# Patient Record
Sex: Male | Born: 1940
Health system: Southern US, Community
[De-identification: ages and names within clinical notes are randomized; demographics above are authoritative.]

## PROBLEM LIST (undated history)

## (undated) DIAGNOSIS — D709 Neutropenia, unspecified: Secondary | ICD-10-CM

## (undated) DIAGNOSIS — Z87898 Personal history of other specified conditions: Secondary | ICD-10-CM

## (undated) DIAGNOSIS — I10 Essential (primary) hypertension: Secondary | ICD-10-CM

## (undated) DIAGNOSIS — L409 Psoriasis, unspecified: Secondary | ICD-10-CM

## (undated) DIAGNOSIS — E079 Disorder of thyroid, unspecified: Secondary | ICD-10-CM

## (undated) DIAGNOSIS — D4620 Refractory anemia with excess of blasts, unspecified: Principal | ICD-10-CM

## (undated) DIAGNOSIS — E785 Hyperlipidemia, unspecified: Secondary | ICD-10-CM

## (undated) DIAGNOSIS — M1990 Unspecified osteoarthritis, unspecified site: Secondary | ICD-10-CM

## (undated) DIAGNOSIS — I639 Cerebral infarction, unspecified: Secondary | ICD-10-CM

## (undated) DIAGNOSIS — K219 Gastro-esophageal reflux disease without esophagitis: Secondary | ICD-10-CM

## (undated) HISTORY — DX: Cerebral infarction, unspecified: I63.9

## (undated) HISTORY — PX: CLEFT PALATE REPAIR: SUR1165

## (undated) HISTORY — DX: Hyperlipidemia, unspecified: E78.5

## (undated) HISTORY — DX: Psoriasis, unspecified: L40.9

## (undated) HISTORY — DX: Refractory anemia with excess of blasts, unspecified: D46.20

## (undated) HISTORY — DX: Neutropenia, unspecified: D70.9

## (undated) HISTORY — DX: Essential (primary) hypertension: I10

## (undated) HISTORY — DX: Unspecified osteoarthritis, unspecified site: M19.90

## (undated) HISTORY — DX: Gastro-esophageal reflux disease without esophagitis: K21.9

## (undated) HISTORY — DX: Personal history of other specified conditions: Z87.898

## (undated) HISTORY — DX: Disorder of thyroid, unspecified: E07.9

---

## 1994-03-27 DIAGNOSIS — I639 Cerebral infarction, unspecified: Secondary | ICD-10-CM

## 1994-03-27 HISTORY — DX: Cerebral infarction, unspecified: I63.9

## 1998-05-14 ENCOUNTER — Ambulatory Visit (HOSPITAL_COMMUNITY): Admission: RE | Admit: 1998-05-14 | Discharge: 1998-05-14 | Payer: Self-pay | Admitting: Gastroenterology

## 1998-05-14 ENCOUNTER — Encounter: Payer: Self-pay | Admitting: Gastroenterology

## 2001-05-03 ENCOUNTER — Encounter: Payer: Self-pay | Admitting: Endocrinology

## 2001-05-03 ENCOUNTER — Ambulatory Visit (HOSPITAL_COMMUNITY): Admission: RE | Admit: 2001-05-03 | Discharge: 2001-05-03 | Payer: Self-pay | Admitting: Endocrinology

## 2002-05-20 ENCOUNTER — Encounter: Payer: Self-pay | Admitting: Internal Medicine

## 2002-05-20 ENCOUNTER — Ambulatory Visit (HOSPITAL_COMMUNITY): Admission: RE | Admit: 2002-05-20 | Discharge: 2002-05-20 | Payer: Self-pay | Admitting: Internal Medicine

## 2003-08-27 ENCOUNTER — Ambulatory Visit (HOSPITAL_COMMUNITY): Admission: RE | Admit: 2003-08-27 | Discharge: 2003-08-27 | Payer: Self-pay | Admitting: Internal Medicine

## 2004-02-12 ENCOUNTER — Ambulatory Visit: Payer: Self-pay | Admitting: Internal Medicine

## 2004-04-05 ENCOUNTER — Ambulatory Visit: Payer: Self-pay | Admitting: Internal Medicine

## 2004-04-27 ENCOUNTER — Ambulatory Visit: Payer: Self-pay | Admitting: Internal Medicine

## 2004-05-11 ENCOUNTER — Ambulatory Visit: Payer: Self-pay | Admitting: Internal Medicine

## 2004-05-30 ENCOUNTER — Ambulatory Visit: Payer: Self-pay | Admitting: Internal Medicine

## 2004-06-13 ENCOUNTER — Ambulatory Visit: Payer: Self-pay | Admitting: Internal Medicine

## 2004-08-11 ENCOUNTER — Ambulatory Visit: Payer: Self-pay | Admitting: Internal Medicine

## 2004-09-22 ENCOUNTER — Ambulatory Visit: Payer: Self-pay | Admitting: Family Medicine

## 2004-10-04 ENCOUNTER — Ambulatory Visit: Payer: Self-pay | Admitting: Internal Medicine

## 2004-11-16 ENCOUNTER — Ambulatory Visit: Payer: Self-pay | Admitting: Internal Medicine

## 2005-02-02 ENCOUNTER — Ambulatory Visit: Payer: Self-pay | Admitting: Internal Medicine

## 2005-03-08 ENCOUNTER — Ambulatory Visit: Payer: Self-pay | Admitting: Internal Medicine

## 2005-04-25 ENCOUNTER — Ambulatory Visit: Payer: Self-pay | Admitting: Internal Medicine

## 2005-06-20 ENCOUNTER — Ambulatory Visit: Payer: Self-pay | Admitting: Internal Medicine

## 2005-10-03 ENCOUNTER — Ambulatory Visit: Payer: Self-pay | Admitting: Internal Medicine

## 2005-11-10 ENCOUNTER — Ambulatory Visit: Payer: Self-pay | Admitting: Internal Medicine

## 2005-12-05 ENCOUNTER — Ambulatory Visit: Payer: Self-pay | Admitting: Internal Medicine

## 2006-01-05 ENCOUNTER — Ambulatory Visit: Payer: Self-pay | Admitting: Internal Medicine

## 2006-02-07 ENCOUNTER — Ambulatory Visit: Payer: Self-pay | Admitting: Internal Medicine

## 2006-03-14 ENCOUNTER — Ambulatory Visit: Payer: Self-pay | Admitting: Internal Medicine

## 2006-04-03 ENCOUNTER — Ambulatory Visit: Payer: Self-pay | Admitting: Internal Medicine

## 2006-04-03 LAB — CONVERTED CEMR LAB
ALT: 30 units/L (ref 0–40)
AST: 31 units/L (ref 0–37)
Albumin: 3.7 g/dL (ref 3.5–5.2)
Alkaline Phosphatase: 70 units/L (ref 39–117)
BUN: 11 mg/dL (ref 6–23)
Basophils Absolute: 0 10*3/uL (ref 0.0–0.1)
Basophils Relative: 0.6 % (ref 0.0–1.0)
Bilirubin Urine: NEGATIVE
CO2: 32 meq/L (ref 19–32)
Calcium: 9.3 mg/dL (ref 8.4–10.5)
Chloride: 103 meq/L (ref 96–112)
Chol/HDL Ratio, serum: 3.6
Cholesterol: 129 mg/dL (ref 0–200)
Creatinine, Ser: 1.3 mg/dL (ref 0.4–1.5)
Eosinophil percent: 2.1 % (ref 0.0–5.0)
GFR calc non Af Amer: 59 mL/min
Glomerular Filtration Rate, Af Am: 71 mL/min/{1.73_m2}
Glucose, Bld: 113 mg/dL — ABNORMAL HIGH (ref 70–99)
HCT: 47.1 % (ref 39.0–52.0)
HDL: 35.4 mg/dL — ABNORMAL LOW (ref >39.0)
Hemoglobin, Urine: NEGATIVE
Hemoglobin: 15.7 g/dL (ref 13.0–17.0)
Ketones, ur: NEGATIVE mg/dL
LDL Cholesterol: 79 mg/dL (ref 0–99)
Leukocytes, UA: NEGATIVE
Lymphocytes Relative: 29.4 % (ref 12.0–46.0)
MCHC: 33.4 g/dL (ref 30.0–36.0)
MCV: 90.3 fL (ref 78.0–100.0)
Monocytes Absolute: 0.4 10*3/uL (ref 0.2–0.7)
Monocytes Relative: 8.5 % (ref 3.0–11.0)
Neutro Abs: 2.7 10*3/uL (ref 1.4–7.7)
Neutrophils Relative %: 59.4 % (ref 43.0–77.0)
Nitrite: NEGATIVE
PSA: 0.51 ng/mL
PSA: 0.51 ng/mL (ref 0.10–4.00)
Platelets: 195 10*3/uL (ref 150–400)
Potassium: 4.1 meq/L (ref 3.5–5.1)
RBC: 5.22 M/uL (ref 4.22–5.81)
RDW: 13.7 % (ref 11.5–14.6)
Sodium: 142 meq/L (ref 135–145)
Specific Gravity, Urine: 1.01 (ref 1.000–1.03)
TSH: 1.87 microintl units/mL (ref 0.35–5.50)
Total Bilirubin: 1.1 mg/dL (ref 0.3–1.2)
Total Protein, Urine: NEGATIVE mg/dL
Total Protein: 7.8 g/dL (ref 6.0–8.3)
Triglyceride fasting, serum: 74 mg/dL (ref 0–149)
Urine Glucose: NEGATIVE mg/dL
Urobilinogen, UA: 0.2 (ref 0.0–1.0)
VLDL: 15 mg/dL (ref 0–40)
WBC: 4.6 10*3/uL (ref 4.5–10.5)
pH: 6.5 (ref 5.0–8.0)

## 2006-04-10 ENCOUNTER — Ambulatory Visit: Payer: Self-pay | Admitting: Internal Medicine

## 2006-05-31 ENCOUNTER — Ambulatory Visit: Payer: Self-pay | Admitting: Internal Medicine

## 2006-07-11 ENCOUNTER — Ambulatory Visit: Payer: Self-pay | Admitting: Internal Medicine

## 2006-10-06 ENCOUNTER — Encounter: Payer: Self-pay | Admitting: Internal Medicine

## 2006-10-06 DIAGNOSIS — E119 Type 2 diabetes mellitus without complications: Secondary | ICD-10-CM | POA: Insufficient documentation

## 2006-10-06 DIAGNOSIS — E039 Hypothyroidism, unspecified: Secondary | ICD-10-CM | POA: Insufficient documentation

## 2006-10-06 DIAGNOSIS — I10 Essential (primary) hypertension: Secondary | ICD-10-CM

## 2006-10-06 DIAGNOSIS — K219 Gastro-esophageal reflux disease without esophagitis: Secondary | ICD-10-CM | POA: Insufficient documentation

## 2006-10-06 DIAGNOSIS — D649 Anemia, unspecified: Secondary | ICD-10-CM | POA: Insufficient documentation

## 2006-10-06 DIAGNOSIS — E785 Hyperlipidemia, unspecified: Secondary | ICD-10-CM | POA: Insufficient documentation

## 2006-10-18 ENCOUNTER — Ambulatory Visit: Payer: Self-pay | Admitting: Internal Medicine

## 2006-11-21 ENCOUNTER — Ambulatory Visit: Payer: Self-pay | Admitting: Internal Medicine

## 2007-01-03 ENCOUNTER — Ambulatory Visit: Payer: Self-pay | Admitting: Internal Medicine

## 2007-02-12 ENCOUNTER — Ambulatory Visit: Payer: Self-pay | Admitting: Internal Medicine

## 2007-02-12 DIAGNOSIS — E291 Testicular hypofunction: Secondary | ICD-10-CM | POA: Insufficient documentation

## 2007-04-08 ENCOUNTER — Ambulatory Visit: Payer: Self-pay | Admitting: Internal Medicine

## 2007-04-10 ENCOUNTER — Encounter: Payer: Self-pay | Admitting: Internal Medicine

## 2007-06-19 ENCOUNTER — Ambulatory Visit: Payer: Self-pay | Admitting: Internal Medicine

## 2007-06-24 ENCOUNTER — Telehealth: Payer: Self-pay | Admitting: Internal Medicine

## 2007-07-09 ENCOUNTER — Encounter: Payer: Self-pay | Admitting: Internal Medicine

## 2007-07-24 ENCOUNTER — Ambulatory Visit: Payer: Self-pay | Admitting: Internal Medicine

## 2007-09-05 ENCOUNTER — Ambulatory Visit: Payer: Self-pay | Admitting: Internal Medicine

## 2007-10-31 ENCOUNTER — Ambulatory Visit: Payer: Self-pay | Admitting: Internal Medicine

## 2007-11-01 ENCOUNTER — Emergency Department (HOSPITAL_COMMUNITY): Admission: EM | Admit: 2007-11-01 | Discharge: 2007-11-01 | Payer: Self-pay | Admitting: Emergency Medicine

## 2008-01-02 ENCOUNTER — Ambulatory Visit: Payer: Self-pay | Admitting: Internal Medicine

## 2008-02-26 ENCOUNTER — Ambulatory Visit: Payer: Self-pay | Admitting: Internal Medicine

## 2008-04-21 ENCOUNTER — Ambulatory Visit: Payer: Self-pay | Admitting: Internal Medicine

## 2008-04-22 ENCOUNTER — Ambulatory Visit: Payer: Self-pay | Admitting: Internal Medicine

## 2008-04-22 LAB — CONVERTED CEMR LAB
ALT: 54 units/L — ABNORMAL HIGH (ref 0–53)
AST: 45 units/L — ABNORMAL HIGH (ref 0–37)
Albumin: 4.3 g/dL (ref 3.5–5.2)
Alkaline Phosphatase: 72 units/L (ref 39–117)
BUN: 15 mg/dL (ref 6–23)
Basophils Absolute: 0 10*3/uL (ref 0.0–0.1)
Basophils Relative: 0.7 % (ref 0.0–3.0)
Bilirubin, Direct: 0.2 mg/dL (ref 0.0–0.3)
CO2: 34 meq/L — ABNORMAL HIGH (ref 19–32)
Calcium: 9.7 mg/dL (ref 8.4–10.5)
Chloride: 102 meq/L (ref 96–112)
Cholesterol: 134 mg/dL (ref 0–200)
Creatinine, Ser: 1.3 mg/dL (ref 0.4–1.5)
Eosinophils Absolute: 0.1 10*3/uL (ref 0.0–0.7)
Eosinophils Relative: 2.6 % (ref 0.0–5.0)
GFR calc Af Amer: 70 mL/min
GFR calc non Af Amer: 58 mL/min
Glucose, Bld: 123 mg/dL — ABNORMAL HIGH (ref 70–99)
HCT: 45.4 % (ref 39.0–52.0)
HDL: 34.5 mg/dL — ABNORMAL LOW (ref >39.0)
Hemoglobin: 15.6 g/dL (ref 13.0–17.0)
Hgb A1c MFr Bld: 6.3 % — ABNORMAL HIGH (ref 4.6–6.0)
LDL Cholesterol: 84 mg/dL (ref 0–99)
Lymphocytes Relative: 28 % (ref 12.0–46.0)
MCHC: 34.5 g/dL (ref 30.0–36.0)
MCV: 91.6 fL (ref 78.0–100.0)
Monocytes Absolute: 0.3 10*3/uL (ref 0.1–1.0)
Monocytes Relative: 7.6 % (ref 3.0–12.0)
Neutro Abs: 2.8 10*3/uL (ref 1.4–7.7)
Neutrophils Relative %: 61.1 % (ref 43.0–77.0)
PSA: 0.51 ng/mL (ref 0.10–4.00)
Platelets: 165 10*3/uL (ref 150–400)
Potassium: 4.2 meq/L (ref 3.5–5.1)
RBC: 4.95 M/uL (ref 4.22–5.81)
RDW: 12.5 % (ref 11.5–14.6)
Sodium: 140 meq/L (ref 135–145)
TSH: 4.67 microintl units/mL (ref 0.35–5.50)
Testosterone: 1211.96 ng/dL — ABNORMAL HIGH (ref 350.00–890)
Total Bilirubin: 1 mg/dL (ref 0.3–1.2)
Total CHOL/HDL Ratio: 3.9
Total Protein: 7.8 g/dL (ref 6.0–8.3)
Triglycerides: 78 mg/dL (ref 0–149)
VLDL: 16 mg/dL (ref 0–40)
WBC: 4.5 10*3/uL (ref 4.5–10.5)

## 2008-05-13 ENCOUNTER — Telehealth: Payer: Self-pay | Admitting: Internal Medicine

## 2008-08-04 ENCOUNTER — Ambulatory Visit: Payer: Self-pay | Admitting: Internal Medicine

## 2008-09-30 ENCOUNTER — Ambulatory Visit: Payer: Self-pay | Admitting: Internal Medicine

## 2008-11-26 ENCOUNTER — Ambulatory Visit: Payer: Self-pay | Admitting: Internal Medicine

## 2009-01-01 ENCOUNTER — Ambulatory Visit: Payer: Self-pay | Admitting: Internal Medicine

## 2009-02-09 ENCOUNTER — Ambulatory Visit: Payer: Self-pay | Admitting: Internal Medicine

## 2009-03-12 ENCOUNTER — Telehealth: Payer: Self-pay | Admitting: Internal Medicine

## 2009-03-15 ENCOUNTER — Encounter: Payer: Self-pay | Admitting: Internal Medicine

## 2009-03-23 ENCOUNTER — Ambulatory Visit: Payer: Self-pay | Admitting: Internal Medicine

## 2009-04-21 ENCOUNTER — Ambulatory Visit: Payer: Self-pay | Admitting: Internal Medicine

## 2009-05-10 ENCOUNTER — Ambulatory Visit: Payer: Self-pay | Admitting: Internal Medicine

## 2009-05-14 ENCOUNTER — Telehealth: Payer: Self-pay | Admitting: Internal Medicine

## 2009-05-14 LAB — CONVERTED CEMR LAB
ALT: 33 units/L (ref 0–53)
AST: 36 units/L (ref 0–37)
Albumin: 4.2 g/dL (ref 3.5–5.2)
Alkaline Phosphatase: 77 units/L (ref 39–117)
BUN: 13 mg/dL (ref 6–23)
Basophils Absolute: 0 10*3/uL (ref 0.0–0.1)
Basophils Relative: 0.9 % (ref 0.0–3.0)
Bilirubin, Direct: 0.2 mg/dL (ref 0.0–0.3)
CO2: 31 meq/L (ref 19–32)
Calcium: 9.3 mg/dL (ref 8.4–10.5)
Chloride: 99 meq/L (ref 96–112)
Cholesterol: 130 mg/dL (ref 0–200)
Creatinine, Ser: 1.3 mg/dL (ref 0.4–1.5)
Eosinophils Absolute: 0.1 10*3/uL (ref 0.0–0.7)
Eosinophils Relative: 1.9 % (ref 0.0–5.0)
Free T4: 0.7 ng/dL (ref 0.6–1.6)
GFR calc non Af Amer: 57.97 mL/min (ref >60)
Glucose, Bld: 100 mg/dL — ABNORMAL HIGH (ref 70–99)
HCT: 48 % (ref 39.0–52.0)
HDL: 37.9 mg/dL — ABNORMAL LOW (ref >39.00)
Hemoglobin: 15.9 g/dL (ref 13.0–17.0)
Hgb A1c MFr Bld: 6.3 % (ref 4.6–6.5)
LDL Cholesterol: 71 mg/dL (ref 0–99)
Lymphocytes Relative: 27.5 % (ref 12.0–46.0)
Lymphs Abs: 1.1 10*3/uL (ref 0.7–4.0)
MCHC: 33.1 g/dL (ref 30.0–36.0)
MCV: 94.6 fL (ref 78.0–100.0)
Monocytes Absolute: 0.3 10*3/uL (ref 0.1–1.0)
Monocytes Relative: 7.3 % (ref 3.0–12.0)
Neutro Abs: 2.6 10*3/uL (ref 1.4–7.7)
Neutrophils Relative %: 62.4 % (ref 43.0–77.0)
PSA: 0.73 ng/mL (ref 0.10–4.00)
Platelets: 195 10*3/uL (ref 150.0–400.0)
Potassium: 4.3 meq/L (ref 3.5–5.1)
RBC: 5.08 M/uL (ref 4.22–5.81)
RDW: 12.6 % (ref 11.5–14.6)
Sodium: 138 meq/L (ref 135–145)
TSH: 4.82 microintl units/mL (ref 0.35–5.50)
Total Bilirubin: 0.8 mg/dL (ref 0.3–1.2)
Total CHOL/HDL Ratio: 3
Total Protein: 7.5 g/dL (ref 6.0–8.3)
Triglycerides: 106 mg/dL (ref 0.0–149.0)
VLDL: 21.2 mg/dL (ref 0.0–40.0)
WBC: 4.1 10*3/uL — ABNORMAL LOW (ref 4.5–10.5)

## 2009-06-07 ENCOUNTER — Ambulatory Visit: Payer: Self-pay | Admitting: Internal Medicine

## 2009-08-03 ENCOUNTER — Ambulatory Visit: Payer: Self-pay | Admitting: Internal Medicine

## 2009-09-24 ENCOUNTER — Ambulatory Visit: Payer: Self-pay | Admitting: Internal Medicine

## 2009-11-19 ENCOUNTER — Ambulatory Visit: Payer: Self-pay | Admitting: Internal Medicine

## 2009-12-30 ENCOUNTER — Ambulatory Visit: Payer: Self-pay | Admitting: Internal Medicine

## 2009-12-30 DIAGNOSIS — K5732 Diverticulitis of large intestine without perforation or abscess without bleeding: Secondary | ICD-10-CM | POA: Insufficient documentation

## 2010-04-07 ENCOUNTER — Telehealth: Payer: Self-pay | Admitting: Internal Medicine

## 2010-04-12 ENCOUNTER — Ambulatory Visit: Admit: 2010-04-12 | Payer: Self-pay | Admitting: Internal Medicine

## 2010-04-12 ENCOUNTER — Ambulatory Visit
Admission: RE | Admit: 2010-04-12 | Discharge: 2010-04-12 | Payer: Self-pay | Source: Home / Self Care | Attending: Internal Medicine | Admitting: Internal Medicine

## 2010-04-12 DIAGNOSIS — R071 Chest pain on breathing: Secondary | ICD-10-CM | POA: Insufficient documentation

## 2010-04-24 LAB — CONVERTED CEMR LAB
ALT: 41 units/L (ref 0–53)
AST: 47 units/L — ABNORMAL HIGH (ref 0–37)
Albumin: 4.5 g/dL (ref 3.5–5.2)
Alkaline Phosphatase: 79 units/L (ref 39–117)
BUN: 16 mg/dL (ref 6–23)
Basophils Absolute: 0 10*3/uL (ref 0.0–0.1)
Basophils Relative: 0 % (ref 0.0–1.0)
Bilirubin, Direct: 0.2 mg/dL (ref 0.0–0.3)
CO2: 34 meq/L — ABNORMAL HIGH (ref 19–32)
Calcium: 9.6 mg/dL (ref 8.4–10.5)
Chloride: 99 meq/L (ref 96–112)
Cholesterol: 138 mg/dL (ref 0–200)
Creatinine, Ser: 1.3 mg/dL (ref 0.4–1.5)
Eosinophils Absolute: 0.2 10*3/uL (ref 0.0–0.6)
Eosinophils Relative: 3.4 % (ref 0.0–5.0)
Folate: >20 ng/mL
GFR calc Af Amer: 71 mL/min
GFR calc non Af Amer: 58 mL/min
Glucose, Bld: 84 mg/dL (ref 70–99)
HCT: 43.9 % (ref 39.0–52.0)
HDL: 34.5 mg/dL — ABNORMAL LOW (ref >39.0)
Hemoglobin: 15.2 g/dL (ref 13.0–17.0)
Hgb A1c MFr Bld: 6.4 % — ABNORMAL HIGH (ref 4.6–6.0)
LDL Cholesterol: 82 mg/dL (ref 0–99)
Lymphocytes Relative: 26.1 % (ref 12.0–46.0)
MCHC: 34.6 g/dL (ref 30.0–36.0)
MCV: 89.7 fL (ref 78.0–100.0)
Monocytes Absolute: 0.5 10*3/uL (ref 0.2–0.7)
Monocytes Relative: 7.7 % (ref 3.0–11.0)
Neutro Abs: 3.7 10*3/uL (ref 1.4–7.7)
Neutrophils Relative %: 62.8 % (ref 43.0–77.0)
Platelets: 186 10*3/uL (ref 150–400)
Potassium: 4.4 meq/L (ref 3.5–5.1)
RBC: 4.89 M/uL (ref 4.22–5.81)
RDW: 12.7 % (ref 11.5–14.6)
Sodium: 140 meq/L (ref 135–145)
TSH: 2.54 microintl units/mL (ref 0.35–5.50)
Total Bilirubin: 1.3 mg/dL — ABNORMAL HIGH (ref 0.3–1.2)
Total CHOL/HDL Ratio: 4
Total Protein: 7.9 g/dL (ref 6.0–8.3)
Triglycerides: 110 mg/dL (ref 0–149)
VLDL: 22 mg/dL (ref 0–40)
Vitamin B-12: 359 pg/mL (ref 211–911)
WBC: 5.9 10*3/uL (ref 4.5–10.5)

## 2010-04-28 NOTE — Assessment & Plan Note (Signed)
Summary: BACK/SWOLLEN AREA  STC   Vital Signs:  Patient profile:   70 year old male Height:      68 inches Weight:      202 pounds BMI:     30.83 O2 Sat:      96 % on Room air Temp:     97.4 degrees F oral Pulse rate:   62 / minute BP sitting:   120 / 78  (left arm) Cuff size:   regular  Vitals Entered By: Bill Salinas CMA (April 12, 2010 2:02 PM)  O2 Flow:  Room air CC: Trevor Cooke here with c/o swelling under his right shoulder blade/ ab   Primary Care Provider:  Lovell Nuttall  CC:  Trevor Cooke here with c/o swelling under his right shoulder blade/ ab.  History of Present Illness: Patient presents for evaluation of a lump/bump at the right back at the scapular region. He reports no injury or strain. The are is tender. He has no respiratory trouble: no wheezing, no SOB. He has had no fever,sweats or chills. He is otherwise feeling well.   Current Medications (verified): 1)  Vitamin C 500 Mg  Tabs (Ascorbic Acid) .... Take 1 Tablet By Mouth Once A Day 2)  Calcium 600+d 600-400 Mg-Unit Tabs (Calcium Carbonate-Vitamin D) .... Take 1 Tablet By Mouth Two Times A Day 3)  Centrum Silver   Tabs (Multiple Vitamins-Minerals) .Marland Kitchen.. 1 Qd 4)  Labetalol Hcl 100 Mg  Tabs (Labetalol Hcl) .Marland Kitchen.. 1 Bid 5)  Adult Aspirin Ec Low Strength 81 Mg .Marland Kitchen.. 1 Qd 6)  Furosemide 20 Mg  Tabs (Furosemide) .Marland Kitchen.. 1 Qd 7)  Prilosec 20 Mg  Otc .Marland Kitchen.. 1 Qd 8)  Lovastatin 40 Mg  Tabs (Lovastatin) .Marland Kitchen.. 1 Qd 9)  Delatestryl 200 Mg/ml  Oil (Testosterone Enanthate) .... 400mg /month 10)  Enalapril Maleate 10 Mg  Tabs (Enalapril Maleate) .Marland Kitchen.. 1 Qd 11)  Cialis 10 Mg  Tabs (Tadalafil) .... Take As Needed 12)  Dovonex 0.005 %  Crea (Calcipotriene) .... As Needed 13)  Nasaflo Neti Pot Nasal Wash   Pack (Hypertonic Nasal Wash) .... As Needed 14)  Nifedical Xl 60 Mg  Tb24 (Nifedipine) .... Take 1 Tablet By Mouth Once A Day 15)  Flonase 50 Mcg/act Susp (Fluticasone Propionate) .... Instill 2 Sprays Each Nostril Once Daily 16)  Testosterone Cypionate  200 Mg/ml Oil (Testosterone Cypionate) .... 2 Cc (400mg ) Q Mth 17)  Amoxicillin-Pot Clavulanate 875-125 Mg Tabs (Amoxicillin-Pot Clavulanate) .Marland Kitchen.. 1 By Mouth Two Times A Day For Diverticulitis  Allergies (verified): No Known Drug Allergies  Past History:  Past Medical History: Last updated: 10/06/2006 Anemia-NOS Diabetes mellitus, type II GERD Hyperlipidemia Hypertension Hypothyroidism Cleft palet Colon polyps CVA Esophageal Strictures r Thyroid cyst Psoriasis Chronic hearing loss Hypogonadism DJD of Cervical Spine  Past Surgical History: Last updated: 10/06/2006 EGD "34" FH reviewed for relevance, SH/Risk Factors reviewed for relevance  Review of Systems  The patient denies anorexia, fever, weight loss, weight gain, chest pain, dyspnea on exertion, prolonged cough, suspicious skin lesions, depression, enlarged lymph nodes, and angioedema.    Physical Exam  General:  Well-developed,well-nourished,in no acute distress; alert,appropriate and cooperative throughout examination Neck:  supple and full ROM.   Chest Wall:  careful palpation reveals no nodules, lumps or other defects. There is tenderness in the intercostal space.  Lungs:  normal respiratory effort and normal breath sounds.   Heart:  normal rate and regular rhythm.   Msk:  normal ROM.   Skin:  turgor normal, color normal,  no rashes, and no suspicious lesions.   Cervical Nodes:  no anterior cervical adenopathy and no posterior cervical adenopathy.     Impression & Recommendations:  Problem # 1:  CHEST WALL PAIN, ACUTE (ICD-786.52) no abnormality on exam. CXR was negative. Most likely diagnosis is intercostal muscle strain.  Orders: T-2 View CXR (71020TC)  Complete Medication List: 1)  Vitamin C 500 Mg Tabs (Ascorbic acid) .... Take 1 tablet by mouth once a day 2)  Calcium 600+d 600-400 Mg-unit Tabs (Calcium carbonate-vitamin d) .... Take 1 tablet by mouth two times a day 3)  Centrum Silver Tabs  (Multiple vitamins-minerals) .Marland Kitchen.. 1 qd 4)  Labetalol Hcl 100 Mg Tabs (Labetalol hcl) .Marland Kitchen.. 1 bid 5)  Adult Aspirin Ec Low Strength 81 Mg  .Marland Kitchen.. 1 qd 6)  Furosemide 20 Mg Tabs (Furosemide) .Marland Kitchen.. 1 qd 7)  Prilosec 20 Mg Otc  .Marland Kitchen.. 1 qd 8)  Lovastatin 40 Mg Tabs (Lovastatin) .Marland Kitchen.. 1 qd 9)  Delatestryl 200 Mg/ml Oil (Testosterone enanthate) .... 400mg /month 10)  Enalapril Maleate 10 Mg Tabs (Enalapril maleate) .Marland Kitchen.. 1 qd 11)  Cialis 10 Mg Tabs (Tadalafil) .... Take as needed 12)  Dovonex 0.005 % Crea (Calcipotriene) .... As needed 13)  Nasaflo Neti Pot Nasal Wash Pack (Hypertonic nasal wash) .... As needed 14)  Nifedical Xl 60 Mg Tb24 (Nifedipine) .... Take 1 tablet by mouth once a day 15)  Flonase 50 Mcg/act Susp (Fluticasone propionate) .... Instill 2 sprays each nostril once daily 16)  Testosterone Cypionate 200 Mg/ml Oil (Testosterone cypionate) .... 2 cc (400mg ) q mth 17)  Amoxicillin-pot Clavulanate 875-125 Mg Tabs (Amoxicillin-pot clavulanate) .Marland Kitchen.. 1 by mouth two times a day for diverticulitis  Other Orders: Pneumococcal Vaccine (60454) Admin 1st Vaccine (09811)   Orders Added: 1)  T-2 View CXR [71020TC] 2)  Pneumococcal Vaccine [90732] 3)  Admin 1st Vaccine [90471] 4)  Est. Patient Level III [91478]   Immunizations Administered:  Pneumonia Vaccine:    Vaccine Type: Pneumovax    Site: left deltoid    Mfr: Merck    Dose: 0.5 ml    Route: IM    Given by: Ami Bullins CMA    Exp. Date: 08/19/2011    Lot #: [id number]    VIS given: 03/01/09 version given April 12, 2010.   Immunizations Administered:  Pneumonia Vaccine:    Vaccine Type: Pneumovax    Site: left deltoid    Mfr: Merck    Dose: 0.5 ml    Route: IM    Given by: Ami Bullins CMA    Exp. Date: 08/19/2011    Lot #: [id number]    VIS given: 03/01/09 version given April 12, 2010.

## 2010-04-28 NOTE — Assessment & Plan Note (Signed)
Summary: testosterone shot/men/cd  Nurse Visit   Vitals Entered By: Lamar Sprinkles, CMA (Aug 03, 2009 9:31 AM)  Allergies: No Known Drug Allergies  Medication Administration  Injection # 1:    Medication: Testosterone Cypionat 200mg  ing    Diagnosis: IMPOTENCE (ICD-302.72)    Route: IM    Site: LUOQ gluteus    Exp Date: 04/28/2011    Lot #: [id number]    Comments: 2cc    Patient tolerated injection without complications    Given by: Lamar Sprinkles, CMA (Aug 03, 2009 9:32 AM)  Orders Added: 1)  Admin of patients own med IM/SQ [96372M]   Medication Administration  Injection # 1:    Medication: Testosterone Cypionat 200mg  ing    Diagnosis: IMPOTENCE (ICD-302.72)    Route: IM    Site: LUOQ gluteus    Exp Date: 04/28/2011    Lot #: [id number]    Comments: 2cc    Patient tolerated injection without complications    Given by: Lamar Sprinkles, CMA (Aug 03, 2009 9:32 AM)  Orders Added: 1)  Admin of patients own med IM/SQ [phone]

## 2010-04-28 NOTE — Progress Notes (Signed)
  Phone Note Refill Request   Refills Requested: Medication #1:  NIFEDICAL XL 60 MG  TB24 Take 1 tablet by mouth once a day  Medication #2:  FLONASE 50 MCG/ACT SUSP Instill 2 sprays each nostril once daily pt states he did not get handwritten perscriptions,  I will fax these in to [facility].  Initial call taken by: Ami Bullins CMA,  May 14, 2009 11:34 AM    Prescriptions: FLONASE 50 MCG/ACT SUSP (FLUTICASONE PROPIONATE) Instill 2 sprays each nostril once daily  #3 x 3   Entered by:   Ami Bullins CMA   Authorized by:   Jacques Navy MD   Signed by:   Bill Salinas CMA on 05/14/2009   Method used:   Faxed to ...       [facility] (mail-order)       [address] [place], [place]  [address]       Ph: [phone]       Fax: [phone]   RxID:   [id number] NIFEDICAL XL 60 MG  TB24 (NIFEDIPINE) Take 1 tablet by mouth once a day  #90 x 3   Entered by:   Ami Bullins CMA   Authorized by:   Jacques Navy MD   Signed by:   Bill Salinas CMA on 05/14/2009   Method used:   Faxed to ...       [facility] (mail-order)       [address] [place], [place]  [address]       Ph: [phone]       Fax: [phone]   RxID:   [id number]

## 2010-04-28 NOTE — Miscellaneous (Signed)
Summary: [facility] RELEASE/[facility]  [facility] RELEASE/[facility]   Imported By: Lester Montverde 06/10/2009 08:03:55  _____________________________________________________________________  External Attachment:    Type:   Image     Comment:   External Document

## 2010-04-28 NOTE — Assessment & Plan Note (Signed)
Summary: YEARLY---STC   Vital Signs:  Patient profile:   70 year old male Height:      68 inches Weight:      202 pounds BMI:     30.83 O2 Sat:      97 % on Room air Temp:     97.6 degrees F oral Pulse rate:   64 / minute BP sitting:   124 / 82  (left arm) Cuff size:   large  Vitals Entered By: Bill Salinas CMA (May 10, 2009 1:32 PM)  O2 Flow:  Room air CC: cpx/ ab  Vision Screening:      Vision Comments: Pt had an eye exam early Feb with Dr Drema Pry at [facility], there were changes in his right eye. Pt was given contacts to try.  Vision Entered By: Bill Salinas CMA (May 10, 2009 1:36 PM)   Primary Care Provider:  Ashleen Demma  CC:  cpx/ ab.  History of Present Illness: Patinet presents for routime medical follow-up. He has no specific complaints.  He has maaintained his current medication regimen. He remains physically active.  Current Medications (verified): 1)  Vitamin C 500 Mg  Tabs (Ascorbic Acid) .... Take 1 Tablet By Mouth Once A Day 2)  Calcium 600+d 600-400 Mg-Unit Tabs (Calcium Carbonate-Vitamin D) .... Take 1 Tablet By Mouth Two Times A Day 3)  Centrum Silver   Tabs (Multiple Vitamins-Minerals) .Marland Kitchen.. 1 Qd 4)  Labetalol Hcl 100 Mg  Tabs (Labetalol Hcl) .Marland Kitchen.. 1 Bid 5)  Adult Aspirin Ec Low Strength 81 Mg .Marland Kitchen.. 1 Qd 6)  Furosemide 20 Mg  Tabs (Furosemide) .Marland Kitchen.. 1 Qd 7)  Prilosec 20 Mg  Otc .Marland Kitchen.. 1 Qd 8)  Lovastatin 40 Mg  Tabs (Lovastatin) .Marland Kitchen.. 1 Qd 9)  Delatestryl 200 Mg/ml  Oil (Testosterone Enanthate) .... 400mg /month 10)  Enalapril Maleate 10 Mg  Tabs (Enalapril Maleate) .Marland Kitchen.. 1 Qd 11)  Cialis 10 Mg  Tabs (Tadalafil) .... Take As Needed 12)  Dovonex 0.005 %  Crea (Calcipotriene) .... As Needed 13)  Nasaflo Neti Pot Nasal Wash   Pack (Hypertonic Nasal Wash) .... As Needed 14)  Nifedical Xl 60 Mg  Tb24 (Nifedipine) .... Take 1 Tablet By Mouth Once A Day 15)  Flonase 50 Mcg/act Susp (Fluticasone Propionate) .... Instill 2 Sprays Each Nostril Once Daily 16)   Testosterone Cypionate 200 Mg/ml Oil (Testosterone Cypionate) .... 2 Cc (400mg ) Q Mth  Allergies (verified): No Known Drug Allergies  Past History:  Past Medical History: Last updated: 10/06/2006 Anemia-NOS Diabetes mellitus, type II GERD Hyperlipidemia Hypertension Hypothyroidism Cleft palet Colon polyps CVA Esophageal Strictures r Thyroid cyst Psoriasis Chronic hearing loss Hypogonadism DJD of Cervical Spine  Past Surgical History: Last updated: 10/06/2006 EGD "98"  Family History: Last updated: May 01, 2008 father- deceased @ 31: DM, gout, CAD mother-deceased #@ 1: no chronic disease recorded Neg - colon, prostate cancer  Social History: Last updated: 01-May-2008 HS[employer], [place] - BS Patent attorney, MS computer science married '63 3 sons - ''57, '67, '80 middle  son is a cardiologist in [place]; youngest a lawye; 9 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, adult company marriage is in good health I-ADLs  Risk Factors: Alcohol Use: 1 (04/08/2007) Caffeine Use: 2 (05/01/08) Exercise: no (04/08/2007)  Risk Factors: Smoking Status: quit (10/06/2006)  Review of Systems  The patient denies anorexia, fever, weight loss, weight gain, decreased hearing, hoarseness, chest pain, dyspnea on exertion, prolonged cough, hemoptysis, abdominal pain, severe indigestion/heartburn, muscle weakness, transient blindness, depression, enlarged lymph nodes, and angioedema.  Physical Exam  General:  WNWD white male with no distress or discomfort Head:  Normocephalic and atraumatic without obvious abnormalities. No apparent alopecia or balding. Eyes:  vision grossly intact, pupils equal, pupils round, corneas and lenses clear, and no injection.   Ears:  R ear normal and L ear normal.   Nose:  no external deformity and no external erythema.   Mouth:  preserved dentition, no buccal or palatal lesions. Lip with surgical scar from corrective procedure as a youth. Poterior pharynx  clear Neck:  supple, no thyromegaly, and no carotid bruits.   Chest Wall:  No deformities, masses, tenderness or gynecomastia noted. Lungs:  Normal respiratory effort, chest expands symmetrically. Lungs are clear to auscultation, no crackles or wheezes. Heart:  Normal rate and regular rhythm. S1 and S2 normal without gallop, murmur, click, rub or other extra sounds. Abdomen:  soft, non-tender, no distention, no masses, no guarding, and no hepatomegaly.   Rectal:  No external abnormalities noted. Normal sphincter tone. No rectal masses or tenderness. Prostate:  Prostate gland firm and smooth, no enlargement, nodularity, tenderness, mass, asymmetry or induration. Msk:  normal ROM, no joint tenderness, no joint swelling, no joint warmth, no redness over joints, and no joint deformities.   Pulses:  2+ radial and DP pulses Extremities:  No clubbing, cyanosis, edema, or deformity noted with normal full range of motion of all joints.   Neurologic:  alert & oriented X3, cranial nerves II-XII intact, strength normal in all extremities, sensation intact to light touch, gait normal, and DTRs symmetrical and normal.   Skin:  Intact without suspicious lesions or rashes Cervical Nodes:  no anterior cervical adenopathy and no posterior cervical adenopathy.   Inguinal Nodes:  no R inguinal adenopathy and no L inguinal adenopathy.   Psych:  Oriented X3, memory intact for recent and remote, normally interactive, and good eye contact.     Impression & Recommendations:  Problem # 1:  IMPOTENCE (ICD-302.72) Continues with testosterone injections which have made a difference. He also gets good results with cialis.  Plan - Rx renewed.  His updated medication list for this problem includes:    Cialis 10 Mg Tabs (Tadalafil) .Marland Kitchen... Take as needed  Orders: TLB-PSA (Prostate Specific Antigen) (84153-PSA)  Problem # 2:  HYPOTHYROIDISM (ICD-244.9) For lab with recommendations to follow  Orders: TLB-TSH (Thyroid  Stimulating Hormone) (84443-TSH) TLB-T4 (Thyrox), Free [phone])  Addendum - thyroid functions normal  Problem # 3:  HYPERTENSION (ICD-401.9)  His updated medication list for this problem includes:    Labetalol Hcl 100 Mg Tabs (Labetalol hcl) .Marland Kitchen... 1 bid    Furosemide 20 Mg Tabs (Furosemide) .Marland Kitchen... 1 qd    Enalapril Maleate 10 Mg Tabs (Enalapril maleate) .Marland Kitchen... 1 qd    Nifedical Xl 60 Mg Tb24 (Nifedipine) .Marland Kitchen... Take 1 tablet by mouth once a day  Orders: Prescription Created Electronically [phone]) TLB-BMP (Basic Metabolic Panel-BMET) (80048-METABOL)  BP today: 124/82 Prior BP: 128/68 (04/21/2008)  Good control on present medications. routine lab ordered.  Plan - continue present regimen  Problem # 4:  HYPERLIPIDEMIA (ICD-272.4) Routine follow-up lab ordered with recommnedations to follow  His updated medication list for this problem includes:    Lovastatin 40 Mg Tabs (Lovastatin) .Marland Kitchen... 1 qd  Orders: Prescription Created Electronically [phone]) TLB-Lipid Panel (80061-LIPID) TLB-Hepatic/Liver Function Pnl (80076-HEPATIC)  Addendum - labs reveal great control. Continue present meds  Problem # 5:  GERD (ICD-530.81) Symptoms are well controlled.  Problem # 6:  DIABETES MELLITUS, TYPE II (ICD-250.00)  A1C ordered and recommendations to follow.  His updated medication list for this problem includes:    Enalapril Maleate 10 Mg Tabs (Enalapril maleate) .Marland Kitchen... 1 qd  Orders: Prescription Created Electronically [phone]) TLB-A1C / Hgb A1C (Glycohemoglobin) (83036-A1C)  Addendum - A1C 6.3%  Continue diet management alone.  Problem # 7:  ANEMIA-NOS (ICD-285.9) Asmptomatic. CBC ordered.  Orders: TLB-CBC Platelet - w/Differential (85025-CBCD)  Addendum - lab is ok  Problem # 8:  Preventive Health Care (ICD-V70.0) Assessment: Unchanged Unremarkable history and normal physical exam. He will return in the AM for lab. He is current with colorectal cancer screening and prostate  cancer screening by [facility] with PSA pending. Current with tetnus immunization and is a candidate for pneumonia vaccine and shingles vaccine.   In summary - a very nice man with multiple medical problems that all seem stable at this time.   Complete Medication List: 1)  Vitamin C 500 Mg Tabs (Ascorbic acid) .... Take 1 tablet by mouth once a day 2)  Calcium 600+d 600-400 Mg-unit Tabs (Calcium carbonate-vitamin d) .... Take 1 tablet by mouth two times a day 3)  Centrum Silver Tabs (Multiple vitamins-minerals) .Marland Kitchen.. 1 qd 4)  Labetalol Hcl 100 Mg Tabs (Labetalol hcl) .Marland Kitchen.. 1 bid 5)  Adult Aspirin Ec Low Strength 81 Mg  .Marland Kitchen.. 1 qd 6)  Furosemide 20 Mg Tabs (Furosemide) .Marland Kitchen.. 1 qd 7)  Prilosec 20 Mg Otc  .Marland Kitchen.. 1 qd 8)  Lovastatin 40 Mg Tabs (Lovastatin) .Marland Kitchen.. 1 qd 9)  Delatestryl 200 Mg/ml Oil (Testosterone enanthate) .... 400mg /month 10)  Enalapril Maleate 10 Mg Tabs (Enalapril maleate) .Marland Kitchen.. 1 qd 11)  Cialis 10 Mg Tabs (Tadalafil) .... Take as needed 12)  Dovonex 0.005 % Crea (Calcipotriene) .... As needed 13)  Nasaflo Neti Pot Nasal Wash Pack (Hypertonic nasal wash) .... As needed 14)  Nifedical Xl 60 Mg Tb24 (Nifedipine) .... Take 1 tablet by mouth once a day 15)  Flonase 50 Mcg/act Susp (Fluticasone propionate) .... Instill 2 sprays each nostril once daily 16)  Testosterone Cypionate 200 Mg/ml Oil (Testosterone cypionate) .... 2 cc (400mg ) q mth   Patient: Trevor Cooke Note: All result statuses are Final unless otherwise noted.  Tests: (1) TSH (TSH)   FastTSH                   4.82 uIU/mL                 0.35-5.50  Tests: (2) T4, Free (FT4R)   Free T4                   0.7 ng/dL                   8.2-9.9  Tests: (3) BMP (METABOL)   Sodium                    138 mEq/L                   135-145   Potassium                 4.3 mEq/L                   3.5-5.1   Chloride                  99 mEq/L                    96-112  Carbon Dioxide            31 mEq/L                    19-32    Glucose              [H]  100 mg/dL                   29-56   BUN                       13 mg/dL                    2-13   Creatinine                1.3 mg/dL                   0.8-6.5   Calcium                   9.3 mg/dL                   7.8-46.9   GFR                       57.97 mL/min                >60  Tests: (4) Lipid Panel (LIPID)   Cholesterol               130 mg/dL                   6-295     ATP III Classification            Desirable:  < 200 mg/dL                    Borderline High:  200 - 239 mg/dL               High:  > = 240 mg/dL   Triglycerides             106.0 mg/dL                 2.8-413.2     Normal:  <150 mg/dL     Borderline High:  440 - 199 mg/dL   HDL                  [L]  10.27 mg/dL                 >25.36   VLDL Cholesterol          21.2 mg/dL                  6.4-40.3   LDL Cholesterol           71 mg/dL                    4-74  CHO/HDL Ratio:  CHD Risk                             3                    Men          Women     1/2 Average Risk     3.4  3.3     Average Risk          5.0          4.4     2X Average Risk          9.6          7.1     3X Average Risk          15.0          11.0                           Tests: (5) Hepatic/Liver Function Panel (HEPATIC)   Total Bilirubin           0.8 mg/dL                   1.6-1.0   Direct Bilirubin          0.2 mg/dL                   9.6-0.4   Alkaline Phosphatase      77 U/L                      39-117   AST                       36 U/L                      0-37   ALT                       33 U/L                      0-53   Total Protein             7.5 g/dL                    5.4-0.9   Albumin                   4.2 g/dL                    8.1-1.9  Tests: (6) Hemoglobin A1C (A1C)   Hemoglobin A1C            6.3 %                       4.6-6.5     Glycemic Control Guidelines for People with Diabetes:     Non Diabetic:  <6%     Goal of Therapy: <7%     Additional Action Suggested:  >8%    Tests: (7) CBC Platelet w/Diff (CBCD)   White Cell Count     [L]  4.1 K/uL                    4.5-10.5   Red Cell Count            5.08 Mil/uL                 4.22-5.81   Hemoglobin                15.9 g/dL                   14.7-82.9   Hematocrit  48.0 %                      39.0-52.0   MCV                       94.6 fl                     78.0-100.0   MCHC                      33.1 g/dL                   96.2-95.2   RDW                       12.6 %                      11.5-14.6   Platelet Count            195.0 K/uL                  150.0-400.0   Neutrophil %              62.4 %                      43.0-77.0   Lymphocyte %              27.5 %                      12.0-46.0   Monocyte %                7.3 %                       3.0-12.0   Eosinophils%              1.9 %                       0.0-5.0   Basophils %               0.9 %                       0.0-3.0   Neutrophill Absolute      2.6 K/uL                    1.4-7.7   Lymphocyte Absolute       1.1 K/uL                    0.7-4.0   Monocyte Absolute         0.3 K/uL                    0.1-1.0  Eosinophils, Absolute                             0.1 K/uL                    0.0-0.7   Basophils Absolute        0.0 K/uL                    0.0-0.1  Tests: (8) Prostate Specific Antigen (  PSA)   PSA-Hyb                   0.73 ng/mL                  0.10-4.00Prescriptions: NIFEDICAL XL 60 MG  TB24 (NIFEDIPINE) Take 1 tablet by mouth once a day  #90 x 3   Entered and Authorized by:   Jacques Navy MD   Signed by:   Jacques Navy MD on 05/10/2009   Method used:   Handwritten   RxID:   [id number] DELATESTRYL 200 MG/ML  OIL (TESTOSTERONE ENANTHATE) 400mg /month  #10cc x 3   Entered and Authorized by:   Jacques Navy MD   Signed by:   Jacques Navy MD on 05/10/2009   Method used:   Handwritten   RxID:   [id number] FLONASE 50 MCG/ACT SUSP (FLUTICASONE PROPIONATE) Instill 2 sprays each nostril once  daily  #3 x 3   Entered and Authorized by:   Jacques Navy MD   Signed by:   Jacques Navy MD on 05/10/2009   Method used:   Electronically to        [facility] [phone]* (retail)       [address]       [place], [place]  [address]       Ph: [phone]       Fax: [phone]   RxID:   [id number] CIALIS 10 MG  TABS (TADALAFIL) take as needed  #12 x 3   Entered and Authorized by:   Jacques Navy MD   Signed by:   Jacques Navy MD on 05/10/2009   Method used:   Electronically to        [facility] [phone]* (retail)       [address]       [place], [place]  [address]       Ph: [phone]       Fax: [phone]   RxID:   [id number] ENALAPRIL MALEATE 10 MG  TABS (ENALAPRIL MALEATE) 1 qd  #90 x 3   Entered and Authorized by:   Jacques Navy MD   Signed by:   Jacques Navy MD on 05/10/2009   Method used:   Electronically to        [facility] [phone]* (retail)       [address]       [place], [place]  [address]       Ph: [phone]       Fax: [phone]   RxID:   [id number] LOVASTATIN 40 MG  TABS (LOVASTATIN) 1 qd  #90 x 3   Entered and Authorized by:   Jacques Navy MD   Signed by:   Jacques Navy MD on 05/10/2009   Method used:   Electronically to        [facility] [phone]* (retail)       [address]       [place], [place]  [address]       Ph: [phone]       Fax: [phone]   RxID:   [id number] FUROSEMIDE 20 MG  TABS (FUROSEMIDE) 1 qd  #90 x 3   Entered and  Authorized by:   Jacques Navy MD   Signed by:   Jacques Navy MD on 05/10/2009   Method used:   Electronically to        [facility] [phone]* (retail)       [address]       [place], [place]  [address]       Ph: [phone]       Fax: [phone]   RxID:    [id number] LABETALOL HCL 100 MG  TABS (LABETALOL HCL) 1 BID  #180 x 3   Entered and Authorized by:   Jacques Navy MD   Signed by:   Jacques Navy MD on 05/10/2009   Method used:   Electronically to        [facility] [phone]* (retail)       [address]       [place], [place]  [address]       Ph: [phone]       Fax: [phone]   RxID:   [id number]

## 2010-04-28 NOTE — Medication Information (Signed)
Summary: [facility] for [facility]ryl[facility]  [facility]   Imported By: Maryln Gottron 03/30/2009 11:29:26  _____________________________________________________________________  External Attachment:    Type:   Image     Comment:   External Document

## 2010-04-28 NOTE — Assessment & Plan Note (Signed)
Summary: TESTOSTERONE/ MEN /NWS  Nurse Visit   Allergies: No Known Drug Allergies  Medication Administration  Injection # 1:    Medication: Testosterone Cypionat 200mg  ing    Diagnosis: IMPOTENCE (ICD-302.72)    Route: IM    Site: LUOQ gluteus    Exp Date: 04/28/2011    Lot #: [id number]    Mfr: watson    Comments: Patient receives 2cc each visit    Patient tolerated injection without complications    Given by: Lucious Groves (April 21, 2009 8:50 AM)  Orders Added: 1)  Testosterone Cypionat 200mg  ing [J1080] 2)  Admin of patients own med IM/SQ [32440N]

## 2010-04-28 NOTE — Assessment & Plan Note (Signed)
Summary: testosterone injection-lb  Nurse Visit   Allergies: No Known Drug Allergies  Medication Administration  Injection # 1:    Medication: Testosterone Cypionat 200mg  ing    Diagnosis: IMPOTENCE (ICD-302.72)    Route: IM    Site: RUOQ gluteus    Exp Date: 11/26/2010    Lot #: [id number]    Mfr: watson    Patient tolerated injection without complications    Given by: Lucious Groves (June 07, 2009 2:15 PM)  Injection # 2:    Medication: Testosterone Cypionat 200mg  ing    Diagnosis: IMPOTENCE (ICD-302.72)    Route: IM    Site: RUOQ gluteus    Exp Date: 04/28/2011    Lot #: [id number]    Mfr: Claudette Laws    Patient tolerated injection without complications    Given by: Lucious Groves (June 07, 2009 2:17 PM)  Orders Added: 1)  Testosterone Cypionat 200mg  ing [J1080] 2)  Admin of patients own med IM/SQ [13086V]

## 2010-04-28 NOTE — Assessment & Plan Note (Signed)
Summary: testosterone shot   Nurse Visit   Allergies: No Known Drug Allergies  Medication Administration  Injection # 1:    Medication: Testosterone Cypionat 200mg  ing    Diagnosis: IMPOTENCE (ICD-302.72)    Route: IM    Site: LUOQ gluteus    Exp Date: 04/2011    Lot #: [id number]    Mfr: Abraxis    Patient tolerated injection without complications    Given by: Rock Nephew CMA (November 19, 2009 1:47 PM)  Orders Added: 1)  Testosterone Cypionat 200mg  ing [J1080]   Medication Administration  Injection # 1:    Medication: Testosterone Cypionat 200mg  ing    Diagnosis: IMPOTENCE (ICD-302.72)    Route: IM    Site: LUOQ gluteus    Exp Date: 04/2011    Lot #: [id number]    Mfr: Abraxis    Patient tolerated injection without complications    Given by: Rock Nephew CMA (November 19, 2009 1:47 PM)  Orders Added: 1)  Testosterone Cypionat 200mg  ing [J1080]

## 2010-04-28 NOTE — Assessment & Plan Note (Signed)
Summary: testosterone shot/#/cd  Nurse Visit   Vitals Entered ByBill Salinas CMA (September 24, 2009 8:41 AM)  Allergies: No Known Drug Allergies  Medication Administration  Injection # 1:    Medication: Testosterone Cypionat 200mg  ing    Diagnosis: IMPOTENCE (ICD-302.72)    Route: IM    Site: LUOQ gluteus    Exp Date: 04/2011    Lot #: [id number]    Mfr: Abraxis    Patient tolerated injection without complications    Given by: Ami Bullins CMA (September 24, 2009 8:41 AM)  Orders Added: 1)  Testosterone Cypionat 200mg  ing [J1080]

## 2010-04-28 NOTE — Progress Notes (Signed)
Summary: nifedical & flonase  Phone Note Refill Request Message from:  Fax from Pharmacy on April 07, 2010 1:36 PM  Refills Requested: Medication #1:  NIFEDICAL XL 60 MG  TB24 Take 1 tablet by mouth once a day  Medication #2:  FLONASE 50 MCG/ACT SUSP Instill 2 sprays each nostril once daily [facility] [phone]  Initial call taken by: Orlan Leavens RMA,  April 07, 2010 1:36 PM    Prescriptions: FLONASE 50 MCG/ACT SUSP (FLUTICASONE PROPIONATE) Instill 2 sprays each nostril once daily  #3 x 0   Entered by:   Orlan Leavens RMA   Authorized by:   Jacques Navy MD   Signed by:   Orlan Leavens RMA on 04/07/2010   Method used:   Faxed to ...       [facility]       [address] [place], [place]  [address]       Ph: [phone]       Fax: [phone]   RxID:   [id number] NIFEDICAL XL 60 MG  TB24 (NIFEDIPINE) Take 1 tablet by mouth once a day  #90 x 0   Entered by:   Orlan Leavens RMA   Authorized by:   Jacques Navy MD   Signed by:   Orlan Leavens RMA on 04/07/2010   Method used:   Faxed to ...       [facility])       [address] [place], [place]  [address]       Ph: [phone]       Fax: [phone]   RxID:   [id number]

## 2010-04-28 NOTE — Assessment & Plan Note (Signed)
Summary: abdominal pain-lb   Vital Signs:  Patient profile:   70 year old male Height:      68 inches Weight:      197 pounds BMI:     30.06 O2 Sat:      95 % on Room air Temp:     96.7 degrees F oral Pulse rate:   58 / minute BP sitting:   110 / 64  (left arm) Cuff size:   regular  Vitals Entered By: Alysia Penna (December 30, 2009 4:56 PM)  O2 Flow:  Room air CC: pt c/o abdominal pain below belly button. has had some constipation & diarrhea. /cp sma   Primary Care Provider:  [facility]  CC:  pt c/o abdominal pain below belly button. has had some constipation & diarrhea. /cp sma.  History of Present Illness: [name] is a 70 y.o. white male who came to the clinic with a two week duration of stomach pain. During the first week of pain he experienced constipation. For the past two days prior to his clinic visit he has been having diarrhea. He has accompanying low-grade fevers, denies any flu-like symptoms, flank pain, nausea, night sweats, headaches. At the onset of symptoms, the patient had a slight burning sensation upon urination, but that has since resolved. He has taken advil for pain, but tries not to use it.  His pain is located in his left lower abdominal   region.  Current Medications (verified): 1)  Vitamin C 500 Mg  Tabs (Ascorbic Acid) .... Take 1 Tablet By Mouth Once A Day 2)  Calcium 600+d 600-400 Mg-Unit Tabs (Calcium Carbonate-Vitamin D) .... Take 1 Tablet By Mouth Two Times A Day 3)  Centrum Silver   Tabs (Multiple Vitamins-Minerals) .Marland Kitchen.. 1 Qd 4)  Labetalol Hcl 100 Mg  Tabs (Labetalol Hcl) .Marland Kitchen.. 1 Bid 5)  Adult Aspirin Ec Low Strength 81 Mg .Marland Kitchen.. 1 Qd 6)  Furosemide 20 Mg  Tabs (Furosemide) .Marland Kitchen.. 1 Qd 7)  Prilosec 20 Mg  Otc .Marland Kitchen.. 1 Qd 8)  Lovastatin 40 Mg  Tabs (Lovastatin) .Marland Kitchen.. 1 Qd 9)  Delatestryl 200 Mg/ml  Oil (Testosterone Enanthate) .... 400mg /month 10)  Enalapril Maleate 10 Mg  Tabs (Enalapril Maleate) .Marland Kitchen.. 1 Qd 11)  Cialis 10 Mg  Tabs (Tadalafil) ....  Take As Needed 12)  Dovonex 0.005 %  Crea (Calcipotriene) .... As Needed 13)  Nasaflo Neti Pot Nasal Wash   Pack (Hypertonic Nasal Wash) .... As Needed 14)  Nifedical Xl 60 Mg  Tb24 (Nifedipine) .... Take 1 Tablet By Mouth Once A Day 15)  Flonase 50 Mcg/act Susp (Fluticasone Propionate) .... Instill 2 Sprays Each Nostril Once Daily 16)  Testosterone Cypionate 200 Mg/ml Oil (Testosterone Cypionate) .... 2 Cc (400mg ) Q Mth  Allergies (verified): No Known Drug Allergies  Past History:  Past Medical History: Last updated: 10/06/2006 Anemia-NOS Diabetes mellitus, type II GERD Hyperlipidemia Hypertension Hypothyroidism Cleft palet Colon polyps CVA Esophageal Strictures r Thyroid cyst Psoriasis Chronic hearing loss Hypogonadism DJD of Cervical Spine  Past Surgical History: Last updated: 10/06/2006 EGD "98"  Family History: Last updated: May 05, 2008 father- deceased @ 25: DM, gout, CAD mother-deceased #@ 96: no chronic disease recorded Neg - colon, prostate cancer PSH reviewed for relevance  Review of Systems       The patient complains of fever and abdominal pain.  The patient denies weight loss, weight gain, vision loss, decreased hearing, chest pain, syncope, dyspnea on exertion, prolonged cough, headaches, melena, hematochezia, severe indigestion/heartburn,  hematuria, incontinence, and muscle weakness.    Physical Exam  General:  alert, appropriate dress, normal appearance, cooperative to examination, and good hygiene.   Eyes:  C&S clear Lungs:  Normal respiratory effort, chest expands symmetrically. Lungs are clear to auscultation, no crackles or wheezes. Heart:  Normal rate and regular rhythm. S1 and S2 normal without gallop, murmur, click, rub or other extra sounds. Abdomen:  Bowel sounds were hypoactive. Abdomen was soft to palpation. No aortic, renal, or inguinal bruits were auscultated. Tender to palpation/deep palpation of left lower quadrant region. No rebound  tenderness or guarding present. No costo-vertebral angle tenderness Pulses:  2+ radial Neurologic:  alert & oriented X3 and gait normal.   Skin:  turgor normal, color normal, and no suspicious lesions.   Inguinal Nodes:  no R inguinal adenopathy and no L inguinal adenopathy.     Impression & Recommendations:  Problem # 1:  DIVERTICULITIS, COLON (ICD-562.11) Mr. Tun's symptoms most likely represent a colonic diverticulitis.   Plan- Tylenol for pain/fever          Augmentin to treat infection  No CT was ordered because it would not change the course of treatment  Patient was educated about the possible complications of a diverticulitis such as a perforated colon.  Complete Medication List: 1)  Vitamin C 500 Mg Tabs (Ascorbic acid) .... Take 1 tablet by mouth once a day 2)  Calcium 600+d 600-400 Mg-unit Tabs (Calcium carbonate-vitamin d) .... Take 1 tablet by mouth two times a day 3)  Centrum Silver Tabs (Multiple vitamins-minerals) .Marland Kitchen.. 1 qd 4)  Labetalol Hcl 100 Mg Tabs (Labetalol hcl) .Marland Kitchen.. 1 bid 5)  Adult Aspirin Ec Low Strength 81 Mg  .Marland Kitchen.. 1 qd 6)  Furosemide 20 Mg Tabs (Furosemide) .Marland Kitchen.. 1 qd 7)  Prilosec 20 Mg Otc  .Marland Kitchen.. 1 qd 8)  Lovastatin 40 Mg Tabs (Lovastatin) .Marland Kitchen.. 1 qd 9)  Delatestryl 200 Mg/ml Oil (Testosterone enanthate) .... 400mg /month 10)  Enalapril Maleate 10 Mg Tabs (Enalapril maleate) .Marland Kitchen.. 1 qd 11)  Cialis 10 Mg Tabs (Tadalafil) .... Take as needed 12)  Dovonex 0.005 % Crea (Calcipotriene) .... As needed 13)  Nasaflo Neti Pot Nasal Wash Pack (Hypertonic nasal wash) .... As needed 14)  Nifedical Xl 60 Mg Tb24 (Nifedipine) .... Take 1 tablet by mouth once a day 15)  Flonase 50 Mcg/act Susp (Fluticasone propionate) .... Instill 2 sprays each nostril once daily 16)  Testosterone Cypionate 200 Mg/ml Oil (Testosterone cypionate) .... 2 cc (400mg ) q mth 17)  Amoxicillin-pot Clavulanate 875-125 Mg Tabs (Amoxicillin-pot clavulanate) .Marland Kitchen.. 1 by mouth two times a day for  diverticulitis Prescriptions: AMOXICILLIN-POT CLAVULANATE 875-125 MG TABS (AMOXICILLIN-POT CLAVULANATE) 1 by mouth two times a day for diverticulitis  #20 x 0   Entered and Authorized by:   Jacques Navy MD   Signed by:   Jacques Navy MD on 12/30/2009   Method used:   Electronically to        [facility]. [phone]* (retail)       [address]       [place], [place]  [address]       Ph: [phone]       Fax: [phone]   RxID:   [id number]

## 2010-05-31 ENCOUNTER — Other Ambulatory Visit: Payer: Self-pay | Admitting: Internal Medicine

## 2010-05-31 ENCOUNTER — Ambulatory Visit (INDEPENDENT_AMBULATORY_CARE_PROVIDER_SITE_OTHER): Payer: Medicare Other | Admitting: Internal Medicine

## 2010-05-31 ENCOUNTER — Other Ambulatory Visit: Payer: Medicare Other

## 2010-05-31 ENCOUNTER — Encounter: Payer: Self-pay | Admitting: Internal Medicine

## 2010-05-31 DIAGNOSIS — E119 Type 2 diabetes mellitus without complications: Secondary | ICD-10-CM

## 2010-05-31 DIAGNOSIS — Z0000 Encounter for general adult medical examination without abnormal findings: Secondary | ICD-10-CM

## 2010-05-31 DIAGNOSIS — I10 Essential (primary) hypertension: Secondary | ICD-10-CM

## 2010-05-31 DIAGNOSIS — L408 Other psoriasis: Secondary | ICD-10-CM | POA: Insufficient documentation

## 2010-05-31 DIAGNOSIS — L409 Psoriasis, unspecified: Secondary | ICD-10-CM | POA: Insufficient documentation

## 2010-05-31 DIAGNOSIS — E785 Hyperlipidemia, unspecified: Secondary | ICD-10-CM

## 2010-05-31 DIAGNOSIS — E039 Hypothyroidism, unspecified: Secondary | ICD-10-CM

## 2010-05-31 DIAGNOSIS — D649 Anemia, unspecified: Secondary | ICD-10-CM

## 2010-05-31 LAB — CBC WITH DIFFERENTIAL/PLATELET
Basophils Absolute: 0 10*3/uL (ref 0.0–0.1)
Eosinophils Absolute: 0.1 10*3/uL (ref 0.0–0.7)
MCHC: 34.9 g/dL (ref 30.0–36.0)
MCV: 93.8 fl (ref 78.0–100.0)
Monocytes Absolute: 0.3 10*3/uL (ref 0.1–1.0)
Neutrophils Relative %: 61.1 % (ref 43.0–77.0)
Platelets: 177 10*3/uL (ref 150.0–400.0)
RDW: 13.4 % (ref 11.5–14.6)
WBC: 3.9 10*3/uL — ABNORMAL LOW (ref 4.5–10.5)

## 2010-05-31 LAB — HEPATIC FUNCTION PANEL
ALT: 46 U/L (ref 0–53)
Albumin: 4.4 g/dL (ref 3.5–5.2)
Alkaline Phosphatase: 80 U/L (ref 39–117)
Total Protein: 7.4 g/dL (ref 6.0–8.3)

## 2010-05-31 LAB — LIPID PANEL
Cholesterol: 151 mg/dL (ref 0–200)
LDL Cholesterol: 90 mg/dL (ref 0–99)
Triglycerides: 107 mg/dL (ref 0.0–149.0)

## 2010-05-31 LAB — BASIC METABOLIC PANEL
Calcium: 9.5 mg/dL (ref 8.4–10.5)
GFR: 77.34 mL/min (ref >60.00)
Potassium: 4.6 mEq/L (ref 3.5–5.1)
Sodium: 140 mEq/L (ref 135–145)

## 2010-06-07 NOTE — Assessment & Plan Note (Signed)
Summary: YEARLY -----STC   Vital Signs:  Patient profile:   70 year old male Height:      68 inches Weight:      203 pounds BMI:     30.98 O2 Sat:      97 % on Room air Temp:     97.5 degrees F oral Pulse rate:   57 / minute BP sitting:   118 / 78  (left arm) Cuff size:   regular  Vitals Entered By: Bill Salinas CMA (May 31, 2010 10:13 AM)  O2 Flow:  Room air CC: yearly/ ab  Vision Screening:      Vision Comments: Feb 2011, Normal eye exam pt is due this year   Primary Care Provider:  Leniyah Martell  CC:  yearly/ ab.  History of Present Illness: [name] presents for annual wellness exam. He is currently doing well. He has had a bout with diverticulitis and he did have an intercostal strain at the right scapula.  He is 100% independent in all of ADLs. He is cognitively intact: does Nurse, adult; works for [employer]; stays generally busy. He has had no falls and has no fall risk. He has no signs or symptoms of depression.   Preventive Screening-Counseling & Management  Alcohol-Tobacco     Alcohol drinks/day: <1     Alcohol type: wine     Smoking Status: quit     Year Quit: 1970  Caffeine-Diet-Exercise     Caffeine use/day: 2 cups per day     Diet Comments: regular and healthy diet     Does Patient Exercise: yes     Type of exercise: walking     Exercise (avg: min/session): 30-60     Times/week: 3  Hep-HIV-STD-Contraception     Hepatitis Risk: no risk noted     HIV Risk: no risk noted     STD Risk: no risk noted     Dental Visit-last 6 months yes     TSE monthly: no     Sun Exposure-Excessive: no  Safety-Violence-Falls     Seat Belt Use: yes     Helmet Use: n/a     Firearms in the Home: firearms in the home     Smoke Detectors: yes     Violence in the Home: no risk noted     Sexual Abuse: no     Fall Risk: low fall risk      Sexual History:  currently monogamous.        Drug Use:  never.        Blood Transfusions:  no.    Current Medications  (verified): 1)  Vitamin C 500 Mg  Tabs (Ascorbic Acid) .... Take 1 Tablet By Mouth Once A Day 2)  Calcium 600+d 600-400 Mg-Unit Tabs (Calcium Carbonate-Vitamin D) .... Take 1 Tablet By Mouth Two Times A Day 3)  Centrum Silver   Tabs (Multiple Vitamins-Minerals) .Marland Kitchen.. 1 Qd 4)  Labetalol Hcl 100 Mg  Tabs (Labetalol Hcl) .Marland Kitchen.. 1 Bid 5)  Adult Aspirin Ec Low Strength 81 Mg .Marland Kitchen.. 1 Qd 6)  Furosemide 20 Mg  Tabs (Furosemide) .Marland Kitchen.. 1 Qd 7)  Prilosec 20 Mg  Otc .Marland Kitchen.. 1 Qd 8)  Lovastatin 40 Mg  Tabs (Lovastatin) .Marland Kitchen.. 1 Qd 9)  Enalapril Maleate 10 Mg  Tabs (Enalapril Maleate) .Marland Kitchen.. 1 Qd 10)  Cialis 10 Mg  Tabs (Tadalafil) .... Take As Needed 11)  Dovonex 0.005 %  Crea (Calcipotriene) .... As Needed 12)  Nasaflo  Neti Pot Nasal Wash   Pack (Hypertonic Nasal Wash) .... As Needed 13)  Nifedical Xl 60 Mg  Tb24 (Nifedipine) .... Take 1 Tablet By Mouth Once A Day 14)  Flonase 50 Mcg/act Susp (Fluticasone Propionate) .... Instill 2 Sprays Each Nostril Once Daily 15)  Betamethasone Valerate 0.1 % Oint (Betamethasone Valerate) .... Apply Thick 2/day Coat To Affected Areas 16)  Vectical 3 Mcg/gm Oint (Calcitriol) .... Apply Thick 2/day To Affected Area 17)  Desonide 0.05 % Crea (Desonide) .... Apply Thick 2/day Coat To Affected Area  Allergies (verified): No Known Drug Allergies  Past History:  Past Medical History: Last updated: 10/06/2006 Anemia-NOS Diabetes mellitus, type II GERD Hyperlipidemia Hypertension Hypothyroidism Cleft palet Colon polyps CVA Esophageal Strictures r Thyroid cyst Psoriasis Chronic hearing loss Hypogonadism DJD of Cervical Spine  Past Surgical History: Last updated: 10/06/2006 EGD "98"  Family History: Last updated: Apr 22, 2008 father- deceased @ 63: DM, gout, CAD mother-deceased #@ 69: no chronic disease recorded Neg - colon, prostate cancer  Social History: Last updated: Apr 22, 2008 [employer], [place] - BS Patent attorney, MS computer science married '63 3  sons - ''28, '67, '80 middle  son is a cardiologist in [place]; youngest a lawye; 9 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, adult company marriage is in good health I-ADLs  Social History: Caffeine use/day:  2 cups per day Does Patient Exercise:  yes Dental Care w/in 6 mos.:  yes Sun Exposure-Excessive:  no Risk analyst Use:  yes Fall Risk:  low fall risk Hepatitis Risk:  no risk noted HIV Risk:  no risk noted STD Risk:  no risk noted Sexual History:  currently monogamous Drug Use:  never Blood Transfusions:  no  Review of Systems  The patient denies anorexia, fever, weight loss, weight gain, decreased hearing, hoarseness, chest pain, syncope, dyspnea on exertion, prolonged cough, headaches, abdominal pain, severe indigestion/heartburn, incontinence, genital sores, muscle weakness, difficulty walking, depression, abnormal bleeding, and angioedema.    Physical Exam  General:  Well-developed,well-nourished,in no acute distress; alert,appropriate and cooperative throughout examination Head:  Normocephalic and atraumatic without obvious abnormalities. No apparent alopecia or balding. Eyes:  No corneal or conjunctival inflammation noted. EOMI. Perrla. Funduscopic exam benign, without hemorrhages, exudates or papilledema. Vision grossly normal. Ears:  hearing aids bilaterally. EACs normal, TMs normal, no cerumen build up Nose:  no external deformity, no external erythema, and no nasal discharge.   Mouth:  chipped left canine otherwise normal, no oral lesions, posterior pharynx clear Neck:  supple, full ROM, no thyromegaly, and no carotid bruits.   Chest Wall:  no deformities.   Lungs:  Normal respiratory effort, chest expands symmetrically. Lungs are clear to auscultation, no crackles or wheezes. Heart:  Normal rate and regular rhythm. S1 and S2 normal without gallop, murmur, click, rub or other extra sounds. Abdomen:  soft, normal bowel sounds, no distention, no guarding, and no hepatomegaly.     Rectal:  No external abnormalities noted. Normal sphincter tone. No rectal masses or tenderness. Prostate:  Prostate gland firm and smooth, no enlargement, nodularity, tenderness, mass, asymmetry or induration. Msk:  normal ROM, no joint tenderness, no joint swelling, no joint warmth, no redness over joints, and no joint instability.   Pulses:  2+ radial pulses Extremities:  No clubbing, cyanosis, edema, or deformity noted with normal full range of motion of all joints.   Neurologic:  alert & oriented X3, cranial nerves II-XII intact, strength normal in all extremities, sensation intact to light touch, gait normal, and DTRs symmetrical and normal.  Skin:  turgor normal, color normal, no rashes, no suspicious lesions, and no petechiae.   Cervical Nodes:  no anterior cervical adenopathy and no posterior cervical adenopathy.   Inguinal Nodes:  no R inguinal adenopathy and no L inguinal adenopathy.   Psych:  Oriented X3, memory intact for recent and remote, normally interactive, good eye contact, and not anxious appearing.     Impression & Recommendations:  Problem # 1:  IMPOTENCE (ICD-302.72) Off testosterone replacement and he reports that he has not noticed any changes in strength or cognitive function.  His updated medication list for this problem includes:    Cialis 10 Mg Tabs (Tadalafil) .Marland Kitchen... Take as needed  Problem # 2:  HYPOTHYROIDISM (ICD-244.9) Due for lab with recommendations to follow.  Orders: TLB-TSH (Thyroid Stimulating Hormone) (84443-TSH)  Addendum - TSH normal range.   Problem # 3:  HYPERTENSION (ICD-401.9) His updated medication list for this problem includes:    Labetalol Hcl 100 Mg Tabs (Labetalol hcl) .Marland Kitchen... 1 bid    Furosemide 20 Mg Tabs (Furosemide) .Marland Kitchen... 1 qd    Enalapril Maleate 10 Mg Tabs (Enalapril maleate) .Marland Kitchen... 1 qd    Nifedical Xl 60 Mg Tb24 (Nifedipine) .Marland Kitchen... Take 1 tablet by mouth once a day  Orders: TLB-BMP (Basic Metabolic Panel-BMET)  (80048-METABOL)  BP today: 118/78 Prior BP: 120/78 (04/12/2010)  \Excellent control. Plan is to continue present medications - Rx refills provided  Problem # 4:  HYPERLIPIDEMIA (ICD-272.4) No complaints re: side effects. Due for lipid panel - recommnedations to follow.  His updated medication list for this problem includes:    Lovastatin 40 Mg Tabs (Lovastatin) .Marland Kitchen... 1 qd  Orders: TLB-Lipid Panel (80061-LIPID) TLB-Hepatic/Liver Function Pnl (80076-HEPATIC)  addendum- lipid panel is great with LDL 90 - at goal  Plan - continue present meds  Problem # 5:  DIABETES MELLITUS, TYPE II (ICD-250.00) Diet managed. No symptoms.  Plan - A1c with recommendations to follow.  His updated medication list for this problem includes:    Enalapril Maleate 10 Mg Tabs (Enalapril maleate) .Marland Kitchen... 1 qd  Orders: TLB-A1C / Hgb A1C (Glycohemoglobin) (83036-A1C)  Addendum - A1C 6.3% - normal range.  Problem # 6:  PSORIASIS (ICD-696.1) Patient is followed by Dr. Jodene Nam (sic) at [facility] and has been started on a higher potency regimen of topical agents. He is doing well and tolerating this regimen.  Problem # 7:  Preventive Health Care (ICD-V70.0)  Interval history with diverticulitis which is resolved. He has  continued to be seen in [facility] for dermatology and he is doing well. His back pain has resolved. Physical exam is normal. Lab results are within normal limits.  He is current with colorectal cancer screening with last study in March '04. Normal prostate exam. Immunization: Tetnus July '97; pneumonia vaccine Jan '12; shingles -    12 Lead EKG without evidence of injury or ischemia.   In summary - a nice man who appears fit and medically stabl.e He will return as needed or on 1 year.  Orders: Medicare -1st Annual Wellness Visit [phone])  Complete Medication List: 1)  Vitamin C 500 Mg Tabs (Ascorbic acid) .... Take 1 tablet by mouth once a day 2)  Calcium 600+d 600-400 Mg-unit Tabs  (Calcium carbonate-vitamin d) .... Take 1 tablet by mouth two times a day 3)  Centrum Silver Tabs (Multiple vitamins-minerals) .Marland Kitchen.. 1 qd 4)  Labetalol Hcl 100 Mg Tabs (Labetalol hcl) .Marland Kitchen.. 1 bid 5)  Adult Aspirin Ec Low Strength 81 Mg  .Marland KitchenMarland KitchenMarland Kitchen  1 qd 6)  Furosemide 20 Mg Tabs (Furosemide) .Marland Kitchen.. 1 qd 7)  Prilosec 20 Mg Otc  .Marland Kitchen.. 1 qd 8)  Lovastatin 40 Mg Tabs (Lovastatin) .Marland Kitchen.. 1 qd 9)  Enalapril Maleate 10 Mg Tabs (Enalapril maleate) .Marland Kitchen.. 1 qd 10)  Cialis 10 Mg Tabs (Tadalafil) .... Take as needed 11)  Dovonex 0.005 % Crea (Calcipotriene) .... As needed 12)  Nasaflo Neti Pot Nasal Wash Pack (Hypertonic nasal wash) .... As needed 13)  Nifedical Xl 60 Mg Tb24 (Nifedipine) .... Take 1 tablet by mouth once a day 14)  Flonase 50 Mcg/act Susp (Fluticasone propionate) .... Instill 2 sprays each nostril once daily 15)  Betamethasone Valerate 0.1 % Oint (Betamethasone valerate) .... Apply thick 2/day coat to affected areas 16)  Vectical 3 Mcg/gm Oint (Calcitriol) .... Apply thick 2/day to affected area 17)  Desonide 0.05 % Crea (Desonide) .... Apply thick 2/day coat to affected area  Other Orders: TLB-CBC Platelet - w/Differential (85025-CBCD)   Patient: Trevor Cooke Note: All result statuses are Final unless otherwise noted.  Tests: (1) Lipid Panel (LIPID)   Cholesterol               151 mg/dL                   1-610     ATP III Classification            Desirable:  < 200 mg/dL                    Borderline High:  200 - 239 mg/dL               High:  > = 240 mg/dL   Triglycerides             107.0 mg/dL                 9.6-045.4     Normal:  <150 mg/dL     Borderline High:  098 - 199 mg/dL   HDL                       11.91 mg/dL                 >47.82   VLDL Cholesterol          21.4 mg/dL                  9.5-62.1   LDL Cholesterol           90 mg/dL                    3-08  CHO/HDL Ratio:  CHD Risk                             4                    Men          Women     1/2 Average Risk      3.4          3.3     Average Risk          5.0          4.4     2X Average Risk          9.6          7.1  3X Average Risk          15.0          11.0                           Tests: (2) Hepatic/Liver Function Panel (HEPATIC)   Total Bilirubin           0.5 mg/dL                   4.4-0.3   Direct Bilirubin          0.2 mg/dL                   4.7-4.2   Alkaline Phosphatase      80 U/L                      39-117   AST                  [H]  38 U/L                      0-37   ALT                       46 U/L                      0-53   Total Protein             7.4 g/dL                    5.9-5.6   Albumin                   4.4 g/dL                    3.8-7.5  Tests: (3) TSH (TSH)   FastTSH                   4.28 uIU/mL                 0.35-5.50  Tests: (4) Hemoglobin A1C (A1C)   Hemoglobin A1C            6.3 %                       4.6-6.5     Glycemic Control Guidelines for People with Diabetes:     Non Diabetic:  <6%     Goal of Therapy: <7%     Additional Action Suggested:  >8%   Tests: (5) CBC Platelet w/Diff (CBCD)   White Cell Count     [L]  3.9 K/uL                    4.5-10.5   Red Cell Count       [L]  4.11 Mil/uL                 4.22-5.81   Hemoglobin                13.4 g/dL                   64.3-32.9   Hematocrit           [L]  38.5 %  39.0-52.0   MCV                       93.8 fl                     78.0-100.0   MCHC                      34.9 g/dL                   56.2-13.0   RDW                       13.4 %                      11.5-14.6   Platelet Count            177.0 K/uL                  150.0-400.0   Neutrophil %              61.1 %                      43.0-77.0   Lymphocyte %              28.9 %                      12.0-46.0   Monocyte %                7.1 %                       3.0-12.0   Eosinophils%              2.8 %                       0.0-5.0   Basophils %               0.1 %                       0.0-3.0   Neutrophill  Absolute      2.4 K/uL                    1.4-7.7   Lymphocyte Absolute       1.1 K/uL                    0.7-4.0   Monocyte Absolute         0.3 K/uL                    0.1-1.0  Eosinophils, Absolute                             0.1 K/uL                    0.0-0.7   Basophils Absolute        0.0 K/uL                    0.0-0.1  Tests: (6) BMP (METABOL)   Sodium                    140  mEq/L                   135-145   Potassium                 4.6 mEq/L                   3.5-5.1   Chloride                  101 mEq/L                   96-112   Carbon Dioxide       [H]  34 mEq/L                    19-32   Glucose              [H]  120 mg/dL                   91-47   BUN                       17 mg/dL                    8-29   Creatinine                1.0 mg/dL                   5.6-2.1   Calcium                   9.5 mg/dL                   3.0-86.5   GFR                       77.34 mL/min                >60.00Prescriptions: FLONASE 50 MCG/ACT SUSP (FLUTICASONE PROPIONATE) Instill 2 sprays each nostril once daily  #3 x 0   Entered and Authorized by:   Jacques Navy MD   Signed by:   Jacques Navy MD on 05/31/2010   Method used:   Print then Give to Patient   RxID:   [id number] NIFEDICAL XL 60 MG  TB24 (NIFEDIPINE) Take 1 tablet by mouth once a day  #90 x 0   Entered and Authorized by:   Jacques Navy MD   Signed by:   Jacques Navy MD on 05/31/2010   Method used:   Print then Give to Patient   RxID:   [id number] CIALIS 10 MG  TABS (TADALAFIL) take as needed  #12 x 3   Entered and Authorized by:   Jacques Navy MD   Signed by:   Jacques Navy MD on 05/31/2010   Method used:   Print then Give to Patient   RxID:   [id number] ENALAPRIL MALEATE 10 MG  TABS (ENALAPRIL MALEATE) 1 qd  #90 x 3   Entered and Authorized by:   Jacques Navy MD   Signed by:   Jacques Navy MD on 05/31/2010   Method used:   Print then Give to Patient   RxID:    [id number] LOVASTATIN 40 MG  TABS (LOVASTATIN) 1 qd  #90 x 3   Entered and Authorized by:   Jacques Navy MD   Signed  by:   Jacques Navy MD on 05/31/2010   Method used:   Print then Give to Patient   RxID:   [id number] FUROSEMIDE 20 MG  TABS (FUROSEMIDE) 1 qd  #90 x 3   Entered and Authorized by:   Jacques Navy MD   Signed by:   Jacques Navy MD on 05/31/2010   Method used:   Print then Give to Patient   RxID:   [id number] LABETALOL HCL 100 MG  TABS (LABETALOL HCL) 1 BID  #180 x 3   Entered and Authorized by:   Jacques Navy MD   Signed by:   Jacques Navy MD on 05/31/2010   Method used:   Print then Give to Patient   RxID:   [id number]    Orders Added: 1)  Medicare -1st Annual Wellness Visit [G0438] 2)  Est. Patient Level III [01027] 3)  TLB-Lipid Panel [80061-LIPID] 4)  TLB-Hepatic/Liver Function Pnl [80076-HEPATIC] 5)  TLB-TSH (Thyroid Stimulating Hormone) [84443-TSH] 6)  TLB-A1C / Hgb A1C (Glycohemoglobin) [83036-A1C] 7)  TLB-CBC Platelet - w/Differential [85025-CBCD] 8)  TLB-BMP (Basic Metabolic Panel-BMET) [80048-METABOL]   Immunization History:  Influenza Immunization History:    Influenza:  historical (12/29/2009)   Immunization History:  Influenza Immunization History:    Influenza:  Historical (12/29/2009)

## 2010-08-12 NOTE — Assessment & Plan Note (Signed)
[facility]                           PRIMARY CARE OFFICE NOTE   REMER, COUSE                   MRN:          [id number]  DATE:04/10/2006                            DOB:          Jul 13, 1940    [name] is a very pleasant 70 year old gentleman who presents for  followup evaluation of his medical problems.  The patient was last seen  in the office April 25, 2005 and has been doing well in the interval.   PAST MEDICAL HISTORY:   SURGICAL:  Repair of cleft lip.   MEDICAL:  1. Usual childhood disease.  2. Hypertension.  3. Colon polyp.  4. Psoriasis.  5. Gout.  6. RIND in 1996 with no sequela.  7. Thyroid nodule, benign.  8. Hyperlipidemia.  9. GERD with esophageal stricture with last dilation in 2000.  10.Hyperglycemia, diet-managed.  11.Bilateral sensory neural hearing loss.   CURRENT MEDICATIONS:  1. Vitamin C 500 mg daily.  2. Calcium and magnesium daily.  3. Labetalol 100 mg b.i.d.  4. Aspirin 81 mg daily.  5. Lasix 20 mg daily.  6. Prilosec 20 mg q. a.m. p.r.n.  7. Lovastatin 40 mg daily.  8. Testosterone injection 200 mg per mL 2 mL per month.  9. Enalapril 10 mg daily.  10.Nifedipine XL 60 mg daily.  11.Flonase nasal spray p.r.n.  12.Fluocinonide 0.05% p.r.n.  13.Desonide 0.05% cream p.r.n.   FAMILY HISTORY:  Noncontributory.   SOCIAL HISTORY:  The patient is retired.  He reports his son has  finished a cardiology fellowship and, I believe, is working in [place].  The patient's home life is stable.   REVIEW OF SYSTEMS:  The patient has had no constitutional,  cardiovascular, respiratory, GI, or GU problems.   EXAMINATION:  Temperature is 96.7, blood pressure 135/83, pulse 67,  weight 200 pounds.  GENERAL:  This is a mildly overweight Caucasian male who looks his  stated age and in no acute distress.  HEENT:  Normocephalic, atraumatic.  EACs and TMs were unremarkable.  Oropharynx with native dentition in  good repair.  No buccal or palatal  lesions were noted.  Posterior pharynx was clear.  Conjunctivae and  sclerae were clear.  PERRLA.  EOMI.  Funduscopic exam with hand-held  instrument revealed normal disk margins with no vascular abnormalities.  NECK:  Supple without thyromegaly.  NODES:  No adenopathy was noted in the cervical or supraclavicular  region.  CHEST:  No CVA tenderness.  LUNGS:  Clear to auscultation and percussion.  CARDIOVASCULAR:  2+ radial pus les.  No JVD or carotid bruits.  He had a  quiet precordium with regular rate and rhythm without murmur, rub, or  gallop.  ABDOMEN:  Soft.  No guarding or rebound.  No organo-splenomegaly  appreciated.  GENITALIA:  Normal male phallus.  Bilaterally descended testicles  without masses.  RECTAL:  Normal sphincter tone was noted.  Prostate was smooth, round,  normal size and contour without nodules.  EXTREMITIES:  Without cyanosis, clubbing, or edema, or deformity.  NEUROLOGIC:  Nonfocal.  DERM:  The patient had some intertriginous erythema at the buttocks and  sacrum area.   CHART REVIEW:  The patient's last colonoscopy June 25, 2002, which was  notable for diverticulosis, otherwise normal, with repeat scheduled in  2014.  The patient did have a bone density study in 2006, which showed  the patient had minimal osteopenia at the femoral neck, otherwise a  normal study.  Last EKG from October 19, 1999 was unremarkable.   DATABASE:  Hemoglobin was 50.7 g, white count 4600, platelet count was  normal.  Chemistries were unremarkable with a serum glucose of 113.  Electrolytes were normal.  Kidney function normal with a creatinine of  1.3 and a GFR of 59.  Liver functions were normal.  Thyroid function  normal with a TSH of 1.87, PSA was normal at 0.51.  Cholesterol was 129,  triglycerides 74, HDL 35.4, LDL was 79.   ASSESSMENT AND PLAN:  1. Hypertension.  The patient's blood pressure is adequately      controlled on his present  medications.  He will continue the same.  2. Hyperlipidemia.  The patient's cholesterol is excellent and he will      continue on lovastatin.  3. Impotence.  The patient is doing well with testosterone injections.      We did look at and price out alternative methods, including creams      and patch, but these are both daily and too inconvenient, and he      will continue to take testosterone injections.  4. Hyperglycemia.  In reviewing the patient's chart, he has had      minimally elevated serum glucose in the past.  His hemoglobin A1Cs      have always been normal, last being October 19, 2003 at 5.9%.  This      patient has minimal hyperglycemia.  Technically, there was a      fasting serum glucose of greater than 115 in the past, one could      consider him diabetic, although at this point, I would consider      more hyperglycemic and he will just continue with diet management.      I do not believe this poses any increased risk in regards to      morbidity or mortality.  5. Derm.  The patient's psoriasis is intermittent and stable.  6. Health maintenance.  The patient is current with colorectal cancer      screening.  Examination was normal and PSA is normal.   SUMMARY:  This is a very pleasant gentleman whom I think is medically  stable.  I have asked him to return and see me in 1 year or on a p.r.n.  basis.     Rosalyn Gess Norins, MD  Electronically Signed    MEN/MedQ  DD: 04/11/2006  DT: 04/11/2006  Job #: [id number]   cc:   Christy Sartorius

## 2010-10-18 ENCOUNTER — Telehealth: Payer: Self-pay | Admitting: *Deleted

## 2010-10-18 NOTE — Telephone Encounter (Signed)
Patient requesting RFs of nifedipine XR 60mg  and labetalol 100mg  to go to [facility]. OK for 90 day supply with 3 rfs?   Pt would like a call when complete.

## 2010-10-18 NOTE — Telephone Encounter (Signed)
Ok for refills as requested.

## 2010-10-19 MED ORDER — NIFEDIPINE ER OSMOTIC RELEASE 60 MG PO TB24: 60.0000 mg | ORAL_TABLET | Freq: Every day | ORAL | Status: DC

## 2010-10-19 MED ORDER — LABETALOL HCL 100 MG PO TABS: 100.0000 mg | ORAL_TABLET | Freq: Two times a day (BID) | ORAL | Status: DC

## 2010-10-19 NOTE — Telephone Encounter (Signed)
Patient informed. 

## 2011-04-13 DIAGNOSIS — L408 Other psoriasis: Secondary | ICD-10-CM | POA: Diagnosis not present

## 2011-05-17 DIAGNOSIS — M9981 Other biomechanical lesions of cervical region: Secondary | ICD-10-CM | POA: Diagnosis not present

## 2011-05-17 DIAGNOSIS — M542 Cervicalgia: Secondary | ICD-10-CM | POA: Diagnosis not present

## 2011-05-17 DIAGNOSIS — M5030 Other cervical disc degeneration, unspecified cervical region: Secondary | ICD-10-CM | POA: Diagnosis not present

## 2011-05-18 DIAGNOSIS — M542 Cervicalgia: Secondary | ICD-10-CM | POA: Diagnosis not present

## 2011-05-18 DIAGNOSIS — M5030 Other cervical disc degeneration, unspecified cervical region: Secondary | ICD-10-CM | POA: Diagnosis not present

## 2011-05-18 DIAGNOSIS — M9981 Other biomechanical lesions of cervical region: Secondary | ICD-10-CM | POA: Diagnosis not present

## 2011-05-22 DIAGNOSIS — M542 Cervicalgia: Secondary | ICD-10-CM | POA: Diagnosis not present

## 2011-05-22 DIAGNOSIS — M9981 Other biomechanical lesions of cervical region: Secondary | ICD-10-CM | POA: Diagnosis not present

## 2011-05-22 DIAGNOSIS — M5030 Other cervical disc degeneration, unspecified cervical region: Secondary | ICD-10-CM | POA: Diagnosis not present

## 2011-05-31 ENCOUNTER — Other Ambulatory Visit: Payer: Self-pay | Admitting: *Deleted

## 2011-05-31 MED ORDER — LOVASTATIN 40 MG PO TABS: 40.0000 mg | ORAL_TABLET | Freq: Every day | ORAL | Status: DC

## 2011-05-31 MED ORDER — ENALAPRIL MALEATE 10 MG PO TABS: 10.0000 mg | ORAL_TABLET | Freq: Every day | ORAL | Status: DC

## 2011-05-31 MED ORDER — FUROSEMIDE 20 MG PO TABS: 20.0000 mg | ORAL_TABLET | Freq: Every day | ORAL | Status: DC

## 2011-06-20 ENCOUNTER — Other Ambulatory Visit: Payer: Self-pay | Admitting: *Deleted

## 2011-06-20 MED ORDER — FLUTICASONE PROPIONATE 50 MCG/ACT NA SUSP: 2.0000 | Freq: Every day | NASAL | Status: DC

## 2011-06-20 NOTE — Telephone Encounter (Signed)
Done

## 2011-07-19 DIAGNOSIS — H00029 Hordeolum internum unspecified eye, unspecified eyelid: Secondary | ICD-10-CM | POA: Diagnosis not present

## 2011-07-19 DIAGNOSIS — H2510 Age-related nuclear cataract, unspecified eye: Secondary | ICD-10-CM | POA: Diagnosis not present

## 2011-07-21 ENCOUNTER — Other Ambulatory Visit: Payer: Self-pay | Admitting: *Deleted

## 2011-07-21 MED ORDER — FUROSEMIDE 20 MG PO TABS: 20.0000 mg | ORAL_TABLET | Freq: Every day | ORAL | Status: DC

## 2011-07-31 ENCOUNTER — Ambulatory Visit (INDEPENDENT_AMBULATORY_CARE_PROVIDER_SITE_OTHER)
Admission: RE | Admit: 2011-07-31 | Discharge: 2011-07-31 | Disposition: A | Discharge status: Home / Self Care | Payer: Medicare Other | Source: Ambulatory Visit | Attending: Internal Medicine | Admitting: Internal Medicine

## 2011-07-31 ENCOUNTER — Ambulatory Visit (INDEPENDENT_AMBULATORY_CARE_PROVIDER_SITE_OTHER): Payer: Medicare Other | Admitting: Internal Medicine

## 2011-07-31 ENCOUNTER — Other Ambulatory Visit (INDEPENDENT_AMBULATORY_CARE_PROVIDER_SITE_OTHER): Payer: Medicare Other

## 2011-07-31 ENCOUNTER — Encounter: Payer: Self-pay | Admitting: Internal Medicine

## 2011-07-31 VITALS — BP 122/78 | HR 63 | Temp 97.3°F | Resp 16 | Wt 198.0 lb

## 2011-07-31 DIAGNOSIS — M19049 Primary osteoarthritis, unspecified hand: Secondary | ICD-10-CM | POA: Diagnosis not present

## 2011-07-31 DIAGNOSIS — Z0000 Encounter for general adult medical examination without abnormal findings: Secondary | ICD-10-CM | POA: Diagnosis not present

## 2011-07-31 DIAGNOSIS — L408 Other psoriasis: Secondary | ICD-10-CM | POA: Diagnosis not present

## 2011-07-31 DIAGNOSIS — I10 Essential (primary) hypertension: Secondary | ICD-10-CM

## 2011-07-31 DIAGNOSIS — M25519 Pain in unspecified shoulder: Secondary | ICD-10-CM | POA: Diagnosis not present

## 2011-07-31 DIAGNOSIS — E119 Type 2 diabetes mellitus without complications: Secondary | ICD-10-CM | POA: Diagnosis not present

## 2011-07-31 DIAGNOSIS — M79643 Pain in unspecified hand: Secondary | ICD-10-CM

## 2011-07-31 DIAGNOSIS — E785 Hyperlipidemia, unspecified: Secondary | ICD-10-CM | POA: Diagnosis not present

## 2011-07-31 DIAGNOSIS — E039 Hypothyroidism, unspecified: Secondary | ICD-10-CM

## 2011-07-31 DIAGNOSIS — M129 Arthropathy, unspecified: Secondary | ICD-10-CM | POA: Diagnosis not present

## 2011-07-31 DIAGNOSIS — M25819 Other specified joint disorders, unspecified shoulder: Secondary | ICD-10-CM | POA: Diagnosis not present

## 2011-07-31 DIAGNOSIS — F528 Other sexual dysfunction not due to a substance or known physiological condition: Secondary | ICD-10-CM | POA: Diagnosis not present

## 2011-07-31 DIAGNOSIS — M79609 Pain in unspecified limb: Secondary | ICD-10-CM

## 2011-07-31 DIAGNOSIS — M1990 Unspecified osteoarthritis, unspecified site: Secondary | ICD-10-CM

## 2011-07-31 DIAGNOSIS — K219 Gastro-esophageal reflux disease without esophagitis: Secondary | ICD-10-CM

## 2011-07-31 LAB — HEPATIC FUNCTION PANEL
ALT: 18 U/L (ref 0–53)
AST: 22 U/L (ref 0–37)
Albumin: 4.1 g/dL (ref 3.5–5.2)
Alkaline Phosphatase: 96 U/L (ref 39–117)
Bilirubin, Direct: 0.1 mg/dL (ref 0.0–0.3)
Total Protein: 7.8 g/dL (ref 6.0–8.3)

## 2011-07-31 LAB — LIPID PANEL
HDL: 38 mg/dL — ABNORMAL LOW (ref >39.00)
LDL Cholesterol: 60 mg/dL (ref 0–99)
Total CHOL/HDL Ratio: 3
Triglycerides: 105 mg/dL (ref 0.0–149.0)

## 2011-07-31 LAB — COMPREHENSIVE METABOLIC PANEL
ALT: 18 U/L (ref 0–53)
AST: 22 U/L (ref 0–37)
Alkaline Phosphatase: 96 U/L (ref 39–117)
Creatinine, Ser: 1 mg/dL (ref 0.4–1.5)
Sodium: 141 mEq/L (ref 135–145)
Total Bilirubin: 0.6 mg/dL (ref 0.3–1.2)
Total Protein: 7.8 g/dL (ref 6.0–8.3)

## 2011-07-31 MED ORDER — FLUTICASONE PROPIONATE 50 MCG/ACT NA SUSP: 2.0000 | Freq: Every day | NASAL | Status: DC

## 2011-07-31 MED ORDER — FUROSEMIDE 20 MG PO TABS: 20.0000 mg | ORAL_TABLET | Freq: Every day | ORAL | Status: DC

## 2011-07-31 MED ORDER — LOVASTATIN 40 MG PO TABS: 40.0000 mg | ORAL_TABLET | Freq: Every day | ORAL | Status: DC

## 2011-07-31 MED ORDER — ENALAPRIL MALEATE 10 MG PO TABS: 10.0000 mg | ORAL_TABLET | Freq: Every day | ORAL | Status: DC

## 2011-07-31 MED ORDER — LABETALOL HCL 100 MG PO TABS: 100.0000 mg | ORAL_TABLET | Freq: Two times a day (BID) | ORAL | Status: DC

## 2011-07-31 MED ORDER — NIFEDIPINE ER OSMOTIC RELEASE 60 MG PO TB24: 60.0000 mg | ORAL_TABLET | Freq: Every day | ORAL | Status: DC

## 2011-07-31 NOTE — Progress Notes (Signed)
Subjective:    Patient ID: Trevor Cooke, male    DOB: 1940/07/26, 71 y.o.   MRN: [id number]  HPI Trevor Cooke  is here for annual Medicare wellness examination and management of other chronic and acute problems.  He has seen Trevor Cooke - dermatology at [facility];  Trevor Cooke - [facility].  He is concerned about whether he is diabetic. Reviewed chart - since '09 A1c has been, annually, 6.4, 6.3, 6.3, 6.3.  He continues to have ED and there is a question of testosterone deficiency.  He is having bilateral shoulder pain left > right for several months. It hurts at night when he lays on the shoulder(s) and does awaken him. Trevor Cooke who finds no problem. He is also having a lot of and pain that is resolved with heat. He is unable to make a fist in the AM.   The risk factors are reflected in the social history.  The roster of all physicians providing medical care to patient - is listed in the Snapshot section of the chart.  Activities of daily living:  The patient is 100% inedpendent in all ADLs: dressing, toileting, feeding as well as independent mobility  Home safety : The patient has smoke detectors in the home. Falls - not fall safe.  They wear seatbelts.  firearms are present in the home, kept in a safe fashion. There is no violence in the home.   There is no risks for hepatitis, STDs or HIV. There is no history of blood transfusion. They have no travel history to infectious disease endemic areas of the world.  The patient has seen their dentist in the last six month. They have seen their eye doctor in the last year. They deny any hearing difficulty and have not had audiologic testing in the last year.  They do not  have excessive sun exposure. Discussed the need for sun protection: hats, long sleeves and use of sunscreen if there is significant sun exposure.   Diet: the importance of a healthy diet is discussed. They do have a healthy diet.  The  patient has a regular exercise program: walk, exercise equip at home , 45 min duration, 3-4 per week.  The benefits of regular aerobic exercise were discussed.  Depression screen: there are no signs or vegative symptoms of depression- irritability, change in appetite, anhedonia, sadness/tearfullness.  Cognitive assessment: the patient manages all their financial and personal affairs and is actively engaged.   The following portions of the patient's history were reviewed and updated as appropriate: allergies, current medications, past family history, past medical history,  past surgical history, past social history  and problem list.  Vision, hearing, body mass index were assessed and reviewed.   During the course of the visit the patient was educated and counseled about appropriate screening and preventive services including : fall prevention , diabetes screening, nutrition counseling, colorectal cancer screening, and recommended immunizations.   Past Medical History: Last updated: 10/06/2006 Anemia-NOS Diabetes mellitus, type II GERD Hyperlipidemia Hypertension Hypothyroidism Cleft palet Colon polyps CVA Esophageal Strictures r Thyroid cyst Psoriasis Chronic hearing loss Hypogonadism DJD of Cervical Spine  Past Surgical History:  EGD "68"  Family History:  father- deceased @ 47: DM, gout, CAD mother-deceased #@ 66: no chronic disease recorded Neg - colon, prostate cancer  Social History:  HSG, [employer] - BS Patent attorney, MS computer science married '63 3 sons - ''54, '67, '35 middle  son is a cardiologist in [place]; youngest a  lawye; 9 grandchildren retired: [employer] marriage is in good health I-ADLs      Review of Systems Constitutional:  Negative for fever, chills, activity change and unexpected weight change.  HEENT:  Negative for hearing loss, ear pain, congestion, neck stiffness and postnasal drip. Negative for sore throat or  swallowing problems. Negative for dental complaints.   Eyes: Negative for vision loss or change in visual acuity.  Respiratory: Negative for chest tightness and wheezing. Negative for DOE.   Cardiovascular: Negative for chest pain or palpitations. No decreased exercise tolerance Gastrointestinal: No change in bowel habit. No bloating or gas. No reflux or indigestion Genitourinary: Negative for urgency, frequency, flank pain and difficulty urinating.  Musculoskeletal: Negative for myalgias, back pain, arthralgias and gait problem.  Neurological: Negative for dizziness, tremors, weakness and headaches.  Hematological: Negative for adenopathy.  Psychiatric/Behavioral: Negative for behavioral problems and dysphoric mood.       Objective:   Physical Exam . Filed Vitals:   07/31/11 1332  BP: 122/78  Pulse: 63  Temp: 97.3 F (36.3 C)  Resp: 16   Wt Readings from Last 3 Encounters:  07/31/11 198 lb (89.812 kg)  05/31/10 203 lb (92.08 kg)  04/12/10 202 lb (91.627 kg)    Gen'l: Well nourished well developed whiter male in no acute distress  HEENT: Head: Normocephalic and atraumatic. Right Ear: hearing aid in place. Left Ear: hearing aid in place. Nose: Nose normal. Mouth/Throat: Oropharynx is clear and moist. Dentition - native, in good repair. No buccal or palatal lesions. Posterior pharynx clear. Eyes: Conjunctivae and sclera clear. EOM intact. Pupils are equal, round, and reactive to light. Right eye exhibits no discharge. Left eye exhibits no discharge. Neck: Normal range of motion. Neck supple. No JVD present. No tracheal deviation present. No thyromegaly present.  Cardiovascular: Normal rate, regular rhythm, no gallop, no friction rub, no murmur heard.      Quiet precordium. 2+ radial and DP pulses . No carotid bruits Pulmonary/Chest: Effort normal. No respiratory distress or increased WOB, no wheezes, no rales. No chest wall deformity or CVAT. Abdominal: Soft. Bowel sounds are normal  in all quadrants. He exhibits no distension, no tenderness, no rebound or guarding, No heptosplenomegaly  Genitourinary: deferred  Musculoskeletal: Normal range of motion. He exhibits no edema and no tenderness.       Small and large joints without redness, synovial thickening or deformity. Full range of motion preserved about all median and large joints.  Decreased movement small joints hands.  Lymphadenopathy:    He has no cervical or supraclavicular adenopathy.  Neurological: He is alert and oriented to person, place, and time. CN II-XII intact. DTRs 2+ and symmetrical biceps, radial and patellar tendons. Cerebellar function normal with no tremor, rigidity, normal gait and station.  Skin: Skin is warm and dry. No rash noted. No erythema.  Psychiatric: He has a normal mood and affect. His behavior is normal. Thought content normal.   Lab Results  Component Value Date                       GLUCOSE 94 07/31/2011   CHOL 119 07/31/2011   TRIG 105.0 07/31/2011   HDL 38.00* 07/31/2011   LDLCALC 60 07/31/2011        ALT 18 07/31/2011   AST 22 07/31/2011        NA 141 07/31/2011   K 4.0 07/31/2011   CL 102 07/31/2011   CREATININE 1.0 07/31/2011  BUN 17 07/31/2011   CO2 28 07/31/2011   TSH 4.14 07/31/2011   PSA 0.73 05/14/2009   HGBA1C 6.2 07/31/2011       Testosterone          60   Imaging of shoulders and hands due to chronic discomfort: rule out psoriatic erosions  IMPRESSION: right shoulder No right shoulder abnormality is identified.  IMPRESSION: left shoulder Slight narrowing of AC joint space. No fracture or bony  destruction is seen. No acute or active abnormality is identified.  IMPRESSION: right hand No erosion. Only mild degenerative change of the DIP joints is  Noted.  IMPRESSION: left hand No erosion. Mild degenerative change of the DIP joints.           Assessment & Plan:

## 2011-08-01 DIAGNOSIS — M1990 Unspecified osteoarthritis, unspecified site: Secondary | ICD-10-CM | POA: Insufficient documentation

## 2011-08-01 DIAGNOSIS — Z0000 Encounter for general adult medical examination without abnormal findings: Secondary | ICD-10-CM | POA: Insufficient documentation

## 2011-08-01 LAB — TESTOSTERONE, FREE, TOTAL, SHBG: Testosterone, Free: 12.6 pg/mL — ABNORMAL LOW (ref 47.0–244.0)

## 2011-08-01 NOTE — Assessment & Plan Note (Signed)
Excellent control with LDL being much better than goal of 100 or less.  Plan Continue present medication and dose.

## 2011-08-01 NOTE — Assessment & Plan Note (Signed)
BP Readings from Last 3 Encounters:  07/31/11 122/78  05/31/10 118/78  04/12/10 120/78   Excellent control.  Plan Continue present regimen

## 2011-08-01 NOTE — Assessment & Plan Note (Signed)
Stiff and painful hands, worse in AM. Discomfort in both shoulders w/o loss of ROM. X-ray survey w/o joint damage or erosions.  Plan  Heat  Range of motion: shoulder exercise; hands - small ball work.  NSAIDs as needed, e.g. Aleve or Advil

## 2011-08-01 NOTE — Assessment & Plan Note (Signed)
Reviewed previous years A1C back to '09 - runs 6.3% on average - above normal of 6%. On no medications  Plan - continued management with life-style alone: no sugar low carb diet and exercise.

## 2011-08-01 NOTE — Assessment & Plan Note (Signed)
Patient reports he can attain but not maintain erection. He has had only minimal benefit from Cialis.  Plan - testosterone replacement - trial of Axiron. Rx to be mailed.

## 2011-08-01 NOTE — Assessment & Plan Note (Signed)
Generally well controlled on PPI therapy. Discussed a transition to H2 blocker, e.g. Ranitidine: he would start ranitidine 150 mg twice a day. After a week he can reduce PPI, omeprazole 20 mg, to every other day for 10 days then stop. He may still suffer some rebound acidity. Furthermore, having had an esophageal stricture in the past he would need to be very attentive to any change in symptoms off of omeprazole.

## 2011-08-01 NOTE — Assessment & Plan Note (Signed)
Followed by dermatology - use of topical steroid creams as needed.

## 2011-08-01 NOTE — Assessment & Plan Note (Signed)
Interval medical history is notable for mild joint pain and stiffness affecting both hands and shoulder; continued ED. No major illness, injury or surgery. Continues to consult with dermatology and ophthalmology. Physical exam is normal. Lab results are in normal range except for low testosterone. Will pull old chart to research last colonoscopy. Last PSA was normal. Per American College of Urology guidelines '13: will not do additional screening after 70. Immunizations: due for tetanus booster - last done '97; pnemonia vaccine is current; will research Shingles vaccine administration.  In general - a nice man who is medically stable and doing well. He will continue his healthy life-style which has kept his diabetes in control. He will return in 1 year or sooner as needed.

## 2011-08-01 NOTE — Assessment & Plan Note (Signed)
Lab Results  Component Value Date   TSH 4.14 07/31/2011   Good control. No change in medication

## 2011-11-01 ENCOUNTER — Telehealth: Payer: Self-pay | Admitting: Internal Medicine

## 2011-11-01 NOTE — Telephone Encounter (Signed)
Caller: Rahul/Patient; PCP: Illene Regulus; CB#: [phone]; ; ; Call regarding Multiple Requests, No Symptoms; States he was seen in the office for his yearly physical on 08/01/11, was told his testosterone was low and Dr. Debby Bud told him he would mail him a prescription for a trial of Axiron, states he never got that prescription and needs it mailed to him so he can send it to [facility] mail order [facility].  Also states he would like a hard copy of his labwork mailed to him.  Patient states he was also told he needed a tetanus booster, states he had a TDAP booster on 10/21/10 at [facility], so that should be up to date, would like his record updated.  Denies any symptoms requiring triage.  Please let patient know if you have any questions or problems.

## 2011-11-02 ENCOUNTER — Other Ambulatory Visit: Payer: Self-pay | Admitting: *Deleted

## 2011-11-02 MED ORDER — TESTOSTERONE 30 MG/ACT TD SOLN: 30.0000 mg | Freq: Every day | TRANSDERMAL | Status: DC

## 2011-11-02 NOTE — Telephone Encounter (Signed)
PRINTED Rx FOR AXIRON  TO BE SIGNED AND MAILED TO PATIENT WITH STARTER GUIDE  INFO. IMMUNIZATION TDAP UPDATE IN RECORD. LAB RESULTS PRINTED  TO BE MAILED TO PATIENT ADDRESS IN SYSTEM.

## 2011-11-02 NOTE — Telephone Encounter (Signed)
1. Ok for refill on Axiron 2. Please update immunization information 3. OK to send hard copy of his lab

## 2011-11-29 ENCOUNTER — Telehealth: Payer: Self-pay

## 2011-11-29 MED ORDER — TESTOSTERONE 30 MG/ACT TD SOLN: 30.0000 mg | Freq: Every day | TRANSDERMAL | Status: DC

## 2011-11-29 NOTE — Telephone Encounter (Signed)
Pt called requesting a new Rx for Axiron for a 90 day quantity per [employer]. Pt is requesting Rx be mailed to address on file and he be notified once sent.

## 2011-11-29 NOTE — Telephone Encounter (Signed)
Rx printed and awaiting MD signature 

## 2011-11-29 NOTE — Telephone Encounter (Signed)
k

## 2011-11-30 NOTE — Telephone Encounter (Signed)
Rx signed and mailed, pt advised of same.

## 2012-01-30 ENCOUNTER — Ambulatory Visit (INDEPENDENT_AMBULATORY_CARE_PROVIDER_SITE_OTHER): Payer: Medicare Other

## 2012-01-30 DIAGNOSIS — Z23 Encounter for immunization: Secondary | ICD-10-CM

## 2012-01-31 DIAGNOSIS — B351 Tinea unguium: Secondary | ICD-10-CM | POA: Diagnosis not present

## 2012-01-31 DIAGNOSIS — L738 Other specified follicular disorders: Secondary | ICD-10-CM | POA: Diagnosis not present

## 2012-01-31 DIAGNOSIS — L408 Other psoriasis: Secondary | ICD-10-CM | POA: Diagnosis not present

## 2012-01-31 DIAGNOSIS — L570 Actinic keratosis: Secondary | ICD-10-CM | POA: Diagnosis not present

## 2012-04-03 DIAGNOSIS — L738 Other specified follicular disorders: Secondary | ICD-10-CM | POA: Diagnosis not present

## 2012-04-03 DIAGNOSIS — L408 Other psoriasis: Secondary | ICD-10-CM | POA: Diagnosis not present

## 2012-04-03 DIAGNOSIS — L819 Disorder of pigmentation, unspecified: Secondary | ICD-10-CM | POA: Diagnosis not present

## 2012-05-01 ENCOUNTER — Encounter: Payer: Self-pay | Admitting: Gastroenterology

## 2012-05-17 ENCOUNTER — Encounter: Payer: Self-pay | Admitting: Family Medicine

## 2012-05-17 DIAGNOSIS — L4050 Arthropathic psoriasis, unspecified: Secondary | ICD-10-CM | POA: Insufficient documentation

## 2012-05-28 ENCOUNTER — Telehealth: Payer: Self-pay | Admitting: *Deleted

## 2012-05-28 MED ORDER — FUROSEMIDE 20 MG PO TABS: 20.0000 mg | ORAL_TABLET | Freq: Every day | ORAL | Status: DC

## 2012-05-28 MED ORDER — LOVASTATIN 40 MG PO TABS: 40.0000 mg | ORAL_TABLET | Freq: Every day | ORAL | Status: DC

## 2012-05-28 MED ORDER — ENALAPRIL MALEATE 10 MG PO TABS: 10.0000 mg | ORAL_TABLET | Freq: Every day | ORAL | Status: DC

## 2012-05-28 NOTE — Telephone Encounter (Signed)
Ok for refills

## 2012-05-29 ENCOUNTER — Other Ambulatory Visit: Payer: Self-pay | Admitting: *Deleted

## 2012-05-29 MED ORDER — ENALAPRIL MALEATE 10 MG PO TABS: 10.0000 mg | ORAL_TABLET | Freq: Every day | ORAL | Status: DC

## 2012-05-29 MED ORDER — LOVASTATIN 40 MG PO TABS: 40.0000 mg | ORAL_TABLET | Freq: Every day | ORAL | Status: DC

## 2012-05-29 MED ORDER — FUROSEMIDE 20 MG PO TABS: 20.0000 mg | ORAL_TABLET | Freq: Every day | ORAL | Status: DC

## 2012-07-01 ENCOUNTER — Telehealth: Payer: Self-pay | Admitting: Internal Medicine

## 2012-07-01 NOTE — Telephone Encounter (Signed)
Mr. Adarius May physical has been rescheduled to June 25.  He is out of Testesterone medicine.  He has been using Axiron once a day.  It seems to be working, but it is $400.00 at [facility].  He wants to know if something else can be called in that is cheaper.  His pharmacy is [facility] on [address]. Please call him to let him know.

## 2012-07-01 NOTE — Telephone Encounter (Signed)
Will check with Dr. Posey Rea - I recall there is a compounding pharmacy that will prepare topical testosterone at a much better price.

## 2012-07-02 NOTE — Telephone Encounter (Signed)
As I recall [facility] can mix "Micronized Crys Testosterone gel 1%" (50 mg/ml) - use 1 ml on skin qam. 3 bottles Thx

## 2012-07-03 NOTE — Telephone Encounter (Signed)
Thank you Dr. Posey Rea.  Georgiann Hahn - please let the patient know we are calling this in to [facility]. Thanks

## 2012-07-03 NOTE — Telephone Encounter (Signed)
Phone call to pt letting him know the Testosterone Gel 1% can be called in to [facility] (not [facility] on [address] because they do not mix it). He states before this is called in he is going to call [place] to see how much this will cost. Patient states he can only use [facility] on [address] due to his ins. Will await pt's phone call back

## 2012-07-08 ENCOUNTER — Telehealth: Payer: Self-pay | Admitting: Internal Medicine

## 2012-07-08 NOTE — Telephone Encounter (Signed)
Patient is ready to have the testosterone called in to [facility], call patient once this is done

## 2012-07-08 NOTE — Telephone Encounter (Signed)
Prescription called to [facility] and pt notified via voicemail.

## 2012-07-09 NOTE — Telephone Encounter (Signed)
Testosterone gel has been called to [facility] yesterday.

## 2012-07-24 DIAGNOSIS — H0289 Other specified disorders of eyelid: Secondary | ICD-10-CM | POA: Diagnosis not present

## 2012-07-24 DIAGNOSIS — H2510 Age-related nuclear cataract, unspecified eye: Secondary | ICD-10-CM | POA: Diagnosis not present

## 2012-08-01 ENCOUNTER — Encounter: Payer: Medicare Other | Admitting: Internal Medicine

## 2012-08-23 ENCOUNTER — Telehealth: Payer: Self-pay

## 2012-08-23 MED ORDER — NIFEDIPINE ER OSMOTIC RELEASE 60 MG PO TB24: 60.0000 mg | ORAL_TABLET | Freq: Every day | ORAL | Status: DC

## 2012-08-23 NOTE — Telephone Encounter (Signed)
Patient calls requesting a refill on Nifedipine ER 60 mg be sent to [facility] on [address]. He has an appt on 09/18/12 with Dr Debby Bud. I called patient and let him know this was called in.

## 2012-09-13 DIAGNOSIS — L819 Disorder of pigmentation, unspecified: Secondary | ICD-10-CM | POA: Diagnosis not present

## 2012-09-13 DIAGNOSIS — L408 Other psoriasis: Secondary | ICD-10-CM | POA: Diagnosis not present

## 2012-09-18 ENCOUNTER — Telehealth: Payer: Self-pay

## 2012-09-18 ENCOUNTER — Ambulatory Visit
Admission: RE | Admit: 2012-09-18 | Discharge: 2012-09-18 | Disposition: A | Discharge status: Home / Self Care | Payer: Medicare Other | Source: Ambulatory Visit | Attending: Internal Medicine | Admitting: Internal Medicine

## 2012-09-18 ENCOUNTER — Ambulatory Visit (INDEPENDENT_AMBULATORY_CARE_PROVIDER_SITE_OTHER): Payer: Medicare Other | Admitting: Internal Medicine

## 2012-09-18 ENCOUNTER — Other Ambulatory Visit (INDEPENDENT_AMBULATORY_CARE_PROVIDER_SITE_OTHER): Payer: Medicare Other

## 2012-09-18 ENCOUNTER — Encounter: Payer: Self-pay | Admitting: Internal Medicine

## 2012-09-18 VITALS — BP 120/80 | HR 54 | Temp 96.8°F | Resp 12 | Ht 67.0 in | Wt 203.8 lb

## 2012-09-18 DIAGNOSIS — E039 Hypothyroidism, unspecified: Secondary | ICD-10-CM | POA: Diagnosis not present

## 2012-09-18 DIAGNOSIS — E559 Vitamin D deficiency, unspecified: Secondary | ICD-10-CM | POA: Diagnosis not present

## 2012-09-18 DIAGNOSIS — Z125 Encounter for screening for malignant neoplasm of prostate: Secondary | ICD-10-CM

## 2012-09-18 DIAGNOSIS — Z0000 Encounter for general adult medical examination without abnormal findings: Secondary | ICD-10-CM

## 2012-09-18 DIAGNOSIS — K219 Gastro-esophageal reflux disease without esophagitis: Secondary | ICD-10-CM

## 2012-09-18 DIAGNOSIS — E785 Hyperlipidemia, unspecified: Secondary | ICD-10-CM

## 2012-09-18 DIAGNOSIS — E291 Testicular hypofunction: Secondary | ICD-10-CM

## 2012-09-18 DIAGNOSIS — I10 Essential (primary) hypertension: Secondary | ICD-10-CM | POA: Diagnosis not present

## 2012-09-18 DIAGNOSIS — E041 Nontoxic single thyroid nodule: Secondary | ICD-10-CM | POA: Diagnosis not present

## 2012-09-18 DIAGNOSIS — E119 Type 2 diabetes mellitus without complications: Secondary | ICD-10-CM

## 2012-09-18 DIAGNOSIS — L408 Other psoriasis: Secondary | ICD-10-CM

## 2012-09-18 LAB — HEPATIC FUNCTION PANEL
ALT: 26 U/L (ref 0–53)
AST: 28 U/L (ref 0–37)
Albumin: 4.6 g/dL (ref 3.5–5.2)
Total Bilirubin: 0.7 mg/dL (ref 0.3–1.2)

## 2012-09-18 LAB — TESTOSTERONE: Testosterone: 186.83 ng/dL — ABNORMAL LOW (ref 350.00–890.00)

## 2012-09-18 LAB — LIPID PANEL
Cholesterol: 123 mg/dL (ref 0–200)
HDL: 31.1 mg/dL — ABNORMAL LOW (ref >39.00)
Triglycerides: 90 mg/dL (ref 0.0–149.0)
VLDL: 18 mg/dL (ref 0.0–40.0)

## 2012-09-18 LAB — PSA: PSA: 0.39 ng/mL (ref 0.10–4.00)

## 2012-09-18 MED ORDER — NIFEDIPINE ER OSMOTIC RELEASE 60 MG PO TB24: 60.0000 mg | ORAL_TABLET | Freq: Every day | ORAL | Status: DC

## 2012-09-18 MED ORDER — TESTOSTERONE 30 MG/ACT TD SOLN: 30.0000 mg | Freq: Every day | TRANSDERMAL | Status: DC

## 2012-09-18 MED ORDER — LOVASTATIN 40 MG PO TABS: 40.0000 mg | ORAL_TABLET | Freq: Every day | ORAL | Status: DC

## 2012-09-18 MED ORDER — LABETALOL HCL 100 MG PO TABS: 100.0000 mg | ORAL_TABLET | Freq: Two times a day (BID) | ORAL | Status: DC

## 2012-09-18 MED ORDER — FLUTICASONE PROPIONATE 50 MCG/ACT NA SUSP: 2.0000 | Freq: Every day | NASAL | Status: DC

## 2012-09-18 MED ORDER — ENALAPRIL MALEATE 10 MG PO TABS: 10.0000 mg | ORAL_TABLET | Freq: Every day | ORAL | Status: DC

## 2012-09-18 MED ORDER — FUROSEMIDE 20 MG PO TABS: 20.0000 mg | ORAL_TABLET | Freq: Every day | ORAL | Status: DC

## 2012-09-18 NOTE — Progress Notes (Signed)
Subjective:    Patient ID: Trevor Cooke, male    DOB: [birth year], 72 y.o.   MRN: [id number]  HPI The patient is here for annual Medicare wellness examination and management of other chronic and acute problems. He has been feeling well with no major medical events, surgery or injury.    The risk factors are reflected in the social history.  The roster of all physicians providing medical care to patient - is listed in the Snapshot section of the chart.  Activities of daily living:  The patient is 100% inedpendent in all ADLs: dressing, toileting, feeding as well as independent mobility  Home safety : The patient has smoke detectors in the home. Falls - no falls. They wear seatbelts. firearms are present in the home, kept in a safe fashion. There is no violence in the home.   There is no risks for hepatitis, STDs or HIV. There is no   history of blood transfusion. They have no travel history to infectious disease endemic areas of the world.  The patient has seen their dentist in the last six month. They have seen their eye doctor in the last year. They admit to any hearing difficulty and have not had audiologic testing in the last year.    They do not  have excessive sun exposure. Discussed the need for sun protection: hats, long sleeves and use of sunscreen if there is significant sun exposure.   Diet: the importance of a healthy diet is discussed. They do have a healthy diet.  The patient has no regular exercise program.  The benefits of regular aerobic exercise were discussed.  Depression screen: there are no signs or vegative symptoms of depression- irritability, change in appetite, anhedonia, sadness/tearfullness.  Cognitive assessment: the patient manages all their financial and personal affairs and is actively engaged.   The following portions of the patient's history were reviewed and updated as appropriate: allergies, current medications, past family history, past medical  history,  past surgical history, past social history  and problem list.  Vision, hearing, body mass index were assessed and reviewed.   During the course of the visit the patient was educated and counseled about appropriate screening and preventive services including : fall prevention , diabetes screening, nutrition counseling, colorectal cancer screening, and recommended immunizations.  History reviewed. No pertinent past medical history. History reviewed. No pertinent past surgical history. History reviewed. No pertinent family history. History   Social History  . Marital Status: Married    Spouse Name: N/A    Number of Children: N/A  . Years of Education: N/A   Occupational History  . Not on file.   Social History Main Topics  . Smoking status: Former Games developer  . Smokeless tobacco: Not on file  . Alcohol Use: No  . Drug Use: No  . Sexually Active: Not on file   Other Topics Concern  . Not on file   Social History Narrative  . No narrative on file    Current Outpatient Prescriptions on File Prior to Visit  Medication Sig Dispense Refill  . aspirin 81 MG tablet Take 81 mg by mouth daily.      . calcium carbonate (OS-CAL) 600 MG TABS Take 600 mg by mouth 2 (two) times daily with a meal.      . clobetasol ointment (TEMOVATE) 0.05 % Apply topically 2 (two) times daily.      Marland Kitchen desonide (DESOWEN) 0.05 % ointment Apply topically 2 (two) times daily.      Marland Kitchen  enalapril (VASOTEC) 10 MG tablet Take 1 tablet (10 mg total) by mouth daily.  90 tablet  1  . fluocinonide (LIDEX) 0.05 % external solution Apply topically daily. Apply to scalp to treat affected areas      . furosemide (LASIX) 20 MG tablet Take 1 tablet (20 mg total) by mouth daily.  90 tablet  1  . lovastatin (MEVACOR) 40 MG tablet Take 1 tablet (40 mg total) by mouth at bedtime.  90 tablet  1  . multivitamin (THERAGRAN) per tablet Take 1 tablet by mouth daily.      Marland Kitchen NIFEdipine (PROCARDIA XL/ADALAT-CC) 60 MG 24 hr tablet  Take 1 tablet (60 mg total) by mouth daily.  90 tablet  0  . omeprazole (PRILOSEC) 20 MG capsule Take 20 mg by mouth daily.      . Testosterone (AXIRON) 30 MG/ACT SOLN Place 30 mg onto the skin daily. UNDER ARMPITS  90 mL  1  . Vitamin Mixture (ESTER-C PO) Take 500 mg by mouth daily.      . fluticasone (FLONASE) 50 MCG/ACT nasal spray Place 2 sprays into the nose daily.  32 g  5  . labetalol (NORMODYNE) 100 MG tablet Take 1 tablet (100 mg total) by mouth 2 (two) times daily.  180 tablet  3   No current facility-administered medications on file prior to visit.       Review of Systems Constitutional:  Negative for fever, chills, activity change and unexpected weight change.  HEENT:  Negative for hearing loss, ear pain, congestion, neck stiffness and postnasal drip. Negative for sore throat or swallowing problems. Negative for dental complaints.   Eyes: Negative for vision loss or change in visual acuity.  Respiratory: Negative for chest tightness and wheezing. Negative for DOE.   Cardiovascular: Negative for chest pain or palpitations. No decreased exercise tolerance Gastrointestinal: No change in bowel habit. No bloating or gas. No reflux or indigestion Genitourinary: Negative for urgency, frequency, flank pain and difficulty urinating.  Musculoskeletal: Negative for myalgias, back pain, arthralgias and gait problem.  Neurological: Negative for dizziness, tremors, weakness and headaches. HOH Hematological: Negative for adenopathy.  Psychiatric/Behavioral: Negative for behavioral problems and dysphoric mood.       Objective:   Physical Exam Filed Vitals:   09/18/12 1002  BP: 120/80  Pulse: 54  Temp: 96.8 F (36 C)  Resp: 12   Wt Readings from Last 3 Encounters:  09/18/12 203 lb 12.8 oz (92.443 kg)  07/31/11 198 lb (89.812 kg)  05/31/10 203 lb (92.08 kg)   Gen'l: Well nourished well developed white male in no acute distress  HEENT: Head: Normocephalic and atraumatic. Right  ION:GEXBMWU aide in. Left Ear: hearing aid in. Nose: Nose normal. Mouth/Throat: Oropharynx is clear and moist. Dentition - native, in good repair. No buccal or palatal lesions. Posterior pharynx clear. Eyes: Conjunctivae and sclera clear. EOM intact. Pupils are equal, round, and reactive to light. Right eye exhibits no discharge. Left eye exhibits no discharge. Neck: Normal range of motion. Neck supple. No JVD present. No tracheal deviation present. Modest thyromegaly present R>L, no nodularity palpated.  Cardiovascular: Normal rate, regular rhythm, no gallop, no friction rub, no murmur heard.      Quiet precordium. 2+ radial and DP pulses . No carotid bruits Pulmonary/Chest: Effort normal. No respiratory distress or increased WOB, no wheezes, no rales. No chest wall deformity or CVAT. Abdomen: Soft. Bowel sounds are normal in all quadrants. He exhibits no distension, no tenderness, no rebound or  guarding, No heptosplenomegaly  Genitourinary:  Rectal - NST, no masses in vault, prostate normal in size w/o palpable abnormality Musculoskeletal: Normal range of motion. He exhibits no edema and no tenderness.       Small and large joints without redness, synovial thickening or deformity. Full range of motion preserved about all small, median and large joints.  Lymphadenopathy:    He has no cervical or supraclavicular adenopathy.  Neurological: He is alert and oriented to person, place, and time. CN II-XII intact. DTRs 2+ and symmetrical biceps, radial and patellar tendons. Cerebellar function normal with no tremor, rigidity, normal gait and station.  Skin: Skin is warm and dry. No rash noted. Psoriatic plaque on elbows. Full body exam deferred to dermatology Psychiatric: He has a normal mood and affect. His behavior is normal. Thought content normal.   Recent Results (from the past 2160 hour(s))  LIPID PANEL     Status: Abnormal   Collection Time    09/18/12 11:07 AM      Result Value Range    Cholesterol 123  0 - 200 mg/dL   Comment: ATP III Classification       Desirable:  < 200 mg/dL               Borderline High:  200 - 239 mg/dL          High:  > = 161 mg/dL   Triglycerides 09.6  0.0 - 149.0 mg/dL   Comment: Normal:  <045 mg/dLBorderline High:  150 - 199 mg/dL   HDL 40.98 (*) >11.91 mg/dL   VLDL 47.8  0.0 - 29.5 mg/dL   LDL Cholesterol 74  0 - 99 mg/dL   Total CHOL/HDL Ratio 4     Comment:                Men          Women1/2 Average Risk     3.4          3.3Average Risk          5.0          4.42X Average Risk          9.6          7.13X Average Risk          15.0          11.0                      TSH     Status: Abnormal   Collection Time    09/18/12 11:07 AM      Result Value Range   TSH 5.72 (*) 0.35 - 5.50 uIU/mL  HEPATIC FUNCTION PANEL     Status: None   Collection Time    09/18/12 11:07 AM      Result Value Range   Total Bilirubin 0.7  0.3 - 1.2 mg/dL   Bilirubin, Direct 0.1  0.0 - 0.3 mg/dL   Alkaline Phosphatase 83  39 - 117 U/L   AST 28  0 - 37 U/L   ALT 26  0 - 53 U/L   Total Protein 8.2  6.0 - 8.3 g/dL   Albumin 4.6  3.5 - 5.2 g/dL  TESTOSTERONE     Status: Abnormal   Collection Time    09/18/12 11:07 AM      Result Value Range   Testosterone 186.83 (*) 350.00 - 890.00 ng/dL  PSA     Status:  None   Collection Time    09/18/12 11:07 AM      Result Value Range   PSA 0.39  0.10 - 4.00 ng/mL  T4, FREE     Status: None   Collection Time    09/18/12 11:07 AM      Result Value Range   Free T4 0.77  0.60 - 1.60 ng/dL           Assessment & Plan:

## 2012-09-18 NOTE — Assessment & Plan Note (Signed)
BP Readings from Last 3 Encounters:  09/18/12 120/80  07/31/11 122/78  05/31/10 118/78   Great control of blood pressure on present regimen which is well tolerated.  Plan Continue present medications.

## 2012-09-18 NOTE — Assessment & Plan Note (Signed)
Interval medical history is benign. Physical exam is normal. He is due for colonoscopy - no record of previous study in EPIC. Immunizations are up to date but though patient reports having had shingles vaccine no record is in EPIC. Discussed pros and cons of prostate cancer screening (USPHCTF recommendations reviewed and ACU April '13 recommendations) and he requests evaluation at this time even though aged out: normal PSA and normal DRE. He is curious about Vit D level due to thyroid disease - lab pending.  In summary - a well read man with strong opinions who is a good debater, is feeling fit and appears medically stable. He will return in 1 year or sooner as needed.

## 2012-09-18 NOTE — Assessment & Plan Note (Signed)
Very stable on long term PPI therapy

## 2012-09-18 NOTE — Assessment & Plan Note (Signed)
Lab reveals very good control with LDL better than goal of 100 or less, HDL is too low with goal of 40+, liver functions are normal  Plan Continue healthy life-style  Continue lovastatin

## 2012-09-18 NOTE — Assessment & Plan Note (Signed)
Patient using compounded testosterone gel from [employer]. He reports that he feels fine and that his testosterone dosing is working for him.  PLan F/u testosterone level  Addendum - testosterone level below normal range but he is symptomatically fine  Plan Continue present regimen.

## 2012-09-18 NOTE — Assessment & Plan Note (Signed)
Follows with dermatologist several times a year. He has changes at Elbows and knees and scalp.

## 2012-09-18 NOTE — Assessment & Plan Note (Addendum)
Mr. Conover reports that he has had some minor swallow difficulty.  Plan  Thyroid U/s  Addendum - thyroid u/s w/o significant change from previous study.  TSH minimally elevated with normal range FT4  Plan No indication for medication at this time.

## 2012-09-18 NOTE — Assessment & Plan Note (Signed)
He has maintained normal range A1C for 5 years, takes no medication.    Plan Continue healthy diet and regular physical activity.

## 2012-09-18 NOTE — Telephone Encounter (Signed)
Fax from [facility]. Patient was given a hard script today(at his appt) for Axiron but patient would like to continue taking this compound, Testosterone 50 mg/ml Lipo 90 gm apply 1 ml (4 clicks) daily. Axiron is not covered by his insurance. Please advise.

## 2012-09-18 NOTE — Telephone Encounter (Signed)
Remove axiron form med list. OK to refill compounded product from [facility]

## 2012-09-18 NOTE — Patient Instructions (Signed)
Thanks for coming to see me.  Full report to follow

## 2012-09-19 MED ORDER — UNABLE TO FIND: Status: DC

## 2012-09-19 NOTE — Telephone Encounter (Signed)
rx called to [facility]

## 2012-09-23 ENCOUNTER — Telehealth: Payer: Self-pay

## 2012-09-23 NOTE — Telephone Encounter (Signed)
Message copied by Noreene Larsson on Mon Sep 23, 2012 11:08 AM ------      Message from: Jacques Navy      Created: Sun Sep 22, 2012 10:03 PM       Please call patient - Vit D is robust at 32 - just a bit higher than normal range. Recommend Vit D 800 iu daily thanks ------

## 2012-09-23 NOTE — Telephone Encounter (Signed)
Left message on voicemail about results and taking Vit D 800 units and to call with any questions

## 2012-09-26 ENCOUNTER — Encounter: Payer: Self-pay | Admitting: Internal Medicine

## 2012-09-26 ENCOUNTER — Other Ambulatory Visit: Payer: Self-pay | Admitting: Internal Medicine

## 2012-09-26 DIAGNOSIS — E119 Type 2 diabetes mellitus without complications: Secondary | ICD-10-CM

## 2012-10-25 ENCOUNTER — Other Ambulatory Visit (INDEPENDENT_AMBULATORY_CARE_PROVIDER_SITE_OTHER): Payer: Medicare Other

## 2012-10-25 DIAGNOSIS — E119 Type 2 diabetes mellitus without complications: Secondary | ICD-10-CM | POA: Diagnosis not present

## 2012-12-26 ENCOUNTER — Other Ambulatory Visit: Payer: Self-pay

## 2012-12-27 MED ORDER — UNABLE TO FIND: Status: DC

## 2012-12-31 ENCOUNTER — Encounter: Payer: Self-pay | Admitting: Gastroenterology

## 2013-01-27 ENCOUNTER — Ambulatory Visit (INDEPENDENT_AMBULATORY_CARE_PROVIDER_SITE_OTHER): Payer: Medicare Other

## 2013-01-27 DIAGNOSIS — Z23 Encounter for immunization: Secondary | ICD-10-CM

## 2013-02-11 DIAGNOSIS — D235 Other benign neoplasm of skin of trunk: Secondary | ICD-10-CM | POA: Diagnosis not present

## 2013-02-11 DIAGNOSIS — L408 Other psoriasis: Secondary | ICD-10-CM | POA: Diagnosis not present

## 2013-02-11 DIAGNOSIS — L738 Other specified follicular disorders: Secondary | ICD-10-CM | POA: Diagnosis not present

## 2013-02-11 DIAGNOSIS — D485 Neoplasm of uncertain behavior of skin: Secondary | ICD-10-CM | POA: Diagnosis not present

## 2013-02-17 ENCOUNTER — Ambulatory Visit (AMBULATORY_SURGERY_CENTER): Payer: Self-pay

## 2013-02-17 VITALS — Ht 68.0 in | Wt 196.0 lb

## 2013-02-17 DIAGNOSIS — Z860100 Personal history of colon polyps, unspecified: Secondary | ICD-10-CM

## 2013-02-17 MED ORDER — SUPREP BOWEL PREP KIT 17.5-3.13-1.6 GM/177ML PO SOLN: 1.0000 | Freq: Once | ORAL | Status: DC

## 2013-02-21 ENCOUNTER — Encounter: Payer: Self-pay | Admitting: Internal Medicine

## 2013-03-06 ENCOUNTER — Ambulatory Visit (AMBULATORY_SURGERY_CENTER): Payer: Medicare Other | Admitting: Gastroenterology

## 2013-03-06 ENCOUNTER — Encounter: Payer: Self-pay | Admitting: Gastroenterology

## 2013-03-06 VITALS — BP 139/82 | HR 56 | Temp 97.2°F | Resp 10 | Ht 68.0 in | Wt 196.0 lb

## 2013-03-06 DIAGNOSIS — K5730 Diverticulosis of large intestine without perforation or abscess without bleeding: Secondary | ICD-10-CM

## 2013-03-06 DIAGNOSIS — I10 Essential (primary) hypertension: Secondary | ICD-10-CM | POA: Diagnosis not present

## 2013-03-06 DIAGNOSIS — Z86010 Personal history of colonic polyps: Secondary | ICD-10-CM | POA: Diagnosis not present

## 2013-03-06 DIAGNOSIS — K219 Gastro-esophageal reflux disease without esophagitis: Secondary | ICD-10-CM | POA: Diagnosis not present

## 2013-03-06 DIAGNOSIS — Z1211 Encounter for screening for malignant neoplasm of colon: Secondary | ICD-10-CM | POA: Diagnosis not present

## 2013-03-06 MED ORDER — SODIUM CHLORIDE 0.9 % IV SOLN: 500.0000 mL | INTRAVENOUS | Status: DC

## 2013-03-06 NOTE — Op Note (Signed)
[facility] [address] [place], [address]   COLONOSCOPY PROCEDURE REPORT  PATIENT: Trevor Cooke, Trevor Cooke  MR#: [id number] BIRTHDATE: 15-Aug-1940 , 71  yrs. old GENDER: Male ENDOSCOPIST: Louis Meckel, MD REFERRED BY: PROCEDURE DATE:  03/06/2013 PROCEDURE:   Colonoscopy, diagnostic First Screening Colonoscopy - Avg.  risk and is 50 yrs.  old or older - No.  Prior Negative Screening - Now for repeat screening. 10 or more years since last screening  History of Adenoma - Now for follow-up colonoscopy & has been > or = to 3 yrs.  Yes hx of adenoma.  Has been 3 or more years since last colonoscopy.  Polyps Removed Today? No.  Recommend repeat exam, <10 yrs? No. ASA CLASS:   Class II INDICATIONS:Patient's personal history of colon polyps.  adenomatous polyps 2000.  2004 colonoscopy negative for polyps MEDICATIONS: MAC sedation, administered by CRNA and propofol (Diprivan) 150mg  IV  DESCRIPTION OF PROCEDURE:   After the risks benefits and alternatives of the procedure were thoroughly explained, informed consent was obtained.  A digital rectal exam revealed no abnormalities of the rectum.   The     endoscope was introduced through the anus and advanced to the cecum, which was identified by both the appendix and ileocecal valve. No adverse events experienced.   The quality of the prep was Suprep good  The instrument was then slowly withdrawn as the colon was fully examined.      COLON FINDINGS: Moderate diverticulosis was noted in the sigmoid colon and descending colon.   The colon was otherwise normal. There was no diverticulosis, inflammation, polyps or cancers unless previously stated.  Retroflexed views revealed no abnormalities. The time to cecum=4 minutes 29 seconds.  Withdrawal time=8 minutes 01 seconds.  The scope was withdrawn and the procedure completed. COMPLICATIONS: There were no complications.  ENDOSCOPIC IMPRESSION: 1.   Moderate diverticulosis  was noted in the sigmoid colon and descending colon 2.   The colon was otherwise normal  RECOMMENDATIONS: Given your age, you will not need another colonoscopy for colon cancer screening or polyp surveillance.  These types of tests usually stop around the age 68.   eSigned:  Louis Meckel, MD 03/06/2013 2:15 PM   cc: Jacques Navy, MD and Zelphia Cairo MD   PATIENT NAME:  Trevor Cooke, Trevor Cooke MR#: [id number]

## 2013-03-06 NOTE — Progress Notes (Signed)
Warm compress applied to right wrist by Weston Brass RN, area slightly edematous.

## 2013-03-06 NOTE — Patient Instructions (Signed)
YOU HAD AN ENDOSCOPIC PROCEDURE TODAY AT THE [facility]: Refer to the procedure report that was given to you for any specific questions about what was found during the examination.  If the procedure report does not answer your questions, please call your gastroenterologist to clarify.  If you requested that your care partner not be given the details of your procedure findings, then the procedure report has been included in a sealed envelope for you to review at your convenience later.  YOU SHOULD EXPECT: Some feelings of bloating in the abdomen. Passage of more gas than usual.  Walking can help get rid of the air that was put into your GI tract during the procedure and reduce the bloating. If you had a lower endoscopy (such as a colonoscopy or flexible sigmoidoscopy) you may notice spotting of blood in your stool or on the toilet paper. If you underwent a bowel prep for your procedure, then you may not have a normal bowel movement for a few days.  DIET: Your first meal following the procedure should be a light meal and then it is ok to progress to your normal diet.  A half-sandwich or bowl of soup is an example of a good first meal.  Heavy or fried foods are harder to digest and may make you feel nauseous or bloated.  Likewise meals heavy in dairy and vegetables can cause extra gas to form and this can also increase the bloating.  Drink plenty of fluids but you should avoid alcoholic beverages for 24 hours.  ACTIVITY: Your care partner should take you home directly after the procedure.  You should plan to take it easy, moving slowly for the rest of the day.  You can resume normal activity the day after the procedure however you should NOT DRIVE or use heavy machinery for 24 hours (because of the sedation medicines used during the test).    SYMPTOMS TO REPORT IMMEDIATELY: A gastroenterologist can be reached at any hour.  During normal business hours, 8:30 AM to 5:00 PM Monday through Friday,  call [phone].  After hours and on weekends, please call the GI answering service at [phone] emergency number  who will take a message and have the physician on call contact you.   Following lower endoscopy (colonoscopy or flexible sigmoidoscopy):  Excessive amounts of blood in the stool  Significant tenderness or worsening of abdominal pains  Swelling of the abdomen that is new, acute  Fever of 100F or higher  FOLLOW UP: If any biopsies were taken you will be contacted by phone or by letter within the next 1-3 weeks.  Call your gastroenterologist if you have not heard about the biopsies in 3 weeks.  Our staff will call the home number listed on your records the next business day following your procedure to check on you and address any questions or concerns that you may have at that time regarding the information given to you following your procedure. This is a courtesy call and so if there is no answer at the home number and we have not heard from you through the emergency physician on call, we will assume that you have returned to your regular daily activities without incident.  SIGNATURES/CONFIDENTIALITY: You and/or your care partner have signed paperwork which will be entered into your electronic medical record.  These signatures attest to the fact that that the information above on your After Visit Summary has been reviewed and is understood.  Full responsibility of the   confidentiality of this discharge information lies with you and/or your care-partner.   

## 2013-03-06 NOTE — Progress Notes (Signed)
Patient did not experience any of the following events: a burn prior to discharge; a fall within the facility; wrong site/side/patient/procedure/implant event; or a hospital transfer or hospital admission upon discharge from the facility. (G8907) Patient did not have preoperative order for IV antibiotic SSI prophylaxis. (G8918)  

## 2013-03-06 NOTE — Progress Notes (Signed)
  [facility] Anesthesia Post-op Note  Patient: Trevor Cooke  Procedure(s) Performed: colonoscopy  Patient Location: [facility] - Recovery Area  Anesthesia Type: Deep Sedation/Propofol  Level of Consciousness: awake, oriented and patient cooperative  Airway and Oxygen Therapy: Patient Spontanous Breathing  Post-op Pain: none  Post-op Assessment:  Post-op Vital signs reviewed, Patient's Cardiovascular Status Stable, Respiratory Function Stable, Patent Airway, No signs of Nausea or vomiting and Pain level controlled  Post-op Vital Signs: Reviewed and stable  Complications: No apparent anesthesia complications  Hermon Zea E 2:13 PM

## 2013-03-07 ENCOUNTER — Telehealth: Payer: Self-pay | Admitting: *Deleted

## 2013-03-07 NOTE — Telephone Encounter (Signed)
  Follow up Call-  Call back number 03/06/2013  Post procedure Call Back phone  # [phone]  Permission to leave phone message Yes     Patient questions:  Do you have a fever, pain , or abdominal swelling? no Pain Score  0 *  Have you tolerated food without any problems? yes  Have you been able to return to your normal activities? yes  Do you have any questions about your discharge instructions: Diet   no Medications  no Follow up visit  no  Do you have questions or concerns about your Care? no  Actions: * If pain score is 4 or above: No action needed, pain <4.

## 2013-04-07 ENCOUNTER — Ambulatory Visit (INDEPENDENT_AMBULATORY_CARE_PROVIDER_SITE_OTHER): Payer: Medicare Other

## 2013-04-07 DIAGNOSIS — Z23 Encounter for immunization: Secondary | ICD-10-CM | POA: Diagnosis not present

## 2013-07-23 ENCOUNTER — Encounter: Payer: Self-pay | Admitting: Family Medicine

## 2013-07-23 NOTE — Telephone Encounter (Signed)
Dr.Lowne this patient has not been seen. Please advise      KP

## 2013-07-24 ENCOUNTER — Other Ambulatory Visit: Payer: Self-pay | Admitting: *Deleted

## 2013-07-24 ENCOUNTER — Other Ambulatory Visit: Payer: Self-pay

## 2013-07-24 MED ORDER — LOVASTATIN 40 MG PO TABS: 40.0000 mg | ORAL_TABLET | Freq: Every day | ORAL | Status: DC

## 2013-07-24 MED ORDER — FLUTICASONE PROPIONATE 50 MCG/ACT NA SUSP: 2.0000 | Freq: Every day | NASAL | Status: DC

## 2013-07-24 MED ORDER — NIFEDIPINE ER OSMOTIC RELEASE 60 MG PO TB24: 60.0000 mg | ORAL_TABLET | Freq: Every day | ORAL | Status: DC

## 2013-07-24 MED ORDER — LABETALOL HCL 100 MG PO TABS: 100.0000 mg | ORAL_TABLET | Freq: Two times a day (BID) | ORAL | Status: DC

## 2013-07-24 MED ORDER — FUROSEMIDE 20 MG PO TABS: 20.0000 mg | ORAL_TABLET | Freq: Every day | ORAL | Status: DC

## 2013-07-24 MED ORDER — ENALAPRIL MALEATE 10 MG PO TABS: 10.0000 mg | ORAL_TABLET | Freq: Every day | ORAL | Status: DC

## 2013-07-24 NOTE — Telephone Encounter (Signed)
Ok to send per [facility] via my-chart.     KP

## 2013-07-30 DIAGNOSIS — H3560 Retinal hemorrhage, unspecified eye: Secondary | ICD-10-CM | POA: Diagnosis not present

## 2013-09-04 ENCOUNTER — Ambulatory Visit: Payer: Medicare Other | Admitting: Family Medicine

## 2013-09-04 DIAGNOSIS — L738 Other specified follicular disorders: Secondary | ICD-10-CM | POA: Diagnosis not present

## 2013-09-04 DIAGNOSIS — L821 Other seborrheic keratosis: Secondary | ICD-10-CM | POA: Diagnosis not present

## 2013-09-04 DIAGNOSIS — L570 Actinic keratosis: Secondary | ICD-10-CM | POA: Diagnosis not present

## 2013-09-04 DIAGNOSIS — L408 Other psoriasis: Secondary | ICD-10-CM | POA: Diagnosis not present

## 2013-09-05 ENCOUNTER — Ambulatory Visit (INDEPENDENT_AMBULATORY_CARE_PROVIDER_SITE_OTHER): Payer: Medicare Other | Admitting: Family Medicine

## 2013-09-05 ENCOUNTER — Encounter: Payer: Self-pay | Admitting: Family Medicine

## 2013-09-05 VITALS — BP 126/76 | HR 64 | Temp 98.4°F | Ht 68.5 in | Wt 199.2 lb

## 2013-09-05 DIAGNOSIS — R7989 Other specified abnormal findings of blood chemistry: Secondary | ICD-10-CM

## 2013-09-05 DIAGNOSIS — M79672 Pain in left foot: Secondary | ICD-10-CM

## 2013-09-05 DIAGNOSIS — E1159 Type 2 diabetes mellitus with other circulatory complications: Secondary | ICD-10-CM

## 2013-09-05 DIAGNOSIS — M79609 Pain in unspecified limb: Secondary | ICD-10-CM

## 2013-09-05 DIAGNOSIS — K219 Gastro-esophageal reflux disease without esophagitis: Secondary | ICD-10-CM

## 2013-09-05 DIAGNOSIS — E291 Testicular hypofunction: Secondary | ICD-10-CM

## 2013-09-05 DIAGNOSIS — N400 Enlarged prostate without lower urinary tract symptoms: Secondary | ICD-10-CM

## 2013-09-05 DIAGNOSIS — I10 Essential (primary) hypertension: Secondary | ICD-10-CM

## 2013-09-05 DIAGNOSIS — E785 Hyperlipidemia, unspecified: Secondary | ICD-10-CM

## 2013-09-05 LAB — BASIC METABOLIC PANEL
BUN: 16 mg/dL (ref 6–23)
CHLORIDE: 102 meq/L (ref 96–112)
CO2: 27 mEq/L (ref 19–32)
CREATININE: 1.06 mg/dL (ref 0.50–1.35)
Calcium: 8.9 mg/dL (ref 8.4–10.5)
GLUCOSE: 89 mg/dL (ref 70–99)
POTASSIUM: 3.8 meq/L (ref 3.5–5.3)
Sodium: 139 mEq/L (ref 135–145)

## 2013-09-05 LAB — HEPATIC FUNCTION PANEL
ALK PHOS: 77 U/L (ref 39–117)
ALT: 22 U/L (ref 0–53)
AST: 26 U/L (ref 0–37)
Albumin: 4.4 g/dL (ref 3.5–5.2)
BILIRUBIN DIRECT: 0.1 mg/dL (ref 0.0–0.3)
BILIRUBIN INDIRECT: 0.3 mg/dL (ref 0.2–1.2)
TOTAL PROTEIN: 7.4 g/dL (ref 6.0–8.3)
Total Bilirubin: 0.4 mg/dL (ref 0.2–1.2)

## 2013-09-05 LAB — CBC WITH DIFFERENTIAL/PLATELET
BASOS PCT: 0 % (ref 0–1)
Basophils Absolute: 0 10*3/uL (ref 0.0–0.1)
EOS PCT: 5 % (ref 0–5)
Eosinophils Absolute: 0.1 10*3/uL (ref 0.0–0.7)
HEMATOCRIT: 36.3 % — AB (ref 39.0–52.0)
HEMOGLOBIN: 12.9 g/dL — AB (ref 13.0–17.0)
Lymphocytes Relative: 38 % (ref 12–46)
Lymphs Abs: 1.1 10*3/uL (ref 0.7–4.0)
MCH: 31.7 pg (ref 26.0–34.0)
MCHC: 35.5 g/dL (ref 30.0–36.0)
MCV: 89.2 fL (ref 78.0–100.0)
MONO ABS: 0.2 10*3/uL (ref 0.1–1.0)
MONOS PCT: 6 % (ref 3–12)
NEUTROS ABS: 1.5 10*3/uL — AB (ref 1.7–7.7)
Neutrophils Relative %: 51 % (ref 43–77)
Platelets: 198 10*3/uL (ref 150–400)
RBC: 4.07 MIL/uL — ABNORMAL LOW (ref 4.22–5.81)
RDW: 14.2 % (ref 11.5–15.5)
WBC: 2.9 10*3/uL — ABNORMAL LOW (ref 4.0–10.5)

## 2013-09-05 LAB — LIPID PANEL
CHOLESTEROL: 123 mg/dL (ref 0–200)
HDL: 31 mg/dL — ABNORMAL LOW (ref >39)
LDL Cholesterol: 62 mg/dL (ref 0–99)
Total CHOL/HDL Ratio: 4 Ratio
Triglycerides: 151 mg/dL — ABNORMAL HIGH (ref <150)
VLDL: 30 mg/dL (ref 0–40)

## 2013-09-05 LAB — HEMOGLOBIN A1C
Hgb A1c MFr Bld: 6.4 % — ABNORMAL HIGH (ref <5.7)
Mean Plasma Glucose: 137 mg/dL — ABNORMAL HIGH (ref <117)

## 2013-09-05 MED ORDER — NONFORMULARY OR COMPOUNDED ITEM: Status: DC

## 2013-09-05 MED ORDER — LOVASTATIN 40 MG PO TABS: 40.0000 mg | ORAL_TABLET | Freq: Every day | ORAL | Status: DC

## 2013-09-05 MED ORDER — ESOMEPRAZOLE MAGNESIUM 40 MG PO CPDR
40.0000 mg | DELAYED_RELEASE_CAPSULE | Freq: Every day | ORAL | Status: DC
Start: 2013-09-05 — End: 2013-10-31

## 2013-09-05 MED ORDER — LABETALOL HCL 100 MG PO TABS: 100.0000 mg | ORAL_TABLET | Freq: Two times a day (BID) | ORAL | Status: DC

## 2013-09-05 MED ORDER — NIFEDIPINE ER OSMOTIC RELEASE 60 MG PO TB24: 60.0000 mg | ORAL_TABLET | Freq: Every day | ORAL | Status: DC

## 2013-09-05 MED ORDER — ENALAPRIL MALEATE 10 MG PO TABS: 10.0000 mg | ORAL_TABLET | Freq: Every day | ORAL | Status: DC

## 2013-09-05 MED ORDER — FUROSEMIDE 20 MG PO TABS: ORAL_TABLET | ORAL | Status: DC

## 2013-09-05 NOTE — Patient Instructions (Signed)

## 2013-09-05 NOTE — Progress Notes (Signed)
Pre visit review using our clinic review tool, if applicable. No additional management support is needed unless otherwise documented below in the visit note. 

## 2013-09-06 LAB — PSA: PSA: 0.45 ng/mL (ref <=4.00)

## 2013-09-06 NOTE — Progress Notes (Signed)
Subjective:    Patient ID: Trevor Cooke, male    DOB: [birth year], 73 y.o.   MRN: [id number]  HPI.  HPI HYPERTENSION  Blood pressure range-not checking  Chest pain- no      Dyspnea- no Lightheadedness- no   Edema- no Other side effects - no   Medication compliance: good Low salt diet- yes  DIABETES  Blood Sugar ranges-not checking  Polyuria- no New Visual problems- no Hypoglycemic symptoms- no Other side effects-no Medication compliance - good Last eye exam- due Foot exam- today  HYPERLIPIDEMIA  Medication compliance- good RUQ pain- no  Muscle aches- no Other side effects-no  Review of Systems  Constitutional: Negative for diaphoresis, appetite change, fatigue and unexpected weight change.  Eyes: Negative for pain, redness and visual disturbance.  Respiratory: Negative for cough, chest tightness, shortness of breath and wheezing.   Cardiovascular: Negative for chest pain, palpitations and leg swelling.  Endocrine: Negative for cold intolerance, heat intolerance, polydipsia, polyphagia and polyuria.  Genitourinary: Negative for dysuria, frequency and difficulty urinating.  Neurological: Negative for dizziness, light-headedness, numbness and headaches.   Past Medical History  Diagnosis Date  . CVA (cerebral infarction) 1996  . Hypertension   . Psoriasis   . Hyperlipidemia    History   Social History  . Marital Status: Married    Spouse Name: N/A    Number of Children: N/A  . Years of Education: N/A   Occupational History  . Not on file.   Social History Main Topics  . Smoking status: Former Research scientist (life sciences)  . Smokeless tobacco: Never Used  . Alcohol Use: Yes     Comment: occasional wine  . Drug Use: No  . Sexual Activity: Not on file   Other Topics Concern  . Not on file   Social History Narrative  . No narrative on file   Family History  Problem Relation Age of Onset  . Colon cancer Neg Hx   . Hypertension Mother   . Hypertension Father     Current Outpatient Prescriptions  Medication Sig Dispense Refill  . aspirin 81 MG tablet Take 81 mg by mouth daily.      . calcium carbonate (OS-CAL) 600 MG TABS Take 600 mg by mouth 2 (two) times daily with a meal.      . clobetasol ointment (TEMOVATE) 0.05 % Apply topically 2 (two) times daily.      Marland Kitchen desonide (DESOWEN) 0.05 % ointment Apply topically 2 (two) times daily.      . enalapril (VASOTEC) 10 MG tablet Take 1 tablet (10 mg total) by mouth daily.  90 tablet  1  . fluocinonide (LIDEX) 0.05 % external solution Apply topically daily. Apply to scalp to treat affected areas      . fluticasone (FLONASE) 50 MCG/ACT nasal spray Place 2 sprays into both nostrils daily.  48 g  0  . furosemide (LASIX) 20 MG tablet 1 po qd  90 tablet  1  . labetalol (NORMODYNE) 100 MG tablet Take 1 tablet (100 mg total) by mouth 2 (two) times daily.  180 tablet  1  . lovastatin (MEVACOR) 40 MG tablet Take 1 tablet (40 mg total) by mouth at bedtime.  90 tablet  1  . multivitamin (THERAGRAN) per tablet Take 1 tablet by mouth daily.      Marland Kitchen NIFEdipine (PROCARDIA XL/ADALAT-CC) 60 MG 24 hr tablet Take 1 tablet (60 mg total) by mouth daily.  90 tablet  01  . UNABLE TO FIND Testosterone 50  mg/ml Lipo  90 g  5  . Vitamin Mixture (ESTER-C PO) Take 500 mg by mouth daily.      Marland Kitchen esomeprazole (NEXIUM) 40 MG capsule Take 1 capsule (40 mg total) by mouth daily.  90 capsule  3  . NONFORMULARY OR COMPOUNDED ITEM Testosterone 50 mg /ml lipo apply 1 ml ( 4 clicks) once a day 30 gm  # 3 months  1 each  1   No current facility-administered medications for this visit.       Objective:   Physical Exam BP 126/76  Pulse 64  Temp(Src) 98.4 F (36.9 C) (Oral)  Ht 5' 8.5" (1.74 m)  Wt 199 lb 3.2 oz (90.357 kg)  BMI 29.84 kg/m2  SpO2 97% General appearance: alert, cooperative, appears stated age and no distress Head: Normocephalic, without obvious abnormality, atraumatic Throat: lips, mucosa, and tongue normal; teeth and  gums normal Neck: no adenopathy, supple, symmetrical, trachea midline and thyroid not enlarged, symmetric, no tenderness/mass/nodules Lungs: clear to auscultation bilaterally Heart: S1, S2 normal Extremities: extremities normal, atraumatic, no cyanosis or edema        Assessment & Plan:  1. Left foot pain ?spur vs plantar fascitis - Ambulatory referral to Podiatry  2. GERD (gastroesophageal reflux disease) Omeprazole not working --- - esomeprazole (NEXIUM) 40 MG capsule; Take 1 capsule (40 mg total) by mouth daily.  Dispense: 90 capsule; Refill: 3 - CBC with Differential  3. HTN (hypertension) Stable, con't meds - enalapril (VASOTEC) 10 MG tablet; Take 1 tablet (10 mg total) by mouth daily.  Dispense: 90 tablet; Refill: 1 - furosemide (LASIX) 20 MG tablet; 1 po qd  Dispense: 90 tablet; Refill: 1 - labetalol (NORMODYNE) 100 MG tablet; Take 1 tablet (100 mg total) by mouth 2 (two) times daily.  Dispense: 180 tablet; Refill: 1 - NIFEdipine (PROCARDIA XL/ADALAT-CC) 60 MG 24 hr tablet; Take 1 tablet (60 mg total) by mouth daily.  Dispense: 90 tablet; Refill: 01 - Basic Metabolic Panel (BMET)  4. Other and unspecified hyperlipidemia Check labs - lovastatin (MEVACOR) 40 MG tablet; Take 1 tablet (40 mg total) by mouth at bedtime.  Dispense: 90 tablet; Refill: 1 - Lipid panel - Hepatic function panel  5. Low testosterone Check labs - NONFORMULARY OR COMPOUNDED ITEM; Testosterone 50 mg /ml lipo apply 1 ml ( 4 clicks) once a day 30 gm  # 3 months  Dispense: 1 each; Refill: 1 - Testosterone, free, total  6. Type II or unspecified type diabetes mellitus with peripheral circulatory disorders, uncontrolled(250.72) Check labs,   Advised occassional glucose checks - POCT urinalysis dipstick - HgB A1c  7. Benign prostate hyperplasia  - PSA

## 2013-09-08 LAB — TESTOSTERONE, FREE, TOTAL, SHBG
SEX HORMONE BINDING: 22 nmol/L (ref 13–71)
TESTOSTERONE-% FREE: 2.4 % (ref 1.6–2.9)
Testosterone, Free: 46.4 pg/mL — ABNORMAL LOW (ref 47.0–244.0)
Testosterone: 197 ng/dL — ABNORMAL LOW (ref 300–890)

## 2013-09-10 LAB — POCT URINALYSIS DIPSTICK
BILIRUBIN UA: NEGATIVE
Glucose, UA: NEGATIVE
Ketones, UA: NEGATIVE
Leukocytes, UA: NEGATIVE
NITRITE UA: NEGATIVE
PH UA: 6
Protein, UA: NEGATIVE
RBC UA: NEGATIVE
SPEC GRAV UA: 1.015
Urobilinogen, UA: 0.2

## 2013-09-18 ENCOUNTER — Encounter: Payer: Self-pay | Admitting: Podiatry

## 2013-09-18 ENCOUNTER — Ambulatory Visit (INDEPENDENT_AMBULATORY_CARE_PROVIDER_SITE_OTHER): Payer: Medicare Other | Admitting: Podiatry

## 2013-09-18 ENCOUNTER — Ambulatory Visit (INDEPENDENT_AMBULATORY_CARE_PROVIDER_SITE_OTHER): Payer: Medicare Other

## 2013-09-18 VITALS — BP 149/84 | HR 52 | Resp 16 | Ht 69.0 in | Wt 193.0 lb

## 2013-09-18 DIAGNOSIS — M722 Plantar fascial fibromatosis: Secondary | ICD-10-CM

## 2013-09-18 MED ORDER — TRIAMCINOLONE ACETONIDE 10 MG/ML IJ SUSP
10.0000 mg | Freq: Once | INTRAMUSCULAR | Status: AC
  Administered 2013-09-18: 10 mg

## 2013-09-18 NOTE — Patient Instructions (Signed)

## 2013-09-18 NOTE — Progress Notes (Signed)
   Subjective:    Patient ID: Trevor Cooke, male    DOB: Oct 16, 1940, 73 y.o.   MRN: [id number]  HPI Comments: "I have pain in this heel"  Patient c/o aching plantar heel left for about 6 months. He has AM pain. Walking aggravates. Little better today since he's been off feet due to having a cold. He has tried rest and heating pad.  Foot Pain Associated symptoms include arthralgias.      Review of Systems  Musculoskeletal: Positive for arthralgias.  All other systems reviewed and are negative.      Objective:   Physical Exam        Assessment & Plan:

## 2013-09-18 NOTE — Progress Notes (Signed)
Subjective:     Patient ID: Trevor Cooke, male   DOB: 27-Jan-1941, 73 y.o.   MRN: [id number]  Foot Pain   patient states my heel on my left foot has been hurting me for several months and making it difficult to walk or do any type of real activity   Review of Systems  All other systems reviewed and are negative.      Objective:   Physical Exam  Nursing note and vitals reviewed. Constitutional: He is oriented to person, place, and time.  Cardiovascular: Intact distal pulses.   Musculoskeletal: Normal range of motion.  Neurological: He is oriented to person, place, and time.  Skin: Skin is warm.   neurovascular status intact with muscle strength adequate and range of motion subtalar midtarsal joint mildly diminished. Patient's digits are well-perfused and patient is noted to have adequate arch height upon weightbearing. Patient's left heel is very tender when pressed at the insertion of the fascia into the calcaneus     Assessment:     Acute plan her fasciitis left with inflammation and fluid buildup    Plan:     H&P and x-ray reviewed. Injected the left plantar fascia 3 mg Kenalog 5 mg like Marcaine mixture and applied fascially brace with instructions on usage. Reappoint one week

## 2013-09-25 ENCOUNTER — Ambulatory Visit (INDEPENDENT_AMBULATORY_CARE_PROVIDER_SITE_OTHER): Payer: Medicare Other | Admitting: Podiatry

## 2013-09-25 ENCOUNTER — Encounter: Payer: Self-pay | Admitting: Podiatry

## 2013-09-25 VITALS — BP 131/77 | HR 69 | Resp 16

## 2013-09-25 DIAGNOSIS — M722 Plantar fascial fibromatosis: Secondary | ICD-10-CM | POA: Diagnosis not present

## 2013-09-25 MED ORDER — TRIAMCINOLONE ACETONIDE 10 MG/ML IJ SUSP
10.0000 mg | Freq: Once | INTRAMUSCULAR | Status: AC
  Administered 2013-09-25: 10 mg

## 2013-09-27 NOTE — Progress Notes (Signed)
Subjective:     Patient ID: Trevor Cooke, male   DOB: [birth year], 73 y.o.   MRN: [id number]  HPI patient states that my heel is improving but still sore in one spot   Review of Systems     Objective:   Physical Exam Neurovascular status intact with muscle strength adequate and discomfort still noted in the plantar heel at the insertion of the tendon into the calcaneus    Assessment:     Plantar  fasciitis left still present but improved    Plan:     Reinjected the plantar fascia 3 mg Kenalog 5 mg Xylocaine Marcaine mixture and gave instructions on physical therapy supportive shoe and not going barefoot. Reappoint if symptoms persist

## 2013-09-29 ENCOUNTER — Telehealth: Payer: Self-pay | Admitting: Family Medicine

## 2013-09-29 DIAGNOSIS — E785 Hyperlipidemia, unspecified: Secondary | ICD-10-CM

## 2013-09-29 MED ORDER — LOVASTATIN 40 MG PO TABS: 40.0000 mg | ORAL_TABLET | Freq: Every day | ORAL | Status: DC

## 2013-09-29 NOTE — Telephone Encounter (Signed)
Patient aware Rx will be ready for pick up this afternoon.      KP

## 2013-09-29 NOTE — Telephone Encounter (Signed)
Caller name: [place] Relation to pt: Call back [place]: [facility]  Reason for call:   Pt is wanting to get the RX lovastatin (MEVACOR) 40 MG.  Pt states that he needs the hard copy to send in with his other medications at the same time.

## 2013-10-08 DIAGNOSIS — H01009 Unspecified blepharitis unspecified eye, unspecified eyelid: Secondary | ICD-10-CM | POA: Diagnosis not present

## 2013-10-08 DIAGNOSIS — H2510 Age-related nuclear cataract, unspecified eye: Secondary | ICD-10-CM | POA: Diagnosis not present

## 2013-10-08 DIAGNOSIS — H3560 Retinal hemorrhage, unspecified eye: Secondary | ICD-10-CM | POA: Diagnosis not present

## 2013-10-31 ENCOUNTER — Encounter: Payer: Self-pay | Admitting: Family Medicine

## 2013-10-31 ENCOUNTER — Ambulatory Visit (INDEPENDENT_AMBULATORY_CARE_PROVIDER_SITE_OTHER): Payer: Medicare Other | Admitting: Family Medicine

## 2013-10-31 VITALS — BP 132/72 | HR 57 | Temp 97.8°F | Wt 193.6 lb

## 2013-10-31 DIAGNOSIS — Z0000 Encounter for general adult medical examination without abnormal findings: Secondary | ICD-10-CM | POA: Diagnosis not present

## 2013-10-31 DIAGNOSIS — E785 Hyperlipidemia, unspecified: Secondary | ICD-10-CM | POA: Diagnosis not present

## 2013-10-31 LAB — CBC WITH DIFFERENTIAL/PLATELET
Basophils Absolute: 0 10*3/uL (ref 0.0–0.1)
Basophils Relative: 0.4 % (ref 0.0–3.0)
EOS PCT: 2.2 % (ref 0.0–5.0)
Eosinophils Absolute: 0.1 10*3/uL (ref 0.0–0.7)
HCT: 39.2 % (ref 39.0–52.0)
Hemoglobin: 13.2 g/dL (ref 13.0–17.0)
Lymphocytes Relative: 38.2 % (ref 12.0–46.0)
Lymphs Abs: 1 10*3/uL (ref 0.7–4.0)
MCHC: 33.8 g/dL (ref 30.0–36.0)
MCV: 95.1 fl (ref 78.0–100.0)
Monocytes Absolute: 0.1 10*3/uL (ref 0.1–1.0)
Monocytes Relative: 4.7 % (ref 3.0–12.0)
NEUTROS PCT: 54.5 % (ref 43.0–77.0)
Neutro Abs: 1.5 10*3/uL (ref 1.4–7.7)
Platelets: 187 10*3/uL (ref 150.0–400.0)
RBC: 4.12 Mil/uL — ABNORMAL LOW (ref 4.22–5.81)
RDW: 13.5 % (ref 11.5–15.5)
WBC: 2.7 10*3/uL — AB (ref 4.0–10.5)

## 2013-10-31 LAB — HEPATIC FUNCTION PANEL
ALK PHOS: 76 U/L (ref 39–117)
ALT: 22 U/L (ref 0–53)
AST: 30 U/L (ref 0–37)
Albumin: 4.1 g/dL (ref 3.5–5.2)
BILIRUBIN DIRECT: 0.1 mg/dL (ref 0.0–0.3)
TOTAL PROTEIN: 7.6 g/dL (ref 6.0–8.3)
Total Bilirubin: 0.6 mg/dL (ref 0.2–1.2)

## 2013-10-31 LAB — LIPID PANEL
Cholesterol: 109 mg/dL (ref 0–200)
HDL: 31.1 mg/dL — ABNORMAL LOW (ref >39.00)
LDL Cholesterol: 55 mg/dL (ref 0–99)
NonHDL: 77.9
Total CHOL/HDL Ratio: 4
Triglycerides: 115 mg/dL (ref 0.0–149.0)
VLDL: 23 mg/dL (ref 0.0–40.0)

## 2013-10-31 NOTE — Progress Notes (Signed)
Subjective:    Trevor Cooke is a 73 y.o. male who presents for Medicare Annual/Subsequent preventive examination.   Preventive Screening-Counseling & Management  Tobacco History  Smoking status  . Former Smoker  Smokeless tobacco  . Never Used    Problems Prior to Visit 1. none  Current Problems (verified) Patient Active Problem List   Diagnosis Date Noted  . Arthritis 08/01/2011  . Routine health maintenance 08/01/2011  . PSORIASIS 05/31/2010  . CHEST WALL PAIN, ACUTE 04/12/2010  . Hypogonadism male 02/12/2007  . HYPOTHYROIDISM 10/06/2006  . DIABETES MELLITUS, TYPE II 10/06/2006  . HYPERLIPIDEMIA 10/06/2006  . ANEMIA-NOS 10/06/2006  . HYPERTENSION 10/06/2006  . GERD 10/06/2006    Medications Prior to Visit Current Outpatient Prescriptions on File Prior to Visit  Medication Sig Dispense Refill  . aspirin 81 MG tablet Take 81 mg by mouth daily.      . calcium carbonate (OS-CAL) 600 MG TABS Take 600 mg by mouth 2 (two) times daily with a meal.      . clobetasol ointment (TEMOVATE) 0.05 % Apply topically 2 (two) times daily.      Marland Kitchen desonide (DESOWEN) 0.05 % ointment Apply topically 2 (two) times daily.      . enalapril (VASOTEC) 10 MG tablet Take 1 tablet (10 mg total) by mouth daily.  90 tablet  1  . fluocinonide (LIDEX) 0.05 % external solution Apply topically daily. Apply to scalp to treat affected areas      . fluticasone (FLONASE) 50 MCG/ACT nasal spray Place 2 sprays into both nostrils daily.  48 g  0  . furosemide (LASIX) 20 MG tablet 1 po qd  90 tablet  1  . labetalol (NORMODYNE) 100 MG tablet Take 1 tablet (100 mg total) by mouth 2 (two) times daily.  180 tablet  1  . lovastatin (MEVACOR) 40 MG tablet Take 1 tablet (40 mg total) by mouth at bedtime.  90 tablet  1  . multivitamin (THERAGRAN) per tablet Take 1 tablet by mouth daily.      Marland Kitchen NIFEdipine (PROCARDIA XL/ADALAT-CC) 60 MG 24 hr tablet Take 1 tablet (60 mg total) by mouth daily.  90 tablet  01  .  NONFORMULARY OR COMPOUNDED ITEM Testosterone 50 mg /ml lipo apply 1 ml ( 4 clicks) once a day 30 gm  # 3 months  1 each  1  . Vitamin Mixture (ESTER-C PO) Take 500 mg by mouth daily.       No current facility-administered medications on file prior to visit.    Current Medications (verified) Current Outpatient Prescriptions  Medication Sig Dispense Refill  . aspirin 81 MG tablet Take 81 mg by mouth daily.      . calcium carbonate (OS-CAL) 600 MG TABS Take 600 mg by mouth 2 (two) times daily with a meal.      . clobetasol ointment (TEMOVATE) 0.05 % Apply topically 2 (two) times daily.      Marland Kitchen desonide (DESOWEN) 0.05 % ointment Apply topically 2 (two) times daily.      . enalapril (VASOTEC) 10 MG tablet Take 1 tablet (10 mg total) by mouth daily.  90 tablet  1  . fluocinonide (LIDEX) 0.05 % external solution Apply topically daily. Apply to scalp to treat affected areas      . fluticasone (FLONASE) 50 MCG/ACT nasal spray Place 2 sprays into both nostrils daily.  48 g  0  . furosemide (LASIX) 20 MG tablet 1 po qd  90 tablet  1  .  labetalol (NORMODYNE) 100 MG tablet Take 1 tablet (100 mg total) by mouth 2 (two) times daily.  180 tablet  1  . lovastatin (MEVACOR) 40 MG tablet Take 1 tablet (40 mg total) by mouth at bedtime.  90 tablet  1  . multivitamin (THERAGRAN) per tablet Take 1 tablet by mouth daily.      Marland Kitchen NIFEdipine (PROCARDIA XL/ADALAT-CC) 60 MG 24 hr tablet Take 1 tablet (60 mg total) by mouth daily.  90 tablet  01  . NONFORMULARY OR COMPOUNDED ITEM Testosterone 50 mg /ml lipo apply 1 ml ( 4 clicks) once a day 30 gm  # 3 months  1 each  1  . omeprazole (PRILOSEC OTC) 20 MG tablet Take 20 mg by mouth daily.      . Vitamin Mixture (ESTER-C PO) Take 500 mg by mouth daily.       No current facility-administered medications for this visit.     Allergies (verified) Review of patient's allergies indicates no known allergies.   PAST HISTORY  Family History Family History  Problem Relation  Age of Onset  . Colon cancer Neg Hx   . Hypertension Mother   . Hypertension Father     Social History History  Substance Use Topics  . Smoking status: Former Research scientist (life sciences)  . Smokeless tobacco: Never Used  . Alcohol Use: Yes     Comment: occasional wine    Are there smokers in your home (other than you)?  No  Risk Factors Current exercise habits: walking  Dietary issues discussed: na   Cardiac risk factors: advanced age (older than 9 for men, 55 for women), diabetes mellitus, dyslipidemia, hypertension and male gender.  Depression Screen (Note: if answer to either of the following is "Yes", a more complete depression screening is indicated)   Q1: Over the past two weeks, have you felt down, depressed or hopeless? No  Q2: Over the past two weeks, have you felt little interest or pleasure in doing things? No  Have you lost interest or pleasure in daily life? No  Do you often feel hopeless? No  Do you cry easily over simple problems? No  Activities of Daily Living In your present state of health, do you have any difficulty performing the following activities?:  Driving? No Managing money?  No Feeding yourself? No Getting from bed to chair? No Climbing a flight of stairs? No Preparing food and eating?: No Bathing or showering? No Getting dressed: No Getting to the toilet? No Using the toilet:No Moving around from place to place: No In the past year have you fallen or had a near fall?:No   Are you sexually active?  Yes  Do you have more than one partner?  No  Hearing Difficulties: Yes--- hearing aids Do you often ask people to speak up or repeat themselves? Yes Do you experience ringing or noises in your ears? No Do you have difficulty understanding soft or whispered voices? Yes   Do you feel that you have a problem with memory? No  Do you often misplace items? No  Do you feel safe at home?  No  Cognitive Testing  Alert? Yes  Normal Appearance?Yes  Oriented to  person? Yes  Place? Yes   Time? Yes  Recall of three objects?  Yes  Can perform simple calculations? Yes  Displays appropriate judgment?Yes  Can read the correct time from a watch face?Yes   Advanced Directives have been discussed with the patient? Yes   List the Names of  Other Physician/Practitioners you currently use: 1.  Derm- [facility] 2.  opth--[facility] 3. Dentist--[facility] 4. Podiatry-- [place]   Indicate any recent Medical Services you may have received from other than [facility] providers in the past year (date may be approximate).  Immunization History  Administered Date(s) Administered  . H1N1 04/21/2008  . Influenza Split 01/30/2012  . Influenza Whole 12/28/2003, 01/02/2008, 12/29/2009  . Influenza, High Dose Seasonal PF 01/27/2013  . Pneumococcal Conjugate-13 04/07/2013  . Pneumococcal Polysaccharide-23 04/12/2010  . Td 09/25/1995  . Tdap 10/21/2010  . Zoster 01/30/2012    Screening Tests Health Maintenance  Topic Date Due  . Urine Microalbumin  03/07/1949  . Influenza Vaccine  12/31/2013 (Originally 10/25/2013)  . Hemoglobin A1c  03/07/2014  . Ophthalmology Exam  07/31/2014  . Foot Exam  09/19/2014  . Tetanus/tdap  10/20/2020  . Colonoscopy  03/07/2023  . Pneumococcal Polysaccharide Vaccine Age 26 And Over  Completed  . Zostavax  Completed    All answers were reviewed with the patient and necessary referrals were made:  Garnet Koyanagi, DO   10/31/2013   History reviewed:  He  has a past medical history of CVA (cerebral infarction) (1996); Hypertension; Psoriasis; and Hyperlipidemia. He  does not have any pertinent problems on file. He  has past surgical history that includes Cleft palate repair (1940's). His family history includes Hypertension in his father and mother. There is no history of Colon cancer. He  reports that he has quit smoking. He has never used smokeless tobacco. He reports that he drinks alcohol. He reports that he does not use illicit drugs. He  has a current medication list which includes the following prescription(s): aspirin, calcium carbonate, clobetasol ointment, desonide, enalapril, fluocinonide, fluticasone, furosemide, labetalol, lovastatin, multivitamin, nifedipine, NONFORMULARY OR COMPOUNDED ITEM, omeprazole, and vitamin mixture. Current Outpatient Prescriptions on File Prior to Visit  Medication Sig Dispense Refill  . aspirin 81 MG tablet Take 81 mg by mouth daily.      . calcium carbonate (OS-CAL) 600 MG TABS Take 600 mg by mouth 2 (two) times daily with a meal.      . clobetasol ointment (TEMOVATE) 0.05 % Apply topically 2 (two) times daily.      Marland Kitchen desonide (DESOWEN) 0.05 % ointment Apply topically 2 (two) times daily.      . enalapril (VASOTEC) 10 MG tablet Take 1 tablet (10 mg total) by mouth daily.  90 tablet  1  . fluocinonide (LIDEX) 0.05 % external solution Apply topically daily. Apply to scalp to treat affected areas      . fluticasone (FLONASE) 50 MCG/ACT nasal spray Place 2 sprays into both nostrils daily.  48 g  0  . furosemide (LASIX) 20 MG tablet 1 po qd  90 tablet  1  . labetalol (NORMODYNE) 100 MG tablet Take 1 tablet (100 mg total) by mouth 2 (two) times daily.  180 tablet  1  . lovastatin (MEVACOR) 40 MG tablet Take 1 tablet (40 mg total) by mouth at bedtime.  90 tablet  1  . multivitamin (THERAGRAN) per tablet Take 1 tablet by mouth daily.      Marland Kitchen NIFEdipine (PROCARDIA XL/ADALAT-CC) 60 MG 24 hr tablet Take 1 tablet (60 mg total) by mouth daily.  90 tablet  01  . NONFORMULARY OR COMPOUNDED ITEM Testosterone 50 mg /ml lipo apply 1 ml ( 4 clicks) once a day 30 gm  # 3 months  1 each  1  . Vitamin Mixture (ESTER-C PO) Take 500 mg by  mouth daily.       No current facility-administered medications on file prior to visit.   He has No Known Allergies.  Review of Systems  Review of Systems  Constitutional: Negative for activity change, appetite change and fatigue.  HENT: Negative for hearing loss, congestion,  tinnitus and ear discharge.   Eyes: Negative for visual disturbance (see optho q1y -- vision corrected to 20/20 with glasses).  Respiratory: Negative for cough, chest tightness and shortness of breath.   Cardiovascular: Negative for chest pain, palpitations and leg swelling.  Gastrointestinal: Negative for abdominal pain, diarrhea, constipation and abdominal distention.  Genitourinary: Negative for urgency, frequency, decreased urine volume and difficulty urinating.  Musculoskeletal: Negative for back pain, arthralgias and gait problem.  Skin: Negative for color change, pallor and rash.  Neurological: Negative for dizziness, light-headedness, numbness and headaches.  Hematological: Negative for adenopathy. Does not bruise/bleed easily.  Psychiatric/Behavioral: Negative for suicidal ideas, confusion, sleep disturbance, self-injury, dysphoric mood, decreased concentration and agitation.  Pt is able to read and write and can do all ADLs No risk for falling No abuse/ violence in home     Objective:     Vision by Snellen chart: opth Blood pressure 132/72, pulse 57, temperature 97.8 F (36.6 C), temperature source Oral, weight 193 lb 9.6 oz (87.816 kg), SpO2 96.00%. Body mass index is 28.58 kg/(m^2).  BP 132/72  Pulse 57  Temp(Src) 97.8 F (36.6 C) (Oral)  Wt 193 lb 9.6 oz (87.816 kg)  SpO2 96% General appearance: alert, cooperative, appears stated age and no distress Head: Normocephalic, without obvious abnormality, atraumatic Eyes: conjunctivae/corneas clear. PERRL, EOM's intact. Fundi benign. Ears: normal TM's and external ear canals both ears Nose: Nares normal. Septum midline. Mucosa normal. No drainage or sinus tenderness. Throat: lips, mucosa, and tongue normal; teeth and gums normal Neck: no adenopathy, no carotid bruit, no JVD, supple, symmetrical, trachea midline and thyroid not enlarged, symmetric, no tenderness/mass/nodules Back: symmetric, no curvature. ROM normal. No CVA  tenderness. Lungs: clear to auscultation bilaterally Chest wall: no tenderness Heart: regular rate and rhythm, S1, S2 normal, no murmur, click, rub or gallop Abdomen: soft, non-tender; bowel sounds normal; no masses,  no organomegaly Male genitalia: normal Rectal: normal tone, normal prostate, no masses or tenderness and soft brown guaiac negative stool noted Extremities: extremities normal, atraumatic, no cyanosis or edema Pulses: 2+ and symmetric Skin: Skin color, texture, turgor normal. No rashes or lesions Lymph nodes: Cervical, supraclavicular, and axillary nodes normal. Neurologic: Alert and oriented X 3, normal strength and tone. Normal symmetric reflexes. Normal coordination and gait Psych-no depression, no anxiety      Assessment:     cpe      Plan:     During the course of the visit the patient was educated and counseled about appropriate screening and preventive services including:    Pneumococcal vaccine   Influenza vaccine  Hepatitis B vaccine  Td vaccine  Prostate cancer screening  Colorectal cancer screening  Diabetes screening  Glaucoma screening  Advanced directives: has an advanced directive - a copy HAS NOT been provided.  Diet review for nutrition referral? Yes ____  Not Indicated __x__   Patient Instructions (the written plan) was given to the patient.  Medicare Attestation I have personally reviewed: The patient's medical and social history Their use of alcohol, tobacco or illicit drugs Their current medications and supplements The patient's functional ability including ADLs,fall risks, home safety risks, cognitive, and hearing and visual impairment Diet and physical activities Evidence  for depression or mood disorders  The patient's weight, height, BMI, and visual acuity have been recorded in the chart.  I have made referrals, counseling, and provided education to the patient based on review of the above and I have provided the patient  with a written personalized care plan for preventive services.   1. Other and unspecified hyperlipidemia Check labs - CBC with Differential - Hepatic function panel - Lipid panel  2. Medicare annual wellness visit, subsequent    Garnet Koyanagi, DO   10/31/2013

## 2013-10-31 NOTE — Patient Instructions (Addendum)
Preventive Care for Adults A healthy lifestyle and preventive care can promote health and wellness. Preventive health guidelines for men include the following key practices:  A routine yearly physical is a good way to check with your health care provider about your health and preventative screening. It is a chance to share any concerns and updates on your health and to receive a thorough exam.  Visit your dentist for a routine exam and preventative care every 6 months. Brush your teeth twice a day and floss once a day. Good oral hygiene prevents tooth decay and gum disease.  The frequency of eye exams is based on your age, health, family medical history, use of contact lenses, and other factors. Follow your health care provider's recommendations for frequency of eye exams.  Eat a healthy diet. Foods such as vegetables, fruits, whole grains, low-fat dairy products, and lean protein foods contain the nutrients you need without too many calories. Decrease your intake of foods high in solid fats, added sugars, and salt. Eat the right amount of calories for you.Get information about a proper diet from your health care provider, if necessary.  Regular physical exercise is one of the most important things you can do for your health. Most adults should get at least 150 minutes of moderate-intensity exercise (any activity that increases your heart rate and causes you to sweat) each week. In addition, most adults need muscle-strengthening exercises on 2 or more days a week.  Maintain a healthy weight. The body mass index (BMI) is a screening tool to identify possible weight problems. It provides an estimate of body fat based on height and weight. Your health care provider can find your BMI and can help you achieve or maintain a healthy weight.For adults 20 years and older:  A BMI below 18.5 is considered underweight.  A BMI of 18.5 to 24.9 is normal.  A BMI of 25 to 29.9 is considered overweight.  A BMI  of 30 and above is considered obese.  Maintain normal blood lipids and cholesterol levels by exercising and minimizing your intake of saturated fat. Eat a balanced diet with plenty of fruit and vegetables. Blood tests for lipids and cholesterol should begin at age 50 and be repeated every 5 years. If your lipid or cholesterol levels are high, you are over 50, or you are at high risk for heart disease, you may need your cholesterol levels checked more frequently.Ongoing high lipid and cholesterol levels should be treated with medicines if diet and exercise are not working.  If you smoke, find out from your health care provider how to quit. If you do not use tobacco, do not start.  Lung cancer screening is recommended for adults aged 73-80 years who are at high risk for developing lung cancer because of a history of smoking. A yearly low-dose CT scan of the lungs is recommended for people who have at least a 30-pack-year history of smoking and are a current smoker or have quit within the past 15 years. A pack year of smoking is smoking an average of 1 pack of cigarettes a day for 1 year (for example: 1 pack a day for 30 years or 2 packs a day for 15 years). Yearly screening should continue until the smoker has stopped smoking for at least 15 years. Yearly screening should be stopped for people who develop a health problem that would prevent them from having lung cancer treatment.  If you choose to drink alcohol, do not have more than  2 drinks per day. One drink is considered to be 12 ounces (355 mL) of beer, 5 ounces (148 mL) of wine, or 1.5 ounces (44 mL) of liquor.  Avoid use of street drugs. Do not share needles with anyone. Ask for help if you need support or instructions about stopping the use of drugs.  High blood pressure causes heart disease and increases the risk of stroke. Your blood pressure should be checked at least every 1-2 years. Ongoing high blood pressure should be treated with  medicines, if weight loss and exercise are not effective.  If you are 45-79 years old, ask your health care provider if you should take aspirin to prevent heart disease.  Diabetes screening involves taking a blood sample to check your fasting blood sugar level. This should be done once every 3 years, after age 45, if you are within normal weight and without risk factors for diabetes. Testing should be considered at a younger age or be carried out more frequently if you are overweight and have at least 1 risk factor for diabetes.  Colorectal cancer can be detected and often prevented. Most routine colorectal cancer screening begins at the age of 50 and continues through age 75. However, your health care provider may recommend screening at an earlier age if you have risk factors for colon cancer. On a yearly basis, your health care provider may provide home test kits to check for hidden blood in the stool. Use of a small camera at the end of a tube to directly examine the colon (sigmoidoscopy or colonoscopy) can detect the earliest forms of colorectal cancer. Talk to your health care provider about this at age 50, when routine screening begins. Direct exam of the colon should be repeated every 5-10 years through age 75, unless early forms of precancerous polyps or small growths are found.  People who are at an increased risk for hepatitis B should be screened for this virus. You are considered at high risk for hepatitis B if:  You were born in a country where hepatitis B occurs often. Talk with your health care provider about which countries are considered high risk.  Your parents were born in a high-risk country and you have not received a shot to protect against hepatitis B (hepatitis B vaccine).  You have HIV or AIDS.  You use needles to inject street drugs.  You live with, or have sex with, someone who has hepatitis B.  You are a man who has sex with other men (MSM).  You get hemodialysis  treatment.  You take certain medicines for conditions such as cancer, organ transplantation, and autoimmune conditions.  Hepatitis C blood testing is recommended for all people born from 1945 through 1965 and any individual with known risks for hepatitis C.  Practice safe sex. Use condoms and avoid high-risk sexual practices to reduce the spread of sexually transmitted infections (STIs). STIs include gonorrhea, chlamydia, syphilis, trichomonas, herpes, HPV, and human immunodeficiency virus (HIV). Herpes, HIV, and HPV are viral illnesses that have no cure. They can result in disability, cancer, and death.  If you are at risk of being infected with HIV, it is recommended that you take a prescription medicine daily to prevent HIV infection. This is called preexposure prophylaxis (PrEP). You are considered at risk if:  You are a man who has sex with other men (MSM) and have other risk factors.  You are a heterosexual man, are sexually active, and are at increased risk for HIV infection.    You take drugs by injection.  You are sexually active with a partner who has HIV.  Talk with your health care provider about whether you are at high risk of being infected with HIV. If you choose to begin PrEP, you should first be tested for HIV. You should then be tested every 3 months for as long as you are taking PrEP.  A one-time screening for abdominal aortic aneurysm (AAA) and surgical repair of large AAAs by ultrasound are recommended for men ages 32 to 67 years who are current or former smokers.  Healthy men should no longer receive prostate-specific antigen (PSA) blood tests as part of routine cancer screening. Talk with your health care provider about prostate cancer screening.  Testicular cancer screening is not recommended for adult males who have no symptoms. Screening includes self-exam, a health care provider exam, and other screening tests. Consult with your health care provider about any symptoms  you have or any concerns you have about testicular cancer.  Use sunscreen. Apply sunscreen liberally and repeatedly throughout the day. You should seek shade when your shadow is shorter than you. Protect yourself by wearing long sleeves, pants, a wide-brimmed hat, and sunglasses year round, whenever you are outdoors.  Once a month, do a whole-body skin exam, using a mirror to look at the skin on your back. Tell your health care provider about new moles, moles that have irregular borders, moles that are larger than a pencil eraser, or moles that have changed in shape or color.  Stay current with required vaccines (immunizations).  Influenza vaccine. All adults should be immunized every year.  Tetanus, diphtheria, and acellular pertussis (Td, Tdap) vaccine. An adult who has not previously received Tdap or who does not know his vaccine status should receive 1 dose of Tdap. This initial dose should be followed by tetanus and diphtheria toxoids (Td) booster doses every 10 years. Adults with an unknown or incomplete history of completing a 3-dose immunization series with Td-containing vaccines should begin or complete a primary immunization series including a Tdap dose. Adults should receive a Td booster every 10 years.  Varicella vaccine. An adult without evidence of immunity to varicella should receive 2 doses or a second dose if he has previously received 1 dose.  Human papillomavirus (HPV) vaccine. Males aged 68-21 years who have not received the vaccine previously should receive the 3-dose series. Males aged 22-26 years may be immunized. Immunization is recommended through the age of 6 years for any male who has sex with males and did not get any or all doses earlier. Immunization is recommended for any person with an immunocompromised condition through the age of 49 years if he did not get any or all doses earlier. During the 3-dose series, the second dose should be obtained 4-8 weeks after the first  dose. The third dose should be obtained 24 weeks after the first dose and 16 weeks after the second dose.  Zoster vaccine. One dose is recommended for adults aged 50 years or older unless certain conditions are present.  Measles, mumps, and rubella (MMR) vaccine. Adults born before [birth year] generally are considered immune to measles and mumps. Adults born in [birth year] or later should have 1 or more doses of MMR vaccine unless there is a contraindication to the vaccine or there is laboratory evidence of immunity to each of the three diseases. A routine second dose of MMR vaccine should be obtained at least 28 days after the first dose for students attending postsecondary  schools, health care workers, or international travelers. People who received inactivated measles vaccine or an unknown type of measles vaccine during 1963-1967 should receive 2 doses of MMR vaccine. People who received inactivated mumps vaccine or an unknown type of mumps vaccine before 1979 and are at high risk for mumps infection should consider immunization with 2 doses of MMR vaccine. Unvaccinated health care workers born before 1957 who lack laboratory evidence of measles, mumps, or rubella immunity or laboratory confirmation of disease should consider measles and mumps immunization with 2 doses of MMR vaccine or rubella immunization with 1 dose of MMR vaccine.  Pneumococcal 13-valent conjugate (PCV13) vaccine. When indicated, a person who is uncertain of his immunization history and has no record of immunization should receive the PCV13 vaccine. An adult aged 19 years or older who has certain medical conditions and has not been previously immunized should receive 1 dose of PCV13 vaccine. This PCV13 should be followed with a dose of pneumococcal polysaccharide (PPSV23) vaccine. The PPSV23 vaccine dose should be obtained at least 8 weeks after the dose of PCV13 vaccine. An adult aged 19 years or older who has certain medical conditions and  previously received 1 or more doses of PPSV23 vaccine should receive 1 dose of PCV13. The PCV13 vaccine dose should be obtained 1 or more years after the last PPSV23 vaccine dose.  Pneumococcal polysaccharide (PPSV23) vaccine. When PCV13 is also indicated, PCV13 should be obtained first. All adults aged 65 years and older should be immunized. An adult younger than age 65 years who has certain medical conditions should be immunized. Any person who resides in a nursing home or long-term care facility should be immunized. An adult smoker should be immunized. People with an immunocompromised condition and certain other conditions should receive both PCV13 and PPSV23 vaccines. People with human immunodeficiency virus (HIV) infection should be immunized as soon as possible after diagnosis. Immunization during chemotherapy or radiation therapy should be avoided. Routine use of PPSV23 vaccine is not recommended for American Indians, [place] Natives, or people younger than 65 years unless there are medical conditions that require PPSV23 vaccine. When indicated, people who have unknown immunization and have no record of immunization should receive PPSV23 vaccine. One-time revaccination 5 years after the first dose of PPSV23 is recommended for people aged 19-64 years who have chronic kidney failure, nephrotic syndrome, asplenia, or immunocompromised conditions. People who received 1-2 doses of PPSV23 before age 65 years should receive another dose of PPSV23 vaccine at age 65 years or later if at least 5 years have passed since the previous dose. Doses of PPSV23 are not needed for people immunized with PPSV23 at or after age 65 years.  Meningococcal vaccine. Adults with asplenia or persistent complement component deficiencies should receive 2 doses of quadrivalent meningococcal conjugate (MenACWY-D) vaccine. The doses should be obtained at least 2 months apart. Microbiologists working with certain meningococcal bacteria,  military recruits, people at risk during an outbreak, and people who travel to or live in countries with a high rate of meningitis should be immunized. A first-year college student up through age 21 years who is living in a residence hall should receive a dose if he did not receive a dose on or after his 16th birthday. Adults who have certain high-risk conditions should receive one or more doses of vaccine.  Hepatitis A vaccine. Adults who wish to be protected from this disease, have certain high-risk conditions, work with hepatitis A-infected animals, work in hepatitis A research labs, or   travel to or work in countries with a high rate of hepatitis A should be immunized. Adults who were previously unvaccinated and who anticipate close contact with an international adoptee during the first 60 days after arrival in the [place] from a country with a high rate of hepatitis A should be immunized.  Hepatitis B vaccine. Adults should be immunized if they wish to be protected from this disease, have certain high-risk conditions, may be exposed to blood or other infectious body fluids, are household contacts or sex partners of hepatitis B positive people, are clients or workers in certain care facilities, or travel to or work in countries with a high rate of hepatitis B.  Haemophilus influenzae type b (Hib) vaccine. A previously unvaccinated person with asplenia or sickle cell disease or having a scheduled splenectomy should receive 1 dose of Hib vaccine. Regardless of previous immunization, a recipient of a hematopoietic stem cell transplant should receive a 3-dose series 6-12 months after his successful transplant. Hib vaccine is not recommended for adults with HIV infection. Preventive Service / Frequency Ages 52 to 17  Blood pressure check.** / Every 1 to 2 years.  Lipid and cholesterol check.** / Every 5 years beginning at age 69.  Hepatitis C blood test.** / For any individual with known risks for  hepatitis C.  Skin self-exam. / Monthly.  Influenza vaccine. / Every year.  Tetanus, diphtheria, and acellular pertussis (Tdap, Td) vaccine.** / Consult your health care provider. 1 dose of Td every 10 years.  Varicella vaccine.** / Consult your health care provider.  HPV vaccine. / 3 doses over 6 months, if 72 or younger.  Measles, mumps, rubella (MMR) vaccine.** / You need at least 1 dose of MMR if you were born in 1957 or later. You may also need a second dose.  Pneumococcal 13-valent conjugate (PCV13) vaccine.** / Consult your health care provider.  Pneumococcal polysaccharide (PPSV23) vaccine.** / 1 to 2 doses if you smoke cigarettes or if you have certain conditions.  Meningococcal vaccine.** / 1 dose if you are age 35 to 60 years and a Market researcher living in a residence hall, or have one of several medical conditions. You may also need additional booster doses.  Hepatitis A vaccine.** / Consult your health care provider.  Hepatitis B vaccine.** / Consult your health care provider.  Haemophilus influenzae type b (Hib) vaccine.** / Consult your health care provider. Ages 35 to 8  Blood pressure check.** / Every 1 to 2 years.  Lipid and cholesterol check.** / Every 5 years beginning at age 57.  Lung cancer screening. / Every year if you are aged 44-80 years and have a 30-pack-year history of smoking and currently smoke or have quit within the past 15 years. Yearly screening is stopped once you have quit smoking for at least 15 years or develop a health problem that would prevent you from having lung cancer treatment.  Fecal occult blood test (FOBT) of stool. / Every year beginning at age 55 and continuing until age 73. You may not have to do this test if you get a colonoscopy every 10 years.  Flexible sigmoidoscopy** or colonoscopy.** / Every 5 years for a flexible sigmoidoscopy or every 10 years for a colonoscopy beginning at age 28 and continuing until age  1.  Hepatitis C blood test.** / For all people born from [birth year] through 1965 and any individual with known risks for hepatitis C.  Skin self-exam. / Monthly.  Influenza vaccine. / Every  year.  Tetanus, diphtheria, and acellular pertussis (Tdap/Td) vaccine.** / Consult your health care provider. 1 dose of Td every 10 years.  Varicella vaccine.** / Consult your health care provider.  Zoster vaccine.** / 1 dose for adults aged 34 years or older.  Measles, mumps, rubella (MMR) vaccine.** / You need at least 1 dose of MMR if you were born in 1957 or later. You may also need a second dose.  Pneumococcal 13-valent conjugate (PCV13) vaccine.** / Consult your health care provider.  Pneumococcal polysaccharide (PPSV23) vaccine.** / 1 to 2 doses if you smoke cigarettes or if you have certain conditions.  Meningococcal vaccine.** / Consult your health care provider.  Hepatitis A vaccine.** / Consult your health care provider.  Hepatitis B vaccine.** / Consult your health care provider.  Haemophilus influenzae type b (Hib) vaccine.** / Consult your health care provider. Ages 76 and over  Blood pressure check.** / Every 1 to 2 years.  Lipid and cholesterol check.**/ Every 5 years beginning at age 58.  Lung cancer screening. / Every year if you are aged 27-80 years and have a 30-pack-year history of smoking and currently smoke or have quit within the past 15 years. Yearly screening is stopped once you have quit smoking for at least 15 years or develop a health problem that would prevent you from having lung cancer treatment.  Fecal occult blood test (FOBT) of stool. / Every year beginning at age 40 and continuing until age 37. You may not have to do this test if you get a colonoscopy every 10 years.  Flexible sigmoidoscopy** or colonoscopy.** / Every 5 years for a flexible sigmoidoscopy or every 10 years for a colonoscopy beginning at age 1 and continuing until age 96.  Hepatitis C blood  test.** / For all people born from [birth year] through 1965 and any individual with known risks for hepatitis C.  Abdominal aortic aneurysm (AAA) screening.** / A one-time screening for ages 12 to 58 years who are current or former smokers.  Skin self-exam. / Monthly.  Influenza vaccine. / Every year.  Tetanus, diphtheria, and acellular pertussis (Tdap/Td) vaccine.** / 1 dose of Td every 10 years.  Varicella vaccine.** / Consult your health care provider.  Zoster vaccine.** / 1 dose for adults aged 31 years or older.  Pneumococcal 13-valent conjugate (PCV13) vaccine.** / Consult your health care provider.  Pneumococcal polysaccharide (PPSV23) vaccine.** / 1 dose for all adults aged 74 years and older.  Meningococcal vaccine.** / Consult your health care provider.  Hepatitis A vaccine.** / Consult your health care provider.  Hepatitis B vaccine.** / Consult your health care provider.  Haemophilus influenzae type b (Hib) vaccine.** / Consult your health care provider. **Family history and personal history of risk and conditions may change your health care provider's recommendations. Document Released: 05/09/2001 Document Revised: 03/18/2013 Document Reviewed: 08/08/2010 [facility] Patient Information 2015 [place], [place]. This information is not intended to replace advice given to you by your health care provider. Make sure you discuss any questions you have with your health care provider.  Diabetes Mellitus and Food It is important for you to manage your blood sugar (glucose) level. Your blood glucose level can be greatly affected by what you eat. Eating healthier foods in the appropriate amounts throughout the day at about the same time each day will help you control your blood glucose level. It can also help slow or prevent worsening of your diabetes mellitus. Healthy eating may even help you improve the level of your  blood pressure and reach or maintain a healthy weight.  HOW CAN FOOD AFFECT  ME? Carbohydrates Carbohydrates affect your blood glucose level more than any other type of food. Your dietitian will help you determine how many carbohydrates to eat at each meal and teach you how to count carbohydrates. Counting carbohydrates is important to keep your blood glucose at a healthy level, especially if you are using insulin or taking certain medicines for diabetes mellitus. Alcohol Alcohol can cause sudden decreases in blood glucose (hypoglycemia), especially if you use insulin or take certain medicines for diabetes mellitus. Hypoglycemia can be a life-threatening condition. Symptoms of hypoglycemia (sleepiness, dizziness, and disorientation) are similar to symptoms of having too much alcohol.  If your health care provider has given you approval to drink alcohol, do so in moderation and use the following guidelines:  Women should not have more than one drink per day, and men should not have more than two drinks per day. One drink is equal to:  12 oz of beer.  5 oz of wine.  1 oz of hard liquor.  Do not drink on an empty stomach.  Keep yourself hydrated. Have water, diet soda, or unsweetened iced tea.  Regular soda, juice, and other mixers might contain a lot of carbohydrates and should be counted. WHAT FOODS ARE NOT RECOMMENDED? As you make food choices, it is important to remember that all foods are not the same. Some foods have fewer nutrients per serving than other foods, even though they might have the same number of calories or carbohydrates. It is difficult to get your body what it needs when you eat foods with fewer nutrients. Examples of foods that you should avoid that are high in calories and carbohydrates but low in nutrients include:  Trans fats (most processed foods list trans fats on the Nutrition Facts label).  Regular soda.  Juice.  Candy.  Sweets, such as cake, pie, doughnuts, and cookies.  Fried foods. WHAT FOODS CAN I EAT? Have nutrient-rich  foods, which will nourish your body and keep you healthy. The food you should eat also will depend on several factors, including:  The calories you need.  The medicines you take.  Your weight.  Your blood glucose level.  Your blood pressure level.  Your cholesterol level. You also should eat a variety of foods, including:  Protein, such as meat, poultry, fish, tofu, nuts, and seeds (lean animal proteins are best).  Fruits.  Vegetables.  Dairy products, such as milk, cheese, and yogurt (low fat is best).  Breads, grains, pasta, cereal, rice, and beans.  Fats such as olive oil, trans fat-free margarine, canola oil, avocado, and olives. DOES EVERYONE WITH DIABETES MELLITUS HAVE THE SAME MEAL PLAN? Because every person with diabetes mellitus is different, there is not one meal plan that works for everyone. It is very important that you meet with a dietitian who will help you create a meal plan that is just right for you. Document Released: 12/08/2004 Document Revised: 03/18/2013 Document Reviewed: 02/07/2013 [facility] Patient Information 2015 [place], [place]. This information is not intended to replace advice given to you by your health care provider. Make sure you discuss any questions you have with your health care provider.

## 2013-10-31 NOTE — Progress Notes (Signed)
Pre visit review using our clinic review tool, if applicable. No additional management support is needed unless otherwise documented below in the visit note. 

## 2013-11-01 ENCOUNTER — Encounter: Payer: Self-pay | Admitting: Family Medicine

## 2013-11-01 ENCOUNTER — Other Ambulatory Visit: Payer: Self-pay | Admitting: Family Medicine

## 2013-11-01 DIAGNOSIS — D7289 Other specified disorders of white blood cells: Secondary | ICD-10-CM

## 2013-11-05 ENCOUNTER — Encounter: Payer: Self-pay | Admitting: Family Medicine

## 2013-11-07 ENCOUNTER — Encounter: Payer: Self-pay | Admitting: Family Medicine

## 2013-11-07 DIAGNOSIS — D72819 Decreased white blood cell count, unspecified: Secondary | ICD-10-CM

## 2013-11-07 DIAGNOSIS — D7289 Other specified disorders of white blood cells: Secondary | ICD-10-CM

## 2013-11-07 NOTE — Telephone Encounter (Signed)
Ok to put in referral for [facility]-- see my chart message

## 2013-11-11 ENCOUNTER — Telehealth: Payer: Self-pay

## 2013-11-11 DIAGNOSIS — D7289 Other specified disorders of white blood cells: Secondary | ICD-10-CM

## 2013-11-11 NOTE — Telephone Encounter (Signed)
To MD for review     KP 

## 2013-11-11 NOTE — Telephone Encounter (Signed)
Caller name:Cephas Relation to pt: Call back [place]:  Reason for call: Trevor Cooke called and would like for [place] to refer him to Dr Tana Coast at [facility]. He has an appointment with him on 01/21/14. They would like for [place] to send referral plus fax any notes or test result pertaining to why he would need to see a hematologist.Fax [phone] phone number is [phone]

## 2013-11-11 NOTE — Telephone Encounter (Signed)
Ok to put referral in for [facility]-- Dr Archie Balboa---

## 2013-11-12 NOTE — Telephone Encounter (Signed)
Ref placed.      KP 

## 2014-01-07 DIAGNOSIS — Z87891 Personal history of nicotine dependence: Secondary | ICD-10-CM | POA: Diagnosis not present

## 2014-01-07 DIAGNOSIS — D72819 Decreased white blood cell count, unspecified: Secondary | ICD-10-CM | POA: Diagnosis not present

## 2014-01-07 DIAGNOSIS — D709 Neutropenia, unspecified: Secondary | ICD-10-CM | POA: Diagnosis not present

## 2014-01-07 DIAGNOSIS — L409 Psoriasis, unspecified: Secondary | ICD-10-CM | POA: Diagnosis not present

## 2014-01-07 DIAGNOSIS — Z8673 Personal history of transient ischemic attack (TIA), and cerebral infarction without residual deficits: Secondary | ICD-10-CM | POA: Diagnosis not present

## 2014-01-07 DIAGNOSIS — R634 Abnormal weight loss: Secondary | ICD-10-CM | POA: Diagnosis not present

## 2014-01-07 DIAGNOSIS — Z7982 Long term (current) use of aspirin: Secondary | ICD-10-CM | POA: Diagnosis not present

## 2014-01-30 ENCOUNTER — Ambulatory Visit: Payer: Medicare Other

## 2014-01-30 ENCOUNTER — Ambulatory Visit (INDEPENDENT_AMBULATORY_CARE_PROVIDER_SITE_OTHER): Payer: Medicare Other | Admitting: *Deleted

## 2014-01-30 DIAGNOSIS — Z23 Encounter for immunization: Secondary | ICD-10-CM | POA: Diagnosis not present

## 2014-02-03 ENCOUNTER — Ambulatory Visit: Payer: Medicare Other | Admitting: Family Medicine

## 2014-02-04 DIAGNOSIS — Z7982 Long term (current) use of aspirin: Secondary | ICD-10-CM | POA: Diagnosis not present

## 2014-02-04 DIAGNOSIS — K219 Gastro-esophageal reflux disease without esophagitis: Secondary | ICD-10-CM | POA: Diagnosis not present

## 2014-02-04 DIAGNOSIS — Z79899 Other long term (current) drug therapy: Secondary | ICD-10-CM | POA: Diagnosis not present

## 2014-02-04 DIAGNOSIS — I10 Essential (primary) hypertension: Secondary | ICD-10-CM | POA: Diagnosis not present

## 2014-02-04 DIAGNOSIS — D709 Neutropenia, unspecified: Secondary | ICD-10-CM | POA: Insufficient documentation

## 2014-02-04 DIAGNOSIS — L409 Psoriasis, unspecified: Secondary | ICD-10-CM | POA: Diagnosis not present

## 2014-02-04 DIAGNOSIS — Z8673 Personal history of transient ischemic attack (TIA), and cerebral infarction without residual deficits: Secondary | ICD-10-CM | POA: Diagnosis not present

## 2014-02-04 DIAGNOSIS — L400 Psoriasis vulgaris: Secondary | ICD-10-CM | POA: Diagnosis not present

## 2014-02-11 DIAGNOSIS — H3562 Retinal hemorrhage, left eye: Secondary | ICD-10-CM | POA: Diagnosis not present

## 2014-02-11 DIAGNOSIS — H01009 Unspecified blepharitis unspecified eye, unspecified eyelid: Secondary | ICD-10-CM | POA: Diagnosis not present

## 2014-02-11 DIAGNOSIS — H2513 Age-related nuclear cataract, bilateral: Secondary | ICD-10-CM | POA: Diagnosis not present

## 2014-02-12 ENCOUNTER — Ambulatory Visit (INDEPENDENT_AMBULATORY_CARE_PROVIDER_SITE_OTHER): Payer: Medicare Other | Admitting: Family Medicine

## 2014-02-12 ENCOUNTER — Encounter: Payer: Self-pay | Admitting: Family Medicine

## 2014-02-12 VITALS — BP 138/72 | HR 55 | Temp 97.7°F | Wt 187.0 lb

## 2014-02-12 DIAGNOSIS — E291 Testicular hypofunction: Secondary | ICD-10-CM | POA: Diagnosis not present

## 2014-02-12 DIAGNOSIS — E785 Hyperlipidemia, unspecified: Secondary | ICD-10-CM | POA: Diagnosis not present

## 2014-02-12 DIAGNOSIS — D471 Chronic myeloproliferative disease: Secondary | ICD-10-CM | POA: Diagnosis not present

## 2014-02-12 DIAGNOSIS — E119 Type 2 diabetes mellitus without complications: Secondary | ICD-10-CM | POA: Diagnosis not present

## 2014-02-12 NOTE — Progress Notes (Signed)
Subjective:    Patient ID: Trevor Cooke, male    DOB: December 25, 1940, 73 y.o.   MRN: [id number]  HPI Pt here for f/u fro heme at [facility] and lipid and dm.  No new complaints.   Past Medical History  Diagnosis Date  . CVA (cerebral infarction) 1996  . Hypertension   . Psoriasis   . Hyperlipidemia    History   Social History  . Marital Status: Married    Spouse Name: N/A    Number of Children: N/A  . Years of Education: N/A   Occupational History  . Not on file.   Social History Main Topics  . Smoking status: Former Research scientist (life sciences)  . Smokeless tobacco: Never Used  . Alcohol Use: Yes     Comment: occasional wine  . Drug Use: No  . Sexual Activity: Yes   Other Topics Concern  . Not on file   Social History Narrative   Family History  Problem Relation Age of Onset  . Colon cancer Neg Hx   . Hypertension Mother   . Hypertension Father    Current Outpatient Prescriptions  Medication Sig Dispense Refill  . aspirin 81 MG tablet Take 81 mg by mouth daily.    . calcium carbonate (OS-CAL) 600 MG TABS Take 600 mg by mouth 2 (two) times daily with a meal.    . clobetasol ointment (TEMOVATE) 0.05 % Apply topically 2 (two) times daily.    Marland Kitchen desonide (DESOWEN) 0.05 % ointment Apply topically 2 (two) times daily.    . enalapril (VASOTEC) 10 MG tablet Take 1 tablet (10 mg total) by mouth daily. 90 tablet 1  . fluocinonide (LIDEX) 0.05 % external solution Apply topically daily. Apply to scalp to treat affected areas    . fluticasone (FLONASE) 50 MCG/ACT nasal spray Place 2 sprays into both nostrils daily. 48 g 0  . furosemide (LASIX) 20 MG tablet 1 po qd 90 tablet 1  . labetalol (NORMODYNE) 100 MG tablet Take 1 tablet (100 mg total) by mouth 2 (two) times daily. 180 tablet 1  . lovastatin (MEVACOR) 40 MG tablet Take 1 tablet (40 mg total) by mouth at bedtime. 90 tablet 1  . multivitamin (THERAGRAN) per tablet Take 1 tablet by mouth daily.    Marland Kitchen NIFEdipine (PROCARDIA XL/ADALAT-CC) 60  MG 24 hr tablet Take 1 tablet (60 mg total) by mouth daily. 90 tablet 01  . NONFORMULARY OR COMPOUNDED ITEM Testosterone 50 mg /ml lipo apply 1 ml ( 4 clicks) once a day 30 gm  # 3 months 1 each 1  . omeprazole (PRILOSEC OTC) 20 MG tablet Take 20 mg by mouth daily.    . Vitamin Mixture (ESTER-C PO) Take 500 mg by mouth daily.     No current facility-administered medications for this visit.    Review of Systems As above    Objective:   Physical Exam BP 138/72 mmHg  Pulse 55  Temp(Src) 97.7 F (36.5 C) (Oral)  Wt 187 lb (84.823 kg)  SpO2 98% General appearance: alert, cooperative, appears stated age and no distress Throat: lips, mucosa, and tongue normal; teeth and gums normal Neck: no adenopathy, no carotid bruit, no JVD, supple, symmetrical, trachea midline and thyroid not enlarged, symmetric, no tenderness/mass/nodules Lungs: clear to auscultation bilaterally Heart: regular rate and rhythm, S1, S2 normal, no murmur, click, rub or gallop Extremities: extremities normal, atraumatic, no cyanosis or edema       Assessment & Plan:  1. Diabetes type 2, controlled  Check labs, con't medds - Basic metabolic panel; Future - Hepatic function panel; Future - Lipid panel; Future - Hemoglobin A1c; Future - Microalbumin / creatinine urine ratio; Future - POCT urinalysis dipstick; Future  2. Hyperlipidemia Check labs - Basic metabolic panel; Future - Hepatic function panel; Future - Lipid panel; Future - Hemoglobin A1c; Future - Microalbumin / creatinine urine ratio; Future - POCT urinalysis dipstick; Future  3. Hypogonadism male On testosterone---- consider urology  4. MDP (myeloproliferative disorder) Per heme at [facility]

## 2014-02-12 NOTE — Patient Instructions (Signed)
Diabetes and Standards of Medical Care Diabetes is complicated. You may find that your diabetes team includes a dietitian, nurse, diabetes educator, eye doctor, and more. To help everyone know what is going on and to help you get the care you deserve, the following schedule of care was developed to help keep you on track. Below are the tests, exams, vaccines, medicines, education, and plans you will need. HbA1c test This test shows how well you have controlled your glucose over the past 2-3 months. It is used to see if your diabetes management plan needs to be adjusted.   It is performed at least 2 times a year if you are meeting treatment goals.  It is performed 4 times a year if therapy has changed or if you are not meeting treatment goals. Blood pressure test  This test is performed at every routine medical visit. The goal is less than 140/90 mm Hg for most people, but 130/80 mm Hg in some cases. Ask your health care provider about your goal. Dental exam  Follow up with the dentist regularly. Eye exam  If you are diagnosed with type 1 diabetes as a child, get an exam upon reaching the age of 37 years or older and have had diabetes for 3-5 years. Yearly eye exams are recommended after that initial eye exam.  If you are diagnosed with type 1 diabetes as an adult, get an exam within 5 years of diagnosis and then yearly.  If you are diagnosed with type 2 diabetes, get an exam as soon as possible after the diagnosis and then yearly. Foot care exam  Visual foot exams are performed at every routine medical visit. The exams check for cuts, injuries, or other problems with the feet.  A comprehensive foot exam should be done yearly. This includes visual inspection as well as assessing foot pulses and testing for loss of sensation.  Check your feet nightly for cuts, injuries, or other problems with your feet. Tell your health care provider if anything is not healing. Kidney function test (urine  microalbumin)  This test is performed once a year.  Type 1 diabetes: The first test is performed 5 years after diagnosis.  Type 2 diabetes: The first test is performed at the time of diagnosis.  A serum creatinine and estimated glomerular filtration rate (eGFR) test is done once a year to assess the level of chronic kidney disease (CKD), if present. Lipid profile (cholesterol, HDL, LDL, triglycerides)  Performed every 5 years for most people.  The goal for LDL is less than 100 mg/dL. If you are at high risk, the goal is less than 70 mg/dL.  The goal for HDL is 40 mg/dL-50 mg/dL for men and 50 mg/dL-60 mg/dL for women. An HDL cholesterol of 60 mg/dL or higher gives some protection against heart disease.  The goal for triglycerides is less than 150 mg/dL. Influenza vaccine, pneumococcal vaccine, and hepatitis B vaccine  The influenza vaccine is recommended yearly.  It is recommended that people with diabetes who are over 24 years old get the pneumonia vaccine. In some cases, two separate shots may be given. Ask your health care provider if your pneumonia vaccination is up to date.  The hepatitis B vaccine is also recommended for adults with diabetes. Diabetes self-management education  Education is recommended at diagnosis and ongoing as needed. Treatment plan  Your treatment plan is reviewed at every medical visit. Document Released: 01/08/2009 Document Revised: 07/28/2013 Document Reviewed: 08/13/2012 [facility] Patient Information 2015 [place],  [place]. This information is not intended to replace advice given to you by your health care provider. Make sure you discuss any questions you have with your health care provider.  

## 2014-02-12 NOTE — Progress Notes (Signed)
Pre visit review using our clinic review tool, if applicable. No additional management support is needed unless otherwise documented below in the visit note. 

## 2014-02-12 NOTE — Assessment & Plan Note (Signed)
Consider urology referral

## 2014-02-13 ENCOUNTER — Other Ambulatory Visit (INDEPENDENT_AMBULATORY_CARE_PROVIDER_SITE_OTHER): Payer: Medicare Other

## 2014-02-13 DIAGNOSIS — E119 Type 2 diabetes mellitus without complications: Secondary | ICD-10-CM

## 2014-02-13 DIAGNOSIS — E785 Hyperlipidemia, unspecified: Secondary | ICD-10-CM | POA: Diagnosis not present

## 2014-02-13 LAB — HEMOGLOBIN A1C: Hgb A1c MFr Bld: 6 % (ref 4.6–6.5)

## 2014-02-13 LAB — HEPATIC FUNCTION PANEL
ALBUMIN: 4.6 g/dL (ref 3.5–5.2)
ALT: 24 U/L (ref 0–53)
AST: 28 U/L (ref 0–37)
Alkaline Phosphatase: 99 U/L (ref 39–117)
Bilirubin, Direct: 0.1 mg/dL (ref 0.0–0.3)
TOTAL PROTEIN: 8 g/dL (ref 6.0–8.3)
Total Bilirubin: 0.7 mg/dL (ref 0.2–1.2)

## 2014-02-13 LAB — MICROALBUMIN / CREATININE URINE RATIO
Creatinine,U: 226.6 mg/dL
MICROALB UR: 1.1 mg/dL (ref 0.0–1.9)
MICROALB/CREAT RATIO: 0.5 mg/g (ref 0.0–30.0)

## 2014-02-13 LAB — BASIC METABOLIC PANEL
BUN: 15 mg/dL (ref 6–23)
CALCIUM: 9.6 mg/dL (ref 8.4–10.5)
CO2: 32 meq/L (ref 19–32)
CREATININE: 1.3 mg/dL (ref 0.4–1.5)
Chloride: 102 mEq/L (ref 96–112)
GFR: 59.86 mL/min — AB (ref >60.00)
GLUCOSE: 108 mg/dL — AB (ref 70–99)
Potassium: 4.5 mEq/L (ref 3.5–5.1)
Sodium: 140 mEq/L (ref 135–145)

## 2014-02-13 LAB — LIPID PANEL
Cholesterol: 115 mg/dL (ref 0–200)
HDL: 39.3 mg/dL (ref >39.00)
LDL Cholesterol: 67 mg/dL (ref 0–99)
NonHDL: 75.7
TRIGLYCERIDES: 46 mg/dL (ref 0.0–149.0)
Total CHOL/HDL Ratio: 3
VLDL: 9.2 mg/dL (ref 0.0–40.0)

## 2014-02-18 DIAGNOSIS — D709 Neutropenia, unspecified: Secondary | ICD-10-CM | POA: Diagnosis not present

## 2014-02-18 DIAGNOSIS — Z7982 Long term (current) use of aspirin: Secondary | ICD-10-CM | POA: Diagnosis not present

## 2014-02-18 DIAGNOSIS — D72819 Decreased white blood cell count, unspecified: Secondary | ICD-10-CM | POA: Diagnosis not present

## 2014-03-18 ENCOUNTER — Other Ambulatory Visit: Payer: Self-pay | Admitting: Family Medicine

## 2014-03-19 NOTE — Telephone Encounter (Signed)
Last OV:  02/12/14 Labs: 02/13/14  Meds filled.

## 2014-03-23 ENCOUNTER — Other Ambulatory Visit: Payer: Self-pay

## 2014-03-23 DIAGNOSIS — R7989 Other specified abnormal findings of blood chemistry: Secondary | ICD-10-CM

## 2014-03-23 MED ORDER — NONFORMULARY OR COMPOUNDED ITEM: Status: DC

## 2014-03-26 LAB — POCT URINALYSIS DIPSTICK
BILIRUBIN UA: NEGATIVE
Blood, UA: NEGATIVE
GLUCOSE UA: NEGATIVE
Ketones, UA: NEGATIVE
Leukocytes, UA: NEGATIVE
NITRITE UA: NEGATIVE
Spec Grav, UA: 1.02
Urobilinogen, UA: 2
pH, UA: 6

## 2014-04-15 DIAGNOSIS — D709 Neutropenia, unspecified: Secondary | ICD-10-CM | POA: Diagnosis not present

## 2014-04-15 DIAGNOSIS — L400 Psoriasis vulgaris: Secondary | ICD-10-CM | POA: Diagnosis not present

## 2014-05-13 DIAGNOSIS — D709 Neutropenia, unspecified: Secondary | ICD-10-CM | POA: Diagnosis not present

## 2014-06-10 DIAGNOSIS — D709 Neutropenia, unspecified: Secondary | ICD-10-CM | POA: Diagnosis not present

## 2014-06-10 DIAGNOSIS — Z87891 Personal history of nicotine dependence: Secondary | ICD-10-CM | POA: Diagnosis not present

## 2014-06-10 DIAGNOSIS — Z7982 Long term (current) use of aspirin: Secondary | ICD-10-CM | POA: Diagnosis not present

## 2014-08-13 ENCOUNTER — Ambulatory Visit: Payer: Medicare Other | Admitting: Family Medicine

## 2014-08-20 ENCOUNTER — Ambulatory Visit: Payer: Medicare Other | Admitting: Family Medicine

## 2014-08-21 ENCOUNTER — Ambulatory Visit (INDEPENDENT_AMBULATORY_CARE_PROVIDER_SITE_OTHER): Payer: Medicare Other | Admitting: Family Medicine

## 2014-08-21 ENCOUNTER — Encounter: Payer: Self-pay | Admitting: Family Medicine

## 2014-08-21 VITALS — BP 114/70 | HR 71 | Temp 98.0°F | Wt 195.2 lb

## 2014-08-21 DIAGNOSIS — D709 Neutropenia, unspecified: Secondary | ICD-10-CM | POA: Diagnosis not present

## 2014-08-21 DIAGNOSIS — D471 Chronic myeloproliferative disease: Secondary | ICD-10-CM | POA: Diagnosis not present

## 2014-08-21 DIAGNOSIS — E785 Hyperlipidemia, unspecified: Secondary | ICD-10-CM

## 2014-08-21 DIAGNOSIS — I10 Essential (primary) hypertension: Secondary | ICD-10-CM | POA: Diagnosis not present

## 2014-08-21 DIAGNOSIS — E1169 Type 2 diabetes mellitus with other specified complication: Secondary | ICD-10-CM

## 2014-08-21 MED ORDER — ENALAPRIL MALEATE 10 MG PO TABS: 10.0000 mg | ORAL_TABLET | Freq: Every day | ORAL | Status: DC

## 2014-08-21 MED ORDER — LABETALOL HCL 100 MG PO TABS: 100.0000 mg | ORAL_TABLET | Freq: Two times a day (BID) | ORAL | Status: DC

## 2014-08-21 MED ORDER — LOVASTATIN 40 MG PO TABS: 40.0000 mg | ORAL_TABLET | Freq: Every day | ORAL | Status: DC

## 2014-08-21 MED ORDER — NIFEDIPINE ER OSMOTIC RELEASE 60 MG PO TB24: 60.0000 mg | ORAL_TABLET | Freq: Every day | ORAL | Status: DC

## 2014-08-21 MED ORDER — FUROSEMIDE 20 MG PO TABS: ORAL_TABLET | ORAL | Status: DC

## 2014-08-21 NOTE — Patient Instructions (Signed)

## 2014-08-21 NOTE — Progress Notes (Signed)
Pre visit review using our clinic review tool, if applicable. No additional management support is needed unless otherwise documented below in the visit note. 

## 2014-08-21 NOTE — Progress Notes (Signed)
Patient ID: Trevor Cooke, male    DOB: Oct 27, 1940  Age: 74 y.o. MRN: [id number]    Subjective:  Subjective HPI Trevor Cooke presents for f/u htn and hyperlipidemia and c/o dry cough and scratchy throat.   Pt seeing hematology at [facility]   Pt is taking a probiotic for digestion and feels better.  He is also weaning off his prilosec and controlling symptoms with diet.    Review of Systems  Constitutional: Negative.  Negative for diaphoresis, appetite change, fatigue and unexpected weight change.  HENT: Negative for congestion, ear pain, hearing loss, nosebleeds, postnasal drip, rhinorrhea, sinus pressure, sneezing and tinnitus.   Eyes: Negative for photophobia, pain, discharge, redness, itching and visual disturbance.  Respiratory: Negative.  Negative for cough, chest tightness, shortness of breath and wheezing.   Cardiovascular: Negative.  Negative for chest pain, palpitations and leg swelling.  Gastrointestinal: Negative for abdominal pain, constipation, blood in stool, abdominal distention and anal bleeding.  Endocrine: Negative.  Negative for cold intolerance, heat intolerance, polydipsia, polyphagia and polyuria.  Genitourinary: Negative.  Negative for dysuria, frequency and difficulty urinating.  Musculoskeletal: Negative.   Skin: Negative.   Allergic/Immunologic: Negative.   Neurological: Negative for dizziness, weakness, light-headedness, numbness and headaches.  Psychiatric/Behavioral: Negative for suicidal ideas, confusion, sleep disturbance, dysphoric mood, decreased concentration and agitation. The patient is not nervous/anxious.     History Past Medical History  Diagnosis Date  . CVA (cerebral infarction) 1996  . Hypertension   . Psoriasis   . 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   His family history includes Hypertension in his father and mother. There is no history of Colon cancer.He reports that  he has quit smoking. He has never used smokeless tobacco. He reports that he drinks alcohol. He reports that he does not use illicit drugs.  Current Outpatient Prescriptions on File Prior to Visit  Medication Sig Dispense Refill  . aspirin 81 MG tablet Take 81 mg by mouth daily.    . calcium carbonate (OS-CAL) 600 MG TABS Take 600 mg by mouth 2 (two) times daily with a meal.    . clobetasol ointment (TEMOVATE) 0.05 % Apply topically 2 (two) times daily.    Marland Kitchen desonide (DESOWEN) 0.05 % ointment Apply topically 2 (two) times daily.    . fluocinonide (LIDEX) 0.05 % external solution Apply topically daily. Apply to scalp to treat affected areas    . fluticasone (FLONASE) 50 MCG/ACT nasal spray Place 2 sprays into both nostrils daily. 48 g 0  . multivitamin (THERAGRAN) per tablet Take 1 tablet by mouth daily.    Marland Kitchen omeprazole (PRILOSEC OTC) 20 MG tablet Take 20 mg by mouth daily.    . Vitamin Mixture (ESTER-C PO) Take 500 mg by mouth daily.     No current facility-administered medications on file prior to visit.     Objective:  Objective Physical Exam  Constitutional: He is oriented to person, place, and time. Vital signs are normal. He appears well-developed and well-nourished. He is sleeping.  HENT:  Head: Normocephalic and atraumatic.  Mouth/Throat: Oropharynx is clear and moist.  Eyes: EOM are normal. Pupils are equal, round, and reactive to light.  Neck: Normal range of motion. Neck supple. No thyromegaly present.  Cardiovascular: Normal rate and regular rhythm.   No murmur heard. Pulmonary/Chest: Effort normal and breath sounds normal. No respiratory distress. He has no wheezes. He has no rales. He exhibits no tenderness.  Musculoskeletal: He exhibits no edema or tenderness.  Neurological: He is alert and oriented to person, place, and time.  Skin: Skin is warm and dry.  Psychiatric: He has a normal mood and affect. His behavior is normal. Judgment and thought content normal.  Nursing  note and vitals reviewed.  BP 114/70 mmHg  Pulse 71  Temp(Src) 98 F (36.7 C) (Oral)  Wt 195 lb 3.2 oz (88.542 kg)  SpO2 96% Wt Readings from Last 3 Encounters:  08/21/14 195 lb 3.2 oz (88.542 kg)  02/12/14 187 lb (84.823 kg)  10/31/13 193 lb 9.6 oz (87.816 kg)     Lab Results  Component Value Date   WBC 2.7* 10/31/2013   HGB 13.2 10/31/2013   HCT 39.2 10/31/2013   PLT 187.0 10/31/2013   GLUCOSE 108* 02/13/2014   CHOL 115 02/13/2014   TRIG 46.0 02/13/2014   HDL 39.30 02/13/2014   LDLCALC 67 02/13/2014   ALT 24 02/13/2014   AST 28 02/13/2014   NA 140 02/13/2014   K 4.5 02/13/2014   CL 102 02/13/2014   CREATININE 1.3 02/13/2014   BUN 15 02/13/2014   CO2 32 02/13/2014   TSH 5.72* 09/18/2012   PSA 0.45 09/05/2013   HGBA1C 6.0 02/13/2014   MICROALBUR 1.1 02/13/2014    US Soft Tissue Head/neck  09/18/2012   *RADIOLOGY REPORT*  Clinical Data: 74 year old male cystic mass right thyroid. Hypothyroidism.  THYROID ULTRASOUND  Technique: Ultrasound examination of the thyroid gland and adjacent soft tissues was performed.  Comparison:  Report of the 05/20/2002 ultrasound (no images available).  Findings:  Right thyroid lobe:  5.0 x 2.8 x 3.1 cm Left thyroid lobe:  4.5 x 1.9 x 1.6 cm Isthmus:  5 mm  Focal nodules: Right:  Complex partially cystic partially solid and vascular nodule measuring 2.4 x 2.0 x 3.0 cm (previously 2.4 x 1.9 x 2.0 cm).  Small echogenic foci may reflect calcifications. Isthmus:  None. Left:  Occasional small mixed density but mostly hypoechoic nodules, the largest 3 mm.  Lymphadenopathy:  None visualized.  IMPRESSION: 1.  Chronic mixed solid and cystic right thyroid nodule, by report only mildly increased since 05/20/2002.  This lesion does meet consensus criteria for biopsy.  Ultrasound-guided FNA could be considered as per the statement:  Management of Thyroid Nodules Detected at [place]:  Society of Radiologists in [place].  Radiology  2005; [id number]. 2.  Occasional sub centimeter hypoechoic nodules in the left lobe, inconsequential.   Original Report Authenticated By: Roselyn Reef, M.D.     Assessment & Plan:  Plan I have discontinued [name] NONFORMULARY OR COMPOUNDED ITEM. I have changed his NIFEDICAL XL to NIFEdipine. I have also changed his enalapril, furosemide, labetalol, and lovastatin. Additionally, I am having him maintain his aspirin, fluocinonide, clobetasol ointment, desonide, Vitamin Mixture (ESTER-C PO), calcium carbonate, multivitamin, fluticasone, and omeprazole.  Meds ordered this encounter  Medications  . enalapril (VASOTEC) 10 MG tablet    Sig: Take 1 tablet (10 mg total) by mouth daily.    Dispense:  90 tablet    Refill:  3  . furosemide (LASIX) 20 MG tablet    Sig: TAKE 1 TABLET EVERY DAY    Dispense:  90 tablet    Refill:  3  . labetalol (NORMODYNE) 100 MG tablet    Sig: Take 1 tablet (100 mg total) by mouth 2 (two) times daily.    Dispense:  180 tablet    Refill:  3  .  lovastatin (MEVACOR) 40 MG tablet    Sig: Take 1 tablet (40 mg total) by mouth at bedtime.    Dispense:  90 tablet    Refill:  3  . NIFEdipine (NIFEDICAL XL) 60 MG 24 hr tablet    Sig: Take 1 tablet (60 mg total) by mouth daily.    Dispense:  90 tablet    Refill:  3    Problem List Items Addressed This Visit    MDP (myeloproliferative disorder)    Per hematology at [facility]      Diabetes mellitus, type 2    Diet controlled      Relevant Medications   enalapril (VASOTEC) 10 MG tablet   lovastatin (MEVACOR) 40 MG tablet    Other Visit Diagnoses    Essential hypertension    -  Primary    Relevant Medications    enalapril (VASOTEC) 10 MG tablet    furosemide (LASIX) 20 MG tablet    labetalol (NORMODYNE) 100 MG tablet    lovastatin (MEVACOR) 40 MG tablet    NIFEdipine (NIFEDICAL XL) 60 MG 24 hr tablet    Other Relevant Orders    Basic metabolic panel    Hepatic function panel    Lipid panel     Microalbumin / creatinine urine ratio    POCT urinalysis dipstick    Hyperlipidemia        Relevant Medications    enalapril (VASOTEC) 10 MG tablet    furosemide (LASIX) 20 MG tablet    labetalol (NORMODYNE) 100 MG tablet    lovastatin (MEVACOR) 40 MG tablet    NIFEdipine (NIFEDICAL XL) 60 MG 24 hr tablet    Other Relevant Orders    Hepatic function panel    Lipid panel    Microalbumin / creatinine urine ratio    POCT urinalysis dipstick       Follow-up: Return in about 6 months (around 02/21/2015) for hypertension, hyperlipidemia, fasting.  Garnet Koyanagi, DO

## 2014-08-24 NOTE — Assessment & Plan Note (Signed)
Diet controlled.  

## 2014-08-24 NOTE — Assessment & Plan Note (Signed)
Per hematology at [facility]

## 2014-08-26 DIAGNOSIS — H2513 Age-related nuclear cataract, bilateral: Secondary | ICD-10-CM | POA: Diagnosis not present

## 2014-08-26 DIAGNOSIS — H01003 Unspecified blepharitis right eye, unspecified eyelid: Secondary | ICD-10-CM | POA: Diagnosis not present

## 2014-08-26 DIAGNOSIS — H01006 Unspecified blepharitis left eye, unspecified eyelid: Secondary | ICD-10-CM | POA: Diagnosis not present

## 2014-08-27 ENCOUNTER — Other Ambulatory Visit (INDEPENDENT_AMBULATORY_CARE_PROVIDER_SITE_OTHER): Payer: Medicare Other

## 2014-08-27 DIAGNOSIS — E785 Hyperlipidemia, unspecified: Secondary | ICD-10-CM

## 2014-08-27 DIAGNOSIS — I10 Essential (primary) hypertension: Secondary | ICD-10-CM

## 2014-08-27 LAB — MICROALBUMIN / CREATININE URINE RATIO
Creatinine,U: 88.2 mg/dL
MICROALB/CREAT RATIO: 0.8 mg/g (ref 0.0–30.0)

## 2014-08-27 LAB — HEPATIC FUNCTION PANEL
ALT: 25 U/L (ref 0–53)
AST: 23 U/L (ref 0–37)
Albumin: 4.3 g/dL (ref 3.5–5.2)
Alkaline Phosphatase: 102 U/L (ref 39–117)
Bilirubin, Direct: 0.1 mg/dL (ref 0.0–0.3)
Total Bilirubin: 0.5 mg/dL (ref 0.2–1.2)
Total Protein: 7.7 g/dL (ref 6.0–8.3)

## 2014-08-27 LAB — POCT URINALYSIS DIPSTICK
Bilirubin, UA: NEGATIVE
Blood, UA: NEGATIVE
Glucose, UA: NEGATIVE
KETONES UA: NEGATIVE
Leukocytes, UA: NEGATIVE
NITRITE UA: NEGATIVE
PROTEIN UA: NEGATIVE
Spec Grav, UA: 1.025
UROBILINOGEN UA: 0.2
pH, UA: 6

## 2014-08-27 LAB — LIPID PANEL
Cholesterol: 127 mg/dL (ref 0–200)
HDL: 37.4 mg/dL — ABNORMAL LOW (ref >39.00)
LDL CALC: 73 mg/dL (ref 0–99)
NonHDL: 89.6
Total CHOL/HDL Ratio: 3
Triglycerides: 82 mg/dL (ref 0.0–149.0)
VLDL: 16.4 mg/dL (ref 0.0–40.0)

## 2014-08-27 LAB — BASIC METABOLIC PANEL
BUN: 16 mg/dL (ref 6–23)
CALCIUM: 9.2 mg/dL (ref 8.4–10.5)
CO2: 32 meq/L (ref 19–32)
CREATININE: 1.07 mg/dL (ref 0.40–1.50)
Chloride: 101 mEq/L (ref 96–112)
GFR: 71.52 mL/min (ref >60.00)
Glucose, Bld: 119 mg/dL — ABNORMAL HIGH (ref 70–99)
POTASSIUM: 4.1 meq/L (ref 3.5–5.1)
Sodium: 138 mEq/L (ref 135–145)

## 2014-08-31 DIAGNOSIS — B351 Tinea unguium: Secondary | ICD-10-CM | POA: Diagnosis not present

## 2014-08-31 DIAGNOSIS — L821 Other seborrheic keratosis: Secondary | ICD-10-CM | POA: Diagnosis not present

## 2014-08-31 DIAGNOSIS — L729 Follicular cyst of the skin and subcutaneous tissue, unspecified: Secondary | ICD-10-CM | POA: Diagnosis not present

## 2014-08-31 DIAGNOSIS — L409 Psoriasis, unspecified: Secondary | ICD-10-CM | POA: Diagnosis not present

## 2014-09-21 ENCOUNTER — Other Ambulatory Visit: Payer: Self-pay

## 2014-10-07 DIAGNOSIS — D709 Neutropenia, unspecified: Secondary | ICD-10-CM | POA: Diagnosis not present

## 2014-11-20 ENCOUNTER — Encounter: Payer: Self-pay | Admitting: Gastroenterology

## 2014-12-02 DIAGNOSIS — D709 Neutropenia, unspecified: Secondary | ICD-10-CM | POA: Diagnosis not present

## 2014-12-02 DIAGNOSIS — Z79899 Other long term (current) drug therapy: Secondary | ICD-10-CM | POA: Diagnosis not present

## 2014-12-02 DIAGNOSIS — Z7982 Long term (current) use of aspirin: Secondary | ICD-10-CM | POA: Diagnosis not present

## 2014-12-03 DIAGNOSIS — R1314 Dysphagia, pharyngoesophageal phase: Secondary | ICD-10-CM | POA: Insufficient documentation

## 2014-12-03 DIAGNOSIS — R1310 Dysphagia, unspecified: Secondary | ICD-10-CM | POA: Diagnosis not present

## 2014-12-03 DIAGNOSIS — D702 Other drug-induced agranulocytosis: Secondary | ICD-10-CM | POA: Diagnosis not present

## 2014-12-26 LAB — HM DIABETES EYE EXAM

## 2014-12-29 DIAGNOSIS — D709 Neutropenia, unspecified: Secondary | ICD-10-CM | POA: Diagnosis not present

## 2015-01-11 DIAGNOSIS — R1310 Dysphagia, unspecified: Secondary | ICD-10-CM | POA: Diagnosis not present

## 2015-01-11 DIAGNOSIS — Z87891 Personal history of nicotine dependence: Secondary | ICD-10-CM | POA: Diagnosis not present

## 2015-01-11 DIAGNOSIS — D709 Neutropenia, unspecified: Secondary | ICD-10-CM | POA: Diagnosis not present

## 2015-01-11 DIAGNOSIS — K219 Gastro-esophageal reflux disease without esophagitis: Secondary | ICD-10-CM | POA: Diagnosis not present

## 2015-01-11 DIAGNOSIS — L409 Psoriasis, unspecified: Secondary | ICD-10-CM | POA: Diagnosis not present

## 2015-01-11 DIAGNOSIS — K449 Diaphragmatic hernia without obstruction or gangrene: Secondary | ICD-10-CM | POA: Diagnosis not present

## 2015-01-11 DIAGNOSIS — R1314 Dysphagia, pharyngoesophageal phase: Secondary | ICD-10-CM | POA: Diagnosis not present

## 2015-01-11 DIAGNOSIS — Z8673 Personal history of transient ischemic attack (TIA), and cerebral infarction without residual deficits: Secondary | ICD-10-CM | POA: Diagnosis not present

## 2015-01-11 DIAGNOSIS — Z7982 Long term (current) use of aspirin: Secondary | ICD-10-CM | POA: Diagnosis not present

## 2015-01-11 DIAGNOSIS — K2900 Acute gastritis without bleeding: Secondary | ICD-10-CM | POA: Diagnosis not present

## 2015-01-11 DIAGNOSIS — I10 Essential (primary) hypertension: Secondary | ICD-10-CM | POA: Diagnosis not present

## 2015-01-11 DIAGNOSIS — K2960 Other gastritis without bleeding: Secondary | ICD-10-CM | POA: Diagnosis not present

## 2015-01-11 DIAGNOSIS — Z79899 Other long term (current) drug therapy: Secondary | ICD-10-CM | POA: Diagnosis not present

## 2015-01-15 ENCOUNTER — Ambulatory Visit: Payer: No Typology Code available for payment source | Admitting: Gastroenterology

## 2015-01-15 DIAGNOSIS — R1310 Dysphagia, unspecified: Secondary | ICD-10-CM | POA: Diagnosis not present

## 2015-01-20 DIAGNOSIS — K224 Dyskinesia of esophagus: Secondary | ICD-10-CM | POA: Diagnosis not present

## 2015-01-20 DIAGNOSIS — R1310 Dysphagia, unspecified: Secondary | ICD-10-CM | POA: Diagnosis not present

## 2015-01-20 DIAGNOSIS — R1314 Dysphagia, pharyngoesophageal phase: Secondary | ICD-10-CM | POA: Diagnosis not present

## 2015-01-20 DIAGNOSIS — K219 Gastro-esophageal reflux disease without esophagitis: Secondary | ICD-10-CM | POA: Diagnosis not present

## 2015-01-27 DIAGNOSIS — D702 Other drug-induced agranulocytosis: Secondary | ICD-10-CM | POA: Diagnosis not present

## 2015-01-27 DIAGNOSIS — D709 Neutropenia, unspecified: Secondary | ICD-10-CM | POA: Diagnosis not present

## 2015-01-27 DIAGNOSIS — Z79899 Other long term (current) drug therapy: Secondary | ICD-10-CM | POA: Diagnosis not present

## 2015-01-27 DIAGNOSIS — L400 Psoriasis vulgaris: Secondary | ICD-10-CM | POA: Diagnosis not present

## 2015-01-28 ENCOUNTER — Ambulatory Visit (INDEPENDENT_AMBULATORY_CARE_PROVIDER_SITE_OTHER): Payer: Medicare Other

## 2015-01-28 DIAGNOSIS — Z23 Encounter for immunization: Secondary | ICD-10-CM | POA: Diagnosis not present

## 2015-03-09 ENCOUNTER — Ambulatory Visit: Payer: No Typology Code available for payment source | Admitting: Family Medicine

## 2015-03-11 ENCOUNTER — Ambulatory Visit: Payer: Medicare Other | Admitting: Family Medicine

## 2015-03-12 ENCOUNTER — Ambulatory Visit: Payer: Medicare Other | Admitting: Family Medicine

## 2015-03-19 ENCOUNTER — Ambulatory Visit (INDEPENDENT_AMBULATORY_CARE_PROVIDER_SITE_OTHER): Payer: Medicare Other | Admitting: Family Medicine

## 2015-03-19 ENCOUNTER — Encounter: Payer: Self-pay | Admitting: Family Medicine

## 2015-03-19 VITALS — BP 116/64 | HR 56 | Temp 97.8°F | Ht 69.0 in | Wt 189.0 lb

## 2015-03-19 DIAGNOSIS — R011 Cardiac murmur, unspecified: Secondary | ICD-10-CM

## 2015-03-19 DIAGNOSIS — E1151 Type 2 diabetes mellitus with diabetic peripheral angiopathy without gangrene: Secondary | ICD-10-CM | POA: Diagnosis not present

## 2015-03-19 DIAGNOSIS — H01139 Eczematous dermatitis of unspecified eye, unspecified eyelid: Secondary | ICD-10-CM

## 2015-03-19 DIAGNOSIS — I10 Essential (primary) hypertension: Secondary | ICD-10-CM

## 2015-03-19 DIAGNOSIS — E291 Testicular hypofunction: Secondary | ICD-10-CM

## 2015-03-19 DIAGNOSIS — E785 Hyperlipidemia, unspecified: Secondary | ICD-10-CM | POA: Diagnosis not present

## 2015-03-19 DIAGNOSIS — IMO0002 Reserved for concepts with insufficient information to code with codable children: Secondary | ICD-10-CM

## 2015-03-19 DIAGNOSIS — E1165 Type 2 diabetes mellitus with hyperglycemia: Secondary | ICD-10-CM

## 2015-03-19 LAB — LIPID PANEL
CHOLESTEROL: 119 mg/dL — AB (ref 125–200)
HDL: 40 mg/dL (ref >=40)
LDL Cholesterol: 43 mg/dL (ref <130)
TRIGLYCERIDES: 180 mg/dL — AB (ref <150)
Total CHOL/HDL Ratio: 3 Ratio (ref <=5.0)
VLDL: 36 mg/dL — AB (ref <30)

## 2015-03-19 LAB — COMPREHENSIVE METABOLIC PANEL
ALBUMIN: 4.4 g/dL (ref 3.6–5.1)
ALK PHOS: 84 U/L (ref 40–115)
ALT: 23 U/L (ref 9–46)
AST: 26 U/L (ref 10–35)
BILIRUBIN TOTAL: 0.4 mg/dL (ref 0.2–1.2)
BUN: 19 mg/dL (ref 7–25)
CO2: 27 mmol/L (ref 20–31)
CREATININE: 1.01 mg/dL (ref 0.70–1.18)
Calcium: 9.3 mg/dL (ref 8.6–10.3)
Chloride: 100 mmol/L (ref 98–110)
Glucose, Bld: 80 mg/dL (ref 65–99)
Potassium: 4.3 mmol/L (ref 3.5–5.3)
SODIUM: 136 mmol/L (ref 135–146)
TOTAL PROTEIN: 7.2 g/dL (ref 6.1–8.1)

## 2015-03-19 MED ORDER — CLOBETASOL PROPIONATE 0.05 % EX OINT: TOPICAL_OINTMENT | Freq: Two times a day (BID) | CUTANEOUS | Status: DC

## 2015-03-19 MED ORDER — FLUOCINONIDE 0.05 % EX SOLN: Freq: Every day | CUTANEOUS | Status: DC

## 2015-03-19 NOTE — Progress Notes (Signed)
Patient ID: Trevor Cooke, male    DOB: December 21, 1940  Age: 74 y.o. MRN: [id number]    Subjective:  Subjective HPI Trevor Cooke presents for f/u dm, cholesterol and htn.   Pt c/o pain bottom of heel that is different than the plantar fascitis.  He does not want to see a specialist yet.  He has not tried gel inserts yet.    Review of Systems  Constitutional: Negative for diaphoresis, appetite change, fatigue and unexpected weight change.  Eyes: Negative for pain, redness and visual disturbance.  Respiratory: Negative for cough, chest tightness, shortness of breath and wheezing.   Cardiovascular: Negative for chest pain, palpitations and leg swelling.  Endocrine: Negative for cold intolerance, heat intolerance, polydipsia, polyphagia and polyuria.  Genitourinary: Negative for dysuria, frequency and difficulty urinating.  Neurological: Negative for dizziness, light-headedness, numbness and headaches.  All other systems reviewed and are negative.   History Past Medical History  Diagnosis Date  . CVA (cerebral infarction) 1996  . Hypertension   . Psoriasis   . 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   His family history includes Hypertension in his father and mother. There is no history of Colon cancer.He reports that he has quit smoking. He has never used smokeless tobacco. He reports that he drinks alcohol. He reports that he does not use illicit drugs.  Current Outpatient Prescriptions on File Prior to Visit  Medication Sig Dispense Refill  . aspirin 81 MG tablet Take 81 mg by mouth daily.    . calcium carbonate (OS-CAL) 600 MG TABS Take 600 mg by mouth 2 (two) times daily with a meal.    . enalapril (VASOTEC) 10 MG tablet Take 1 tablet (10 mg total) by mouth daily. 90 tablet 3  . labetalol (NORMODYNE) 100 MG tablet Take 1 tablet (100 mg total) by mouth 2 (two) times daily. 180 tablet 3  . lovastatin (MEVACOR) 40 MG  tablet Take 1 tablet (40 mg total) by mouth at bedtime. 90 tablet 3  . multivitamin (THERAGRAN) per tablet Take 1 tablet by mouth daily.    Marland Kitchen NIFEdipine (NIFEDICAL XL) 60 MG 24 hr tablet Take 1 tablet (60 mg total) by mouth daily. 90 tablet 3  . omeprazole (PRILOSEC OTC) 20 MG tablet Take 20 mg by mouth daily. Reported on 03/19/2015     No current facility-administered medications on file prior to visit.     Objective:  Objective Physical Exam  Constitutional: He is oriented to person, place, and time. Vital signs are normal. He appears well-developed and well-nourished. He is sleeping.  HENT:  Head: Normocephalic and atraumatic.  Mouth/Throat: Oropharynx is clear and moist.  Eyes: EOM are normal. Pupils are equal, round, and reactive to light.  Neck: Normal range of motion. Neck supple. No thyromegaly present.  Cardiovascular: Normal rate and regular rhythm.   No murmur heard. Pulmonary/Chest: Effort normal and breath sounds normal. No respiratory distress. He has no wheezes. He has no rales. He exhibits no tenderness.  Musculoskeletal: He exhibits no edema or tenderness.  Neurological: He is alert and oriented to person, place, and time.  Skin: Skin is warm and dry.  Psychiatric: He has a normal mood and affect. His behavior is normal. Judgment and thought content normal.  Nursing note and vitals reviewed.  BP 116/64 mmHg  Pulse 56  Temp(Src) 97.8 F (36.6 C) (Oral)  Ht 5' 9"  (1.753 m)  Wt 189 lb (85.73 kg)  BMI 27.90 kg/m2  SpO2 99% Wt Readings from Last 3 Encounters:  03/19/15 189 lb (85.73 kg)  08/21/14 195 lb 3.2 oz (88.542 kg)  02/12/14 187 lb (84.823 kg)     Lab Results  Component Value Date   WBC 2.7* 10/31/2013   HGB 13.2 10/31/2013   HCT 39.2 10/31/2013   PLT 187.0 10/31/2013   GLUCOSE 119* 08/27/2014   CHOL 127 08/27/2014   TRIG 82.0 08/27/2014   HDL 37.40* 08/27/2014   LDLCALC 73 08/27/2014   ALT 25 08/27/2014   AST 23 08/27/2014   NA 138 08/27/2014     K 4.1 08/27/2014   CL 101 08/27/2014   CREATININE 1.07 08/27/2014   BUN 16 08/27/2014   CO2 32 08/27/2014   TSH 5.72* 09/18/2012   PSA 0.45 09/05/2013   HGBA1C 6.0 02/13/2014   MICROALBUR <0.7 08/27/2014    US Soft Tissue Head/neck  09/18/2012  *RADIOLOGY REPORT* Clinical Data: 74 year old male cystic mass right thyroid. Hypothyroidism. THYROID ULTRASOUND Technique: Ultrasound examination of the thyroid gland and adjacent soft tissues was performed. Comparison:  Report of the 05/20/2002 ultrasound (no images available). Findings: Right thyroid lobe:  5.0 x 2.8 x 3.1 cm Left thyroid lobe:  4.5 x 1.9 x 1.6 cm Isthmus:  5 mm Focal nodules: Right:  Complex partially cystic partially solid and vascular nodule measuring 2.4 x 2.0 x 3.0 cm (previously 2.4 x 1.9 x 2.0 cm).  Small echogenic foci may reflect calcifications. Isthmus:  None. Left:  Occasional small mixed density but mostly hypoechoic nodules, the largest 3 mm. Lymphadenopathy:  None visualized. IMPRESSION: 1.  Chronic mixed solid and cystic right thyroid nodule, by report only mildly increased since 05/20/2002.  This lesion does meet consensus criteria for biopsy.  Ultrasound-guided FNA could be considered as per the statement:  Management of Thyroid Nodules Detected at [place]:  [employer] in [place].  Radiology 2005; [id number]. 2.  Occasional sub centimeter hypoechoic nodules in the left lobe, inconsequential. Original Report Authenticated By: Roselyn Reef, M.D.     Assessment & Plan:  Plan I have discontinued Mr. Gertz's desonide, Vitamin Mixture (ESTER-C PO), fluticasone, and furosemide. I am also having him maintain his aspirin, calcium carbonate, multivitamin, omeprazole, enalapril, labetalol, lovastatin, NIFEdipine, fluocinonide, and clobetasol ointment.  Meds ordered this encounter  Medications  . DISCONTD: clobetasol ointment (TEMOVATE) 0.05 %    Sig: Apply topically 2 (two) times  daily.    Dispense:  30 g    Refill:  5  . DISCONTD: fluocinonide (LIDEX) 0.05 % external solution    Sig: Apply topically daily. Apply to scalp to treat affected areas    Dispense:  60 mL    Refill:  5  . fluocinonide (LIDEX) 0.05 % external solution    Sig: Apply topically daily. Apply to scalp to treat affected areas    Dispense:  60 mL    Refill:  5  . clobetasol ointment (TEMOVATE) 0.05 %    Sig: Apply topically 2 (two) times daily.    Dispense:  30 g    Refill:  5    Problem List Items Addressed This Visit      Unprioritized   Hyperlipidemia    con't pravastatin      Relevant Orders   Lipid panel   Hemoglobin A1c   Comp Met (CMET)   Essential hypertension    con't enalapril  Nifedipine and labetalol stable       Other Visit Diagnoses  Eczematous dermatitis of eyelid, unspecified laterality    -  Primary    Relevant Medications    fluocinonide (LIDEX) 0.05 % external solution    clobetasol ointment (TEMOVATE) 0.05 %    Other Relevant Orders    Lipid panel    Hemoglobin A1c    Comp Met (CMET)    DM (diabetes mellitus) type II uncontrolled, periph vascular disorder (HCC)        Relevant Orders    Lipid panel    Hemoglobin A1c    Comp Met (CMET)    Hypogonadism in male        Relevant Orders    Lipid panel    Hemoglobin A1c    Comp Met (CMET)    Undiagnosed cardiac murmurs        Relevant Orders    ECHOCARDIOGRAM COMPLETE    Lipid panel    Hemoglobin A1c    Comp Met (CMET)       Follow-up: Return in about 6 months (around 09/17/2015), or if symptoms worsen or fail to improve, for hypertension, hyperlipidemia, diabetes II.  Garnet Koyanagi, DO

## 2015-03-19 NOTE — Assessment & Plan Note (Signed)
- 

## 2015-03-19 NOTE — Progress Notes (Signed)
Pre visit review using our clinic review tool, if applicable. No additional management support is needed unless otherwise documented below in the visit note. 

## 2015-03-19 NOTE — Assessment & Plan Note (Signed)
con't enalapril  Nifedipine and labetalol stable

## 2015-03-19 NOTE — Patient Instructions (Signed)

## 2015-03-20 LAB — HEMOGLOBIN A1C
Hgb A1c MFr Bld: 6.1 % — ABNORMAL HIGH (ref <5.7)
Mean Plasma Glucose: 128 mg/dL — ABNORMAL HIGH (ref <117)

## 2015-03-24 ENCOUNTER — Ambulatory Visit (HOSPITAL_BASED_OUTPATIENT_CLINIC_OR_DEPARTMENT_OTHER): Payer: Medicare Other

## 2015-03-31 ENCOUNTER — Ambulatory Visit (HOSPITAL_BASED_OUTPATIENT_CLINIC_OR_DEPARTMENT_OTHER)
Admission: RE | Admit: 2015-03-31 | Discharge: 2015-03-31 | Disposition: A | Discharge status: Home / Self Care | Payer: Medicare Other | Source: Ambulatory Visit | Attending: Family Medicine | Admitting: Family Medicine

## 2015-03-31 DIAGNOSIS — I10 Essential (primary) hypertension: Secondary | ICD-10-CM | POA: Diagnosis not present

## 2015-03-31 DIAGNOSIS — I351 Nonrheumatic aortic (valve) insufficiency: Secondary | ICD-10-CM | POA: Insufficient documentation

## 2015-03-31 DIAGNOSIS — R011 Cardiac murmur, unspecified: Secondary | ICD-10-CM | POA: Diagnosis not present

## 2015-03-31 DIAGNOSIS — I517 Cardiomegaly: Secondary | ICD-10-CM | POA: Insufficient documentation

## 2015-03-31 DIAGNOSIS — I071 Rheumatic tricuspid insufficiency: Secondary | ICD-10-CM | POA: Diagnosis not present

## 2015-03-31 NOTE — Progress Notes (Signed)
Echocardiogram 2D Echocardiogram has been performed.  Trevor Cooke 03/31/2015, 3:44 PM

## 2015-04-07 DIAGNOSIS — D709 Neutropenia, unspecified: Secondary | ICD-10-CM | POA: Diagnosis not present

## 2015-05-03 ENCOUNTER — Ambulatory Visit (INDEPENDENT_AMBULATORY_CARE_PROVIDER_SITE_OTHER): Payer: Medicare Other

## 2015-05-03 VITALS — BP 128/72 | HR 56 | Ht 69.0 in | Wt 190.4 lb

## 2015-05-03 DIAGNOSIS — Z0000 Encounter for general adult medical examination without abnormal findings: Secondary | ICD-10-CM | POA: Diagnosis not present

## 2015-05-03 DIAGNOSIS — D471 Chronic myeloproliferative disease: Secondary | ICD-10-CM

## 2015-05-03 DIAGNOSIS — E119 Type 2 diabetes mellitus without complications: Secondary | ICD-10-CM

## 2015-05-03 NOTE — Progress Notes (Signed)
reviewed

## 2015-05-03 NOTE — Patient Instructions (Addendum)
Continue to eat heart healthy diet (full of fruits, vegetables, whole grains, lean protein, water--limit salt, fat, and sugar intake) and increase physical activity as tolerated.  Continue doing brain stimulating activities (puzzles, reading, adult coloring books, staying active) to keep memory sharp.   Continue to follow up specialists.    Remember to provide good foot care.   Schedule an appt with foot doctor as dicussed during visit.    Schedule next eye exam.    Follow up with Dr. Corinna Capra as scheduled.     Diabetes and Foot Care Diabetes may cause you to have problems because of poor blood supply (circulation) to your feet and legs. This may cause the skin on your feet to become thinner, break easier, and heal more slowly. Your skin may become dry, and the skin may peel and crack. You may also have nerve damage in your legs and feet causing decreased feeling in them. You may not notice minor injuries to your feet that could lead to infections or more serious problems. Taking care of your feet is one of the most important things you can do for yourself.  HOME CARE INSTRUCTIONS  Wear shoes at all times, even in the house. Do not go barefoot. Bare feet are easily injured.  Check your feet daily for blisters, cuts, and redness. If you cannot see the bottom of your feet, use a mirror or ask someone for help.  Wash your feet with warm water (do not use hot water) and mild soap. Then pat your feet and the areas between your toes until they are completely dry. Do not soak your feet as this can dry your skin.  Apply a moisturizing lotion or petroleum jelly (that does not contain alcohol and is unscented) to the skin on your feet and to dry, brittle toenails. Do not apply lotion between your toes.  Trim your toenails straight across. Do not dig under them or around the cuticle. File the edges of your nails with an emery board or nail file.  Do not cut corns or calluses or try to remove them with  medicine.  Wear clean socks or stockings every day. Make sure they are not too tight. Do not wear knee-high stockings since they may decrease blood flow to your legs.  Wear shoes that fit properly and have enough cushioning. To break in new shoes, wear them for just a few hours a day. This prevents you from injuring your feet. Always look in your shoes before you put them on to be sure there are no objects inside.  Do not cross your legs. This may decrease the blood flow to your feet.  If you find a minor scrape, cut, or break in the skin on your feet, keep it and the skin around it clean and dry. These areas may be cleansed with mild soap and water. Do not cleanse the area with peroxide, alcohol, or iodine.  When you remove an adhesive bandage, be sure not to damage the skin around it.  If you have a wound, look at it several times a day to make sure it is healing.  Do not use heating pads or hot water bottles. They may burn your skin. If you have lost feeling in your feet or legs, you may not know it is happening until it is too late.  Make sure your health care provider performs a complete foot exam at least annually or more often if you have foot problems. Report any cuts, sores,  or bruises to your health care provider immediately. SEEK MEDICAL CARE IF:   You have an injury that is not healing.  You have cuts or breaks in the skin.  You have an ingrown nail.  You notice redness on your legs or feet.  You feel burning or tingling in your legs or feet.  You have pain or cramps in your legs and feet.  Your legs or feet are numb.  Your feet always feel cold. SEEK IMMEDIATE MEDICAL CARE IF:   There is increasing redness, swelling, or pain in or around a wound.  There is a red line that goes up your leg.  Pus is coming from a wound.  You develop a fever or as directed by your health care provider.  You notice a bad smell coming from an ulcer or wound.   This information is  not intended to replace advice given to you by your health care provider. Make sure you discuss any questions you have with your health care provider.   Document Released: 03/10/2000 Document Revised: 11/13/2012 Document Reviewed: 08/20/2012 Elsevier Interactive Patient Education 2016 [place] in the Home  Falls can cause injuries. They can happen to people of all ages. There are many things you can do to make your home safe and to help prevent falls.  WHAT CAN I DO ON THE OUTSIDE OF MY HOME?  Regularly fix the edges of walkways and driveways and fix any cracks.  Remove anything that might make you trip as you walk through a door, such as a raised step or threshold.  Trim any bushes or trees on the path to your home.  Use bright outdoor lighting.  Clear any walking paths of anything that might make someone trip, such as rocks or tools.  Regularly check to see if handrails are loose or broken. Make sure that both sides of any steps have handrails.  Any raised decks and porches should have guardrails on the edges.  Have any leaves, snow, or ice cleared regularly.  Use sand or salt on walking paths during winter.  Clean up any spills in your garage right away. This includes oil or grease spills. WHAT CAN I DO IN THE BATHROOM?   Use night lights.  Install grab bars by the toilet and in the tub and shower. Do not use towel bars as grab bars.  Use non-skid mats or decals in the tub or shower.  If you need to sit down in the shower, use a plastic, non-slip stool.  Keep the floor dry. Clean up any water that spills on the floor as soon as it happens.  Remove soap buildup in the tub or shower regularly.  Attach bath mats securely with double-sided non-slip rug tape.  Do not have throw rugs and other things on the floor that can make you trip. WHAT CAN I DO IN THE BEDROOM?  Use night lights.  Make sure that you have a light by your bed that is easy to  reach.  Do not use any sheets or blankets that are too big for your bed. They should not hang down onto the floor.  Have a firm chair that has side arms. You can use this for support while you get dressed.  Do not have throw rugs and other things on the floor that can make you trip. WHAT CAN I DO IN THE KITCHEN?  Clean up any spills right away.  Avoid walking on wet floors.  Keep  items that you use a lot in easy-to-reach places.  If you need to reach something above you, use a strong step stool that has a grab bar.  Keep electrical cords out of the way.  Do not use floor polish or wax that makes floors slippery. If you must use wax, use non-skid floor wax.  Do not have throw rugs and other things on the floor that can make you trip. WHAT CAN I DO WITH MY STAIRS?  Do not leave any items on the stairs.  Make sure that there are handrails on both sides of the stairs and use them. Fix handrails that are broken or loose. Make sure that handrails are as long as the stairways.  Check any carpeting to make sure that it is firmly attached to the stairs. Fix any carpet that is loose or worn.  Avoid having throw rugs at the top or bottom of the stairs. If you do have throw rugs, attach them to the floor with carpet tape.  Make sure that you have a light switch at the top of the stairs and the bottom of the stairs. If you do not have them, ask someone to add them for you. WHAT ELSE CAN I DO TO HELP PREVENT FALLS?  Wear shoes that:  Do not have high heels.  Have rubber bottoms.  Are comfortable and fit you well.  Are closed at the toe. Do not wear sandals.  If you use a stepladder:  Make sure that it is fully opened. Do not climb a closed stepladder.  Make sure that both sides of the stepladder are locked into place.  Ask someone to hold it for you, if possible.  Clearly mark and make sure that you can see:  Any grab bars or handrails.  First and last steps.  Where the  edge of each step is.  Use tools that help you move around (mobility aids) if they are needed. These include:  Canes.  Walkers.  Scooters.  Crutches.  Turn on the lights when you go into a dark area. Replace any light bulbs as soon as they burn out.  Set up your furniture so you have a clear path. Avoid moving your furniture around.  If any of your floors are uneven, fix them.  If there are any pets around you, be aware of where they are.  Review your medicines with your doctor. Some medicines can make you feel dizzy. This can increase your chance of falling. Ask your doctor what other things that you can do to help prevent falls.   This information is not intended to replace advice given to you by your health care provider. Make sure you discuss any questions you have with your health care provider.   Document Released: 01/07/2009 Document Revised: 07/28/2014 Document Reviewed: 04/17/2014 Elsevier Interactive Patient Education 2016 [place], Male A healthy lifestyle and preventative care can promote health and wellness.  Maintain regular health, dental, and eye exams.  Eat a healthy diet. Foods like vegetables, fruits, whole grains, low-fat dairy products, and lean protein foods contain the nutrients you need and are low in calories. Decrease your intake of foods high in solid fats, added sugars, and salt. Get information about a proper diet from your health care provider, if necessary.  Regular physical exercise is one of the most important things you can do for your health. Most adults should get at least 150 minutes of moderate-intensity exercise (any activity that increases your  heart rate and causes you to sweat) each week. In addition, most adults need muscle-strengthening exercises on 2 or more days a week.   Maintain a healthy weight. The body mass index (BMI) is a screening tool to identify possible weight problems. It provides an estimate of  body fat based on height and weight. Your health care provider can find your BMI and can help you achieve or maintain a healthy weight. For males 20 years and older:  A BMI below 18.5 is considered underweight.  A BMI of 18.5 to 24.9 is normal.  A BMI of 25 to 29.9 is considered overweight.  A BMI of 30 and above is considered obese.  Maintain normal blood lipids and cholesterol by exercising and minimizing your intake of saturated fat. Eat a balanced diet with plenty of fruits and vegetables. Blood tests for lipids and cholesterol should begin at age 73 and be repeated every 5 years. If your lipid or cholesterol levels are high, you are over age 2, or you are at high risk for heart disease, you may need your cholesterol levels checked more frequently.Ongoing high lipid and cholesterol levels should be treated with medicines if diet and exercise are not working.  If you smoke, find out from your health care provider how to quit. If you do not use tobacco, do not start.  Lung cancer screening is recommended for adults aged 23-80 years who are at high risk for developing lung cancer because of a history of smoking. A yearly low-dose CT scan of the lungs is recommended for people who have at least a 30-pack-year history of smoking and are current smokers or have quit within the past 15 years. A pack year of smoking is smoking an average of 1 pack of cigarettes a day for 1 year (for example, a 30-pack-year history of smoking could mean smoking 1 pack a day for 30 years or 2 packs a day for 15 years). Yearly screening should continue until the smoker has stopped smoking for at least 15 years. Yearly screening should be stopped for people who develop a health problem that would prevent them from having lung cancer treatment.  If you choose to drink alcohol, do not have more than 2 drinks per day. One drink is considered to be 12 oz (360 mL) of beer, 5 oz (150 mL) of wine, or 1.5 oz (45 mL) of  liquor.  Avoid the use of street drugs. Do not share needles with anyone. Ask for help if you need support or instructions about stopping the use of drugs.  High blood pressure causes heart disease and increases the risk of stroke. High blood pressure is more likely to develop in:  People who have blood pressure in the end of the normal range (100-139/85-89 mm Hg).  People who are overweight or obese.  People who are African American.  If you are 76-35 years of age, have your blood pressure checked every 3-5 years. If you are 43 years of age or older, have your blood pressure checked every year. You should have your blood pressure measured twice--once when you are at a hospital or clinic, and once when you are not at a hospital or clinic. Record the average of the two measurements. To check your blood pressure when you are not at a hospital or clinic, you can use:  An automated blood pressure machine at a pharmacy.  A home blood pressure monitor.  If you are 106-77 years old, ask your health care  provider if you should take aspirin to prevent heart disease.  Diabetes screening involves taking a blood sample to check your fasting blood sugar level. This should be done once every 3 years after age 24 if you are at a normal weight and without risk factors for diabetes. Testing should be considered at a younger age or be carried out more frequently if you are overweight and have at least 1 risk factor for diabetes.  Colorectal cancer can be detected and often prevented. Most routine colorectal cancer screening begins at the age of 29 and continues through age 24. However, your health care provider may recommend screening at an earlier age if you have risk factors for colon cancer. On a yearly basis, your health care provider may provide home test kits to check for hidden blood in the stool. A small camera at the end of a tube may be used to directly examine the colon (sigmoidoscopy or colonoscopy)  to detect the earliest forms of colorectal cancer. Talk to your health care provider about this at age 66 when routine screening begins. A direct exam of the colon should be repeated every 5-10 years through age 60, unless early forms of precancerous polyps or small growths are found.  People who are at an increased risk for hepatitis B should be screened for this virus. You are considered at high risk for hepatitis B if:  You were born in a country where hepatitis B occurs often. Talk with your health care provider about which countries are considered high risk.  Your parents were born in a high-risk country and you have not received a shot to protect against hepatitis B (hepatitis B vaccine).  You have HIV or AIDS.  You use needles to inject street drugs.  You live with, or have sex with, someone who has hepatitis B.  You are a man who has sex with other men (MSM).  You get hemodialysis treatment.  You take certain medicines for conditions like cancer, organ transplantation, and autoimmune conditions.  Hepatitis C blood testing is recommended for all people born from [birth year] through 1967 and any individual with known risk factors for hepatitis C.  Healthy men should no longer receive prostate-specific antigen (PSA) blood tests as part of routine cancer screening. Talk to your health care provider about prostate cancer screening.  Testicular cancer screening is not recommended for adolescents or adult males who have no symptoms. Screening includes self-exam, a health care provider exam, and other screening tests. Consult with your health care provider about any symptoms you have or any concerns you have about testicular cancer.  Practice safe sex. Use condoms and avoid high-risk sexual practices to reduce the spread of sexually transmitted infections (STIs).  You should be screened for STIs, including gonorrhea and chlamydia if:  You are sexually active and are younger than 24  years.  You are older than 24 years, and your health care provider tells you that you are at risk for this type of infection.  Your sexual activity has changed since you were last screened, and you are at an increased risk for chlamydia or gonorrhea. Ask your health care provider if you are at risk.  If you are at risk of being infected with HIV, it is recommended that you take a prescription medicine daily to prevent HIV infection. This is called pre-exposure prophylaxis (PrEP). You are considered at risk if:  You are a man who has sex with other men (MSM).  You are a heterosexual  man who is sexually active with multiple partners.  You take drugs by injection.  You are sexually active with a partner who has HIV.  Talk with your health care provider about whether you are at high risk of being infected with HIV. If you choose to begin PrEP, you should first be tested for HIV. You should then be tested every 3 months for as long as you are taking PrEP.  Use sunscreen. Apply sunscreen liberally and repeatedly throughout the day. You should seek shade when your shadow is shorter than you. Protect yourself by wearing long sleeves, pants, a wide-brimmed hat, and sunglasses year round whenever you are outdoors.  Tell your health care provider of new moles or changes in moles, especially if there is a change in shape or color. Also, tell your health care provider if a mole is larger than the size of a pencil eraser.  A one-time screening for abdominal aortic aneurysm (AAA) and surgical repair of large AAAs by ultrasound is recommended for men aged 34-75 years who are current or former smokers.  Stay current with your vaccines (immunizations).   This information is not intended to replace advice given to you by your health care provider. Make sure you discuss any questions you have with your health care provider.   Document Released: 09/09/2007 Document Revised: 04/03/2014 Document Reviewed:  08/08/2010 Elsevier Interactive Patient Education [employer].

## 2015-05-03 NOTE — Progress Notes (Signed)
Pre visit review using our clinic review tool, if applicable. No additional management support is needed unless otherwise documented below in the visit note. 

## 2015-05-03 NOTE — Progress Notes (Signed)
Subjective:   Trevor Cooke is a 75 y.o. male who presents for Medicare Annual/Subsequent preventive examination.  Review of Systems: No ROS Cardiac Risk Factors include: advanced age (>38men, >71 women);diabetes mellitus;hypertension;male gender (hyperlipidemia, previous hx of stroke) Sleep patterns: sleeps at least 8 hours each night, wakes up twice to void.   Home Safety/Smoke Alarms: feels safe at home; lives at home with wife in a single-level home; smoke alarms, security system and carbon monoxide detectors present. Firearm Safety: yes and kept in safe place. Seat Belt Safety/Bike Helmet: always wears seat belt   Counseling:   Eye Exam- 12/2014 Dental- goes every 3 months Male:  CCS- 03/07/13 w/ Dr. Deatra Ina   PSA- 09/05/13, 0.45  Immunizations:  UTD Foot exam-DUE; pt did not have time to complete---deferred to podiatry.       Objective:    Vitals: BP 128/72 mmHg  Pulse 56  Ht 5\' 9"  (1.753 m)  Wt 190 lb 6.4 oz (86.365 kg)  BMI 28.10 kg/m2  SpO2 97%  Tobacco History  Smoking status  . Former Smoker  Smokeless tobacco  . Never Used     Counseling given: No   Past Medical History  Diagnosis Date  . CVA (cerebral infarction) 1996  . Hypertension   . Psoriasis   . Hyperlipidemia    Past Surgical History  Procedure Laterality Date  . 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 Colon cancer Neg Hx   . Hypertension Mother   . Hypertension Father   . Stroke Father    History  Sexual Activity  . Sexual Activity: Yes    Outpatient Encounter Prescriptions as of 05/03/2015  Medication Sig  . aspirin 81 MG tablet Take 81 mg by mouth daily.  . calcium carbonate (OS-CAL) 600 MG TABS Take 600 mg by mouth 2 (two) times daily with a meal.  . clobetasol ointment (TEMOVATE) 0.05 % Apply topically 2 (two) times daily.  . enalapril (VASOTEC) 10 MG tablet Take 1 tablet (10 mg total) by mouth daily.  . fluocinonide (LIDEX)  0.05 % external solution Apply topically daily. Apply to scalp to treat affected areas  . furosemide (LASIX) 20 MG tablet Take 20 mg by mouth daily.  Marland Kitchen labetalol (NORMODYNE) 100 MG tablet Take 1 tablet (100 mg total) by mouth 2 (two) times daily.  Marland Kitchen lovastatin (MEVACOR) 40 MG tablet Take 1 tablet (40 mg total) by mouth at bedtime.  . Multiple Vitamins-Minerals (MULTIVITAMIN WITH MINERALS) tablet Take by mouth daily.  Marland Kitchen NIFEdipine (NIFEDICAL XL) 60 MG 24 hr tablet Take 1 tablet (60 mg total) by mouth daily.  . [DISCONTINUED] multivitamin (THERAGRAN) per tablet Take 1 tablet by mouth daily.  . [DISCONTINUED] omeprazole (PRILOSEC OTC) 20 MG tablet Take 20 mg by mouth daily. Reported on 03/19/2015   No facility-administered encounter medications on file as of 05/03/2015.    Activities of Daily Living In your present state of health, do you have any difficulty performing the following activities: 05/03/2015  Hearing? N  Vision? Y  Difficulty concentrating or making decisions? N  Walking or climbing stairs? N  Dressing or bathing? N  Doing errands, shopping? N  Preparing Food and eating ? Y  Using the Toilet? N  In the past six months, have you accidently leaked urine? N  Do you have problems with loss of bowel control? N  Managing your Medications? N  Managing your Finances?  N  Housekeeping or managing your Housekeeping? N    Patient Care Team: Rosalita Chessman, DO as PCP - General (Family Medicine) Eduard Roux, MD (Gastroenterology) Turner Daniels, MD as Referring Physician (Internal Medicine) Hampton Abbot, MD as Referring Physician (Internal Medicine) Renita Papa, MD as Referring Physician (Dermatology) Reinaldo Berber III as Referring Physician (Optometry) Inda Castle, MD as Consulting Physician (Gastroenterology) Wallene Huh, DPM as Consulting Physician (Podiatry)   Dr. Keane Police in Psoriasis  Assessment:   Exercise Activities and Dietary  recommendations Current Exercise Habits:: Home exercise routine (stays active with yard work), Type of exercise: walking, Time (Minutes): 30, Frequency (Times/Week): 3, Weekly Exercise (Minutes/Week): 90  Diet: Regular diet.  Constantly watching protein; do not eat a lot meat; eat flaxseed, black beans and rice; occasionally eat a steak; watch fats and sweets; read labels a lot. Drinks 2 12oz of [employer] per week.     Goals    . lose 10 lbs by October 2017      Fall Risk Fall Risk  05/03/2015 09/05/2013  Falls in the past year? Yes No  Number falls in past yr: 2 or more -  Injury with Fall? No -  Follow up Education provided;Falls prevention discussed -   Depression Screen PHQ 2/9 Scores 05/03/2015 09/05/2013  PHQ - 2 Score 0 0    Cognitive Testing MMSE - Mini Mental State Exam 05/03/2015  Orientation to time 5  Orientation to Place 5  Registration 3  Attention/ Calculation 5  Recall 3  Language- name 2 objects 2  Language- repeat 1  Language- follow 3 step command 3  Language- read & follow direction 1  Write a sentence 1  Copy design 1  Total score 30    Immunization History  Administered Date(s) Administered  . H1N1 04/21/2008  . Influenza Split 01/30/2012  . Influenza Whole 12/28/2003, 01/02/2008, 12/29/2009  . Influenza, High Dose Seasonal PF 01/27/2013, 01/30/2014, 01/28/2015  . Pneumococcal Conjugate-13 04/07/2013  . Pneumococcal Polysaccharide-23 04/12/2010  . Td 09/25/1995  . Tdap 10/21/2010  . Zoster 01/30/2012   Screening Tests Health Maintenance  Topic Date Due  . FOOT EXAM  09/19/2014  . HEMOGLOBIN A1C  09/17/2015  . INFLUENZA VACCINE  10/26/2015  . OPHTHALMOLOGY EXAM  12/26/2015  . TETANUS/TDAP  10/20/2020  . ZOSTAVAX  Completed  . PNA vac Low Risk Adult  Completed      Plan:  Continue to eat heart healthy diet (full of fruits, vegetables, whole grains, lean protein, water--limit salt, fat, and sugar intake) and increase physical activity as  tolerated.  Continue doing brain stimulating activities (puzzles, reading, adult coloring books, staying active) to keep memory sharp.   Continue to follow up specialists.    Remember to provide good foot care.   Schedule an appt with foot doctor as discussed during visit.    Schedule next eye exam.    Follow up with Dr. Corinna Capra as scheduled.      During the course of the visit the patient was educated and counseled about the following appropriate screening and preventive services:   Vaccines to include Pneumoccal, Influenza, Hepatitis B, Td, Zostavax, HCV  Electrocardiogram  Cardiovascular Disease  Colorectal cancer screening  Diabetes screening  Prostate Cancer Screening  Glaucoma screening  Nutrition counseling   Smoking cessation counseling  Patient Instructions (the written plan) was given to the patient.    Rudene Anda, RN  05/03/2015

## 2015-05-03 NOTE — Assessment & Plan Note (Signed)
Controlled with diet and exercise, takes no medication.  Last A1C: 6.1.  On ACE/ARB for renal protection.  UTD on eye exam. Foot exam deferred to podiatry.  DM followed by Dr. [name].

## 2015-05-03 NOTE — Assessment & Plan Note (Signed)
Stable.  Followed by hematology at [facility].

## 2015-05-26 DIAGNOSIS — D709 Neutropenia, unspecified: Secondary | ICD-10-CM | POA: Diagnosis not present

## 2015-08-04 ENCOUNTER — Other Ambulatory Visit: Payer: Self-pay | Admitting: Family Medicine

## 2015-08-04 DIAGNOSIS — D708 Other neutropenia: Secondary | ICD-10-CM | POA: Diagnosis not present

## 2015-08-05 NOTE — Telephone Encounter (Signed)
Refilled patients rx request

## 2015-09-17 ENCOUNTER — Ambulatory Visit: Payer: Medicare Other | Admitting: Family Medicine

## 2015-09-21 ENCOUNTER — Ambulatory Visit (INDEPENDENT_AMBULATORY_CARE_PROVIDER_SITE_OTHER): Payer: Medicare Other | Admitting: Family Medicine

## 2015-09-21 ENCOUNTER — Encounter: Payer: Self-pay | Admitting: Family Medicine

## 2015-09-21 VITALS — BP 118/74 | HR 56 | Temp 97.9°F | Ht 67.0 in | Wt 189.6 lb

## 2015-09-21 DIAGNOSIS — E785 Hyperlipidemia, unspecified: Secondary | ICD-10-CM | POA: Diagnosis not present

## 2015-09-21 DIAGNOSIS — I10 Essential (primary) hypertension: Secondary | ICD-10-CM | POA: Diagnosis not present

## 2015-09-21 DIAGNOSIS — E079 Disorder of thyroid, unspecified: Secondary | ICD-10-CM

## 2015-09-21 DIAGNOSIS — Z8639 Personal history of other endocrine, nutritional and metabolic disease: Secondary | ICD-10-CM | POA: Diagnosis not present

## 2015-09-21 NOTE — Progress Notes (Signed)
Pre visit review using our clinic review tool, if applicable. No additional management support is needed unless otherwise documented below in the visit note. 

## 2015-09-21 NOTE — Progress Notes (Signed)
Patient ID: Trevor Cooke, male    DOB: November 09, 1940  Age: 75 y.o. MRN: [id number]    Subjective:  Subjective  HPI Trevor Cooke presents for f/u bp, cholesterol , thyroid and dm.    Review of Systems  Constitutional: Negative for diaphoresis, appetite change, fatigue and unexpected weight change.  Eyes: Negative for pain, redness and visual disturbance.  Respiratory: Negative for cough, chest tightness, shortness of breath and wheezing.   Cardiovascular: Negative for chest pain, palpitations and leg swelling.  Endocrine: Negative for cold intolerance, heat intolerance, polydipsia, polyphagia and polyuria.  Genitourinary: Negative for dysuria, frequency and difficulty urinating.  Neurological: Negative for dizziness, light-headedness, numbness and headaches.    History Past Medical History  Diagnosis Date  . CVA (cerebral infarction) 1996  . Hypertension   . Psoriasis   . 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   His family history includes Hypertension in his father and mother; Stroke in his father. There is no history of Colon cancer.He reports that he has quit smoking. He has never used smokeless tobacco. He reports that he drinks alcohol. He reports that he does not use illicit drugs.  Current Outpatient Prescriptions on File Prior to Visit  Medication Sig Dispense Refill  . aspirin 81 MG tablet Take 81 mg by mouth daily.    . calcium carbonate (OS-CAL) 600 MG TABS Take 600 mg by mouth 2 (two) times daily with a meal.    . clobetasol ointment (TEMOVATE) 0.05 % Apply topically 2 (two) times daily. 30 g 5  . fluocinonide (LIDEX) 0.05 % external solution Apply topically daily. Apply to scalp to treat affected areas 60 mL 5  . Multiple Vitamins-Minerals (MULTIVITAMIN WITH MINERALS) tablet Take by mouth daily.     No current facility-administered medications on file prior to visit.     Objective:   Objective Physical Exam  Constitutional: He is oriented to person, place, and time. Vital signs are normal. He appears well-developed and well-nourished. He is sleeping.  HENT:  Head: Normocephalic and atraumatic.  Mouth/Throat: Oropharynx is clear and moist.  Eyes: EOM are normal. Pupils are equal, round, and reactive to light.  Neck: Normal range of motion. Neck supple. No thyromegaly present.  Cardiovascular: Normal rate and regular rhythm.   No murmur heard. Pulmonary/Chest: Effort normal and breath sounds normal. No respiratory distress. He has no wheezes. He has no rales. He exhibits no tenderness.  Musculoskeletal: He exhibits no edema or tenderness.  Neurological: He is alert and oriented to person, place, and time.  Skin: Skin is warm and dry.  Psychiatric: He has a normal mood and affect. His behavior is normal. Judgment and thought content normal.  Nursing note and vitals reviewed. Sensory exam of the foot is normal, tested with the monofilament. Good pulses, no lesions or ulcers, good peripheral pulses.  BP 118/74 mmHg  Pulse 56  Temp(Src) 97.9 F (36.6 C) (Oral)  Ht 5\' 7"  (1.702 m)  Wt 189 lb 9.6 oz (86.002 kg)  BMI 29.69 kg/m2  SpO2 97% Wt Readings from Last 3 Encounters:  09/21/15 189 lb 9.6 oz (86.002 kg)  05/03/15 190 lb 6.4 oz (86.365 kg)  03/19/15 189 lb (85.73 kg)     Lab Results  Component Value Date   WBC 2.7* 10/31/2013   HGB 13.2 10/31/2013   HCT 39.2 10/31/2013   PLT 187.0 10/31/2013   GLUCOSE 80 03/19/2015   CHOL 119* 03/19/2015  TRIG 180* 03/19/2015   HDL 40 03/19/2015   LDLCALC 43 03/19/2015   ALT 23 03/19/2015   AST 26 03/19/2015   NA 136 03/19/2015   K 4.3 03/19/2015   CL 100 03/19/2015   CREATININE 1.01 03/19/2015   BUN 19 03/19/2015   CO2 27 03/19/2015   TSH 5.72* 09/18/2012   PSA 0.45 09/05/2013   HGBA1C 6.1* 03/19/2015   MICROALBUR <0.7 08/27/2014    No results found.   Assessment & Plan:  Plan I have changed Trevor Cooke's NIFEdipine, lovastatin, labetalol, furosemide, and enalapril. I am also having him maintain his aspirin, calcium carbonate, fluocinonide, clobetasol ointment, multivitamin with minerals, Zinc, and Vitamin D.  Meds ordered this encounter  Medications  . Zinc 50 MG CAPS    Sig: Take by mouth.  . Cholecalciferol (VITAMIN D) 2000 units CAPS    Sig: Take by mouth.  Marland Kitchen NIFEdipine (PROCARDIA XL/ADALAT-CC) 60 MG 24 hr tablet    Sig: Take 1 tablet (60 mg total) by mouth daily.    Dispense:  90 tablet    Refill:  3  . lovastatin (MEVACOR) 40 MG tablet    Sig: Take 1 tablet (40 mg total) by mouth at bedtime.    Dispense:  90 tablet    Refill:  3  . labetalol (NORMODYNE) 100 MG tablet    Sig: Take 1 tablet (100 mg total) by mouth 2 (two) times daily.    Dispense:  180 tablet    Refill:  3  . furosemide (LASIX) 20 MG tablet    Sig: Take 1 tablet (20 mg total) by mouth daily.    Dispense:  90 tablet    Refill:  3  . enalapril (VASOTEC) 10 MG tablet    Sig: Take 1 tablet (10 mg total) by mouth daily.    Dispense:  90 tablet    Refill:  3    Problem List Items Addressed This Visit      Unprioritized   Hyperlipidemia   Relevant Medications   NIFEdipine (PROCARDIA XL/ADALAT-CC) 60 MG 24 hr tablet   lovastatin (MEVACOR) 40 MG tablet   labetalol (NORMODYNE) 100 MG tablet   furosemide (LASIX) 20 MG tablet   enalapril (VASOTEC) 10 MG tablet   Other Relevant Orders   Lipid panel   Comprehensive metabolic panel   Essential hypertension    Stable con't meds      Relevant Medications   NIFEdipine (PROCARDIA XL/ADALAT-CC) 60 MG 24 hr tablet   lovastatin (MEVACOR) 40 MG tablet   labetalol (NORMODYNE) 100 MG tablet   furosemide (LASIX) 20 MG tablet   enalapril (VASOTEC) 10 MG tablet   Other Relevant Orders   POCT urinalysis dipstick (Completed)   TSH   Lipid panel   Comprehensive metabolic panel    Other Visit Diagnoses    Thyroid disease    -  Primary    Relevant  Medications    labetalol (NORMODYNE) 100 MG tablet    Other Relevant Orders    TSH    History of vitamin D deficiency           Follow-up: Return in about 6 months (around 03/22/2016), or if symptoms worsen or fail to improve, for annual exam, fasting.  [name], DO

## 2015-09-21 NOTE — Patient Instructions (Signed)
Hypertension Hypertension, commonly called high blood pressure, is when the force of blood pumping through your arteries is too strong. Your arteries are the blood vessels that carry blood from your heart throughout your body. A blood pressure reading consists of a higher number over a lower number, such as 110/72. The higher number (systolic) is the pressure inside your arteries when your heart pumps. The lower number (diastolic) is the pressure inside your arteries when your heart relaxes. Ideally you want your blood pressure below 120/80. Hypertension forces your heart to work harder to pump blood. Your arteries may become narrow or stiff. Having untreated or uncontrolled hypertension can cause heart attack, stroke, kidney disease, and other problems. RISK FACTORS Some risk factors for high blood pressure are controllable. Others are not.  Risk factors you cannot control include:   Race. You may be at higher risk if you are African American.  Age. Risk increases with age.  Gender. Men are at higher risk than women before age 45 years. After age 65, women are at higher risk than men. Risk factors you can control include:  Not getting enough exercise or physical activity.  Being overweight.  Getting too much fat, sugar, calories, or salt in your diet.  Drinking too much alcohol. SIGNS AND SYMPTOMS Hypertension does not usually cause signs or symptoms. Extremely high blood pressure (hypertensive crisis) may cause headache, anxiety, shortness of breath, and nosebleed. DIAGNOSIS To check if you have hypertension, your health care provider will measure your blood pressure while you are seated, with your arm held at the level of your heart. It should be measured at least twice using the same arm. Certain conditions can cause a difference in blood pressure between your right and left arms. A blood pressure reading that is higher than normal on one occasion does not mean that you need treatment. If  it is not clear whether you have high blood pressure, you may be asked to return on a different day to have your blood pressure checked again. Or, you may be asked to monitor your blood pressure at home for 1 or more weeks. TREATMENT Treating high blood pressure includes making lifestyle changes and possibly taking medicine. Living a healthy lifestyle can help lower high blood pressure. You may need to change some of your habits. Lifestyle changes may include:  Following the DASH diet. This diet is high in fruits, vegetables, and whole grains. It is low in salt, red meat, and added sugars.  Keep your sodium intake below 2,300 mg per day.  Getting at least 30-45 minutes of aerobic exercise at least 4 times per week.  Losing weight if necessary.  Not smoking.  Limiting alcoholic beverages.  Learning ways to reduce stress. Your health care provider may prescribe medicine if lifestyle changes are not enough to get your blood pressure under control, and if one of the following is true:  You are 18-59 years of age and your systolic blood pressure is above 140.  You are 60 years of age or older, and your systolic blood pressure is above 150.  Your diastolic blood pressure is above 90.  You have diabetes, and your systolic blood pressure is over 140 or your diastolic blood pressure is over 90.  You have kidney disease and your blood pressure is above 140/90.  You have heart disease and your blood pressure is above 140/90. Your personal target blood pressure may vary depending on your medical conditions, your age, and other factors. HOME CARE INSTRUCTIONS    Have your blood pressure rechecked as directed by your health care provider.   Take medicines only as directed by your health care provider. Follow the directions carefully. Blood pressure medicines must be taken as prescribed. The medicine does not work as well when you skip doses. Skipping doses also puts you at risk for  problems.  Do not smoke.   Monitor your blood pressure at home as directed by your health care provider. SEEK MEDICAL CARE IF:   You think you are having a reaction to medicines taken.  You have recurrent headaches or feel dizzy.  You have swelling in your ankles.  You have trouble with your vision. SEEK IMMEDIATE MEDICAL CARE IF:  You develop a severe headache or confusion.  You have unusual weakness, numbness, or feel faint.  You have severe chest or abdominal pain.  You vomit repeatedly.  You have trouble breathing. MAKE SURE YOU:   Understand these instructions.  Will watch your condition.  Will get help right away if you are not doing well or get worse.   This information is not intended to replace advice given to you by your health care provider. Make sure you discuss any questions you have with your health care provider.   Document Released: 03/13/2005 Document Revised: 07/28/2014 Document Reviewed: 01/03/2013 Elsevier Interactive Patient Education 2016 [employer]  

## 2015-09-22 ENCOUNTER — Other Ambulatory Visit (INDEPENDENT_AMBULATORY_CARE_PROVIDER_SITE_OTHER): Payer: Medicare Other

## 2015-09-22 DIAGNOSIS — E079 Disorder of thyroid, unspecified: Secondary | ICD-10-CM

## 2015-09-22 DIAGNOSIS — I10 Essential (primary) hypertension: Secondary | ICD-10-CM | POA: Diagnosis not present

## 2015-09-22 DIAGNOSIS — H2513 Age-related nuclear cataract, bilateral: Secondary | ICD-10-CM | POA: Diagnosis not present

## 2015-09-22 DIAGNOSIS — E785 Hyperlipidemia, unspecified: Secondary | ICD-10-CM | POA: Diagnosis not present

## 2015-09-22 DIAGNOSIS — H0289 Other specified disorders of eyelid: Secondary | ICD-10-CM | POA: Diagnosis not present

## 2015-09-22 LAB — COMPREHENSIVE METABOLIC PANEL
ALBUMIN: 4.3 g/dL (ref 3.5–5.2)
ALT: 21 U/L (ref 0–53)
AST: 29 U/L (ref 0–37)
Alkaline Phosphatase: 93 U/L (ref 39–117)
BILIRUBIN TOTAL: 0.5 mg/dL (ref 0.2–1.2)
BUN: 19 mg/dL (ref 6–23)
CALCIUM: 9.3 mg/dL (ref 8.4–10.5)
CHLORIDE: 101 meq/L (ref 96–112)
CO2: 30 meq/L (ref 19–32)
CREATININE: 1.03 mg/dL (ref 0.40–1.50)
GFR: 74.52 mL/min (ref >60.00)
Glucose, Bld: 119 mg/dL — ABNORMAL HIGH (ref 70–99)
Potassium: 4.2 mEq/L (ref 3.5–5.1)
SODIUM: 137 meq/L (ref 135–145)
Total Protein: 7.4 g/dL (ref 6.0–8.3)

## 2015-09-22 LAB — POCT URINALYSIS DIPSTICK
Bilirubin, UA: NEGATIVE
Glucose, UA: NEGATIVE
Ketones, UA: NEGATIVE
LEUKOCYTES UA: NEGATIVE
NITRITE UA: NEGATIVE
PH UA: 6
PROTEIN UA: NEGATIVE
RBC UA: NEGATIVE
Spec Grav, UA: 1.025
Urobilinogen, UA: NEGATIVE

## 2015-09-22 LAB — LIPID PANEL
CHOLESTEROL: 121 mg/dL (ref 0–200)
HDL: 37.7 mg/dL — ABNORMAL LOW (ref >39.00)
LDL CALC: 67 mg/dL (ref 0–99)
NonHDL: 82.81
TRIGLYCERIDES: 81 mg/dL (ref 0.0–149.0)
Total CHOL/HDL Ratio: 3
VLDL: 16.2 mg/dL (ref 0.0–40.0)

## 2015-09-22 LAB — TSH: TSH: 5.27 u[IU]/mL — AB (ref 0.35–4.50)

## 2015-09-22 MED ORDER — ENALAPRIL MALEATE 10 MG PO TABS: 10.0000 mg | ORAL_TABLET | Freq: Every day | ORAL | Status: DC

## 2015-09-22 MED ORDER — NIFEDIPINE ER OSMOTIC RELEASE 60 MG PO TB24: 60.0000 mg | ORAL_TABLET | Freq: Every day | ORAL | Status: DC

## 2015-09-22 MED ORDER — FUROSEMIDE 20 MG PO TABS: 20.0000 mg | ORAL_TABLET | Freq: Every day | ORAL | Status: DC

## 2015-09-22 MED ORDER — LABETALOL HCL 100 MG PO TABS: 100.0000 mg | ORAL_TABLET | Freq: Two times a day (BID) | ORAL | Status: DC

## 2015-09-22 MED ORDER — LOVASTATIN 40 MG PO TABS: 40.0000 mg | ORAL_TABLET | Freq: Every day | ORAL | Status: DC

## 2015-09-22 NOTE — Assessment & Plan Note (Signed)
Stable con't meds 

## 2015-09-27 ENCOUNTER — Telehealth: Payer: Self-pay | Admitting: Behavioral Health

## 2015-09-27 DIAGNOSIS — E785 Hyperlipidemia, unspecified: Secondary | ICD-10-CM

## 2015-09-27 DIAGNOSIS — E079 Disorder of thyroid, unspecified: Secondary | ICD-10-CM

## 2015-09-27 DIAGNOSIS — R739 Hyperglycemia, unspecified: Secondary | ICD-10-CM

## 2015-09-27 NOTE — Telephone Encounter (Signed)
Patient made aware of the below results and provider's recommendations. He verbalized understanding and did not have any questions or concerns prior to call ending.  Notes Recorded by [name], DO on 09/24/2015 at 11:13 PM Hypothyroid-- Repeat tsh with free t3, free t4 Cholesterol--- LDL goal < 100, HDL >40, TG < 150. Diet and exercise will increase HDL and decrease LDL and TG. Fish, Fish Oil, Flaxseed oil will also help increase the HDL and decrease Triglycerides.  Recheck labs in 3 months Glucose is elevated--- watch simple sugars and starches Lipid, cmp, hgba1c, tsh---- hypothyroid, hyperglycemia, hyperlipidemia.  Future lab orders placed and appointment scheduled for 12/28/15 at 9:00 AM.

## 2015-10-20 DIAGNOSIS — D696 Thrombocytopenia, unspecified: Secondary | ICD-10-CM | POA: Diagnosis not present

## 2015-10-20 DIAGNOSIS — D708 Other neutropenia: Secondary | ICD-10-CM | POA: Diagnosis not present

## 2015-11-06 DIAGNOSIS — H16142 Punctate keratitis, left eye: Secondary | ICD-10-CM | POA: Diagnosis not present

## 2015-12-08 ENCOUNTER — Encounter: Payer: Self-pay | Admitting: Family Medicine

## 2015-12-08 ENCOUNTER — Other Ambulatory Visit: Payer: Self-pay | Admitting: Family Medicine

## 2015-12-08 DIAGNOSIS — Z862 Personal history of diseases of the blood and blood-forming organs and certain disorders involving the immune mechanism: Secondary | ICD-10-CM

## 2015-12-08 NOTE — Telephone Encounter (Signed)
Happy to do that for him

## 2015-12-28 ENCOUNTER — Other Ambulatory Visit (INDEPENDENT_AMBULATORY_CARE_PROVIDER_SITE_OTHER): Payer: Medicare Other

## 2015-12-28 DIAGNOSIS — Z862 Personal history of diseases of the blood and blood-forming organs and certain disorders involving the immune mechanism: Secondary | ICD-10-CM

## 2015-12-28 DIAGNOSIS — E079 Disorder of thyroid, unspecified: Secondary | ICD-10-CM

## 2015-12-28 DIAGNOSIS — R739 Hyperglycemia, unspecified: Secondary | ICD-10-CM | POA: Diagnosis not present

## 2015-12-28 DIAGNOSIS — E785 Hyperlipidemia, unspecified: Secondary | ICD-10-CM

## 2015-12-28 LAB — CBC WITH DIFFERENTIAL/PLATELET
BASOS ABS: 0 10*3/uL (ref 0.0–0.1)
Basophils Relative: 0.5 % (ref 0.0–3.0)
EOS PCT: 5 % (ref 0.0–5.0)
Eosinophils Absolute: 0.1 10*3/uL (ref 0.0–0.7)
HCT: 36.2 % — ABNORMAL LOW (ref 39.0–52.0)
HEMOGLOBIN: 12.5 g/dL — AB (ref 13.0–17.0)
Lymphocytes Relative: 41 % (ref 12.0–46.0)
Lymphs Abs: 1 10*3/uL (ref 0.7–4.0)
MCHC: 34.5 g/dL (ref 30.0–36.0)
MCV: 94.3 fl (ref 78.0–100.0)
MONO ABS: 0.1 10*3/uL (ref 0.1–1.0)
Monocytes Relative: 5.2 % (ref 3.0–12.0)
Neutro Abs: 1.2 10*3/uL — ABNORMAL LOW (ref 1.4–7.7)
Neutrophils Relative %: 48.3 % (ref 43.0–77.0)
Platelets: 182 10*3/uL (ref 150.0–400.0)
RBC: 3.84 Mil/uL — AB (ref 4.22–5.81)
RDW: 14 % (ref 11.5–15.5)

## 2015-12-28 LAB — HEMOGLOBIN A1C: Hgb A1c MFr Bld: 6 % (ref 4.6–6.5)

## 2015-12-28 LAB — LIPID PANEL
CHOLESTEROL: 112 mg/dL (ref 0–200)
HDL: 42.3 mg/dL (ref >39.00)
LDL CALC: 50 mg/dL (ref 0–99)
NonHDL: 69.41
TRIGLYCERIDES: 95 mg/dL (ref 0.0–149.0)
Total CHOL/HDL Ratio: 3
VLDL: 19 mg/dL (ref 0.0–40.0)

## 2015-12-28 LAB — T4, FREE: FREE T4: 0.71 ng/dL (ref 0.60–1.60)

## 2015-12-28 LAB — T3, FREE: T3 FREE: 2.9 pg/mL (ref 2.3–4.2)

## 2015-12-28 LAB — TSH: TSH: 7.08 u[IU]/mL — ABNORMAL HIGH (ref 0.35–4.50)

## 2015-12-29 LAB — COMPLETE METABOLIC PANEL WITH GFR
ALBUMIN: 4.3 g/dL (ref 3.6–5.1)
ALK PHOS: 90 U/L (ref 40–115)
ALT: 17 U/L (ref 9–46)
AST: 25 U/L (ref 10–35)
BILIRUBIN TOTAL: 0.5 mg/dL (ref 0.2–1.2)
BUN: 16 mg/dL (ref 7–25)
CO2: 26 mmol/L (ref 20–31)
Calcium: 9.3 mg/dL (ref 8.6–10.3)
Chloride: 102 mmol/L (ref 98–110)
Creat: 1.09 mg/dL (ref 0.70–1.18)
GFR, Est African American: 76 mL/min (ref >=60)
GFR, Est Non African American: 66 mL/min (ref >=60)
GLUCOSE: 112 mg/dL — AB (ref 65–99)
Potassium: 3.9 mmol/L (ref 3.5–5.3)
SODIUM: 141 mmol/L (ref 135–146)
TOTAL PROTEIN: 7.3 g/dL (ref 6.1–8.1)

## 2015-12-31 ENCOUNTER — Encounter: Payer: Self-pay | Admitting: Family Medicine

## 2015-12-31 NOTE — Telephone Encounter (Signed)
Dr.Blyth would you mind signing off on his labs please.    KP

## 2016-01-03 ENCOUNTER — Other Ambulatory Visit: Payer: Self-pay

## 2016-01-03 DIAGNOSIS — R7989 Other specified abnormal findings of blood chemistry: Secondary | ICD-10-CM

## 2016-01-03 MED ORDER — LEVOTHYROXINE SODIUM 50 MCG PO TABS: 50.0000 ug | ORAL_TABLET | Freq: Every day | ORAL | 2 refills | Status: DC

## 2016-01-03 NOTE — Telephone Encounter (Signed)
See lab result.    KP

## 2016-01-10 ENCOUNTER — Encounter: Payer: Self-pay | Admitting: Family Medicine

## 2016-01-10 NOTE — Telephone Encounter (Signed)
Please review and advise     KP 

## 2016-01-11 NOTE — Telephone Encounter (Signed)
Dr.Paz, please review the patient's response and advise.   KP

## 2016-01-17 DIAGNOSIS — B351 Tinea unguium: Secondary | ICD-10-CM | POA: Diagnosis not present

## 2016-01-17 DIAGNOSIS — L400 Psoriasis vulgaris: Secondary | ICD-10-CM | POA: Diagnosis not present

## 2016-01-17 DIAGNOSIS — L821 Other seborrheic keratosis: Secondary | ICD-10-CM | POA: Diagnosis not present

## 2016-01-17 DIAGNOSIS — L853 Xerosis cutis: Secondary | ICD-10-CM | POA: Diagnosis not present

## 2016-01-27 ENCOUNTER — Ambulatory Visit (INDEPENDENT_AMBULATORY_CARE_PROVIDER_SITE_OTHER): Payer: Medicare Other

## 2016-01-27 DIAGNOSIS — Z23 Encounter for immunization: Secondary | ICD-10-CM

## 2016-03-02 ENCOUNTER — Encounter: Payer: Self-pay | Admitting: Family Medicine

## 2016-03-02 ENCOUNTER — Ambulatory Visit (INDEPENDENT_AMBULATORY_CARE_PROVIDER_SITE_OTHER): Payer: Medicare Other | Admitting: Family Medicine

## 2016-03-02 VITALS — BP 146/74 | HR 49 | Temp 97.6°F | Resp 16 | Ht 67.0 in | Wt 186.4 lb

## 2016-03-02 DIAGNOSIS — Z0000 Encounter for general adult medical examination without abnormal findings: Secondary | ICD-10-CM

## 2016-03-02 DIAGNOSIS — E785 Hyperlipidemia, unspecified: Secondary | ICD-10-CM | POA: Diagnosis not present

## 2016-03-02 DIAGNOSIS — E039 Hypothyroidism, unspecified: Secondary | ICD-10-CM | POA: Diagnosis not present

## 2016-03-02 DIAGNOSIS — E1151 Type 2 diabetes mellitus with diabetic peripheral angiopathy without gangrene: Secondary | ICD-10-CM

## 2016-03-02 DIAGNOSIS — I10 Essential (primary) hypertension: Secondary | ICD-10-CM

## 2016-03-02 LAB — LIPID PANEL
CHOLESTEROL: 118 mg/dL (ref 0–200)
HDL: 41.7 mg/dL (ref >39.00)
LDL Cholesterol: 58 mg/dL (ref 0–99)
NonHDL: 75.86
TRIGLYCERIDES: 89 mg/dL (ref 0.0–149.0)
Total CHOL/HDL Ratio: 3
VLDL: 17.8 mg/dL (ref 0.0–40.0)

## 2016-03-02 LAB — COMPREHENSIVE METABOLIC PANEL
ALBUMIN: 4.4 g/dL (ref 3.5–5.2)
ALK PHOS: 100 U/L (ref 39–117)
ALT: 24 U/L (ref 0–53)
AST: 29 U/L (ref 0–37)
BILIRUBIN TOTAL: 0.5 mg/dL (ref 0.2–1.2)
BUN: 14 mg/dL (ref 6–23)
CALCIUM: 9.5 mg/dL (ref 8.4–10.5)
CO2: 34 mEq/L — ABNORMAL HIGH (ref 19–32)
Chloride: 103 mEq/L (ref 96–112)
Creatinine, Ser: 1.06 mg/dL (ref 0.40–1.50)
GFR: 72.01 mL/min (ref >60.00)
Glucose, Bld: 101 mg/dL — ABNORMAL HIGH (ref 70–99)
POTASSIUM: 4.1 meq/L (ref 3.5–5.1)
Sodium: 144 mEq/L (ref 135–145)
TOTAL PROTEIN: 7.8 g/dL (ref 6.0–8.3)

## 2016-03-02 LAB — TSH: TSH: 1.27 u[IU]/mL (ref 0.35–4.50)

## 2016-03-02 LAB — CBC WITH DIFFERENTIAL/PLATELET
BASOS PCT: 0.6 % (ref 0.0–3.0)
Basophils Absolute: 0 10*3/uL (ref 0.0–0.1)
EOS ABS: 0.1 10*3/uL (ref 0.0–0.7)
Eosinophils Relative: 3.5 % (ref 0.0–5.0)
HCT: 35.7 % — ABNORMAL LOW (ref 39.0–52.0)
Hemoglobin: 12.1 g/dL — ABNORMAL LOW (ref 13.0–17.0)
Lymphocytes Relative: 41.9 % (ref 12.0–46.0)
Lymphs Abs: 0.9 10*3/uL (ref 0.7–4.0)
MCHC: 34 g/dL (ref 30.0–36.0)
MCV: 94.9 fl (ref 78.0–100.0)
MONO ABS: 0.1 10*3/uL (ref 0.1–1.0)
Monocytes Relative: 4.7 % (ref 3.0–12.0)
NEUTROS ABS: 1 10*3/uL — AB (ref 1.4–7.7)
Neutrophils Relative %: 49.3 % (ref 43.0–77.0)
PLATELETS: 171 10*3/uL (ref 150.0–400.0)
RBC: 3.76 Mil/uL — ABNORMAL LOW (ref 4.22–5.81)
RDW: 13.3 % (ref 11.5–15.5)
WBC: 2.1 10*3/uL — ABNORMAL LOW (ref 4.0–10.5)

## 2016-03-02 LAB — HEMOGLOBIN A1C: HEMOGLOBIN A1C: 6.1 % (ref 4.6–6.5)

## 2016-03-02 NOTE — Progress Notes (Signed)
Subjective:   MIKING COVIN is a 75 y.o. male who presents for Medicare Annual/Subsequent preventive examination.  Review of Systems:     Review of Systems  Constitutional: Negative for activity change, appetite change and fatigue.  HENT: Negative for hearing loss, congestion, tinnitus and ear discharge.   Eyes: Negative for visual disturbance (see optho q1y -- vision corrected to 20/20 with glasses).  Respiratory: Negative for cough, chest tightness and shortness of breath.   Cardiovascular: Negative for chest pain, palpitations and leg swelling.  Gastrointestinal: Negative for abdominal pain, diarrhea, constipation and abdominal distention.  Genitourinary: Negative for urgency, frequency, decreased urine volume and difficulty urinating.  Musculoskeletal: Negative for back pain, arthralgias and gait problem.  Skin: Negative for color change, pallor and rash.  Neurological: Negative for dizziness, light-headedness, numbness and headaches.  Hematological: Negative for adenopathy. Does not bruise/bleed easily.  Psychiatric/Behavioral: Negative for suicidal ideas, confusion, sleep disturbance, self-injury, dysphoric mood, decreased concentration and agitation.  Pt is able to read and write and can do all ADLs No risk for falling No abuse/ violence in home       Objective:    Vitals: BP (!) 146/74 (BP Location: Right Arm, Patient Position: Sitting, Cuff Size: Normal)   Pulse (!) 49   Temp 97.6 F (36.4 C) (Oral)   Resp 16   Ht 5\' 7"  (1.702 m)   Wt 186 lb 6.4 oz (84.6 kg)   SpO2 99%   BMI 29.19 kg/m   Body mass index is 29.19 kg/m. BP (!) 146/74 (BP Location: Right Arm, Patient Position: Sitting, Cuff Size: Normal)   Pulse (!) 49   Temp 97.6 F (36.4 C) (Oral)   Resp 16   Ht 5\' 7"  (1.702 m)   Wt 186 lb 6.4 oz (84.6 kg)   SpO2 99%   BMI 29.19 kg/m  General appearance: alert, cooperative, appears stated age and no distress Head: Normocephalic, without obvious  abnormality, atraumatic Eyes: negative findings: lids and lashes normal, conjunctivae and sclerae normal and pupils equal, round, reactive to light and accomodation Ears: normal TM's and external ear canals both ears Nose: Nares normal. Septum midline. Mucosa normal. No drainage or sinus tenderness. Throat: lips, mucosa, and tongue normal; teeth and gums normal Neck: no adenopathy, no carotid bruit, no JVD, supple, symmetrical, trachea midline and thyroid not enlarged, symmetric, no tenderness/mass/nodules Back: symmetric, no curvature. ROM normal. No CVA tenderness. Lungs: clear to auscultation bilaterally Chest wall: no tenderness Heart: S1, S2 normal Abdomen: soft, non-tender; bowel sounds normal; no masses,  no organomegaly Male genitalia: normal Rectal: normal tone, normal prostate, no masses or tenderness and soft brown guaiac negative stool noted Extremities: extremities normal, atraumatic, no cyanosis or edema Pulses: 2+ and symmetric Skin: Skin color, texture, turgor normal. No rashes or lesions Lymph nodes: Cervical, supraclavicular, and axillary nodes normal. Neurologic: Alert and oriented X 3, normal strength and tone. Normal symmetric reflexes. Normal coordination and gait Tobacco History  Smoking Status  . Former Smoker  Smokeless Tobacco  . Never Used     Counseling given: Not Answered   Past Medical History:  Diagnosis Date  . CVA (cerebral infarction) 1996  . Hyperlipidemia   . Hypertension   . Psoriasis    Past Surgical History:  Procedure Laterality Date  . CLEFT PALATE REPAIR  1940's   left    Family History  Problem Relation Age of Onset  . Hypertension Mother   . Hypertension Father   . Stroke Father   .  Colon cancer Neg Hx    History  Sexual Activity  . Sexual activity: Yes    Outpatient Encounter Prescriptions as of 03/02/2016  Medication Sig  . aspirin 81 MG tablet Take 81 mg by mouth daily.  Marland Kitchen Bioflavonoid Products (ESTER C PO) Take 500  mg by mouth daily.  . calcium carbonate (OS-CAL) 600 MG TABS Take 600 mg by mouth 2 (two) times daily with a meal.  . Cholecalciferol (VITAMIN D) 2000 units CAPS Take by mouth.  . clobetasol ointment (TEMOVATE) 0.05 % Apply topically 2 (two) times daily.  . enalapril (VASOTEC) 10 MG tablet Take 1 tablet (10 mg total) by mouth daily.  . fluocinonide (LIDEX) 0.05 % external solution Apply topically daily. Apply to scalp to treat affected areas  . furosemide (LASIX) 20 MG tablet Take 1 tablet (20 mg total) by mouth daily.  Marland Kitchen labetalol (NORMODYNE) 100 MG tablet Take 1 tablet (100 mg total) by mouth 2 (two) times daily.  Marland Kitchen levothyroxine (SYNTHROID, LEVOTHROID) 50 MCG tablet Take 1 tablet (50 mcg total) by mouth daily.  Marland Kitchen lovastatin (MEVACOR) 40 MG tablet Take 1 tablet (40 mg total) by mouth at bedtime.  . Multiple Vitamins-Minerals (MULTIVITAMIN WITH MINERALS) tablet Take by mouth daily.  Marland Kitchen NIFEdipine (PROCARDIA XL/ADALAT-CC) 60 MG 24 hr tablet Take 1 tablet (60 mg total) by mouth daily.  . Zinc 50 MG CAPS Take by mouth.   No facility-administered encounter medications on file as of 03/02/2016.     Activities of Daily Living In your present state of health, do you have any difficulty performing the following activities: 03/02/2016 05/03/2015  Hearing? Y N  Vision? N Y  Difficulty concentrating or making decisions? N N  Walking or climbing stairs? N N  Dressing or bathing? N N  Doing errands, shopping? N N  Preparing Food and eating ? - Y  Using the Toilet? - N  In the past six months, have you accidently leaked urine? - N  Do you have problems with loss of bowel control? - N  Managing your Medications? - N  Managing your Finances? - N  Housekeeping or managing your Housekeeping? - N  Some recent data might be hidden    Patient Care Team: [name], DO as PCP - General (Family Medicine) Eduard Roux, MD (Gastroenterology) Turner Daniels, MD as Referring Physician (Internal  Medicine) Hampton Abbot, MD as Referring Physician (Internal Medicine) Renita Papa, MD as Referring Physician (Dermatology) Reinaldo Berber III as Referring Physician (Optometry) Inda Castle, MD as Consulting Physician (Gastroenterology) Wallene Huh, DPM as Consulting Physician (Podiatry)   Assessment:    cpe Exercise Activities and Dietary recommendations Current Exercise Habits: Home exercise routine, Type of exercise: walking, Intensity: Moderate  Goals    . lose 10 lbs by October 2017 (pt-stated)      Fall Risk Fall Risk  03/02/2016 05/03/2015 09/05/2013  Falls in the past year? No Yes No  Number falls in past yr: - 2 or more -  Injury with Fall? - No -  Follow up - Education provided;Falls prevention discussed -   Depression Screen PHQ 2/9 Scores 03/02/2016 05/03/2015 09/05/2013  PHQ - 2 Score 0 0 0    Cognitive Function MMSE - Mini Mental State Exam 03/02/2016 05/03/2015  Orientation to time 5 5  Orientation to Place 5 5  Registration 3 3  Attention/ Calculation 5 5  Recall 3 3  Language- name 2 objects 2 2  Language- repeat  1 1  Language- follow 3 step command 3 3  Language- read & follow direction 1 1  Write a sentence 1 1  Copy design 1 1  Total score 30 30        Immunization History  Administered Date(s) Administered  . H1N1 04/21/2008  . Influenza Split 01/30/2012  . Influenza Whole 12/28/2003, 01/02/2008, 12/29/2009  . Influenza, High Dose Seasonal PF 01/27/2013, 01/30/2014, 01/28/2015, 01/27/2016  . Pneumococcal Conjugate-13 04/07/2013  . Pneumococcal Polysaccharide-23 04/12/2010  . Td 09/25/1995  . Tdap 10/21/2010  . Zoster 01/30/2012   Screening Tests Health Maintenance  Topic Date Due  . FOOT EXAM  09/19/2014  . OPHTHALMOLOGY EXAM  08/25/2016  . HEMOGLOBIN A1C  08/31/2016  . TETANUS/TDAP  10/20/2020  . INFLUENZA VACCINE  Completed  . ZOSTAVAX  Completed  . PNA vac Low Risk Adult  Completed      Plan:    During the course of  the visit the patient was educated and counseled about the following appropriate screening and preventive services:   Vaccines to include Pneumoccal, Influenza, Hepatitis B, Td, Zostavax, HCV  Electrocardiogram  Cardiovascular Disease  Colorectal cancer screening  Diabetes screening  Prostate Cancer Screening  Glaucoma screening  Nutrition counseling   Smoking cessation counseling  Patient Instructions (the written plan) was given to the patient.  1. Hyperlipidemia, unspecified hyperlipidemia type Lovastatin, check labs - Lipid panel - Comprehensive metabolic panel  2. Hypothyroidism, unspecified type con't synthroid Check labs - TSH  3. Essential hypertension con't labetalol and enalapril - Lipid panel - Comprehensive metabolic panel - TSH - CBC with Differential/Platelet  4. DM (diabetes mellitus) type II controlled peripheral vascular disorder (HCC) Check labs con't diet - Hemoglobin A1c  5. Encounter for Medicare annual wellness exam See above  [name], DO  03/04/2016

## 2016-03-02 NOTE — Patient Instructions (Signed)
Preventive Care 77 Years and Older, Male Preventive care refers to lifestyle choices and visits with your health care provider that can promote health and wellness. What does preventive care include?  A yearly physical exam. This is also called an annual well check.  Dental exams once or twice a year.  Routine eye exams. Ask your health care provider how often you should have your eyes checked.  Personal lifestyle choices, including:  Daily care of your teeth and gums.  Regular physical activity.  Eating a healthy diet.  Avoiding tobacco and drug use.  Limiting alcohol use.  Practicing safe sex.  Taking low doses of aspirin every day.  Taking vitamin and mineral supplements as recommended by your health care provider. What happens during an annual well check? The services and screenings done by your health care provider during your annual well check will depend on your age, overall health, lifestyle risk factors, and family history of disease. Counseling  Your health care provider may ask you questions about your:  Alcohol use.  Tobacco use.  Drug use.  Emotional well-being.  Home and relationship well-being.  Sexual activity.  Eating habits.  History of falls.  Memory and ability to understand (cognition).  Work and work environment. Screening  You may have the following tests or measurements:  Height, weight, and BMI.  Blood pressure.  Lipid and cholesterol levels. These may be checked every 5 years, or more frequently if you are over 50 years old.  Skin check.  Lung cancer screening. You may have this screening every year starting at age 55 if you have a 30-pack-year history of smoking and currently smoke or have quit within the past 15 years.  Fecal occult blood test (FOBT) of the stool. You may have this test every year starting at age 50.  Flexible sigmoidoscopy or colonoscopy. You may have a sigmoidoscopy every 5 years or a colonoscopy every 10  years starting at age 50.  Prostate cancer screening. Recommendations will vary depending on your family history and other risks.  Hepatitis C blood test.  Hepatitis B blood test.  Sexually transmitted disease (STD) testing.  Diabetes screening. This is done by checking your blood sugar (glucose) after you have not eaten for a while (fasting). You may have this done every 1-3 years.  Abdominal aortic aneurysm (AAA) screening. You may need this if you are a current or former smoker.  Osteoporosis. You may be screened starting at age 70 if you are at high risk. Talk with your health care provider about your test results, treatment options, and if necessary, the need for more tests. Vaccines  Your health care provider may recommend certain vaccines, such as:  Influenza vaccine. This is recommended every year.  Tetanus, diphtheria, and acellular pertussis (Tdap, Td) vaccine. You may need a Td booster every 10 years.  Varicella vaccine. You may need this if you have not been vaccinated.  Zoster vaccine. You may need this after age 60.  Measles, mumps, and rubella (MMR) vaccine. You may need at least one dose of MMR if you were born in 1957 or later. You may also need a second dose.  Pneumococcal 13-valent conjugate (PCV13) vaccine. One dose is recommended after age 65.  Pneumococcal polysaccharide (PPSV23) vaccine. One dose is recommended after age 65.  Meningococcal vaccine. You may need this if you have certain conditions.  Hepatitis A vaccine. You may need this if you have certain conditions or if you travel or work in places where   you may be exposed to hepatitis A.  Hepatitis B vaccine. You may need this if you have certain conditions or if you travel or work in places where you may be exposed to hepatitis B.  Haemophilus influenzae type b (Hib) vaccine. You may need this if you have certain risk factors. Talk to your health care provider about which screenings and vaccines you  need and how often you need them. This information is not intended to replace advice given to you by your health care provider. Make sure you discuss any questions you have with your health care provider. Document Released: 04/09/2015 Document Revised: 12/01/2015 Document Reviewed: 01/12/2015 Elsevier Interactive Patient Education  2017 [employer].

## 2016-03-04 ENCOUNTER — Encounter: Payer: Self-pay | Admitting: Family Medicine

## 2016-03-05 ENCOUNTER — Encounter: Payer: Self-pay | Admitting: Family Medicine

## 2016-03-06 ENCOUNTER — Telehealth: Payer: Self-pay | Admitting: *Deleted

## 2016-03-06 DIAGNOSIS — D729 Disorder of white blood cells, unspecified: Secondary | ICD-10-CM

## 2016-03-06 NOTE — Telephone Encounter (Signed)
Patient notified that referral has been sent and that we can check his white cell count until he gets in.

## 2016-03-06 NOTE — Telephone Encounter (Signed)
Pt has a hematologist at [facility]--  I think he said something about wanting to switch to hematology up stairs--- we can do that if he wants [place] too In meantime we can check cbcd [id number]

## 2016-03-06 NOTE — Telephone Encounter (Signed)
Patient notified of lab results.  He is worried about his WBC count and what you recommend about that. He wants to know if we can recheck his WBC count every 3 months? Are we getting him in with hematology?  What are your recommendations?   Please call patient back do not mychart.

## 2016-03-07 ENCOUNTER — Encounter: Payer: Self-pay | Admitting: Family Medicine

## 2016-03-07 ENCOUNTER — Telehealth: Payer: Self-pay

## 2016-03-07 MED ORDER — GLUCOSE BLOOD VI STRP: ORAL_STRIP | 5 refills | Status: DC

## 2016-03-07 NOTE — Telephone Encounter (Signed)
-----   Message from [name], DO sent at 03/06/2016  1:33 PM EST ----- Please forward labs to hes hematologist at [facility]

## 2016-03-07 NOTE — Telephone Encounter (Signed)
03/02/16 lab results sent to hematologist per providers request. LB

## 2016-03-08 MED ORDER — LEVOTHYROXINE SODIUM 50 MCG PO TABS
50.0000 ug | ORAL_TABLET | Freq: Every day | ORAL | 3 refills | Status: DC
Start: 2016-03-08 — End: 2016-09-04

## 2016-03-16 ENCOUNTER — Ambulatory Visit (HOSPITAL_BASED_OUTPATIENT_CLINIC_OR_DEPARTMENT_OTHER): Payer: Medicare Other | Admitting: Hematology & Oncology

## 2016-03-16 ENCOUNTER — Other Ambulatory Visit (HOSPITAL_BASED_OUTPATIENT_CLINIC_OR_DEPARTMENT_OTHER): Payer: Medicare Other

## 2016-03-16 ENCOUNTER — Encounter: Payer: Self-pay | Admitting: Hematology & Oncology

## 2016-03-16 ENCOUNTER — Ambulatory Visit: Payer: Medicare Other

## 2016-03-16 VITALS — BP 140/74 | HR 62 | Temp 97.9°F | Resp 16 | Wt 192.0 lb

## 2016-03-16 DIAGNOSIS — D509 Iron deficiency anemia, unspecified: Secondary | ICD-10-CM

## 2016-03-16 DIAGNOSIS — L409 Psoriasis, unspecified: Secondary | ICD-10-CM

## 2016-03-16 DIAGNOSIS — D708 Other neutropenia: Secondary | ICD-10-CM

## 2016-03-16 DIAGNOSIS — Z87891 Personal history of nicotine dependence: Secondary | ICD-10-CM | POA: Diagnosis not present

## 2016-03-16 LAB — CBC WITH DIFFERENTIAL (CANCER CENTER ONLY)
BASO#: 0 10*3/uL (ref 0.0–0.2)
BASO%: 0.4 % (ref 0.0–2.0)
EOS%: 4.3 % (ref 0.0–7.0)
Eosinophils Absolute: 0.1 10*3/uL (ref 0.0–0.5)
HCT: 32.8 % — ABNORMAL LOW (ref 38.7–49.9)
HEMOGLOBIN: 11.4 g/dL — AB (ref 13.0–17.1)
LYMPH#: 1.1 10*3/uL (ref 0.9–3.3)
LYMPH%: 38.8 % (ref 14.0–48.0)
MCH: 32.8 pg (ref 28.0–33.4)
MCHC: 34.8 g/dL (ref 32.0–35.9)
MCV: 94 fL (ref 82–98)
MONO#: 0.2 10*3/uL (ref 0.1–0.9)
MONO%: 5.3 % (ref 0.0–13.0)
NEUT%: 51.2 % (ref 40.0–80.0)
NEUTROS ABS: 1.4 10*3/uL — AB (ref 1.5–6.5)
RBC: 3.48 10*6/uL — AB (ref 4.20–5.70)
RDW: 12.7 % (ref 11.1–15.7)
WBC: 2.8 10*3/uL — AB (ref 4.0–10.0)

## 2016-03-16 LAB — CHCC SATELLITE - SMEAR

## 2016-03-16 NOTE — Progress Notes (Signed)
Referral MD  Reason for Referral: Chronic neutropenia   No chief complaint on file. : I white blood cell count has been low for several years.  HPI: Trevor Cooke is a very nice 75 year old white male. He is originally from [place]. He was a [place] area did  He is followed by Dr. Roma Schanz.  She has noted that he has had some neutropenia for several years area did he actually was referred out to [facility]. He has seen a hematologist at [facility] back to June 2015, his white cell count is 2.9. Prior to this, his white count was normal back in February 2011.  His hematologist at [facility] has just follow him along. He did recommend a bone marrow biopsy done but when this was about be scheduled, Trevor Cooke's white cell count improved.  It is becoming more difficult for him to go to [place]. As such, he was kindly referred to the [facility] for an evaluation.  He's had no problems with infections. He does have psoriasis. He is on no medication for this.  In October the sugar, a CBC showed a white cell count of 2.5. Hemoglobin 12.5. Platelet count 182,000. His MCV was 94. He had a normal white cell differential.  A couple weeks ago, his white cell count was 2.1. Hemoglobin 12.1. Platelet count 171,000. MCV was 95. Again, he had a normal white cell differential.  He does not smoke. He has occasional glass of wine.  He has no obvious occupational exposures.  There is no history of blood problems in the family.  He's had no foreign travel. He has had no weight loss or weight gain. He's had no change in bowel or bladder habits. He is up-to-date with his colonoscopy. His PSA has been normal. His last PSA was back in June 2015. The PSA was 0.45.  He's had no rashes. He's not noted any palpable lymph glands.  Overall, his performance status is ECOG 1.    Past Medical History:  Diagnosis Date  . CVA (cerebral  infarction) 1996  . Hyperlipidemia   . Hypertension   . Psoriasis   :  Past Surgical History:  Procedure Laterality Date  . CLEFT PALATE REPAIR  1940's   left   :   Current Outpatient Prescriptions:  .  aspirin 81 MG tablet, Take 81 mg by mouth daily., Disp: , Rfl:  .  Bioflavonoid Products (ESTER C PO), Take 500 mg by mouth daily., Disp: , Rfl:  .  calcium carbonate (OS-CAL) 600 MG TABS, Take 600 mg by mouth 2 (two) times daily with a meal., Disp: , Rfl:  .  Cholecalciferol (VITAMIN D) 2000 units CAPS, Take by mouth., Disp: , Rfl:  .  clobetasol ointment (TEMOVATE) 0.05 %, Apply topically 2 (two) times daily., Disp: 30 g, Rfl: 5 .  enalapril (VASOTEC) 10 MG tablet, Take 1 tablet (10 mg total) by mouth daily., Disp: 90 tablet, Rfl: 3 .  fluocinonide (LIDEX) 0.05 % external solution, Apply topically daily. Apply to scalp to treat affected areas, Disp: 60 mL, Rfl: 5 .  furosemide (LASIX) 20 MG tablet, Take 1 tablet (20 mg total) by mouth daily., Disp: 90 tablet, Rfl: 3 .  glucose blood (FREESTYLE PRECISION NEO TEST) test strip, Use as instructed to check blood sugar once a day. DX  E11.9, Disp: 50 each, Rfl: 5 .  labetalol (NORMODYNE) 100 MG tablet, Take 1 tablet (100 mg total) by mouth  2 (two) times daily., Disp: 180 tablet, Rfl: 3 .  levothyroxine (SYNTHROID, LEVOTHROID) 50 MCG tablet, Take 1 tablet (50 mcg total) by mouth daily., Disp: 90 tablet, Rfl: 3 .  lovastatin (MEVACOR) 40 MG tablet, Take 1 tablet (40 mg total) by mouth at bedtime., Disp: 90 tablet, Rfl: 3 .  Multiple Vitamins-Minerals (MULTIVITAMIN WITH MINERALS) tablet, Take by mouth daily., Disp: , Rfl:  .  NIFEdipine (PROCARDIA XL/ADALAT-CC) 60 MG 24 hr tablet, Take 1 tablet (60 mg total) by mouth daily., Disp: 90 tablet, Rfl: 3 .  Zinc 50 MG CAPS, Take by mouth., Disp: , Rfl: :  :  No Known Allergies:  Family History  Problem Relation Age of Onset  . Hypertension Mother   . Hypertension Father   . Stroke Father   .  Colon cancer Neg Hx   :  Social History   Social History  . Marital status: Married    Spouse name: N/A  . Number of children: N/A  . Years of education: N/A   Occupational History  . Not on file.   Social History Main Topics  . Smoking status: Former Research scientist (life sciences)  . Smokeless tobacco: Never Used  . Alcohol use 0.0 oz/week     Comment: 2- 12oz Miller Lite per week.    . Drug use: No  . Sexual activity: Yes   Other Topics Concern  . Not on file   Social History Narrative  . No narrative on file  :  Pertinent items are noted in HPI.  Exam: @IPVITALS @  Fairly well-developed and well-nourished white male in no obvious distress. Vital signs are temperature of 97.9. Pulse 62. Blood pressure 140/74. Weight is 192 pounds. Head and neck exam shows no ocular or oral lesions. He has no scleral icterus. There is no adenopathy in the neck. He has no oral lesions. His thyroid is nonpalpable. Lungs are clear to percussion ascultation bilaterally. Cardiac exam regular rate and rhythm with no murmurs, rubs or bruits. Abdomen is soft. He has good bowel sounds. There is no fluid wave. There is no palpable liver or spleen tip. Back exam shows no tenderness over the spine, ribs or hips. Extremities shows no clubbing cyanosis or edema. He has good range of motion of his joints. Skin exam shows isolated plaques of psoriasis. He has no ecchymoses. He has no petechia. Neurological exam shows no focal neurological deficits.    Recent Labs  03/16/16 1432  WBC 2.8*  HGB 11.4*  HCT 32.8*  PLT 178 Platelet count consistent in citrate   No results for input(s): NA, K, CL, CO2, GLUCOSE, BUN, CREATININE, CALCIUM in the last 72 hours.  Blood smear review:  Normochromic and normocytic population of red blood cells. There are no nucleated red blood cells. I see no teardrop cells. He has no inclusion bodies. He has no rouleau formation. There is no anisocytosis and poikilocytosis. There are no target cells. White  cells are mildly decreased in number. He has no immature myeloid or lymphoid forms. There are no blasts. I see no hypersegmented polys. I see no atypical lymphocytes. Platelets are adequate in number and size. Platelets are well granulated.  Pathology: None     Assessment and Plan:  Trevor Cooke is a 75 year old white male. He has chronic leukopenia. He clearly has had this for a couple years. He is pretty much asymptomatic with this.  I think a bone marrow biopsy would certainly would be helpful.  It is possible he may have low-grade  myelodysplasia.  I talked to him about this. He comes in with his wife. I explained to him how we do a bone marrow test. He would be interested in this. We will set this up for January 16.  Depending on what the bone marrow test shows, this will decide whether or not I send off a NGS panel for myelodysplasia. This may also provide some additional help.  He does not need any intervention right now. He is a symptomatic. Again, if he does have myelodysplasia, I would had to put him in a low risk category.  He has gotten great care at [facility]. I can understand why they would not elect to treat him or do anything invasive. He has been pretty much asymptomatic.  He really wants to be a little more aggressive. I told him that the bone marrow biopsy would be the best way for [place] to determine the degree of bone marrow dysfunction.  I spent about an hour with he and his wife. They're both very nice. It was fun talking with them. They are going to [place], [place] for Christmas to be with a son.  I will get him back into the office about 2 weeks after we do the bone marrow test.

## 2016-03-17 LAB — IRON AND TIBC
%SAT: 24 % (ref 20–55)
IRON: 66 ug/dL (ref 42–163)
TIBC: 271 ug/dL (ref 202–409)
UIBC: 205 ug/dL (ref 117–376)

## 2016-03-17 LAB — FERRITIN: FERRITIN: 213 ng/mL (ref 22–316)

## 2016-03-17 LAB — RETICULOCYTES: Reticulocyte Count: 1.2 % (ref 0.6–2.6)

## 2016-04-10 ENCOUNTER — Other Ambulatory Visit: Payer: Self-pay | Admitting: Hematology & Oncology

## 2016-04-10 DIAGNOSIS — D72819 Decreased white blood cell count, unspecified: Secondary | ICD-10-CM

## 2016-04-10 NOTE — Progress Notes (Unsigned)
Referral MD  Reason for Referral: Chronic neutropenia   No chief complaint on file. : I white blood cell count has been low for several years.  HPI: [name] is a very nice 76 year old white male. He is originally from [place]. He was a [place] area did  He is followed by Dr. Roma Schanz.  She has noted that he has had some neutropenia for several years area did he actually was referred out to [facility]. He has seen a hematologist at [facility] back to June 2015, his white cell count is 2.9. Prior to this, his white count was normal back in February 2011.  His hematologist at [facility] has just follow him along. He did recommend a bone marrow biopsy done but when this was about be scheduled, Mr. Horst's white cell count improved.  It is becoming more difficult for him to go to [place]. As such, he was kindly referred to the [facility] for an evaluation.  He's had no problems with infections. He does have psoriasis. He is on no medication for this.  In October the sugar, a CBC showed a white cell count of 2.5. Hemoglobin 12.5. Platelet count 182,000. His MCV was 94. He had a normal white cell differential.  A couple weeks ago, his white cell count was 2.1. Hemoglobin 12.1. Platelet count 171,000. MCV was 95. Again, he had a normal white cell differential.  He does not smoke. He has occasional glass of wine.  He has no obvious occupational exposures.  There is no history of blood problems in the family.  He's had no foreign travel. He has had no weight loss or weight gain. He's had no change in bowel or bladder habits. He is up-to-date with his colonoscopy. His PSA has been normal. His last PSA was back in June 2015. The PSA was 0.45.  He's had no rashes. He's not noted any palpable lymph glands.  Overall, his performance status is ECOG 1.     Past Medical History:  Diagnosis Date  . CVA (cerebral  infarction) 1996  . Hyperlipidemia   . Hypertension   . Psoriasis   :   Past Surgical History:  Procedure Laterality Date  . CLEFT PALATE REPAIR  1940's   left   :   Current Outpatient Prescriptions:  .  aspirin 81 MG tablet, Take 81 mg by mouth daily., Disp: , Rfl:  .  Bioflavonoid Products (ESTER C PO), Take 500 mg by mouth daily., Disp: , Rfl:  .  calcium carbonate (OS-CAL) 600 MG TABS, Take 600 mg by mouth 2 (two) times daily with a meal., Disp: , Rfl:  .  Cholecalciferol (VITAMIN D) 2000 units CAPS, Take by mouth., Disp: , Rfl:  .  clobetasol ointment (TEMOVATE) 0.05 %, Apply topically 2 (two) times daily., Disp: 30 g, Rfl: 5 .  enalapril (VASOTEC) 10 MG tablet, Take 1 tablet (10 mg total) by mouth daily., Disp: 90 tablet, Rfl: 3 .  fluocinonide (LIDEX) 0.05 % external solution, Apply topically daily. Apply to scalp to treat affected areas, Disp: 60 mL, Rfl: 5 .  furosemide (LASIX) 20 MG tablet, Take 1 tablet (20 mg total) by mouth daily., Disp: 90 tablet, Rfl: 3 .  glucose blood (FREESTYLE PRECISION NEO TEST) test strip, Use as instructed to check blood sugar once a day. DX  E11.9, Disp: 50 each, Rfl: 5 .  labetalol (NORMODYNE) 100 MG tablet, Take 1 tablet (100 mg total)  by mouth 2 (two) times daily., Disp: 180 tablet, Rfl: 3 .  levothyroxine (SYNTHROID, LEVOTHROID) 50 MCG tablet, Take 1 tablet (50 mcg total) by mouth daily., Disp: 90 tablet, Rfl: 3 .  lovastatin (MEVACOR) 40 MG tablet, Take 1 tablet (40 mg total) by mouth at bedtime., Disp: 90 tablet, Rfl: 3 .  Multiple Vitamins-Minerals (MULTIVITAMIN WITH MINERALS) tablet, Take by mouth daily., Disp: , Rfl:  .  NIFEdipine (PROCARDIA XL/ADALAT-CC) 60 MG 24 hr tablet, Take 1 tablet (60 mg total) by mouth daily., Disp: 90 tablet, Rfl: 3 .  Zinc 50 MG CAPS, Take by mouth., Disp: , Rfl: :  :  No Known Allergies:   Family History  Problem Relation Age of Onset  . Hypertension Mother   . Hypertension Father   . Stroke Father    . Colon cancer Neg Hx   :   Social History   Social History  . Marital status: Married    Spouse name: N/A  . Number of children: N/A  . Years of education: N/A   Occupational History  . Not on file.   Social History Main Topics  . Smoking status: Former Research scientist (life sciences)  . Smokeless tobacco: Never Used  . Alcohol use 0.0 oz/week     Comment: 2- 12oz Miller Lite per week.    . Drug use: No  . Sexual activity: Yes   Other Topics Concern  . Not on file   Social History Narrative  . No narrative on file  :  Pertinent items are noted in HPI.  Exam: @IPVITALS @  Fairly well-developed and well-nourished white male in no obvious distress. Vital signs are temperature of 97.9. Pulse 62. Blood pressure 140/74. Weight is 192 pounds. Head and neck exam shows no ocular or oral lesions. He has no scleral icterus. There is no adenopathy in the neck. He has no oral lesions. His thyroid is nonpalpable. Lungs are clear to percussion ascultation bilaterally. Cardiac exam regular rate and rhythm with no murmurs, rubs or bruits. Abdomen is soft. He has good bowel sounds. There is no fluid wave. There is no palpable liver or spleen tip. Back exam shows no tenderness over the spine, ribs or hips. Extremities shows no clubbing cyanosis or edema. He has good range of motion of his joints. Skin exam shows isolated plaques of psoriasis. He has no ecchymoses. He has no petechia. Neurological exam shows no focal neurological deficits.   No results for input(s): WBC, HGB, HCT, PLT in the last 72 hours. No results for input(s): NA, K, CL, CO2, GLUCOSE, BUN, CREATININE, CALCIUM in the last 72 hours.  Blood smear review:  Normochromic and normocytic population of red blood cells. There are no nucleated red blood cells. I see no teardrop cells. He has no inclusion bodies. He has no rouleau formation. There is no anisocytosis and poikilocytosis. There are no target cells. White cells are mildly decreased in number. He  has no immature myeloid or lymphoid forms. There are no blasts. I see no hypersegmented polys. I see no atypical lymphocytes. Platelets are adequate in number and size. Platelets are well granulated.  Pathology: None     Assessment and Plan:  Trevor Cooke is a 76 year old white male. He has chronic leukopenia. He clearly has had this for a couple years. He is pretty much asymptomatic with this.  I think a bone marrow biopsy would certainly would be helpful.  It is possible he may have low-grade myelodysplasia.  I talked to him about this.  He comes in with his wife. I explained to him how we do a bone marrow test. He would be interested in this. We will set this up for January 16.  Depending on what the bone marrow test shows, this will decide whether or not I send off a NGS panel for myelodysplasia. This may also provide some additional help.  He does not need any intervention right now. He is a symptomatic. Again, if he does have myelodysplasia, I would had to put him in a low risk category.  He has gotten great care at [facility]. I can understand why they would not elect to treat him or do anything invasive. He has been pretty much asymptomatic.  He really wants to be a little more aggressive. I told him that the bone marrow biopsy would be the best way for [place] to determine the degree of bone marrow dysfunction.  I spent about an hour with he and his wife. They're both very nice. It was fun talking with them. They are going to [place], [place] for Christmas to be with a son.  I will get him back into the office about 2 weeks after we do the bone marrow test.

## 2016-04-11 ENCOUNTER — Ambulatory Visit (HOSPITAL_COMMUNITY)
Admission: RE | Admit: 2016-04-11 | Discharge: 2016-04-11 | Disposition: A | Discharge status: Home / Self Care | Payer: Medicare Other | Source: Ambulatory Visit | Attending: Hematology & Oncology | Admitting: Hematology & Oncology

## 2016-04-11 DIAGNOSIS — D649 Anemia, unspecified: Secondary | ICD-10-CM | POA: Diagnosis not present

## 2016-04-11 DIAGNOSIS — D72819 Decreased white blood cell count, unspecified: Secondary | ICD-10-CM | POA: Diagnosis not present

## 2016-04-11 DIAGNOSIS — D7589 Other specified diseases of blood and blood-forming organs: Secondary | ICD-10-CM | POA: Diagnosis not present

## 2016-04-11 LAB — DIFFERENTIAL
Basophils Absolute: 0 10*3/uL (ref 0.0–0.1)
Basophils Relative: 0 %
Eosinophils Absolute: 0.1 10*3/uL (ref 0.0–0.7)
Eosinophils Relative: 6 %
LYMPHS PCT: 38 %
Lymphs Abs: 0.9 10*3/uL (ref 0.7–4.0)
MONO ABS: 0.2 10*3/uL (ref 0.1–1.0)
Monocytes Relative: 6 %
NEUTROS ABS: 1.2 10*3/uL — AB (ref 1.7–7.7)
NEUTROS PCT: 50 %

## 2016-04-11 LAB — CBC
HCT: 32.3 % — ABNORMAL LOW (ref 39.0–52.0)
Hemoglobin: 11.4 g/dL — ABNORMAL LOW (ref 13.0–17.0)
MCH: 31.8 pg (ref 26.0–34.0)
MCHC: 35.3 g/dL (ref 30.0–36.0)
MCV: 90.2 fL (ref 78.0–100.0)
PLATELETS: 168 10*3/uL (ref 150–400)
RBC: 3.58 MIL/uL — ABNORMAL LOW (ref 4.22–5.81)
RDW: 12.9 % (ref 11.5–15.5)
WBC: 2.4 10*3/uL — ABNORMAL LOW (ref 4.0–10.5)

## 2016-04-11 LAB — BONE MARROW EXAM

## 2016-04-11 MED ORDER — MIDAZOLAM HCL 5 MG/ML IJ SOLN
INTRAMUSCULAR | Status: AC
  Filled 2016-04-11: qty 1

## 2016-04-11 MED ORDER — MIDAZOLAM HCL 2 MG/2ML IJ SOLN
INTRAMUSCULAR | Status: AC | PRN
  Administered 2016-04-11: 2.5 mg via INTRAVENOUS

## 2016-04-11 MED ORDER — MIDAZOLAM HCL 10 MG/2ML IJ SOLN: 4.0000 mg | Freq: Once | INTRAMUSCULAR | Status: DC

## 2016-04-11 MED ORDER — MEPERIDINE HCL 25 MG/ML IJ SOLN
INTRAMUSCULAR | Status: AC | PRN
  Administered 2016-04-11: 12.5 mg via INTRAVENOUS

## 2016-04-11 MED ORDER — SODIUM CHLORIDE 0.9 % IV SOLN
Freq: Once | INTRAVENOUS | Status: AC
  Administered 2016-04-11: 07:00:00 via INTRAVENOUS

## 2016-04-11 MED ORDER — MEPERIDINE HCL 50 MG/ML IJ SOLN
25.0000 mg | Freq: Once | INTRAMUSCULAR | Status: DC
  Filled 2016-04-11: qty 1

## 2016-04-11 MED ORDER — MIDAZOLAM HCL 5 MG/ML IJ SOLN: 4.0000 mg | Freq: Once | INTRAMUSCULAR | Status: DC

## 2016-04-11 NOTE — Sedation Documentation (Signed)
Dressing CDI 

## 2016-04-11 NOTE — Sedation Documentation (Signed)
Medication dose calculated and verified for: Versed 2.5 mg IV and Demerol 12.5mg  IV

## 2016-04-11 NOTE — Discharge Instructions (Signed)
Do not drive  For 24 hours Do not go into public places today May resume your regular diet and take home medications as usual May experience small amount of tingling in leg (biopsy side) May take shower and remove bandage in am For any questions or concerns, call Dr Marin Olp If bleeding occurs at site, hold pressure x10 minutes  If continues, cal  Bone Marrow Aspiration and Bone Marrow Biopsy, Adult, Care After This sheet gives you information about how to care for yourself after your procedure. Your health care provider may also give you more specific instructions. If you have problems or questions, contact your health care provider. What can I expect after the procedure? After the procedure, it is common to have:  Mild pain and tenderness.  Swelling.  Bruising. Follow these instructions at home:  Take over-the-counter or prescription medicines only as told by your health care provider.  Do not take baths, swim, or use a hot tub until your health care provider approves. Ask if you can take a shower or have a sponge bath.  Follow instructions from your health care provider about how to take care of the puncture site. Make sure you:  Wash your hands with soap and water before you change your bandage (dressing). If soap and water are not available, use hand sanitizer.  Change your dressing as told by your health care provider.  Check your puncture siteevery day for signs of infection. Check for:  More redness, swelling, or pain.  More fluid or blood.  Warmth.  Pus or a bad smell.  Return to your normal activities as told by your health care provider. Ask your health care provider what activities are safe for you.  Do not drive for 24 hours if you were given a medicine to help you relax (sedative).  Keep all follow-up visits as told by your health care provider. This is important. Contact a health care provider if:  You have more redness, swelling, or pain around the  puncture site.  You have more fluid or blood coming from the puncture site.  Your puncture site feels warm to the touch.  You have pus or a bad smell coming from the puncture site.  You have a fever.  Your pain is not controlled with medicine. This information is not intended to replace advice given to you by your health care provider. Make sure you discuss any questions you have with your health care provider. Document Released: 09/30/2004 Document Revised: 10/01/2015 Document Reviewed: 08/25/2015 Elsevier Interactive Patient Education  2017 [place].  Moderate Conscious Sedation, Adult, Care After These instructions provide you with information about caring for yourself after your procedure. Your health care provider may also give you more specific instructions. Your treatment has been planned according to current medical practices, but problems sometimes occur. Call your health care provider if you have any problems or questions after your procedure. What can I expect after the procedure? After your procedure, it is common:  To feel sleepy for several hours.  To feel clumsy and have poor balance for several hours.  To have poor judgment for several hours.  To vomit if you eat too soon. Follow these instructions at home: For at least 24 hours after the procedure:   Do not:  Participate in activities where you could fall or become injured.  Drive.  Use heavy machinery.  Drink alcohol.  Take sleeping pills or medicines that cause drowsiness.  Make important decisions or sign legal documents.  Take care of children on your own.  Rest. Eating and drinking  Follow the diet recommended by your health care provider.  If you vomit:  Drink water, juice, or soup when you can drink without vomiting.  Make sure you have little or no nausea before eating solid foods. General instructions  Have a responsible adult stay with you until you are awake and alert.  Take  over-the-counter and prescription medicines only as told by your health care provider.  If you smoke, do not smoke without supervision.  Keep all follow-up visits as told by your health care provider. This is important. Contact a health care provider if:  You keep feeling nauseous or you keep vomiting.  You feel light-headed.  You develop a rash.  You have a fever. Get help right away if:  You have trouble breathing. This information is not intended to replace advice given to you by your health care provider. Make sure you discuss any questions you have with your health care provider. Document Released: 01/01/2013 Document Revised: 08/16/2015 Document Reviewed: 07/03/2015 Elsevier Interactive Patient Education  2017 [employer].

## 2016-04-11 NOTE — Procedures (Signed)
This is a bone marrow biopsy and aspirate procedure note for Trevor Cooke. He was brought to the short stay unit at [facility]. We did a bone marrow biopsy and aspirate on him for evaluation of leukopenia.  We did the appropriate timeout procedure at 7:55 AM.  His Mallimpati score was 2. His ASA class is 2.  He had IV placed peripherally without difficulty.  And then placed him onto his right side. He got a total of 2.5 mg of Versed and 12.5 mg of Demerol IV for sedation. This worked quite nicely.  The left posterior crest was prepped and draped in sterile fashion. 5 mL of 1% lidocaine was infiltrate under the skin down to the periosteum.  I then used a scalpel to make an incision into the skin. We obtained 2 excellent bone marrow aspirates. One was sent off for flow cytometry and cytogenetics.  I then used the combination Jamshidi needle to obtain an excellent bone marrow biopsy core.  He tolerated the procedure well. There were no complications. I cleaned and dressed the procedure site sterilely.  I got his wife. I told her that we would call them Thursday or Friday with the results.  As always, I got outstanding help from Marcie Bal the short stay nurse and Butch Penny of the lab tech. This is somewhat a bittersweet procedure day as this will be Janet's last procedure with me before she retires.  Lattie Haw, MD

## 2016-04-11 NOTE — Sedation Documentation (Signed)
Family updated as to patient's status.

## 2016-04-11 NOTE — Sedation Documentation (Signed)
Dressing remains CDI on post iliac area

## 2016-04-11 NOTE — Sedation Documentation (Signed)
Procedure ends dressing applied to post iliac with hypafix and gauze. Pt placed supine with towel to site for pressure

## 2016-04-12 DIAGNOSIS — D7589 Other specified diseases of blood and blood-forming organs: Secondary | ICD-10-CM | POA: Diagnosis not present

## 2016-04-18 ENCOUNTER — Encounter: Payer: Self-pay | Admitting: Hematology & Oncology

## 2016-04-18 LAB — CHROMOSOME ANALYSIS, BONE MARROW

## 2016-04-18 LAB — TISSUE HYBRIDIZATION (BONE MARROW)-NCBH

## 2016-04-18 NOTE — Telephone Encounter (Signed)
Enter error. LB 

## 2016-04-24 ENCOUNTER — Encounter (HOSPITAL_COMMUNITY): Payer: Self-pay

## 2016-04-25 ENCOUNTER — Encounter: Payer: Self-pay | Admitting: Hematology & Oncology

## 2016-04-26 ENCOUNTER — Encounter: Payer: Self-pay | Admitting: Hematology & Oncology

## 2016-04-26 ENCOUNTER — Ambulatory Visit (HOSPITAL_BASED_OUTPATIENT_CLINIC_OR_DEPARTMENT_OTHER): Payer: Medicare Other | Admitting: Hematology & Oncology

## 2016-04-26 ENCOUNTER — Other Ambulatory Visit (HOSPITAL_BASED_OUTPATIENT_CLINIC_OR_DEPARTMENT_OTHER): Payer: Medicare Other

## 2016-04-26 VITALS — BP 128/72 | HR 57 | Temp 97.4°F | Wt 189.2 lb

## 2016-04-26 DIAGNOSIS — D4620 Refractory anemia with excess of blasts, unspecified: Secondary | ICD-10-CM | POA: Diagnosis not present

## 2016-04-26 DIAGNOSIS — L409 Psoriasis, unspecified: Secondary | ICD-10-CM

## 2016-04-26 DIAGNOSIS — D72819 Decreased white blood cell count, unspecified: Secondary | ICD-10-CM

## 2016-04-26 DIAGNOSIS — D708 Other neutropenia: Secondary | ICD-10-CM

## 2016-04-26 DIAGNOSIS — D46Z Other myelodysplastic syndromes: Secondary | ICD-10-CM

## 2016-04-26 HISTORY — DX: Other myelodysplastic syndromes: D46.Z

## 2016-04-26 HISTORY — DX: Refractory anemia with excess of blasts, unspecified: D46.20

## 2016-04-26 LAB — CMP (CANCER CENTER ONLY)
ALBUMIN: 3.7 g/dL (ref 3.3–5.5)
ALK PHOS: 99 U/L — AB (ref 26–84)
ALT(SGPT): 22 U/L (ref 10–47)
AST: 27 U/L (ref 11–38)
BUN, Bld: 13 mg/dL (ref 7–22)
CHLORIDE: 98 meq/L (ref 98–108)
CO2: 18 mEq/L (ref 18–33)
CREATININE: 1.2 mg/dL (ref 0.6–1.2)
Calcium: 9.1 mg/dL (ref 8.0–10.3)
Glucose, Bld: 93 mg/dL (ref 73–118)
POTASSIUM: 3.9 meq/L (ref 3.3–4.7)
Sodium: 144 mEq/L (ref 128–145)
TOTAL PROTEIN: 7.2 g/dL (ref 6.4–8.1)
Total Bilirubin: 0.6 mg/dl (ref 0.20–1.60)

## 2016-04-26 LAB — CBC WITH DIFFERENTIAL (CANCER CENTER ONLY)
BASO#: 0 10*3/uL (ref 0.0–0.2)
BASO%: 0.4 % (ref 0.0–2.0)
EOS ABS: 0.1 10*3/uL (ref 0.0–0.5)
EOS%: 5.3 % (ref 0.0–7.0)
HCT: 35.7 % — ABNORMAL LOW (ref 38.7–49.9)
HEMOGLOBIN: 12.2 g/dL — AB (ref 13.0–17.1)
LYMPH#: 0.7 10*3/uL — AB (ref 0.9–3.3)
LYMPH%: 28 % (ref 14.0–48.0)
MCH: 32.4 pg (ref 28.0–33.4)
MCHC: 34.2 g/dL (ref 32.0–35.9)
MCV: 95 fL (ref 82–98)
MONO#: 0.2 10*3/uL (ref 0.1–0.9)
MONO%: 6.1 % (ref 0.0–13.0)
NEUT%: 60.2 % (ref 40.0–80.0)
NEUTROS ABS: 1.6 10*3/uL (ref 1.5–6.5)
Platelets: 176 10*3/uL (ref 145–400)
RBC: 3.76 10*6/uL — AB (ref 4.20–5.70)
RDW: 12.6 % (ref 11.1–15.7)
WBC: 2.6 10*3/uL — AB (ref 4.0–10.0)

## 2016-04-26 LAB — LACTATE DEHYDROGENASE: LDH: 248 U/L — AB (ref 125–245)

## 2016-04-26 NOTE — Progress Notes (Signed)
Hematology and Oncology Follow Up Visit  Trevor Cooke [id number] 1940/04/23 76 y.o. 04/26/2016   Principle Diagnosis:   Myelodysplastic syndrome-low-grade (IPSS-R score = 1)  Current Therapy:    Observation     Interim History:  Trevor Cooke is back for follow-up. We did go ahead and do a bone marrow biopsy on him. This is done on January 16. The bone marrow report ([id number]) showed a slightly hypercellular marrow with dyspoietic features. There was no increase in blasts. It was felt that this represented a myelodysplastic state.  The cytogenetics were all normal. Flow cytometry showed no increase in blasts.  Trevor Cooke has a low-grade myelodysplastic state. As such, I think we can follow this along.  We have not gotten the myelodysplastic panel on him yet. We've not gotten erythropoietin levels on him.  Iron studies showed ferritin of 213 with iron saturation of 24%.\ Trevor Cooke feels okay. Trevor Cooke's had a problem with infections. Trevor Cooke does have little psoriasis. I suppose of the psoriasis may be a part contributor to this myelodysplastic process. Trevor Cooke is not on any systemic medications for the psoriasis.  Trevor Cooke's had no fever. Trevor Cooke's had no change in bowel or bladder habits. Trevor Cooke's had no cough.  Overall, Trevor performance status is ECOG 1.  Medications:  Current Outpatient Prescriptions:  .  selenium 50 MCG TABS tablet, Take 200 mcg by mouth daily., Disp: , Rfl:  .  aspirin 81 MG tablet, Take 81 mg by mouth daily., Disp: , Rfl:  .  Bioflavonoid Products (ESTER C PO), Take 500 mg by mouth daily., Disp: , Rfl:  .  calcium carbonate (OS-CAL) 600 MG TABS, Take 600 mg by mouth 2 (two) times daily with a meal., Disp: , Rfl:  .  Cholecalciferol (VITAMIN D) 2000 units CAPS, Take by mouth., Disp: , Rfl:  .  clobetasol ointment (TEMOVATE) 0.05 %, Apply topically 2 (two) times daily., Disp: 30 g, Rfl: 5 .  enalapril (VASOTEC) 10 MG tablet, Take 1 tablet (10 mg total) by mouth daily., Disp: 90 tablet, Rfl: 3 .   fluocinonide (LIDEX) 0.05 % external solution, Apply topically daily. Apply to scalp to treat affected areas, Disp: 60 mL, Rfl: 5 .  furosemide (LASIX) 20 MG tablet, Take 1 tablet (20 mg total) by mouth daily., Disp: 90 tablet, Rfl: 3 .  glucose blood (FREESTYLE PRECISION NEO TEST) test strip, Use as instructed to check blood sugar once a day. DX  E11.9, Disp: 50 each, Rfl: 5 .  labetalol (NORMODYNE) 100 MG tablet, Take 1 tablet (100 mg total) by mouth 2 (two) times daily., Disp: 180 tablet, Rfl: 3 .  levothyroxine (SYNTHROID, LEVOTHROID) 50 MCG tablet, Take 1 tablet (50 mcg total) by mouth daily., Disp: 90 tablet, Rfl: 3 .  lovastatin (MEVACOR) 40 MG tablet, Take 1 tablet (40 mg total) by mouth at bedtime., Disp: 90 tablet, Rfl: 3 .  Multiple Vitamins-Minerals (MULTIVITAMIN WITH MINERALS) tablet, Take by mouth daily., Disp: , Rfl:  .  NIFEdipine (PROCARDIA XL/ADALAT-CC) 60 MG 24 hr tablet, Take 1 tablet (60 mg total) by mouth daily., Disp: 90 tablet, Rfl: 3 .  Zinc 50 MG CAPS, Take by mouth., Disp: , Rfl:   Allergies: No Known Allergies  Past Medical History, Surgical history, Social history, and Family History were reviewed and updated.  Review of Systems: As above  Physical Exam:  weight is 189 lb 4 oz (85.8 kg). Trevor oral temperature is 97.4 F (36.3 C). Trevor blood pressure is 128/72 and Trevor pulse is  57 (abnormal).   Wt Readings from Last 3 Encounters:  04/26/16 189 lb 4 oz (85.8 kg)  04/11/16 180 lb (81.6 kg)  03/16/16 192 lb (87.1 kg)     Head and neck exam shows no ocular or oral lesions. Trevor Cooke has no scleral icterus. There is no adenopathy in the neck. Trevor Cooke has no oral lesions. Trevor thyroid is nonpalpable. Lungs are clear to percussion ascultation bilaterally. Cardiac exam regular rate and rhythm with no murmurs, rubs or bruits. Abdomen is soft. Trevor Cooke has good bowel sounds. There is no fluid wave. There is no palpable liver or spleen tip. Back exam shows no tenderness over the spine, ribs or  hips. Extremities shows no clubbing cyanosis or edema. Trevor Cooke has good range of motion of Trevor joints. Skin exam shows isolated plaques of psoriasis. Trevor Cooke has no ecchymoses. Trevor Cooke has no petechia. Neurological exam shows no focal neurological deficits.   Lab Results  Component Value Date   WBC 2.6 (L) 04/26/2016   HGB 12.2 (L) 04/26/2016   HCT 35.7 (L) 04/26/2016   MCV 95 04/26/2016   PLT 176 04/26/2016     Chemistry      Component Value Date/Time   NA 144 04/26/2016 1117   K 3.9 04/26/2016 1117   CL 98 04/26/2016 1117   CO2 18 04/26/2016 1117   BUN 13 04/26/2016 1117   CREATININE 1.2 04/26/2016 1117      Component Value Date/Time   CALCIUM 9.1 04/26/2016 1117   ALKPHOS 99 (H) 04/26/2016 1117   AST 27 04/26/2016 1117   ALT 22 04/26/2016 1117   BILITOT 0.60 04/26/2016 1117         Impression and Plan: Trevor Cooke is a 76 year old white male. Trevor Cooke has myelodysplasia. This is manifested as leukopenia. Trevor ANC is stable.  I spent about half hour with Trevor Cooke and Trevor Cooke. I reviewed the bone marrow report with them. I told him that I just do not see indication that we had to do any therapy on him. Trevor IPSS-R is 1, only because Trevor Cooke has normal cytogenetics. This should put him in a very low risk group which should give him a overall survival of about 9 years. In that amount of time, something else could easily happen to him.   I will plan to see him back in 2 months. If all looks stable in 2 months, and we go to 3 month intervals.     Volanda Napoleon, MD 1/31/201812:30 PM

## 2016-07-05 ENCOUNTER — Ambulatory Visit (HOSPITAL_BASED_OUTPATIENT_CLINIC_OR_DEPARTMENT_OTHER): Payer: Medicare Other | Admitting: Hematology & Oncology

## 2016-07-05 ENCOUNTER — Other Ambulatory Visit (HOSPITAL_BASED_OUTPATIENT_CLINIC_OR_DEPARTMENT_OTHER): Payer: Medicare Other

## 2016-07-05 VITALS — BP 112/69 | HR 54 | Temp 97.8°F | Resp 16 | Wt 190.0 lb

## 2016-07-05 DIAGNOSIS — D46Z Other myelodysplastic syndromes: Secondary | ICD-10-CM

## 2016-07-05 DIAGNOSIS — D4620 Refractory anemia with excess of blasts, unspecified: Secondary | ICD-10-CM

## 2016-07-05 LAB — CBC WITH DIFFERENTIAL (CANCER CENTER ONLY)
BASO#: 0 10*3/uL (ref 0.0–0.2)
BASO%: 0.5 % (ref 0.0–2.0)
EOS ABS: 0.1 10*3/uL (ref 0.0–0.5)
EOS%: 5 % (ref 0.0–7.0)
HCT: 34.6 % — ABNORMAL LOW (ref 38.7–49.9)
HGB: 11.7 g/dL — ABNORMAL LOW (ref 13.0–17.1)
LYMPH#: 1 10*3/uL (ref 0.9–3.3)
LYMPH%: 45.4 % (ref 14.0–48.0)
MCH: 32.7 pg (ref 28.0–33.4)
MCHC: 33.8 g/dL (ref 32.0–35.9)
MCV: 97 fL (ref 82–98)
MONO#: 0.1 10*3/uL (ref 0.1–0.9)
MONO%: 5.5 % (ref 0.0–13.0)
NEUT#: 1 10*3/uL — ABNORMAL LOW (ref 1.5–6.5)
NEUT%: 43.6 % (ref 40.0–80.0)
PLATELETS: 166 10*3/uL (ref 145–400)
RBC: 3.58 10*6/uL — AB (ref 4.20–5.70)
RDW: 13.1 % (ref 11.1–15.7)
WBC: 2.2 10*3/uL — ABNORMAL LOW (ref 4.0–10.0)

## 2016-07-05 NOTE — Progress Notes (Signed)
Hematology and Oncology Follow Up Visit  Trevor Cooke [id number] [birth year] 76 y.o. 07/05/2016   Principle Diagnosis:   Myelodysplastic syndrome-low-grade (IPSS-R score = 1)  Current Therapy:    Observation     Interim History:  Trevor Cooke is back for follow-up. We did go ahead and do a bone marrow biopsy on him. This is done on January 16. The bone marrow report ([id number]) showed a slightly hypercellular marrow with dyspoietic features. There was no increase in blasts. It was felt that this represented a myelodysplastic state.  The cytogenetics were all normal. Flow cytometry showed no increase in blasts.  He has a low-grade myelodysplastic state.  He's had no problems with bleeding. He's had no infections. He's had no fever.  He has not noted any rashes.  His wife says that he's not sleeping as well as he used to.   Of note, iron studies that were done back in December showed a ferritin of 213 with iron saturation of 24%.  Overall, his performance status is ECOG 1.  Medications:  Current Outpatient Prescriptions:  .  aspirin 81 MG tablet, Take 81 mg by mouth daily., Disp: , Rfl:  .  Bioflavonoid Products (ESTER C PO), Take 500 mg by mouth daily., Disp: , Rfl:  .  calcium carbonate (OS-CAL) 600 MG TABS, Take 600 mg by mouth 2 (two) times daily with a meal., Disp: , Rfl:  .  Cholecalciferol (VITAMIN D) 2000 units CAPS, Take by mouth., Disp: , Rfl:  .  clobetasol ointment (TEMOVATE) 0.05 %, Apply topically 2 (two) times daily., Disp: 30 g, Rfl: 5 .  enalapril (VASOTEC) 10 MG tablet, Take 1 tablet (10 mg total) by mouth daily., Disp: 90 tablet, Rfl: 3 .  fluocinonide (LIDEX) 0.05 % external solution, Apply topically daily. Apply to scalp to treat affected areas, Disp: 60 mL, Rfl: 5 .  furosemide (LASIX) 20 MG tablet, Take 1 tablet (20 mg total) by mouth daily., Disp: 90 tablet, Rfl: 3 .  glucose blood (FREESTYLE PRECISION NEO TEST) test strip, Use as instructed to  check blood sugar once a day. DX  E11.9, Disp: 50 each, Rfl: 5 .  labetalol (NORMODYNE) 100 MG tablet, Take 1 tablet (100 mg total) by mouth 2 (two) times daily., Disp: 180 tablet, Rfl: 3 .  levothyroxine (SYNTHROID, LEVOTHROID) 50 MCG tablet, Take 1 tablet (50 mcg total) by mouth daily., Disp: 90 tablet, Rfl: 3 .  lovastatin (MEVACOR) 40 MG tablet, Take 1 tablet (40 mg total) by mouth at bedtime., Disp: 90 tablet, Rfl: 3 .  Multiple Vitamins-Minerals (MULTIVITAMIN WITH MINERALS) tablet, Take by mouth daily., Disp: , Rfl:  .  NIFEdipine (PROCARDIA XL/ADALAT-CC) 60 MG 24 hr tablet, Take 1 tablet (60 mg total) by mouth daily., Disp: 90 tablet, Rfl: 3 .  selenium 50 MCG TABS tablet, Take 200 mcg by mouth daily., Disp: , Rfl:  .  Zinc 50 MG CAPS, Take by mouth., Disp: , Rfl:   Allergies: No Known Allergies  Past Medical History, Surgical history, Social history, and Family History were reviewed and updated.  Review of Systems: As above  Physical Exam:  weight is 190 lb 0.6 oz (86.2 kg). His oral temperature is 97.8 F (36.6 C). His blood pressure is 112/69 and his pulse is 54 (abnormal). His respiration is 16 and oxygen saturation is 97%.   Wt Readings from Last 3 Encounters:  07/05/16 190 lb 0.6 oz (86.2 kg)  04/26/16 189 lb 4 oz (85.8 kg)  04/11/16  180 lb (81.6 kg)     Head and neck exam shows no ocular or oral lesions. He has no scleral icterus. There is no adenopathy in the neck. He has no oral lesions. His thyroid is nonpalpable. Lungs are clear to percussion ascultation bilaterally. Cardiac exam regular rate and rhythm with no murmurs, rubs or bruits. Abdomen is soft. He has good bowel sounds. There is no fluid wave. There is no palpable liver or spleen tip. Back exam shows no tenderness over the spine, ribs or hips. Extremities shows no clubbing cyanosis or edema. He has good range of motion of his joints. Skin exam shows isolated plaques of psoriasis. He has no ecchymoses. He has no  petechia. Neurological exam shows no focal neurological deficits.   Lab Results  Component Value Date   WBC 2.2 (L) 07/05/2016   HGB 11.7 (L) 07/05/2016   HCT 34.6 (L) 07/05/2016   MCV 97 07/05/2016   PLT 166 07/05/2016     Chemistry      Component Value Date/Time   NA 144 04/26/2016 1117   K 3.9 04/26/2016 1117   CL 98 04/26/2016 1117   CO2 18 04/26/2016 1117   BUN 13 04/26/2016 1117   CREATININE 1.2 04/26/2016 1117      Component Value Date/Time   CALCIUM 9.1 04/26/2016 1117   ALKPHOS 99 (H) 04/26/2016 1117   AST 27 04/26/2016 1117   ALT 22 04/26/2016 1117   BILITOT 0.60 04/26/2016 1117         Impression and Plan: Trevor Cooke is a 76 year old white male. He has myelodysplasia. This is manifested as leukopenia.   His and see is going down a little bit. He thinks this is one of his medications. I am not too sure about this. However, we will go ahead and see if we can't adjust his medications a little bit.  I told him to stop his enalapril. His blood pressure is doing pretty well so I do not think he needs his enalapril. He also wants to stop his Mevacor. This, I think his family doctor will have to talk to him about.  I will also need to send off his peripheral blood for NGS testing. It would not surprise me if he had some mutations.  I will see him back in 6 weeks. I think this is reasonable for follow-up.  If his neutrophil count continues to drop, then we will have to strongly consider therapy. I would consider either Vidaza or decitabine.  Both he and his wife understand what I am recommending. They agree to this.  I spent about 30 minutes with he and his wife. I was reviewing his lab work with him. I would has blood under the microscope. I do not see anything that looked suspicious for any type of progression to a hematologic malignancy.   Volanda Napoleon, MD 4/11/20184:53 PM

## 2016-07-06 LAB — RETICULOCYTES: RETICULOCYTE COUNT: 1 % (ref 0.6–2.6)

## 2016-07-06 LAB — IRON AND TIBC
%SAT: 20 % (ref 20–55)
Iron: 57 ug/dL (ref 42–163)
TIBC: 279 ug/dL (ref 202–409)
UIBC: 222 ug/dL (ref 117–376)

## 2016-07-06 LAB — ERYTHROPOIETIN: Erythropoietin: 13.9 m[IU]/mL (ref 2.6–18.5)

## 2016-07-06 LAB — FERRITIN: FERRITIN: 157 ng/mL (ref 22–316)

## 2016-07-07 ENCOUNTER — Telehealth: Payer: Self-pay | Admitting: Hematology & Oncology

## 2016-07-07 NOTE — Telephone Encounter (Signed)
Per order to sch follow up apt.  I called patient and the patient sch apt for 08/28/16

## 2016-07-18 ENCOUNTER — Ambulatory Visit (INDEPENDENT_AMBULATORY_CARE_PROVIDER_SITE_OTHER): Payer: Medicare Other | Admitting: Family Medicine

## 2016-07-18 ENCOUNTER — Encounter: Payer: Self-pay | Admitting: Family Medicine

## 2016-07-18 DIAGNOSIS — E039 Hypothyroidism, unspecified: Secondary | ICD-10-CM | POA: Diagnosis not present

## 2016-07-18 DIAGNOSIS — I10 Essential (primary) hypertension: Secondary | ICD-10-CM | POA: Diagnosis not present

## 2016-07-18 DIAGNOSIS — D4620 Refractory anemia with excess of blasts, unspecified: Secondary | ICD-10-CM | POA: Diagnosis not present

## 2016-07-18 DIAGNOSIS — E785 Hyperlipidemia, unspecified: Secondary | ICD-10-CM

## 2016-07-18 NOTE — Patient Instructions (Addendum)
Take half of the Vasotec 10 mg for 10 days, if blood pressure readings are good than come in for Nurse visit.  Hypertension Hypertension, commonly called high blood pressure, is when the force of blood pumping through the arteries is too strong. The arteries are the blood vessels that carry blood from the heart throughout the body. Hypertension forces the heart to work harder to pump blood and may cause arteries to become narrow or stiff. Having untreated or uncontrolled hypertension can cause heart attacks, strokes, kidney disease, and other problems. A blood pressure reading consists of a higher number over a lower number. Ideally, your blood pressure should be below 120/80. The first ("top") number is called the systolic pressure. It is a measure of the pressure in your arteries as your heart beats. The second ("bottom") number is called the diastolic pressure. It is a measure of the pressure in your arteries as the heart relaxes. What are the causes? The cause of this condition is not known. What increases the risk? Some risk factors for high blood pressure are under your control. Others are not. Factors you can change   Smoking.  Having type 2 diabetes mellitus, high cholesterol, or both.  Not getting enough exercise or physical activity.  Being overweight.  Having too much fat, sugar, calories, or salt (sodium) in your diet.  Drinking too much alcohol. Factors that are difficult or impossible to change   Having chronic kidney disease.  Having a family history of high blood pressure.  Age. Risk increases with age.  Race. You may be at higher risk if you are African-American.  Gender. Men are at higher risk than women before age 77. After age 66, women are at higher risk than men.  Having obstructive sleep apnea.  Stress. What are the signs or symptoms? Extremely high blood pressure (hypertensive crisis) may cause:  Headache.  Anxiety.  Shortness of  breath.  Nosebleed.  Nausea and vomiting.  Severe chest pain.  Jerky movements you cannot control (seizures). How is this diagnosed? This condition is diagnosed by measuring your blood pressure while you are seated, with your arm resting on a surface. The cuff of the blood pressure monitor will be placed directly against the skin of your upper arm at the level of your heart. It should be measured at least twice using the same arm. Certain conditions can cause a difference in blood pressure between your right and left arms. Certain factors can cause blood pressure readings to be lower or higher than normal (elevated) for a short period of time:  When your blood pressure is higher when you are in a health care provider's office than when you are at home, this is called white coat hypertension. Most people with this condition do not need medicines.  When your blood pressure is higher at home than when you are in a health care provider's office, this is called masked hypertension. Most people with this condition may need medicines to control blood pressure. If you have a high blood pressure reading during one visit or you have normal blood pressure with other risk factors:  You may be asked to return on a different day to have your blood pressure checked again.  You may be asked to monitor your blood pressure at home for 1 week or longer. If you are diagnosed with hypertension, you may have other blood or imaging tests to help your health care provider understand your overall risk for other conditions. How is this treated?  This condition is treated by making healthy lifestyle changes, such as eating healthy foods, exercising more, and reducing your alcohol intake. Your health care provider may prescribe medicine if lifestyle changes are not enough to get your blood pressure under control, and if:  Your systolic blood pressure is above 130.  Your diastolic blood pressure is above 80. Your  personal target blood pressure may vary depending on your medical conditions, your age, and other factors. Follow these instructions at home: Eating and drinking   Eat a diet that is high in fiber and potassium, and low in sodium, added sugar, and fat. An example eating plan is called the DASH (Dietary Approaches to Stop Hypertension) diet. To eat this way:  Eat plenty of fresh fruits and vegetables. Try to fill half of your plate at each meal with fruits and vegetables.  Eat whole grains, such as whole wheat pasta, brown rice, or whole grain bread. Fill about one quarter of your plate with whole grains.  Eat or drink low-fat dairy products, such as skim milk or low-fat yogurt.  Avoid fatty cuts of meat, processed or cured meats, and poultry with skin. Fill about one quarter of your plate with lean proteins, such as fish, chicken without skin, beans, eggs, and tofu.  Avoid premade and processed foods. These tend to be higher in sodium, added sugar, and fat.  Reduce your daily sodium intake. Most people with hypertension should eat less than 1,500 mg of sodium a day.  Limit alcohol intake to no more than 1 drink a day for nonpregnant women and 2 drinks a day for men. One drink equals 12 oz of beer, 5 oz of wine, or 1 oz of hard liquor. Lifestyle   Work with your health care provider to maintain a healthy body weight or to lose weight. Ask what an ideal weight is for you.  Get at least 30 minutes of exercise that causes your heart to beat faster (aerobic exercise) most days of the week. Activities may include walking, swimming, or biking.  Include exercise to strengthen your muscles (resistance exercise), such as pilates or lifting weights, as part of your weekly exercise routine. Try to do these types of exercises for 30 minutes at least 3 days a week.  Do not use any products that contain nicotine or tobacco, such as cigarettes and e-cigarettes. If you need help quitting, ask your health  care provider.  Monitor your blood pressure at home as told by your health care provider.  Keep all follow-up visits as told by your health care provider. This is important. Medicines   Take over-the-counter and prescription medicines only as told by your health care provider. Follow directions carefully. Blood pressure medicines must be taken as prescribed.  Do not skip doses of blood pressure medicine. Doing this puts you at risk for problems and can make the medicine less effective.  Ask your health care provider about side effects or reactions to medicines that you should watch for. Contact a health care provider if:  You think you are having a reaction to a medicine you are taking.  You have headaches that keep coming back (recurring).  You feel dizzy.  You have swelling in your ankles.  You have trouble with your vision. Get help right away if:  You develop a severe headache or confusion.  You have unusual weakness or numbness.  You feel faint.  You have severe pain in your chest or abdomen.  You vomit repeatedly.  You  have trouble breathing. Summary  Hypertension is when the force of blood pumping through your arteries is too strong. If this condition is not controlled, it may put you at risk for serious complications.  Your personal target blood pressure may vary depending on your medical conditions, your age, and other factors. For most people, a normal blood pressure is less than 120/80.  Hypertension is treated with lifestyle changes, medicines, or a combination of both. Lifestyle changes include weight loss, eating a healthy, low-sodium diet, exercising more, and limiting alcohol. This information is not intended to replace advice given to you by your health care provider. Make sure you discuss any questions you have with your health care provider. Document Released: 03/13/2005 Document Revised: 02/09/2016 Document Reviewed: 02/09/2016 Elsevier Interactive  Patient Education  2017 [employer]   Cholesterol Cholesterol is a white, waxy, fat-like substance that is needed by the human body in small amounts. The liver makes all the cholesterol we need. Cholesterol is carried from the liver by the blood through the blood vessels. Deposits of cholesterol (plaques) may build up on blood vessel (artery) walls. Plaques make the arteries narrower and stiffer. Cholesterol plaques increase the risk for heart attack and stroke. You cannot feel your cholesterol level even if it is very high. The only way to know that it is high is to have a blood test. Once you know your cholesterol levels, you should keep a record of the test results. Work with your health care provider to keep your levels in the desired range. What do the results mean?  Total cholesterol is a rough measure of all the cholesterol in your blood.  LDL (low-density lipoprotein) is the "bad" cholesterol. This is the type that causes plaque to build up on the artery walls. You want this level to be low.  HDL (high-density lipoprotein) is the "good" cholesterol because it cleans the arteries and carries the LDL away. You want this level to be high.  Triglycerides are fat that the body can either burn for energy or store. High levels are closely linked to heart disease. What are the desired levels of cholesterol?  Total cholesterol below 200.  LDL below 100 for people who are at risk, below 70 for people at very high risk.  HDL above 40 is good. A level of 60 or higher is considered to be protective against heart disease.  Triglycerides below 150. How can I lower my cholesterol? Diet  Follow your diet program as told by your health care provider.  Choose fish or white meat chicken and [place], roasted or baked. Limit fatty cuts of red meat, fried foods, and processed meats, such as sausage and lunch meats.  Eat lots of fresh fruits and vegetables.  Choose whole grains, beans, pasta,  potatoes, and cereals.  Choose olive oil, corn oil, or canola oil, and use only small amounts.  Avoid butter, mayonnaise, shortening, or palm kernel oils.  Avoid foods with trans fats.  Drink skim or nonfat milk and eat low-fat or nonfat yogurt and cheeses. Avoid whole milk, cream, ice cream, egg yolks, and full-fat cheeses.  Healthier desserts include angel food cake, ginger snaps, animal crackers, hard candy, popsicles, and low-fat or nonfat frozen yogurt. Avoid pastries, cakes, pies, and cookies. Exercise  Follow your exercise program as told by your health care provider. A regular program:  Helps to decrease LDL and raise HDL.  Helps with weight control.  Do things that increase your activity level, such as gardening, walking,  and taking the stairs.  Ask your health care provider about ways that you can be more active in your daily life. Medicine  Take over-the-counter and prescription medicines only as told by your health care provider.  Medicine may be prescribed by your health care provider to help lower cholesterol and decrease the risk for heart disease. This is usually done if diet and exercise have failed to bring down cholesterol levels.  If you have several risk factors, you may need medicine even if your levels are normal. This information is not intended to replace advice given to you by your health care provider. Make sure you discuss any questions you have with your health care provider. Document Released: 12/06/2000 Document Revised: 10/09/2015 Document Reviewed: 09/11/2015 Elsevier Interactive Patient Education  2017 [employer].

## 2016-07-18 NOTE — Assessment & Plan Note (Signed)
Cut dose vasotec 5 mg  Recheck 1-2 weeks

## 2016-07-18 NOTE — Assessment & Plan Note (Signed)
Tolerating statin, encouraged heart healthy diet, avoid trans fats, minimize simple carbs and saturated fats. Increase exercise as tolerated 

## 2016-07-18 NOTE — Assessment & Plan Note (Signed)
Per hematology Dr Marin Olp felt wbc affected by vasotec and bp has been good after weight loss Will dec dose of vasotec to 5 mg and recheck bp in 1-2 weeks

## 2016-07-18 NOTE — Progress Notes (Signed)
Subjective:  I acted as a Education administrator for Dr. Royden Purl, LPN    Patient ID: Trevor Cooke, male    DOB: 19-Feb-1941, 76 y.o.   MRN: [id number]  Chief Complaint  Patient presents with  . Hypertension    follow up  . Hyperlipidemia    follow up  . Diabetes    follow up    Hyperlipidemia  This is a chronic problem. The current episode started more than 1 year ago. Pertinent negatives include no chest pain, myalgias or shortness of breath.  Hypertension  This is a chronic problem. The current episode started more than 1 year ago. Pertinent negatives include no chest pain, headaches, malaise/fatigue, palpitations or shortness of breath.  Diabetes  He presents for his follow-up diabetic visit. He has type 2 diabetes mellitus. Pertinent negatives for hypoglycemia include no dizziness, headaches or nervousness/anxiousness. Pertinent negatives for diabetes include no chest pain and no weakness.    Patient is in today for follow up per Dr. Jorje Guild. Patient report Dr. Jonette Eva suggest he come off some of the medication is on, lasix, lovastatin, and vasotec due to bone marrow count. Patient report he stopped taking Lasix, and Lovastatin on his own on 07/08/16.   Patient Care Team: [name], DO as PCP - General (Family Medicine) Eduard Roux, MD (Gastroenterology) Turner Daniels, MD as Referring Physician (Internal Medicine) Hampton Abbot, MD as Referring Physician (Internal Medicine) Renita Papa, MD as Referring Physician (Dermatology) Reinaldo Berber III as Referring Physician (Optometry) Inda Castle, MD as Consulting Physician (Gastroenterology) Wallene Huh, DPM as Consulting Physician (Podiatry)   Past Medical History:  Diagnosis Date  . CVA (cerebral infarction) 1996  . Hyperlipidemia   . Hypertension   . MDS (myelodysplastic syndrome), low grade ([place]) 04/26/2016  . Psoriasis     Past Surgical History:  Procedure Laterality Date  . CLEFT PALATE REPAIR   1940's   left     Family History  Problem Relation Age of Onset  . Hypertension Mother   . Hypertension Father   . Stroke Father   . Colon cancer Neg Hx     Social History   Social History  . Marital status: Married    Spouse name: N/A  . Number of children: N/A  . Years of education: N/A   Occupational History  . Not on file.   Social History Main Topics  . Smoking status: Former Research scientist (life sciences)  . Smokeless tobacco: Never Used  . Alcohol use 0.0 oz/week     Comment: 2- 12oz Miller Lite per week.    . Drug use: No  . Sexual activity: Yes   Other Topics Concern  . Not on file   Social History Narrative  . No narrative on file    Outpatient Medications Prior to Visit  Medication Sig Dispense Refill  . aspirin 81 MG tablet Take 81 mg by mouth daily.    Marland Kitchen Bioflavonoid Products (ESTER C PO) Take 500 mg by mouth daily.    . calcium carbonate (OS-CAL) 600 MG TABS Take 600 mg by mouth 2 (two) times daily with a meal.    . Cholecalciferol (VITAMIN D) 2000 units CAPS Take by mouth.    . clobetasol ointment (TEMOVATE) 0.05 % Apply topically 2 (two) times daily. 30 g 5  . enalapril (VASOTEC) 10 MG tablet Take 1 tablet (10 mg total) by mouth daily. 90 tablet 3  . fluocinonide (LIDEX) 0.05 % external solution Apply topically daily.  Apply to scalp to treat affected areas 60 mL 5  . furosemide (LASIX) 20 MG tablet Take 1 tablet (20 mg total) by mouth daily. 90 tablet 3  . glucose blood (FREESTYLE PRECISION NEO TEST) test strip Use as instructed to check blood sugar once a day. DX  E11.9 50 each 5  . labetalol (NORMODYNE) 100 MG tablet Take 1 tablet (100 mg total) by mouth 2 (two) times daily. 180 tablet 3  . levothyroxine (SYNTHROID, LEVOTHROID) 50 MCG tablet Take 1 tablet (50 mcg total) by mouth daily. 90 tablet 3  . lovastatin (MEVACOR) 40 MG tablet Take 1 tablet (40 mg total) by mouth at bedtime. 90 tablet 3  . Multiple Vitamins-Minerals (MULTIVITAMIN WITH MINERALS) tablet Take by  mouth daily.    Marland Kitchen NIFEdipine (PROCARDIA XL/ADALAT-CC) 60 MG 24 hr tablet Take 1 tablet (60 mg total) by mouth daily. 90 tablet 3  . selenium 50 MCG TABS tablet Take 200 mcg by mouth daily.    . Zinc 50 MG CAPS Take by mouth.     No facility-administered medications prior to visit.     No Known Allergies  Review of Systems  Constitutional: Negative for chills, fever and malaise/fatigue.  HENT: Negative for congestion and hearing loss.   Eyes: Negative for discharge.  Respiratory: Negative for cough, sputum production and shortness of breath.   Cardiovascular: Negative for chest pain, palpitations and leg swelling.  Gastrointestinal: Negative for abdominal pain, blood in stool, constipation, diarrhea, heartburn, nausea and vomiting.  Genitourinary: Negative for dysuria, frequency, hematuria and urgency.  Musculoskeletal: Negative for back pain, falls and myalgias.  Skin: Negative for rash.  Neurological: Negative for dizziness, sensory change, loss of consciousness, weakness and headaches.  Endo/Heme/Allergies: Negative for environmental allergies. Does not bruise/bleed easily.  Psychiatric/Behavioral: Negative for depression and suicidal ideas. The patient is not nervous/anxious and does not have insomnia.        Objective:    Physical Exam  Constitutional: He is oriented to person, place, and time. Vital signs are normal. He appears well-developed and well-nourished. He is sleeping.  HENT:  Head: Normocephalic and atraumatic.  Mouth/Throat: Oropharynx is clear and moist.  Eyes: EOM are normal. Pupils are equal, round, and reactive to light.  Neck: Normal range of motion. Neck supple. No thyromegaly present.  Cardiovascular: Normal rate and regular rhythm.   No murmur heard. Pulmonary/Chest: Effort normal and breath sounds normal. No respiratory distress. He has no wheezes. He has no rales. He exhibits no tenderness.  Musculoskeletal: He exhibits no edema or tenderness.    Neurological: He is alert and oriented to person, place, and time.  Skin: Skin is warm and dry.  Psychiatric: He has a normal mood and affect. His behavior is normal. Judgment and thought content normal.  Nursing note and vitals reviewed.   BP 120/68 (BP Location: Right Arm, Patient Position: Sitting, Cuff Size: Normal)   Pulse (!) 57   Temp 97.8 F (36.6 C) (Oral)   Resp 16   Ht 5\' 8"  (1.727 m)   Wt 190 lb (86.2 kg)   SpO2 98%   BMI 28.89 kg/m  Wt Readings from Last 3 Encounters:  07/18/16 190 lb (86.2 kg)  07/05/16 190 lb 0.6 oz (86.2 kg)  04/26/16 189 lb 4 oz (85.8 kg)   BP Readings from Last 3 Encounters:  07/18/16 120/68  07/05/16 112/69  04/26/16 128/72     Immunization History  Administered Date(s) Administered  . H1N1 04/21/2008  . Influenza Split  01/30/2012  . Influenza Whole 12/28/2003, 01/02/2008, 12/29/2009  . Influenza, High Dose Seasonal PF 01/27/2013, 01/30/2014, 01/28/2015, 01/27/2016  . Pneumococcal Conjugate-13 04/07/2013  . Pneumococcal Polysaccharide-23 04/12/2010  . Td 09/25/1995  . Tdap 10/21/2010  . Zoster 01/30/2012    Health Maintenance  Topic Date Due  . FOOT EXAM  09/19/2014  . OPHTHALMOLOGY EXAM  08/25/2016  . HEMOGLOBIN A1C  08/31/2016  . INFLUENZA VACCINE  10/25/2016  . TETANUS/TDAP  10/20/2020  . PNA vac Low Risk Adult  Completed    Lab Results  Component Value Date   WBC 2.2 (L) 07/05/2016   HGB 11.7 (L) 07/05/2016   HCT 34.6 (L) 07/05/2016   PLT 166 07/05/2016   GLUCOSE 93 04/26/2016   CHOL 118 03/02/2016   TRIG 89.0 03/02/2016   HDL 41.70 03/02/2016   LDLCALC 58 03/02/2016   ALT 22 04/26/2016   AST 27 04/26/2016   NA 144 04/26/2016   K 3.9 04/26/2016   CL 98 04/26/2016   CREATININE 1.2 04/26/2016   BUN 13 04/26/2016   CO2 18 04/26/2016   TSH 1.27 03/02/2016   PSA 0.45 09/05/2013   HGBA1C 6.1 03/02/2016   MICROALBUR <0.7 08/27/2014    Lab Results  Component Value Date   TSH 1.27 03/02/2016   Lab Results   Component Value Date   WBC 2.2 (L) 07/05/2016   HGB 11.7 (L) 07/05/2016   HCT 34.6 (L) 07/05/2016   MCV 97 07/05/2016   PLT 166 07/05/2016   Lab Results  Component Value Date   NA 144 04/26/2016   K 3.9 04/26/2016   CO2 18 04/26/2016   GLUCOSE 93 04/26/2016   BUN 13 04/26/2016   CREATININE 1.2 04/26/2016   BILITOT 0.60 04/26/2016   ALKPHOS 99 (H) 04/26/2016   AST 27 04/26/2016   ALT 22 04/26/2016   PROT 7.2 04/26/2016   ALBUMIN 3.7 04/26/2016   CALCIUM 9.1 04/26/2016   GFR 72.01 03/02/2016   Lab Results  Component Value Date   CHOL 118 03/02/2016   Lab Results  Component Value Date   HDL 41.70 03/02/2016   Lab Results  Component Value Date   LDLCALC 58 03/02/2016   Lab Results  Component Value Date   TRIG 89.0 03/02/2016   Lab Results  Component Value Date   CHOLHDL 3 03/02/2016   Lab Results  Component Value Date   HGBA1C 6.1 03/02/2016         Assessment & Plan:   Problem List Items Addressed This Visit      Unprioritized   Essential hypertension    Cut dose vasotec 5 mg  Recheck 1-2 weeks      Hyperlipidemia    Tolerating statin, encouraged heart healthy diet, avoid trans fats, minimize simple carbs and saturated fats. Increase exercise as tolerated      Hypothyroidism   MDS (myelodysplastic syndrome), low grade ([facility]) (Chronic)    Per hematology Dr Marin Olp felt wbc affected by vasotec and bp has been good after weight loss Will dec dose of vasotec to 5 mg and recheck bp in 1-2 weeks          I am having Trevor Cooke maintain his aspirin, calcium carbonate, fluocinonide, clobetasol ointment, multivitamin with minerals, Zinc, Vitamin D, NIFEdipine, lovastatin, labetalol, furosemide, enalapril, Bioflavonoid Products (ESTER C PO), glucose blood, levothyroxine, and selenium.  No orders of the defined types were placed in this encounter.   CMA served as Education administrator during this visit. History, Physical and Plan performed  by medical provider.  Documentation and orders reviewed and attested to.  [name], DO   Patient ID: Trevor Cooke, male   DOB: 08-14-1940, 76 y.o.   MRN: [id number]

## 2016-07-20 LAB — MYLEDYSPLASTIC SYNDROME (MDS) PANEL

## 2016-07-21 ENCOUNTER — Encounter: Payer: Self-pay | Admitting: Hematology & Oncology

## 2016-07-25 ENCOUNTER — Encounter: Payer: Self-pay | Admitting: Hematology & Oncology

## 2016-07-28 ENCOUNTER — Ambulatory Visit: Payer: Medicare Other | Admitting: Family Medicine

## 2016-07-28 ENCOUNTER — Ambulatory Visit (INDEPENDENT_AMBULATORY_CARE_PROVIDER_SITE_OTHER): Payer: Medicare Other | Admitting: Family Medicine

## 2016-07-28 VITALS — BP 105/68 | HR 64

## 2016-07-28 DIAGNOSIS — I10 Essential (primary) hypertension: Secondary | ICD-10-CM

## 2016-07-28 NOTE — Progress Notes (Addendum)
Pre visit review using our clinic tool,if applicable. No additional management support is needed unless otherwise documented below in the visit note.   Patient returned with BP cuff from home.  BP= 121/73 P=59  Rechecked with office monitor  BP=111/64 P=56  Dr. [name] suggests patient continue medications as ordered and will change Vasotec to a different medication if necessary.   Patient did take all medications today including Vasotec 5 mg. States he will be stopping Vasotec because he feels it will shut down his bone marrow. States he will be stopping Nifedipine afterwards because he was advised by Dr. Jonette Eva to do so.  Advised patient to  Continue to monitor BP at home and to call office for appointment  If BP's become elevated or drop patient agreed.      [name], DO

## 2016-07-28 NOTE — Progress Notes (Signed)
Pre visit review using our clinic tool,if applicable. No additional management support is needed unless otherwise documented below in the visit note.   Patient in for BP check per order from Dr. Carollee Herter dated 07/18/16.  BP today = 105/68 P=64  Patient brought in BP readings,viewed by Dr. Carollee Herter   Per Dr. Charlett Blake patient needs to continue BP medications as is taking the Vasotec mg dosage along with Lasix 20 mg, Labetolol 100 mg and Nifedipine 60 mg. Patient has no complaints today but states he would like to stop taking the Vasotec because he has a blood condition. Patient to bring BP cuff in on next visit which he chooses to return today.    Per Dr. Carollee Herter based on BP readings patient needs to continue taking Vasotec because it is keeping his BP within normal range.

## 2016-08-18 ENCOUNTER — Other Ambulatory Visit: Payer: No Typology Code available for payment source

## 2016-08-18 ENCOUNTER — Ambulatory Visit: Payer: No Typology Code available for payment source | Admitting: Hematology & Oncology

## 2016-08-28 ENCOUNTER — Ambulatory Visit (HOSPITAL_BASED_OUTPATIENT_CLINIC_OR_DEPARTMENT_OTHER): Payer: Medicare Other | Admitting: Hematology & Oncology

## 2016-08-28 ENCOUNTER — Other Ambulatory Visit (HOSPITAL_BASED_OUTPATIENT_CLINIC_OR_DEPARTMENT_OTHER): Payer: Medicare Other

## 2016-08-28 VITALS — BP 138/71 | HR 54 | Temp 97.8°F | Resp 18 | Wt 189.5 lb

## 2016-08-28 DIAGNOSIS — D72819 Decreased white blood cell count, unspecified: Secondary | ICD-10-CM | POA: Diagnosis not present

## 2016-08-28 DIAGNOSIS — D4620 Refractory anemia with excess of blasts, unspecified: Secondary | ICD-10-CM

## 2016-08-28 LAB — CBC WITH DIFFERENTIAL (CANCER CENTER ONLY)
BASO#: 0 10*3/uL (ref 0.0–0.2)
BASO%: 0.4 % (ref 0.0–2.0)
EOS ABS: 0.1 10*3/uL (ref 0.0–0.5)
EOS%: 4.3 % (ref 0.0–7.0)
HCT: 36.1 % — ABNORMAL LOW (ref 38.7–49.9)
HGB: 12.4 g/dL — ABNORMAL LOW (ref 13.0–17.1)
LYMPH#: 1 10*3/uL (ref 0.9–3.3)
LYMPH%: 41.6 % (ref 14.0–48.0)
MCH: 32.7 pg (ref 28.0–33.4)
MCHC: 34.3 g/dL (ref 32.0–35.9)
MCV: 95 fL (ref 82–98)
MONO#: 0.1 10*3/uL (ref 0.1–0.9)
MONO%: 5.2 % (ref 0.0–13.0)
NEUT#: 1.1 10*3/uL — ABNORMAL LOW (ref 1.5–6.5)
NEUT%: 48.5 % (ref 40.0–80.0)
PLATELETS: 164 10*3/uL (ref 145–400)
RBC: 3.79 10*6/uL — AB (ref 4.20–5.70)
RDW: 13 % (ref 11.1–15.7)
WBC: 2.3 10*3/uL — ABNORMAL LOW (ref 4.0–10.0)

## 2016-08-28 LAB — IRON AND TIBC
%SAT: 25 % (ref 20–55)
Iron: 70 ug/dL (ref 42–163)
TIBC: 279 ug/dL (ref 202–409)
UIBC: 209 ug/dL (ref 117–376)

## 2016-08-28 LAB — FERRITIN: FERRITIN: 186 ng/mL (ref 22–316)

## 2016-08-28 LAB — RETICULOCYTES: Reticulocyte Count: 0.9 % (ref 0.6–2.6)

## 2016-08-28 LAB — CHCC SATELLITE - SMEAR

## 2016-08-28 NOTE — Progress Notes (Signed)
Hematology and Oncology Follow Up Visit  Trevor Cooke [id number] [birth year] 76 y.o. 08/28/2016   Principle Diagnosis:   Myelodysplastic syndrome-low-grade (IPSS-R score = 1) - tp53+  Current Therapy:    Observation     Interim History:  Trevor Cooke is back for follow-up. He and his wife are doing great. They just got back from [place]. There are now off to [facility]. There are making the "rounds" with the children and grandchildren.  He feels well. He's had no fever. He's had no nausea or vomiting. He's had no bleeding. He's had no cough or shortness of breath.  We did do NGS analysis. He is positive for a tp53 mutation. This does put him at higher risk.  He's had no change in bowel or bladder habits.  His appetite is good.  He's had no rashes.  Overall, his performance status is ECOG 1.  Medications:  Current Outpatient Prescriptions:  .  aspirin 81 MG tablet, Take 81 mg by mouth daily., Disp: , Rfl:  .  Cholecalciferol (VITAMIN D) 2000 units CAPS, Take by mouth., Disp: , Rfl:  .  enalapril (VASOTEC) 10 MG tablet, Take 1 tablet (10 mg total) by mouth daily., Disp: 90 tablet, Rfl: 3 .  fluocinonide (LIDEX) 0.05 % external solution, Apply topically daily. Apply to scalp to treat affected areas, Disp: 60 mL, Rfl: 5 .  glucose blood (FREESTYLE PRECISION NEO TEST) test strip, Use as instructed to check blood sugar once a day. DX  E11.9, Disp: 50 each, Rfl: 5 .  labetalol (NORMODYNE) 100 MG tablet, Take 1 tablet (100 mg total) by mouth 2 (two) times daily., Disp: 180 tablet, Rfl: 3 .  levothyroxine (SYNTHROID, LEVOTHROID) 50 MCG tablet, Take 1 tablet (50 mcg total) by mouth daily., Disp: 90 tablet, Rfl: 3 .  Multiple Vitamins-Minerals (MULTIVITAMIN WITH MINERALS) tablet, Take by mouth daily., Disp: , Rfl:  .  NIFEdipine (PROCARDIA XL/ADALAT-CC) 60 MG 24 hr tablet, Take 1 tablet (60 mg total) by mouth daily., Disp: 90 tablet, Rfl: 3 .  Zinc 50 MG CAPS, Take by mouth.,  Disp: , Rfl:   Allergies: No Known Allergies  Past Medical History, Surgical history, Social history, and Family History were reviewed and updated.  Review of Systems: As above  Physical Exam:  weight is 189 lb 8 oz (86 kg). His oral temperature is 97.8 F (36.6 C). His blood pressure is 138/71 and his pulse is 54 (abnormal). His respiration is 18 and oxygen saturation is 94%.   Wt Readings from Last 3 Encounters:  08/28/16 189 lb 8 oz (86 kg)  07/18/16 190 lb (86.2 kg)  07/05/16 190 lb 0.6 oz (86.2 kg)     Head and neck exam shows no ocular or oral lesions. He has no scleral icterus. There is no adenopathy in the neck. He has no oral lesions. His thyroid is nonpalpable. Lungs are clear to percussion ascultation bilaterally. Cardiac exam regular rate and rhythm with no murmurs, rubs or bruits. Abdomen is soft. He has good bowel sounds. There is no fluid wave. There is no palpable liver or spleen tip. Back exam shows no tenderness over the spine, ribs or hips. Extremities shows no clubbing cyanosis or edema. He has good range of motion of his joints. Skin exam shows isolated plaques of psoriasis. He has no ecchymoses. He has no petechia. Neurological exam shows no focal neurological deficits.   Lab Results  Component Value Date   WBC 2.3 (L) 08/28/2016   HGB  12.4 (L) 08/28/2016   HCT 36.1 (L) 08/28/2016   MCV 95 08/28/2016   PLT 164 08/28/2016     Chemistry      Component Value Date/Time   NA 144 04/26/2016 1117   K 3.9 04/26/2016 1117   CL 98 04/26/2016 1117   CO2 18 04/26/2016 1117   BUN 13 04/26/2016 1117   CREATININE 1.2 04/26/2016 1117      Component Value Date/Time   CALCIUM 9.1 04/26/2016 1117   ALKPHOS 99 (H) 04/26/2016 1117   AST 27 04/26/2016 1117   ALT 22 04/26/2016 1117   BILITOT 0.60 04/26/2016 1117         Impression and Plan: Trevor Cooke is a 76 year old white male. He has myelodysplasia. This is manifested as leukopenia.   Everything is stable. I  looked at his blood under the microscope. I do not see anything that looked suspicious for transformation to myeloid leukemia.  We will plan to get him back in 3 months now. His everything is holding steady, IV we can get him back in 3 months. I told him to make sure that he drinks a lot of water.  I also told him to wear sunscreen when he is outside in the yard.   Volanda Napoleon, MD 6/4/201812:03 PM

## 2016-09-04 ENCOUNTER — Ambulatory Visit (INDEPENDENT_AMBULATORY_CARE_PROVIDER_SITE_OTHER): Payer: Medicare Other | Admitting: Family Medicine

## 2016-09-04 ENCOUNTER — Encounter: Payer: Self-pay | Admitting: Family Medicine

## 2016-09-04 VITALS — BP 138/80 | HR 46 | Temp 97.4°F | Resp 16 | Ht 68.0 in | Wt 189.8 lb

## 2016-09-04 DIAGNOSIS — I10 Essential (primary) hypertension: Secondary | ICD-10-CM

## 2016-09-04 DIAGNOSIS — E1151 Type 2 diabetes mellitus with diabetic peripheral angiopathy without gangrene: Secondary | ICD-10-CM

## 2016-09-04 DIAGNOSIS — E1165 Type 2 diabetes mellitus with hyperglycemia: Secondary | ICD-10-CM | POA: Diagnosis not present

## 2016-09-04 DIAGNOSIS — IMO0002 Reserved for concepts with insufficient information to code with codable children: Secondary | ICD-10-CM

## 2016-09-04 DIAGNOSIS — E039 Hypothyroidism, unspecified: Secondary | ICD-10-CM | POA: Diagnosis not present

## 2016-09-04 DIAGNOSIS — E785 Hyperlipidemia, unspecified: Secondary | ICD-10-CM

## 2016-09-04 LAB — COMPREHENSIVE METABOLIC PANEL
ALK PHOS: 81 U/L (ref 39–117)
ALT: 20 U/L (ref 0–53)
AST: 22 U/L (ref 0–37)
Albumin: 4.2 g/dL (ref 3.5–5.2)
BUN: 20 mg/dL (ref 6–23)
CO2: 29 meq/L (ref 19–32)
Calcium: 9.5 mg/dL (ref 8.4–10.5)
Chloride: 102 mEq/L (ref 96–112)
Creatinine, Ser: 1 mg/dL (ref 0.40–1.50)
GFR: 76.91 mL/min (ref >60.00)
GLUCOSE: 100 mg/dL — AB (ref 70–99)
POTASSIUM: 3.8 meq/L (ref 3.5–5.1)
Sodium: 138 mEq/L (ref 135–145)
TOTAL PROTEIN: 7.1 g/dL (ref 6.0–8.3)
Total Bilirubin: 0.4 mg/dL (ref 0.2–1.2)

## 2016-09-04 LAB — LIPID PANEL
Cholesterol: 182 mg/dL (ref 0–200)
HDL: 39.2 mg/dL (ref >39.00)
LDL Cholesterol: 122 mg/dL — ABNORMAL HIGH (ref 0–99)
NONHDL: 142.94
Total CHOL/HDL Ratio: 5
Triglycerides: 105 mg/dL (ref 0.0–149.0)
VLDL: 21 mg/dL (ref 0.0–40.0)

## 2016-09-04 LAB — HEMOGLOBIN A1C: HEMOGLOBIN A1C: 6.2 % (ref 4.6–6.5)

## 2016-09-04 LAB — TSH: TSH: 4.57 u[IU]/mL — ABNORMAL HIGH (ref 0.35–4.50)

## 2016-09-04 MED ORDER — LEVOTHYROXINE SODIUM 50 MCG PO TABS: 50.0000 ug | ORAL_TABLET | Freq: Every day | ORAL | 3 refills | Status: DC

## 2016-09-04 MED ORDER — LABETALOL HCL 100 MG PO TABS: 100.0000 mg | ORAL_TABLET | Freq: Two times a day (BID) | ORAL | 3 refills | Status: DC

## 2016-09-04 MED ORDER — NIFEDIPINE ER OSMOTIC RELEASE 60 MG PO TB24: 60.0000 mg | ORAL_TABLET | Freq: Every day | ORAL | 3 refills | Status: DC

## 2016-09-04 MED ORDER — GLUCOSE BLOOD VI STRP: ORAL_STRIP | 3 refills | Status: DC

## 2016-09-04 NOTE — Patient Instructions (Signed)

## 2016-09-04 NOTE — Progress Notes (Signed)
Patient ID: Trevor Cooke, male   DOB: 1940/04/06, 76 y.o.   MRN: [id number]     Subjective:  I acted as a Education administrator for Dr. Carollee Cooke.  Trevor Cooke, [name]   Patient ID: Trevor Cooke, male    DOB: 04-28-1940, 76 y.o.   MRN: [id number]  Chief Complaint  Patient presents with  . Hypertension  . Diabetes  . Hyperlipidemia    HPI  Patient is in today for follow up blood pressure, thyroid, and cholesterol.  Yesterday sugar was 114 and the time before that was 86.  He has been doing well otherwise.  HPI HYPERTENSION   Blood pressure range-see home bp  Chest pain- no      Dyspnea- no Lightheadedness- no   Edema- no  Other side effects - no   Medication compliance: good Low salt diet- yes    DIABETES    Blood Sugar ranges-doing well per pt  Polyuria- no New Visual problems- no  Hypoglycemic symptoms- no  Other side effects-no Medication compliance - good Last eye exam- annually Foot exam- today   HYPERLIPIDEMIA  Medication compliance- good RUQ pain- no  Muscle aches- no Other side effects-no      Patient Care Team: [name], DO as PCP - General (Family Medicine) Eduard Roux, MD (Gastroenterology) Turner Daniels, MD as Referring Physician (Internal Medicine) Hampton Abbot, MD as Referring Physician (Internal Medicine) Renita Papa, MD as Referring Physician (Dermatology) Christiane Ha as Referring Physician (Optometry) Inda Castle, MD as Consulting Physician (Gastroenterology) Paulla Dolly Tamala Fothergill, DPM as Consulting Physician (Podiatry)   Past Medical History:  Diagnosis Date  . Arthritis    in neck  . CVA (cerebral infarction) 1996  . Hyperlipidemia   . Hypertension   . MDS (myelodysplastic syndrome), low grade ([place]) 04/26/2016  . Psoriasis     Past Surgical History:  Procedure Laterality Date  . CLEFT PALATE REPAIR  1940's   left     Family History  Problem Relation Age of Onset  . Hypertension Mother   .  Hypertension Father   . Stroke Father   . Colon cancer Neg Hx     Social History   Social History  . Marital status: Married    Spouse name: N/A  . Number of children: N/A  . Years of education: N/A   Occupational History  . Not on file.   Social History Main Topics  . Smoking status: Former Research scientist (life sciences)  . Smokeless tobacco: Never Used  . Alcohol use 0.0 oz/week     Comment: 2- 12oz Miller Lite per week.    . Drug use: No  . Sexual activity: Yes   Other Topics Concern  . Not on file   Social History Narrative  . No narrative on file    Outpatient Medications Prior to Visit  Medication Sig Dispense Refill  . aspirin 81 MG tablet Take 81 mg by mouth daily.    . Cholecalciferol (VITAMIN D) 2000 units CAPS Take by mouth.    . fluocinonide (LIDEX) 0.05 % external solution Apply topically daily. Apply to scalp to treat affected areas 60 mL 5  . Multiple Vitamins-Minerals (MULTIVITAMIN WITH MINERALS) tablet Take by mouth daily.    . Zinc 50 MG CAPS Take by mouth.    Marland Kitchen glucose blood (FREESTYLE PRECISION NEO TEST) test strip Use as instructed to check blood sugar once a day. DX  E11.9 50 each 5  . labetalol (NORMODYNE) 100 MG tablet Take  1 tablet (100 mg total) by mouth 2 (two) times daily. 180 tablet 3  . levothyroxine (SYNTHROID, LEVOTHROID) 50 MCG tablet Take 1 tablet (50 mcg total) by mouth daily. 90 tablet 3  . NIFEdipine (PROCARDIA XL/ADALAT-CC) 60 MG 24 hr tablet Take 1 tablet (60 mg total) by mouth daily. 90 tablet 3  . enalapril (VASOTEC) 10 MG tablet Take 1 tablet (10 mg total) by mouth daily. 90 tablet 3   No facility-administered medications prior to visit.     No Known Allergies  Review of Systems  Constitutional: Negative for fever and malaise/fatigue.  HENT: Negative for congestion.   Eyes: Negative for blurred vision.  Respiratory: Negative for cough and shortness of breath.   Cardiovascular: Negative for chest pain, palpitations and leg swelling.    Gastrointestinal: Negative for abdominal pain, blood in stool, nausea and vomiting.  Genitourinary: Negative for dysuria and frequency.  Musculoskeletal: Negative for back pain and falls.  Skin: Negative for rash.  Neurological: Negative for dizziness, loss of consciousness and headaches.  Endo/Heme/Allergies: Negative for environmental allergies.  Psychiatric/Behavioral: Negative for depression. The patient is not nervous/anxious.        Objective:    Physical Exam  Constitutional: He is oriented to person, place, and time. Vital signs are normal. He appears well-developed and well-nourished. He is sleeping. No distress.  HENT:  Head: Normocephalic and atraumatic.  Mouth/Throat: Oropharynx is clear and moist.  Eyes: Conjunctivae and EOM are normal. Pupils are equal, round, and reactive to light.  Neck: Normal range of motion. Neck supple. No thyromegaly present.  Cardiovascular: Normal rate and regular rhythm.   No murmur heard. Pulmonary/Chest: Effort normal and breath sounds normal. No respiratory distress. He has no wheezes. He has no rales. He exhibits no tenderness.  Abdominal: Soft. Bowel sounds are normal. There is no tenderness.  Musculoskeletal: Normal range of motion. He exhibits no edema, tenderness or deformity.  Neurological: He is alert and oriented to person, place, and time.  Skin: Skin is warm and dry. He is not diaphoretic.  Psychiatric: He has a normal mood and affect. His behavior is normal. Judgment and thought content normal.  Nursing note and vitals reviewed. Sensory exam of the foot is normal, tested with the monofilament. Good pulses, no lesions or ulcers, good peripheral pulses.  BP 138/80 (BP Location: Left Arm, Cuff Size: Normal)   Pulse (!) 46   Temp 97.4 F (36.3 C) (Oral)   Resp 16   Ht 5\' 8"  (1.727 m)   Wt 189 lb 12.8 oz (86.1 kg)   SpO2 96%   BMI 28.86 kg/m  Wt Readings from Last 3 Encounters:  09/04/16 189 lb 12.8 oz (86.1 kg)  08/28/16  189 lb 8 oz (86 kg)  07/18/16 190 lb (86.2 kg)   BP Readings from Last 3 Encounters:  09/04/16 138/80  08/28/16 138/71  07/28/16 105/68     Immunization History  Administered Date(s) Administered  . H1N1 04/21/2008  . Influenza Split 01/30/2012  . Influenza Whole 12/28/2003, 01/02/2008, 12/29/2009  . Influenza, High Dose Seasonal PF 01/27/2013, 01/30/2014, 01/28/2015, 01/27/2016  . Pneumococcal Conjugate-13 04/07/2013  . Pneumococcal Polysaccharide-23 04/12/2010  . Td 09/25/1995  . Tdap 10/21/2010  . Zoster 01/30/2012    Health Maintenance  Topic Date Due  . FOOT EXAM  09/19/2014  . OPHTHALMOLOGY EXAM  08/25/2016  . HEMOGLOBIN A1C  08/31/2016  . INFLUENZA VACCINE  10/25/2016  . TETANUS/TDAP  10/20/2020  . PNA vac Low Risk Adult  Completed  Lab Results  Component Value Date   WBC 2.3 (L) 08/28/2016   HGB 12.4 (L) 08/28/2016   HCT 36.1 (L) 08/28/2016   PLT 164 08/28/2016   GLUCOSE 100 (H) 09/04/2016   CHOL 182 09/04/2016   TRIG 105.0 09/04/2016   HDL 39.20 09/04/2016   LDLCALC 122 (H) 09/04/2016   ALT 20 09/04/2016   AST 22 09/04/2016   NA 138 09/04/2016   K 3.8 09/04/2016   CL 102 09/04/2016   CREATININE 1.00 09/04/2016   BUN 20 09/04/2016   CO2 29 09/04/2016   TSH 4.57 (H) 09/04/2016   PSA 0.45 09/05/2013   HGBA1C 6.2 09/04/2016   MICROALBUR <0.7 08/27/2014    Lab Results  Component Value Date   TSH 4.57 (H) 09/04/2016   Lab Results  Component Value Date   WBC 2.3 (L) 08/28/2016   HGB 12.4 (L) 08/28/2016   HCT 36.1 (L) 08/28/2016   MCV 95 08/28/2016   PLT 164 08/28/2016   Lab Results  Component Value Date   NA 138 09/04/2016   K 3.8 09/04/2016   CO2 29 09/04/2016   GLUCOSE 100 (H) 09/04/2016   BUN 20 09/04/2016   CREATININE 1.00 09/04/2016   BILITOT 0.4 09/04/2016   ALKPHOS 81 09/04/2016   AST 22 09/04/2016   ALT 20 09/04/2016   PROT 7.1 09/04/2016   ALBUMIN 4.2 09/04/2016   CALCIUM 9.5 09/04/2016   GFR 76.91 09/04/2016   Lab  Results  Component Value Date   CHOL 182 09/04/2016   Lab Results  Component Value Date   HDL 39.20 09/04/2016   Lab Results  Component Value Date   LDLCALC 122 (H) 09/04/2016   Lab Results  Component Value Date   TRIG 105.0 09/04/2016   Lab Results  Component Value Date   CHOLHDL 5 09/04/2016   Lab Results  Component Value Date   HGBA1C 6.2 09/04/2016         Assessment & Plan:   Problem List Items Addressed This Visit      Unprioritized   Essential hypertension - Primary   Relevant Medications   NIFEdipine (PROCARDIA XL/ADALAT-CC) 60 MG 24 hr tablet   labetalol (NORMODYNE) 100 MG tablet   Other Relevant Orders   Comprehensive metabolic panel (Completed)   Hyperlipidemia   Relevant Medications   NIFEdipine (PROCARDIA XL/ADALAT-CC) 60 MG 24 hr tablet   labetalol (NORMODYNE) 100 MG tablet   Other Relevant Orders   Comprehensive metabolic panel (Completed)   Lipid panel (Completed)   Hypothyroidism   Relevant Medications   labetalol (NORMODYNE) 100 MG tablet   levothyroxine (SYNTHROID, LEVOTHROID) 50 MCG tablet   Other Relevant Orders   TSH (Completed)    Other Visit Diagnoses    DM (diabetes mellitus) type II uncontrolled, periph vascular disorder (HCC)       Relevant Medications   NIFEdipine (PROCARDIA XL/ADALAT-CC) 60 MG 24 hr tablet   labetalol (NORMODYNE) 100 MG tablet   Other Relevant Orders   Hemoglobin A1c (Completed)   Comprehensive metabolic panel (Completed)      I have discontinued Mr. Seefeld's enalapril. I am also having him maintain his aspirin, fluocinonide, multivitamin with minerals, Zinc, Vitamin D, NIFEdipine, labetalol, levothyroxine, and glucose blood.  Meds ordered this encounter  Medications  . NIFEdipine (PROCARDIA XL/ADALAT-CC) 60 MG 24 hr tablet    Sig: Take 1 tablet (60 mg total) by mouth daily.    Dispense:  90 tablet    Refill:  3  . labetalol (NORMODYNE)  100 MG tablet    Sig: Take 1 tablet (100 mg total) by mouth 2  (two) times daily.    Dispense:  180 tablet    Refill:  3  . levothyroxine (SYNTHROID, LEVOTHROID) 50 MCG tablet    Sig: Take 1 tablet (50 mcg total) by mouth daily.    Dispense:  90 tablet    Refill:  3  . glucose blood (FREESTYLE PRECISION NEO TEST) test strip    Sig: Use as instructed to check blood sugar once a day. DX  E11.9    Dispense:  100 each    Refill:  3    CMA served as scribe during this visit. History, Physical and Plan performed by medical provider. Documentation and orders reviewed and attested to.  [name], DO

## 2016-09-07 MED ORDER — LOVASTATIN 20 MG PO TABS: 20.0000 mg | ORAL_TABLET | Freq: Every day | ORAL | 2 refills | Status: DC

## 2016-09-07 NOTE — Addendum Note (Signed)
Addended byDamita Dunnings D on: 09/07/2016 09:42 AM   Modules accepted: Orders

## 2016-09-08 LAB — MYELODYSPLASTIC SYNDROME (MDS) PANEL

## 2016-09-15 ENCOUNTER — Telehealth: Payer: Self-pay | Admitting: Family Medicine

## 2016-09-15 NOTE — Telephone Encounter (Signed)
Relation to pt: self  Call back [facility]: [place], [place] [phone] (Phone) [phone] (Fax)     Reason for call:  Patient requesting 90 day supply of enalapril (VASOTEC) 10 MG tablet

## 2016-09-18 NOTE — Telephone Encounter (Signed)
Left message on machine to call back  Dr. [name] discontinued medication at last visit.  Should not need.

## 2016-09-19 NOTE — Telephone Encounter (Signed)
Refill x3 months 1 refill

## 2016-09-19 NOTE — Telephone Encounter (Signed)
Left message for patient to call back  

## 2016-09-19 NOTE — Telephone Encounter (Signed)
Called patient and he stated that he was off of the enalapril for 1 week and not off of it.  It was discontinued.  Would you like to refill this?  Patient would like this sent to [facility]

## 2016-09-20 MED ORDER — ENALAPRIL MALEATE 10 MG PO TABS: 10.0000 mg | ORAL_TABLET | Freq: Every day | ORAL | 1 refills | Status: DC

## 2016-09-20 NOTE — Telephone Encounter (Signed)
Left message on machine and rx sent in. 

## 2016-09-20 NOTE — Addendum Note (Signed)
Addended by: Kem Boroughs D on: 09/20/2016 03:40 PM   Modules accepted: Orders

## 2016-10-04 DIAGNOSIS — H0289 Other specified disorders of eyelid: Secondary | ICD-10-CM | POA: Diagnosis not present

## 2016-10-04 DIAGNOSIS — H25813 Combined forms of age-related cataract, bilateral: Secondary | ICD-10-CM | POA: Diagnosis not present

## 2016-10-04 DIAGNOSIS — H353131 Nonexudative age-related macular degeneration, bilateral, early dry stage: Secondary | ICD-10-CM | POA: Diagnosis not present

## 2016-11-10 ENCOUNTER — Telehealth: Payer: Self-pay | Admitting: Family Medicine

## 2016-11-10 NOTE — Telephone Encounter (Signed)
Attempted to call pt to schedule AWV. Pt did not answer. Pt can schedule AWV after 03/02/2017. Will try to call pt at a later time.

## 2016-11-24 ENCOUNTER — Telehealth: Payer: Self-pay | Admitting: Family Medicine

## 2016-11-24 MED ORDER — LOVASTATIN 20 MG PO TABS: 20.0000 mg | ORAL_TABLET | Freq: Every day | ORAL | 2 refills | Status: DC

## 2016-11-24 NOTE — Telephone Encounter (Signed)
Received paper refill request. Refills were sent in.

## 2016-12-07 ENCOUNTER — Ambulatory Visit (HOSPITAL_BASED_OUTPATIENT_CLINIC_OR_DEPARTMENT_OTHER): Payer: Medicare Other | Admitting: Hematology & Oncology

## 2016-12-07 ENCOUNTER — Ambulatory Visit: Payer: No Typology Code available for payment source | Admitting: Family Medicine

## 2016-12-07 ENCOUNTER — Other Ambulatory Visit: Payer: Self-pay | Admitting: Family Medicine

## 2016-12-07 ENCOUNTER — Other Ambulatory Visit (HOSPITAL_BASED_OUTPATIENT_CLINIC_OR_DEPARTMENT_OTHER): Payer: Medicare Other

## 2016-12-07 ENCOUNTER — Encounter: Payer: Self-pay | Admitting: Family Medicine

## 2016-12-07 ENCOUNTER — Other Ambulatory Visit (INDEPENDENT_AMBULATORY_CARE_PROVIDER_SITE_OTHER): Payer: Medicare Other

## 2016-12-07 VITALS — BP 142/83 | HR 53 | Temp 97.6°F | Resp 20 | Wt 188.2 lb

## 2016-12-07 DIAGNOSIS — D4620 Refractory anemia with excess of blasts, unspecified: Secondary | ICD-10-CM

## 2016-12-07 DIAGNOSIS — E785 Hyperlipidemia, unspecified: Secondary | ICD-10-CM

## 2016-12-07 DIAGNOSIS — D72819 Decreased white blood cell count, unspecified: Secondary | ICD-10-CM | POA: Diagnosis not present

## 2016-12-07 LAB — CBC WITH DIFFERENTIAL (CANCER CENTER ONLY)
BASO#: 0 10*3/uL (ref 0.0–0.2)
BASO%: 0.5 % (ref 0.0–2.0)
EOS%: 4.3 % (ref 0.0–7.0)
Eosinophils Absolute: 0.1 10*3/uL (ref 0.0–0.5)
HCT: 36.2 % — ABNORMAL LOW (ref 38.7–49.9)
HGB: 12.5 g/dL — ABNORMAL LOW (ref 13.0–17.1)
LYMPH#: 0.8 10*3/uL — ABNORMAL LOW (ref 0.9–3.3)
LYMPH%: 39.8 % (ref 14.0–48.0)
MCH: 33 pg (ref 28.0–33.4)
MCHC: 34.5 g/dL (ref 32.0–35.9)
MCV: 96 fL (ref 82–98)
MONO#: 0.1 10*3/uL (ref 0.1–0.9)
MONO%: 4.7 % (ref 0.0–13.0)
NEUT#: 1.1 10*3/uL — ABNORMAL LOW (ref 1.5–6.5)
NEUT%: 50.7 % (ref 40.0–80.0)
RBC: 3.79 10*6/uL — AB (ref 4.20–5.70)
RDW: 13 % (ref 11.1–15.7)
WBC: 2.1 10*3/uL — AB (ref 4.0–10.0)

## 2016-12-07 LAB — COMPREHENSIVE METABOLIC PANEL
ALBUMIN: 4.4 g/dL (ref 3.5–5.2)
ALK PHOS: 91 U/L (ref 39–117)
ALT: 19 U/L (ref 0–53)
AST: 24 U/L (ref 0–37)
BUN: 15 mg/dL (ref 6–23)
CALCIUM: 9.5 mg/dL (ref 8.4–10.5)
CO2: 33 mEq/L — ABNORMAL HIGH (ref 19–32)
Chloride: 102 mEq/L (ref 96–112)
Creatinine, Ser: 1.11 mg/dL (ref 0.40–1.50)
GFR: 68.14 mL/min (ref >60.00)
Glucose, Bld: 104 mg/dL — ABNORMAL HIGH (ref 70–99)
Potassium: 4.2 mEq/L (ref 3.5–5.1)
SODIUM: 141 meq/L (ref 135–145)
TOTAL PROTEIN: 7.3 g/dL (ref 6.0–8.3)
Total Bilirubin: 0.5 mg/dL (ref 0.2–1.2)

## 2016-12-07 LAB — IRON AND TIBC
%SAT: 26 % (ref 20–55)
Iron: 74 ug/dL (ref 42–163)
TIBC: 280 ug/dL (ref 202–409)
UIBC: 206 ug/dL (ref 117–376)

## 2016-12-07 LAB — LIPID PANEL
Cholesterol: 132 mg/dL (ref 0–200)
HDL: 53 mg/dL (ref >39.00)
LDL Cholesterol: 59 mg/dL (ref 0–99)
NonHDL: 78.99
Total CHOL/HDL Ratio: 2
Triglycerides: 101 mg/dL (ref 0.0–149.0)
VLDL: 20.2 mg/dL (ref 0.0–40.0)

## 2016-12-07 LAB — FERRITIN: FERRITIN: 193 ng/mL (ref 22–316)

## 2016-12-07 LAB — RETICULOCYTES: Reticulocyte Count: 1.3 % (ref 0.6–2.6)

## 2016-12-07 NOTE — Progress Notes (Signed)
Hematology and Oncology Follow Up Visit  JAIYDEN LAUR [id number] 03/16/1941 76 y.o. 12/07/2016   Principle Diagnosis:   Myelodysplastic syndrome-low-grade (IPSS-R score = 1) - tp53+  Current Therapy:    Observation     Interim History:  Mr. Mankins is back for follow-up. He comes in with his wife. They have had a very busy summer. They've been to [facility]. They've been to [place]. They had grandchildren come over to visit.  He looks good. He feels good. He's had no problems with fever. He's had no bleeding. There's been no rashes. He's had no problems with weight loss or weight gain.  He's had no leg swelling. He's had no change in bowel or bladder habits.  His last iron studies back in June showed a ferritin of 186 with an iron saturation of 25%.  He's had no mouth sores.  Overall, his performance status is ECOG 1.   Medications:  Current Outpatient Prescriptions:  .  aspirin 81 MG tablet, Take 81 mg by mouth daily., Disp: , Rfl:  .  Cholecalciferol (VITAMIN D) 2000 units CAPS, Take by mouth., Disp: , Rfl:  .  enalapril (VASOTEC) 10 MG tablet, Take 1 tablet (10 mg total) by mouth daily., Disp: 90 tablet, Rfl: 1 .  fluocinonide (LIDEX) 0.05 % external solution, Apply topically daily. Apply to scalp to treat affected areas, Disp: 60 mL, Rfl: 5 .  glucose blood (FREESTYLE PRECISION NEO TEST) test strip, Use as instructed to check blood sugar once a day. DX  E11.9, Disp: 100 each, Rfl: 3 .  labetalol (NORMODYNE) 100 MG tablet, Take 1 tablet (100 mg total) by mouth 2 (two) times daily., Disp: 180 tablet, Rfl: 3 .  levothyroxine (SYNTHROID, LEVOTHROID) 50 MCG tablet, Take 1 tablet (50 mcg total) by mouth daily., Disp: 90 tablet, Rfl: 3 .  lovastatin (MEVACOR) 20 MG tablet, Take 1 tablet (20 mg total) by mouth at bedtime., Disp: 30 tablet, Rfl: 2 .  Multiple Vitamins-Minerals (MULTIVITAMIN WITH MINERALS) tablet, Take by mouth daily., Disp: , Rfl:  .  NIFEdipine  (PROCARDIA XL/ADALAT-CC) 60 MG 24 hr tablet, Take 1 tablet (60 mg total) by mouth daily., Disp: 90 tablet, Rfl: 3 .  Zinc 50 MG CAPS, Take by mouth., Disp: , Rfl:   Allergies: No Known Allergies  Past Medical History, Surgical history, Social history, and Family History were reviewed and updated.  Review of Systems: As stated in the interim history  Physical Exam:  weight is 188 lb 4 oz (85.4 kg). His oral temperature is 97.6 F (36.4 C). His blood pressure is 142/83 (abnormal) and his pulse is 53 (abnormal). His respiration is 20 and oxygen saturation is 99%.   Wt Readings from Last 3 Encounters:  12/07/16 188 lb 4 oz (85.4 kg)  09/04/16 189 lb 12.8 oz (86.1 kg)  08/28/16 189 lb 8 oz (86 kg)     Physical Exam  Constitutional: He is oriented to person, place, and time.  HENT:  Head: Normocephalic and atraumatic.  Mouth/Throat: Oropharynx is clear and moist.  Eyes: Pupils are equal, round, and reactive to light. EOM are normal.  Neck: Normal range of motion.  Cardiovascular: Normal rate, regular rhythm and normal heart sounds.   Pulmonary/Chest: Effort normal and breath sounds normal.  Abdominal: Soft. Bowel sounds are normal.  Musculoskeletal: Normal range of motion. He exhibits no edema, tenderness or deformity.  Lymphadenopathy:    He has no cervical adenopathy.  Neurological: He is alert and oriented to person,  place, and time.  Skin: Skin is warm and dry. No rash noted. No erythema.  Psychiatric: He has a normal mood and affect. His behavior is normal. Judgment and thought content normal.  Vitals reviewed.    Lab Results  Component Value Date   WBC 2.1 (L) 12/07/2016   HGB 12.5 (L) 12/07/2016   HCT 36.2 (L) 12/07/2016   MCV 96 12/07/2016   PLT 176 Platelet count consistent in citrate 12/07/2016     Chemistry      Component Value Date/Time   NA 138 09/04/2016 0905   NA 144 04/26/2016 1117   K 3.8 09/04/2016 0905   K 3.9 04/26/2016 1117   CL 102 09/04/2016  0905   CL 98 04/26/2016 1117   CO2 29 09/04/2016 0905   CO2 18 04/26/2016 1117   BUN 20 09/04/2016 0905   BUN 13 04/26/2016 1117   CREATININE 1.00 09/04/2016 0905   CREATININE 1.2 04/26/2016 1117      Component Value Date/Time   CALCIUM 9.5 09/04/2016 0905   CALCIUM 9.1 04/26/2016 1117   ALKPHOS 81 09/04/2016 0905   ALKPHOS 99 (H) 04/26/2016 1117   AST 22 09/04/2016 0905   AST 27 04/26/2016 1117   ALT 20 09/04/2016 0905   ALT 22 04/26/2016 1117   BILITOT 0.4 09/04/2016 0905   BILITOT 0.60 04/26/2016 1117         Impression and Plan: Mr. Dehn is a 76 year old white male. He has myelodysplasia. This is manifested as leukopenia.   From my point of view, everything looks fantastic. His blood counts are relatively stable. I looked at his blood under the microscope. I do not see anything that looks like progressive disease.  For right now, I think we can get him back in 4 months. He is happy with this.  He knows that he can always come back sooner if he has any problems.    Volanda Napoleon, MD 9/13/201811:56 AM

## 2016-12-08 ENCOUNTER — Ambulatory Visit: Payer: No Typology Code available for payment source | Admitting: Hematology & Oncology

## 2016-12-08 ENCOUNTER — Other Ambulatory Visit: Payer: No Typology Code available for payment source

## 2016-12-08 NOTE — Telephone Encounter (Signed)
We stopped lovastatin yesterday

## 2017-01-17 DIAGNOSIS — L821 Other seborrheic keratosis: Secondary | ICD-10-CM | POA: Diagnosis not present

## 2017-01-17 DIAGNOSIS — L853 Xerosis cutis: Secondary | ICD-10-CM | POA: Diagnosis not present

## 2017-01-17 DIAGNOSIS — L570 Actinic keratosis: Secondary | ICD-10-CM | POA: Diagnosis not present

## 2017-01-17 DIAGNOSIS — L400 Psoriasis vulgaris: Secondary | ICD-10-CM | POA: Diagnosis not present

## 2017-01-25 ENCOUNTER — Ambulatory Visit (INDEPENDENT_AMBULATORY_CARE_PROVIDER_SITE_OTHER): Payer: Medicare Other | Admitting: General Practice

## 2017-01-25 DIAGNOSIS — Z23 Encounter for immunization: Secondary | ICD-10-CM | POA: Diagnosis not present

## 2017-02-06 ENCOUNTER — Encounter: Payer: Self-pay | Admitting: Family Medicine

## 2017-02-06 ENCOUNTER — Other Ambulatory Visit (INDEPENDENT_AMBULATORY_CARE_PROVIDER_SITE_OTHER): Payer: Medicare Other

## 2017-02-06 ENCOUNTER — Other Ambulatory Visit: Payer: Self-pay | Admitting: Family Medicine

## 2017-02-06 DIAGNOSIS — E785 Hyperlipidemia, unspecified: Secondary | ICD-10-CM

## 2017-02-06 DIAGNOSIS — R9389 Abnormal findings on diagnostic imaging of other specified body structures: Secondary | ICD-10-CM

## 2017-02-06 LAB — COMPREHENSIVE METABOLIC PANEL
ALK PHOS: 81 U/L (ref 39–117)
ALT: 15 U/L (ref 0–53)
AST: 21 U/L (ref 0–37)
Albumin: 4.2 g/dL (ref 3.5–5.2)
BUN: 13 mg/dL (ref 6–23)
CHLORIDE: 101 meq/L (ref 96–112)
CO2: 33 mEq/L — ABNORMAL HIGH (ref 19–32)
Calcium: 9.3 mg/dL (ref 8.4–10.5)
Creatinine, Ser: 1 mg/dL (ref 0.40–1.50)
GFR: 76.83 mL/min (ref >60.00)
GLUCOSE: 105 mg/dL — AB (ref 70–99)
POTASSIUM: 4.4 meq/L (ref 3.5–5.1)
Sodium: 139 mEq/L (ref 135–145)
Total Bilirubin: 0.5 mg/dL (ref 0.2–1.2)
Total Protein: 7.4 g/dL (ref 6.0–8.3)

## 2017-02-06 LAB — LIPID PANEL
CHOLESTEROL: 168 mg/dL (ref 0–200)
HDL: 39.1 mg/dL (ref >39.00)
LDL CALC: 102 mg/dL — AB (ref 0–99)
NonHDL: 128.63
TRIGLYCERIDES: 134 mg/dL (ref 0.0–149.0)
Total CHOL/HDL Ratio: 4
VLDL: 26.8 mg/dL (ref 0.0–40.0)

## 2017-02-06 NOTE — Telephone Encounter (Signed)
Referral in

## 2017-02-12 ENCOUNTER — Other Ambulatory Visit: Payer: Self-pay | Admitting: Family Medicine

## 2017-02-12 DIAGNOSIS — E785 Hyperlipidemia, unspecified: Secondary | ICD-10-CM

## 2017-02-28 ENCOUNTER — Other Ambulatory Visit: Payer: Self-pay | Admitting: Family Medicine

## 2017-03-06 ENCOUNTER — Ambulatory Visit: Payer: No Typology Code available for payment source | Admitting: Family Medicine

## 2017-03-08 ENCOUNTER — Encounter: Payer: Self-pay | Admitting: Family Medicine

## 2017-03-08 ENCOUNTER — Ambulatory Visit (INDEPENDENT_AMBULATORY_CARE_PROVIDER_SITE_OTHER): Payer: Medicare Other | Admitting: Family Medicine

## 2017-03-08 ENCOUNTER — Encounter: Payer: Self-pay | Admitting: Endocrinology

## 2017-03-08 ENCOUNTER — Ambulatory Visit (INDEPENDENT_AMBULATORY_CARE_PROVIDER_SITE_OTHER): Payer: Medicare Other | Admitting: Endocrinology

## 2017-03-08 VITALS — BP 132/74 | HR 54 | Wt 192.0 lb

## 2017-03-08 VITALS — BP 120/78 | HR 56 | Temp 98.0°F | Ht 68.0 in | Wt 189.0 lb

## 2017-03-08 DIAGNOSIS — I10 Essential (primary) hypertension: Secondary | ICD-10-CM | POA: Diagnosis not present

## 2017-03-08 DIAGNOSIS — E039 Hypothyroidism, unspecified: Secondary | ICD-10-CM

## 2017-03-08 DIAGNOSIS — R7303 Prediabetes: Secondary | ICD-10-CM

## 2017-03-08 DIAGNOSIS — E785 Hyperlipidemia, unspecified: Secondary | ICD-10-CM

## 2017-03-08 DIAGNOSIS — H00015 Hordeolum externum left lower eyelid: Secondary | ICD-10-CM

## 2017-03-08 MED ORDER — GLUCOSE BLOOD VI STRP: ORAL_STRIP | 3 refills | Status: DC

## 2017-03-08 MED ORDER — ERYTHROMYCIN 5 MG/GM OP OINT: TOPICAL_OINTMENT | Freq: Three times a day (TID) | OPHTHALMIC | Status: DC

## 2017-03-08 MED ORDER — ENALAPRIL MALEATE 10 MG PO TABS: 10.0000 mg | ORAL_TABLET | Freq: Every day | ORAL | 1 refills | Status: DC

## 2017-03-08 MED ORDER — NIFEDIPINE ER OSMOTIC RELEASE 60 MG PO TB24: 60.0000 mg | ORAL_TABLET | Freq: Every day | ORAL | 3 refills | Status: DC

## 2017-03-08 MED ORDER — LABETALOL HCL 100 MG PO TABS: 100.0000 mg | ORAL_TABLET | Freq: Two times a day (BID) | ORAL | 3 refills | Status: DC

## 2017-03-08 MED ORDER — LEVOTHYROXINE SODIUM 50 MCG PO TABS: 50.0000 ug | ORAL_TABLET | Freq: Every day | ORAL | 3 refills | Status: DC

## 2017-03-08 NOTE — Progress Notes (Deleted)
[id number]

## 2017-03-08 NOTE — Progress Notes (Signed)
Patient ID: AYCE PIETRZYK, male    DOB: 1941-01-25  Age: 76 y.o. MRN: [id number]    Subjective:  Subjective  HPI Trevor Cooke presents for f/u bp and cholesterol.  No complaints.  He brought the life line screening he did -- endo scanned in already.   Review of Systems  Constitutional: Negative for appetite change, diaphoresis, fatigue and unexpected weight change.  Eyes: Negative for pain, redness and visual disturbance.  Respiratory: Negative for cough, chest tightness, shortness of breath and wheezing.   Cardiovascular: Negative for chest pain, palpitations and leg swelling.  Endocrine: Negative for cold intolerance, heat intolerance, polydipsia, polyphagia and polyuria.  Genitourinary: Negative for difficulty urinating, dysuria and frequency.  Neurological: Negative for dizziness, light-headedness, numbness and headaches.    History Past Medical History:  Diagnosis Date  . Arthritis    in neck  . CVA (cerebral infarction) 1996  . Hyperlipidemia   . Hypertension   . MDS (myelodysplastic syndrome), low grade ([place]) 04/26/2016  . Psoriasis     He has a past surgical history that includes Cleft palate repair (1940's).   His family history includes Hypertension in his father and mother; Stroke in his father.He reports that he has quit smoking. he has never used smokeless tobacco. He reports that he drinks alcohol. He reports that he does not use drugs.  Current Outpatient Medications on File Prior to Visit  Medication Sig Dispense Refill  . aspirin 81 MG tablet Take 81 mg by mouth daily.    . calcitRIOL (ROCALTROL) 0.5 MCG capsule Take 0.5 mcg by mouth daily.    . Cholecalciferol (VITAMIN D) 2000 units CAPS Take by mouth.    . clobetasol cream (TEMOVATE) 6.29 % Apply 1 application topically 2 (two) times daily.    . fluocinonide (LIDEX) 0.05 % external solution Apply topically daily. Apply to scalp to treat affected areas 60 mL 5  . Multiple Vitamins-Minerals  (MULTIVITAMIN WITH MINERALS) tablet Take by mouth daily.    . ranitidine (ZANTAC) 150 MG tablet Take 150 mg by mouth 2 (two) times daily.    . Selenium 200 MCG TABS Take 200 mcg by mouth daily.    . Zinc 50 MG CAPS Take by mouth.     No current facility-administered medications on file prior to visit.      Objective:  Objective  Physical Exam  Constitutional: He is oriented to person, place, and time. Vital signs are normal. He appears well-developed and well-nourished. He is sleeping.  HENT:  Head: Normocephalic and atraumatic.  Mouth/Throat: Oropharynx is clear and moist.  Eyes: EOM are normal. Pupils are equal, round, and reactive to light.  Neck: Normal range of motion. Neck supple. No thyromegaly present.  Cardiovascular: Normal rate and regular rhythm.  No murmur heard. Pulmonary/Chest: Effort normal and breath sounds normal. No respiratory distress. He has no wheezes. He has no rales. He exhibits no tenderness.  Musculoskeletal: He exhibits no edema or tenderness.  Neurological: He is alert and oriented to person, place, and time.  Skin: Skin is warm and dry.  Psychiatric: He has a normal mood and affect. His behavior is normal. Judgment and thought content normal.  Nursing note and vitals reviewed.  BP 120/78   Pulse (!) 56   Temp 98 F (36.7 C) (Oral)   Ht 5\' 8"  (1.727 m)   Wt 189 lb (85.7 kg)   SpO2 98%   BMI 28.74 kg/m  Wt Readings from Last 3 Encounters:  03/08/17 189 lb (  85.7 kg)  03/08/17 192 lb (87.1 kg)  12/07/16 188 lb 4 oz (85.4 kg)     Lab Results  Component Value Date   WBC 2.1 (L) 12/07/2016   HGB 12.5 (L) 12/07/2016   HCT 36.2 (L) 12/07/2016   PLT 176 Platelet count consistent in citrate 12/07/2016   GLUCOSE 105 (H) 02/06/2017   CHOL 168 02/06/2017   TRIG 134.0 02/06/2017   HDL 39.10 02/06/2017   LDLCALC 102 (H) 02/06/2017   ALT 15 02/06/2017   AST 21 02/06/2017   NA 139 02/06/2017   K 4.4 02/06/2017   CL 101 02/06/2017   CREATININE 1.00  02/06/2017   BUN 13 02/06/2017   CO2 33 (H) 02/06/2017   TSH 4.57 (H) 09/04/2016   PSA 0.45 09/05/2013   HGBA1C 6.2 09/04/2016   MICROALBUR <0.7 08/27/2014    No results found.   Assessment & Plan:  Plan  I have discontinued Trevor Cooke's lovastatin. I have also changed his enalapril. Additionally, I am having him maintain his aspirin, fluocinonide, multivitamin with minerals, Zinc, Vitamin D, ranitidine, labetalol, NIFEdipine, levothyroxine, clobetasol cream, calcitRIOL, Selenium, and glucose blood. We will continue to administer erythromycin.  Meds ordered this encounter  Medications  . enalapril (VASOTEC) 10 MG tablet    Sig: Take 1 tablet (10 mg total) by mouth daily.    Dispense:  90 tablet    Refill:  1  . labetalol (NORMODYNE) 100 MG tablet    Sig: Take 1 tablet (100 mg total) by mouth 2 (two) times daily.    Dispense:  180 tablet    Refill:  3  . NIFEdipine (PROCARDIA XL/ADALAT-CC) 60 MG 24 hr tablet    Sig: Take 1 tablet (60 mg total) by mouth daily.    Dispense:  90 tablet    Refill:  3  . levothyroxine (SYNTHROID, LEVOTHROID) 50 MCG tablet    Sig: Take 1 tablet (50 mcg total) by mouth daily.    Dispense:  90 tablet    Refill:  3  . glucose blood (FREESTYLE PRECISION NEO TEST) test strip    Sig: Use as instructed to check blood sugar once a day. DX  E11.9    Dispense:  100 each    Refill:  3  . erythromycin ophthalmic ointment    Problem List Items Addressed This Visit      Unprioritized   Essential hypertension    Well controlled, no changes to meds. Encouraged heart healthy diet such as the DASH diet and exercise as tolerated.       Relevant Medications   enalapril (VASOTEC) 10 MG tablet   labetalol (NORMODYNE) 100 MG tablet   NIFEdipine (PROCARDIA XL/ADALAT-CC) 60 MG 24 hr tablet   Hyperlipidemia    Encouraged heart healthy diet, increase exercise, avoid trans fats, consider a krill oil cap daily      Relevant Medications   enalapril  (VASOTEC) 10 MG tablet   labetalol (NORMODYNE) 100 MG tablet   NIFEdipine (PROCARDIA XL/ADALAT-CC) 60 MG 24 hr tablet   Other Relevant Orders   Lipid panel   Hemoglobin A1c   Comprehensive metabolic panel   TSH   Hypothyroidism    Per endo      Relevant Medications   labetalol (NORMODYNE) 100 MG tablet   levothyroxine (SYNTHROID, LEVOTHROID) 50 MCG tablet   Other Relevant Orders   TSH    Other Visit Diagnoses    Borderline diabetes    -  Primary   Relevant Medications  glucose blood (FREESTYLE PRECISION NEO TEST) test strip   Other Relevant Orders   Lipid panel   Hemoglobin A1c   Comprehensive metabolic panel   TSH   Hordeolum externum of left lower eyelid       Relevant Medications   erythromycin ophthalmic ointment              Warm compresses to eye   Follow-up: Return in about 6 months (around 09/06/2017), or if symptoms worsen or fail to improve.  [name], DO

## 2017-03-08 NOTE — Progress Notes (Signed)
Subjective:    Patient ID: Trevor Cooke, male    DOB: [birth year], 76 y.o.   MRN: [id number]  HPI Pt is referred by Dr L-C, for nodular thyroid.  Pt was noted to have a nodule at the thyroid in 1996, when I did aspiration of cystic fluid twice.  cytol result is not available. He has no h/o XRT or surgery to the neck.  He has slight dysphagia sensation in the neck, but no assoc pain.  He developed hypothyroidism in early 2018, and was started on synthroid.   Past Medical History:  Diagnosis Date  . Arthritis    in neck  . CVA (cerebral infarction) 1996  . Hyperlipidemia   . Hypertension   . MDS (myelodysplastic syndrome), low grade ([place]) 04/26/2016  . Psoriasis     Past Surgical History:  Procedure Laterality Date  . CLEFT PALATE REPAIR  1940's   left     Social History   Socioeconomic History  . Marital status: Married    Spouse name: Not on file  . Number of children: Not on file  . Years of education: Not on file  . Highest education level: Not on file  Social Needs  . Financial resource strain: Not on file  . Food insecurity - worry: Not on file  . Food insecurity - inability: Not on file  . Transportation needs - medical: Not on file  . Transportation needs - non-medical: Not on file  Occupational History  . Not on file  Tobacco Use  . Smoking status: Former Research scientist (life sciences)  . Smokeless tobacco: Never Used  Substance and Sexual Activity  . Alcohol use: Yes    Alcohol/week: 0.0 oz    Comment: 2- 12oz Miller Lite per week.    . Drug use: No  . Sexual activity: Yes  Other Topics Concern  . Not on file  Social History Narrative  . Not on file    Current Outpatient Medications on File Prior to Visit  Medication Sig Dispense Refill  . aspirin 81 MG tablet Take 81 mg by mouth daily.    . Cholecalciferol (VITAMIN D) 2000 units CAPS Take by mouth.    . fluocinonide (LIDEX) 0.05 % external solution Apply topically daily. Apply to scalp to treat affected areas 60 mL 5   . Multiple Vitamins-Minerals (MULTIVITAMIN WITH MINERALS) tablet Take by mouth daily.    . ranitidine (ZANTAC) 150 MG tablet Take 150 mg by mouth 2 (two) times daily.    . Zinc 50 MG CAPS Take by mouth.     No current facility-administered medications on file prior to visit.     No Known Allergies  Family History  Problem Relation Age of Onset  . Hypertension Mother   . Hypertension Father   . Stroke Father   . Colon cancer Neg Hx   . Thyroid disease Neg Hx     BP 132/74 (BP Location: Left Arm, Patient Position: Sitting, Cuff Size: Normal)   Pulse (!) 54   Wt 192 lb (87.1 kg)   SpO2 97%   BMI 29.19 kg/m    Review of Systems Denies weight change, hoarseness, visual loss, chest pain, sob, cough, diarrhea, flushing, easy bruising, depression, headache, numbness, and rhinorrhea.  He has cold intolerance, hearing loss, and itching.    Objective:   Physical Exam VS: see vs page GEN: no distress HEAD: head: no deformity eyes: no periorbital swelling, no proptosis external nose and ears are normal mouth: no lesion seen.  Ears: bilat HA's.  NECK: 2-3 cm right thyroid nodule is noted CHEST WALL: no deformity LUNGS: clear to auscultation CV: reg rate and rhythm, no murmur ABD: abdomen is soft, nontender.  no hepatosplenomegaly.  not distended.  no hernia.  MUSCULOSKELETAL: muscle bulk and strength are grossly normal.  no obvious joint swelling.  gait is normal and steady EXTEMITIES: no deformity.  1+ bilat leg edema.  PULSES: no carotid bruit NEURO:  cn 2-12 grossly intact.   readily moves all 4's.  sensation is intact to touch on all 4's.  SKIN:  Normal texture and temperature.  No suspicious lesion is visible.  Mild psoriasis on the forearms. NODES:  None palpable at the neck.  PSYCH: alert, well-oriented.  Does not appear anxious nor depressed.    Lab Results  Component Value Date   TSH 4.57 (H) 09/04/2016   [place] (2014): Chronic mixed solid and cystic right thyroid  nodule, by report only mildly increased since 05/20/2002.  This lesion does meet consensus criteria for biopsy.  Occasional sub centimeter hypoechoic nodules in the left lobe, inconsequential.  I have reviewed outside records, and summarized: Pt was noted to have goiter, and referred here.  He is also monitored for myelodysplastic syndrome     Assessment & Plan:  Multinodular goiter, possibly due to chronic thyroiditis.  Hypothyroidism, new.  well-replaced.  Myelodysplastic syndrome: in view of this and other sxs, he would not benefit from further w/u of thyroid nodules.   Patient Instructions  Please continue the same levothyroxine No further thyroid testing is needed. I would be happy to see you back here as needed.

## 2017-03-08 NOTE — Patient Instructions (Signed)

## 2017-03-08 NOTE — Assessment & Plan Note (Signed)
Encouraged heart healthy diet, increase exercise, avoid trans fats, consider a krill oil cap daily 

## 2017-03-08 NOTE — Patient Instructions (Signed)
Please continue the same levothyroxine No further thyroid testing is needed. I would be happy to see you back here as needed.

## 2017-03-08 NOTE — Assessment & Plan Note (Signed)
Well controlled, no changes to meds. Encouraged heart healthy diet such as the DASH diet and exercise as tolerated.  °

## 2017-03-08 NOTE — Assessment & Plan Note (Signed)
Per endo °

## 2017-03-09 ENCOUNTER — Telehealth: Payer: Self-pay | Admitting: Family Medicine

## 2017-03-09 ENCOUNTER — Other Ambulatory Visit: Payer: Self-pay

## 2017-03-09 MED ORDER — ERYTHROMYCIN 5 MG/GM OP OINT: 1.0000 "application " | TOPICAL_OINTMENT | Freq: Three times a day (TID) | OPHTHALMIC | 1 refills | Status: DC

## 2017-03-09 NOTE — Telephone Encounter (Signed)
Copied from [place]. Topic: Quick Communication - See Telephone Encounter >> Mar 09, 2017  3:01 PM Burnis Medin, NT wrote: CRM for notification. See Telephone encounter for: Pt. Called in and said he went to the doctor yesterday and the doctor said she was going to send a prescription for an antibiotic for his eye but it was never called in to the pharmacy. Pt uses [facility] on [address].  03/09/17.

## 2017-03-09 NOTE — Telephone Encounter (Signed)
See attached.   Didn't receive antibiotic at pharmacy.

## 2017-03-09 NOTE — Telephone Encounter (Signed)
Pt. Called in and said he went to the doctor yesterday and the doctor said she was going to send a prescription for an antibiotic for his eye but it was never called in to the pharmacy. Pt uses [facility] [address]

## 2017-03-12 NOTE — Telephone Encounter (Signed)
Please advise 

## 2017-03-12 NOTE — Telephone Encounter (Signed)
Erythromycin oint was called in yesterday

## 2017-03-13 NOTE — Telephone Encounter (Signed)
Patient notified that rx was sent in  

## 2017-03-16 ENCOUNTER — Other Ambulatory Visit: Payer: Self-pay | Admitting: Family Medicine

## 2017-03-16 ENCOUNTER — Encounter: Payer: Self-pay | Admitting: Family Medicine

## 2017-03-16 DIAGNOSIS — E039 Hypothyroidism, unspecified: Secondary | ICD-10-CM

## 2017-03-16 DIAGNOSIS — I10 Essential (primary) hypertension: Secondary | ICD-10-CM

## 2017-03-16 MED ORDER — ENALAPRIL MALEATE 10 MG PO TABS: 10.0000 mg | ORAL_TABLET | Freq: Every day | ORAL | 1 refills | Status: DC

## 2017-04-10 ENCOUNTER — Inpatient Hospital Stay (HOSPITAL_BASED_OUTPATIENT_CLINIC_OR_DEPARTMENT_OTHER): Payer: Medicare Other | Admitting: Hematology & Oncology

## 2017-04-10 ENCOUNTER — Other Ambulatory Visit: Payer: Self-pay

## 2017-04-10 ENCOUNTER — Inpatient Hospital Stay: Payer: Medicare Other | Attending: Hematology & Oncology

## 2017-04-10 VITALS — BP 131/80 | HR 53 | Temp 97.4°F | Resp 20 | Wt 188.2 lb

## 2017-04-10 DIAGNOSIS — D469 Myelodysplastic syndrome, unspecified: Secondary | ICD-10-CM | POA: Diagnosis not present

## 2017-04-10 DIAGNOSIS — E785 Hyperlipidemia, unspecified: Secondary | ICD-10-CM

## 2017-04-10 DIAGNOSIS — D72819 Decreased white blood cell count, unspecified: Secondary | ICD-10-CM | POA: Insufficient documentation

## 2017-04-10 DIAGNOSIS — M109 Gout, unspecified: Secondary | ICD-10-CM | POA: Diagnosis not present

## 2017-04-10 DIAGNOSIS — Z79899 Other long term (current) drug therapy: Secondary | ICD-10-CM

## 2017-04-10 DIAGNOSIS — R7303 Prediabetes: Secondary | ICD-10-CM

## 2017-04-10 DIAGNOSIS — Z7982 Long term (current) use of aspirin: Secondary | ICD-10-CM | POA: Insufficient documentation

## 2017-04-10 DIAGNOSIS — D4620 Refractory anemia with excess of blasts, unspecified: Secondary | ICD-10-CM

## 2017-04-10 DIAGNOSIS — E039 Hypothyroidism, unspecified: Secondary | ICD-10-CM

## 2017-04-10 LAB — IRON AND TIBC
IRON: 65 ug/dL (ref 42–163)
Saturation Ratios: 23 % — ABNORMAL LOW (ref 42–163)
TIBC: 284 ug/dL (ref 202–409)
UIBC: 219 ug/dL

## 2017-04-10 LAB — FERRITIN: Ferritin: 185 ng/mL (ref 22–316)

## 2017-04-10 LAB — CBC WITH DIFFERENTIAL (CANCER CENTER ONLY)
BASOS PCT: 0 %
Basophils Absolute: 0 10*3/uL (ref 0.0–0.1)
Eosinophils Absolute: 0.1 10*3/uL (ref 0.0–0.5)
Eosinophils Relative: 3 %
HEMATOCRIT: 35.7 % — AB (ref 38.7–49.9)
HEMOGLOBIN: 12.2 g/dL — AB (ref 13.0–17.1)
LYMPHS ABS: 1 10*3/uL (ref 0.9–3.3)
Lymphocytes Relative: 30 %
MCH: 32.8 pg (ref 28.0–33.4)
MCHC: 34.2 g/dL (ref 32.0–35.9)
MCV: 96 fL (ref 82.0–98.0)
MONO ABS: 0.2 10*3/uL (ref 0.1–0.9)
MONOS PCT: 6 %
NEUTROS ABS: 2 10*3/uL (ref 1.5–6.5)
Neutrophils Relative %: 61 %
Platelet Count: 186 10*3/uL (ref 140–400)
RBC: 3.72 MIL/uL — ABNORMAL LOW (ref 4.20–5.70)
RDW: 13 % (ref 11.1–15.7)
WBC Count: 3.3 10*3/uL — ABNORMAL LOW (ref 4.0–10.3)

## 2017-04-10 NOTE — Progress Notes (Signed)
Hematology and Oncology Follow Up Visit  IZAIA SAY [id number] [birth year] 77 y.o. 04/10/2017   Principle Diagnosis:   Myelodysplastic syndrome-low-grade (IPSS-R score = 1) - tp53+  Current Therapy:    Observation     Interim History:  Mr. Obar is back for follow-up.  We saw him 4 months ago.  Since then, he has been doing quite well.  He and his wife were very busy over the holidays.  They are busy visiting family.  He has had no problems with fever.  He did have a gout problem with his hip.  He also had an ocular infection with a stye that opened up.  He has had no bleeding.  Has had no rashes.  Is had no cough or shortness of breath.  There is been no nausea or vomiting.  He has had no change in bowel or bladder habits.    Overall, his performance status is ECOG 1.   Medications:  Current Outpatient Medications:  .  aspirin 81 MG tablet, Take 81 mg by mouth daily., Disp: , Rfl:  .  calcitRIOL (ROCALTROL) 0.5 MCG capsule, Take 0.5 mcg by mouth daily., Disp: , Rfl:  .  Cholecalciferol (VITAMIN D) 2000 units CAPS, Take by mouth., Disp: , Rfl:  .  clobetasol cream (TEMOVATE) 3.64 %, Apply 1 application topically 2 (two) times daily., Disp: , Rfl:  .  enalapril (VASOTEC) 10 MG tablet, Take 1 tablet (10 mg total) by mouth daily., Disp: 90 tablet, Rfl: 1 .  fluocinonide (LIDEX) 0.05 % external solution, Apply topically daily. Apply to scalp to treat affected areas, Disp: 60 mL, Rfl: 5 .  glucose blood (FREESTYLE PRECISION NEO TEST) test strip, Use as instructed to check blood sugar once a day. DX  E11.9, Disp: 100 each, Rfl: 3 .  labetalol (NORMODYNE) 100 MG tablet, Take 1 tablet (100 mg total) by mouth 2 (two) times daily., Disp: 180 tablet, Rfl: 3 .  levothyroxine (SYNTHROID, LEVOTHROID) 50 MCG tablet, Take 1 tablet (50 mcg total) by mouth daily., Disp: 90 tablet, Rfl: 3 .  Multiple Vitamins-Minerals (MULTIVITAMIN WITH MINERALS) tablet, Take by mouth daily., Disp: ,  Rfl:  .  NIFEdipine (PROCARDIA XL/ADALAT-CC) 60 MG 24 hr tablet, Take 1 tablet (60 mg total) by mouth daily., Disp: 90 tablet, Rfl: 3 .  ranitidine (ZANTAC) 150 MG tablet, Take 150 mg by mouth 2 (two) times daily., Disp: , Rfl:  .  Selenium 200 MCG TABS, Take 200 mcg by mouth daily., Disp: , Rfl:  .  Zinc 50 MG CAPS, Take by mouth., Disp: , Rfl:   Allergies: No Known Allergies  Past Medical History, Surgical history, Social history, and Family History were reviewed and updated.  Review of Systems: Review of Systems  Constitutional: Negative.   HENT: Negative.   Eyes: Negative.   Respiratory: Negative.   Cardiovascular: Negative.   Gastrointestinal: Negative.   Genitourinary: Negative.   Musculoskeletal: Negative.   Skin: Negative.   Neurological: Negative.   Endo/Heme/Allergies: Negative.   Psychiatric/Behavioral: Negative.      Physical Exam:  weight is 188 lb 4 oz (85.4 kg). His oral temperature is 97.4 F (36.3 C) (abnormal). His blood pressure is 131/80 and his pulse is 53 (abnormal). His respiration is 20 and oxygen saturation is 97%.   Wt Readings from Last 3 Encounters:  04/10/17 188 lb 4 oz (85.4 kg)  03/08/17 189 lb (85.7 kg)  03/08/17 192 lb (87.1 kg)     Physical Exam  Constitutional:  He is oriented to person, place, and time.  HENT:  Head: Normocephalic and atraumatic.  Mouth/Throat: Oropharynx is clear and moist.  Eyes: EOM are normal. Pupils are equal, round, and reactive to light.  Neck: Normal range of motion.  Cardiovascular: Normal rate, regular rhythm and normal heart sounds.  Pulmonary/Chest: Effort normal and breath sounds normal.  Abdominal: Soft. Bowel sounds are normal.  Musculoskeletal: Normal range of motion. He exhibits no edema, tenderness or deformity.  Lymphadenopathy:    He has no cervical adenopathy.  Neurological: He is alert and oriented to person, place, and time.  Skin: Skin is warm and dry. No rash noted. No erythema.    Psychiatric: He has a normal mood and affect. His behavior is normal. Judgment and thought content normal.  Vitals reviewed.    Lab Results  Component Value Date   WBC 2.1 (L) 12/07/2016   HGB 12.5 (L) 12/07/2016   HCT 35.7 (L) 04/10/2017   MCV 96.0 04/10/2017   PLT 176 Platelet count consistent in citrate 12/07/2016     Chemistry      Component Value Date/Time   NA 139 02/06/2017 0926   NA 144 04/26/2016 1117   K 4.4 02/06/2017 0926   K 3.9 04/26/2016 1117   CL 101 02/06/2017 0926   CL 98 04/26/2016 1117   CO2 33 (H) 02/06/2017 0926   CO2 18 04/26/2016 1117   BUN 13 02/06/2017 0926   BUN 13 04/26/2016 1117   CREATININE 1.00 02/06/2017 0926   CREATININE 1.2 04/26/2016 1117      Component Value Date/Time   CALCIUM 9.3 02/06/2017 0926   CALCIUM 9.1 04/26/2016 1117   ALKPHOS 81 02/06/2017 0926   ALKPHOS 99 (H) 04/26/2016 1117   AST 21 02/06/2017 0926   AST 27 04/26/2016 1117   ALT 15 02/06/2017 0926   ALT 22 04/26/2016 1117   BILITOT 0.5 02/06/2017 0926   BILITOT 0.60 04/26/2016 1117         Impression and Plan: Mr. Rideaux is a 77 year old white male. He has myelodysplasia. This is manifested as leukopenia.   I looked at his blood under the microscope.  I did not see any immature myeloid cells.  He had no blasts.  I think we can probably get him back in 6 months now.  Everything really has been doing well for him.  He has not show any evidence of progressive disease.  I told him that he can always call us if he has any problems before we see him in 6 months.  Volanda Napoleon, MD 1/15/20199:48 AM

## 2017-04-12 LAB — SAVE SMEAR

## 2017-05-08 NOTE — Progress Notes (Signed)
Subjective:   Trevor Cooke is a 77 y.o. male who presents for Medicare Annual/Subsequent preventive examination.  Review of Systems: No ROS.  Medicare Wellness Visit. Additional risk factors are reflected in the social history. Cardiac Risk Factors include: advanced age (>17men, >15 women);male gender;diabetes mellitus;dyslipidemia;hypertension Sleep patterns: wakes twice to urinate. Sleeps 11 p -8a. Feels rested.  Home Safety/Smoke Alarms: Feels safe in home. Smoke alarms in place.  Living environment; residence and Firearm Safety: 1 story home. Lives with wife.  Seat Belt Safety/Bike Helmet: Wears seat belt.  Male:   CCS-  Last 03/06/13.  No longer doing routine screening due to age. PSA-  Lab Results  Component Value Date   PSA 0.45 09/05/2013   PSA 0.39 09/18/2012   PSA 0.73 05/14/2009       Objective:    Vitals: BP 120/62 (BP Location: Left Arm, Patient Position: Sitting, Cuff Size: Normal)   Pulse (!) 59   Wt 189 lb 12.8 oz (86.1 kg)   SpO2 96%   BMI 28.86 kg/m   Body mass index is 28.86 kg/m.  Advanced Directives 05/10/2017 04/10/2017 12/07/2016 08/28/2016 07/05/2016 03/16/2016 03/02/2016  Does Patient Have a Medical Advance Directive? Yes Yes Yes Yes Yes Yes Yes  Type of Paramedic of [place];Living will [place];Living will [place];Living will [place];Living will [place];Living will [place];Living will [place];Living will  Does patient want to make changes to medical advance directive? No - Patient declined - - - - - No - Patient declined  Copy of [place] in Chart? No - copy requested No - copy requested No - copy requested - Yes No - copy requested No - copy requested  Would patient like information on creating a medical advance directive? - - No - Patient declined - - - -    Tobacco Social  History   Tobacco Use  Smoking Status Former Smoker  Smokeless Tobacco Never Used     Counseling given: Not Answered   Clinical Intake: Pain : No/denies pain   Past Medical History:  Diagnosis Date  . Arthritis    in neck  . CVA (cerebral infarction) 1996  . Hyperlipidemia   . Hypertension   . MDS (myelodysplastic syndrome), low grade ([place]) 04/26/2016  . Psoriasis    Past Surgical History:  Procedure Laterality Date  . CLEFT PALATE REPAIR  1940's   left    Family History  Problem Relation Age of Onset  . Hypertension Mother   . Hypertension Father   . Stroke Father   . Diabetes Sister   . Colon cancer Neg Hx   . Thyroid disease Neg Hx    Social History   Socioeconomic History  . Marital status: Married    Spouse name: None  . Number of children: None  . Years of education: None  . Highest education level: None  Social Needs  . Financial resource strain: None  . Food insecurity - worry: None  . Food insecurity - inability: None  . Transportation needs - medical: None  . Transportation needs - non-medical: None  Occupational History  . None  Tobacco Use  . Smoking status: Former Research scientist (life sciences)  . Smokeless tobacco: Never Used  Substance and Sexual Activity  . Alcohol use: Yes    Alcohol/week: 0.0 oz    Comment: occasional wine  . Drug use: No  . Sexual activity: No  Other Topics Concern  . None  Social History Narrative  . None    Outpatient Encounter Medications as of 05/10/2017  Medication Sig  . aspirin 81 MG tablet Take 81 mg by mouth daily.  . Cholecalciferol (VITAMIN D) 2000 units CAPS Take by mouth.  . clobetasol cream (TEMOVATE) 5.95 % Apply 1 application topically 2 (two) times daily.  . enalapril (VASOTEC) 10 MG tablet Take 1 tablet (10 mg total) by mouth daily.  . fluocinonide (LIDEX) 0.05 % external solution Apply topically daily. Apply to scalp to treat affected areas  . glucose blood (FREESTYLE PRECISION NEO TEST) test strip Use as  instructed to check blood sugar once a day. DX  E11.9  . labetalol (NORMODYNE) 100 MG tablet Take 1 tablet (100 mg total) by mouth 2 (two) times daily.  Marland Kitchen levothyroxine (SYNTHROID, LEVOTHROID) 50 MCG tablet Take 1 tablet (50 mcg total) by mouth daily.  . Multiple Vitamins-Minerals (MULTIVITAMIN WITH MINERALS) tablet Take by mouth daily.  Marland Kitchen NIFEdipine (PROCARDIA XL/ADALAT-CC) 60 MG 24 hr tablet Take 1 tablet (60 mg total) by mouth daily.  . ranitidine (ZANTAC) 150 MG tablet Take 150 mg by mouth 2 (two) times daily.  . Selenium 200 MCG TABS Take 200 mcg by mouth daily.  . Zinc 50 MG CAPS Take by mouth.  . calcitRIOL (ROCALTROL) 0.5 MCG capsule Take 0.5 mcg by mouth daily.   No facility-administered encounter medications on file as of 05/10/2017.     Activities of Daily Living In your present state of health, do you have any difficulty performing the following activities: 05/10/2017  Hearing? Y  Comment Wears hearing aids.   Vision? N  Comment Contacts and or glasses. Dr.Patroski yearly.  Difficulty concentrating or making decisions? N  Walking or climbing stairs? N  Dressing or bathing? N  Doing errands, shopping? N  Preparing Food and eating ? N  Using the Toilet? N  In the past six months, have you accidently leaked urine? N  Do you have problems with loss of bowel control? N  Managing your Medications? N  Managing your Finances? N  Housekeeping or managing your Housekeeping? N  Some recent data might be hidden    Patient Care Team: Carollee Herter, Alferd Apa, DO as PCP - General (Family Medicine) Eduard Roux, MD (Gastroenterology) Renita Papa, MD as Referring Physician (Dermatology) Christiane Ha as Referring Physician (Optometry) Inda Castle, MD (Inactive) as Consulting Physician (Gastroenterology) Paulla Dolly Tamala Fothergill, DPM as Consulting Physician (Podiatry)   Assessment:   This is a routine wellness examination for [place]. Physical assessment deferred to  PCP.  Exercise Activities and Dietary recommendations   Diet (meal preparation, eat out, water intake, caffeinated beverages, dairy products, fruits and vegetables): in general, a "healthy" diet  , well balanced     Goals    . lose 10 lbs by October 2017 (pt-stated)       Fall Risk Fall Risk  05/10/2017 03/08/2017 03/16/2016 03/02/2016 05/03/2015  Falls in the past year? Yes Yes No No Yes  Number falls in past yr: 2 or more 1 - - 2 or more  Injury with Fall? No No - - No  Follow up Education provided;Falls prevention discussed - - - Education provided;Falls prevention discussed    Depression Screen PHQ 2/9 Scores 05/10/2017 03/08/2017 03/02/2016 05/03/2015  PHQ - 2 Score 0 0 0 0    Cognitive Function MMSE - Mini Mental State Exam 05/10/2017 03/02/2016 05/03/2015  Orientation to time 5 5  5  Orientation to Place 5 5 5   Registration 3 3 3   Attention/ Calculation 5 5 5   Recall 3 3 3   Language- name 2 objects 2 2 2   Language- repeat 1 1 1   Language- follow 3 step command 3 3 3   Language- read & follow direction 1 1 1   Write a sentence 1 1 1   Copy design 1 1 1   Total score 30 30 30         Immunization History  Administered Date(s) Administered  . H1N1 04/21/2008  . Influenza Split 01/30/2012  . Influenza Whole 12/28/2003, 01/02/2008, 12/29/2009  . Influenza, High Dose Seasonal PF 01/27/2013, 01/30/2014, 01/28/2015, 01/27/2016, 01/25/2017  . Pneumococcal Conjugate-13 04/07/2013  . Pneumococcal Polysaccharide-23 04/12/2010  . Td 09/25/1995  . Tdap 10/21/2010  . Zoster 01/30/2012   Screening Tests Health Maintenance  Topic Date Due  . HEMOGLOBIN A1C  03/06/2017  . OPHTHALMOLOGY EXAM  10/10/2017  . FOOT EXAM  03/08/2018  . TETANUS/TDAP  10/20/2020  . INFLUENZA VACCINE  Completed  . PNA vac Low Risk Adult  Completed      Plan:   Follow up with Dr.Lowne as scheduled 09/07/17.  Continue to eat heart healthy diet (full of fruits, vegetables, whole grains, lean protein,  water--limit salt, fat, and sugar intake) and increase physical activity as tolerated.  Continue doing brain stimulating activities (puzzles, reading, adult coloring books, staying active) to keep memory sharp.   Bring a copy of your living will and/or healthcare power of attorney to your next office visit.    I have personally reviewed and noted the following in the patient's chart:   . Medical and social history . Use of alcohol, tobacco or illicit drugs  . Current medications and supplements . Functional ability and status . Nutritional status . Physical activity . Advanced directives . List of other physicians . Hospitalizations, surgeries, and ER visits in previous 12 months . Vitals . Screenings to include cognitive, depression, and falls . Referrals and appointments  In addition, I have reviewed and discussed with patient certain preventive protocols, quality metrics, and best practice recommendations. A written personalized care plan for preventive services as well as general preventive health recommendations were provided to patient.     Shela Nevin, [place]  05/10/2017

## 2017-05-10 ENCOUNTER — Other Ambulatory Visit: Payer: Medicare Other

## 2017-05-10 ENCOUNTER — Ambulatory Visit (INDEPENDENT_AMBULATORY_CARE_PROVIDER_SITE_OTHER): Payer: Medicare Other | Admitting: *Deleted

## 2017-05-10 ENCOUNTER — Encounter: Payer: Self-pay | Admitting: *Deleted

## 2017-05-10 ENCOUNTER — Other Ambulatory Visit (INDEPENDENT_AMBULATORY_CARE_PROVIDER_SITE_OTHER): Payer: Medicare Other

## 2017-05-10 VITALS — BP 120/62 | HR 59 | Wt 189.8 lb

## 2017-05-10 DIAGNOSIS — Z0000 Encounter for general adult medical examination without abnormal findings: Secondary | ICD-10-CM | POA: Diagnosis not present

## 2017-05-10 DIAGNOSIS — E785 Hyperlipidemia, unspecified: Secondary | ICD-10-CM | POA: Diagnosis not present

## 2017-05-10 LAB — COMPREHENSIVE METABOLIC PANEL
ALBUMIN: 4.2 g/dL (ref 3.5–5.2)
ALK PHOS: 86 U/L (ref 39–117)
ALT: 16 U/L (ref 0–53)
AST: 20 U/L (ref 0–37)
BUN: 16 mg/dL (ref 6–23)
CALCIUM: 9.2 mg/dL (ref 8.4–10.5)
CHLORIDE: 99 meq/L (ref 96–112)
CO2: 32 mEq/L (ref 19–32)
Creatinine, Ser: 1.16 mg/dL (ref 0.40–1.50)
GFR: 64.69 mL/min (ref >60.00)
Glucose, Bld: 114 mg/dL — ABNORMAL HIGH (ref 70–99)
Potassium: 4.1 mEq/L (ref 3.5–5.1)
SODIUM: 137 meq/L (ref 135–145)
TOTAL PROTEIN: 7.6 g/dL (ref 6.0–8.3)
Total Bilirubin: 0.5 mg/dL (ref 0.2–1.2)

## 2017-05-10 LAB — LIPID PANEL
Cholesterol: 183 mg/dL (ref 0–200)
HDL: 42.8 mg/dL (ref >39.00)
LDL CALC: 116 mg/dL — AB (ref 0–99)
NonHDL: 140.49
TRIGLYCERIDES: 123 mg/dL (ref 0.0–149.0)
Total CHOL/HDL Ratio: 4
VLDL: 24.6 mg/dL (ref 0.0–40.0)

## 2017-05-10 NOTE — Patient Instructions (Signed)
Follow up with Dr.Lowne as scheduled 09/07/17.  Continue to eat heart healthy diet (full of fruits, vegetables, whole grains, lean protein, water--limit salt, fat, and sugar intake) and increase physical activity as tolerated.  Continue doing brain stimulating activities (puzzles, reading, adult coloring books, staying active) to keep memory sharp.   Bring a copy of your living will and/or healthcare power of attorney to your next office visit.    [name] , Thank you for taking time to come for your Medicare Wellness Visit. I appreciate your ongoing commitment to your health goals. Please review the following plan we discussed and let me know if I can assist you in the future.   These are the goals we discussed: Goals    . lose 10 lbs by October 2017 (pt-stated)       This is a list of the screening recommended for you and due dates:  Health Maintenance  Topic Date Due  . Hemoglobin A1C  03/06/2017  . Eye exam for diabetics  10/10/2017  . Complete foot exam   03/08/2018  . Tetanus Vaccine  10/20/2020  . Flu Shot  Completed  . Pneumonia vaccines  Completed    Health Maintenance, Male A healthy lifestyle and preventive care is important for your health and wellness. Ask your health care provider about what schedule of regular examinations is right for you. What should I know about weight and diet? Eat a Healthy Diet  Eat plenty of vegetables, fruits, whole grains, low-fat dairy products, and lean protein.  Do not eat a lot of foods high in solid fats, added sugars, or salt.  Maintain a Healthy Weight Regular exercise can help you achieve or maintain a healthy weight. You should:  Do at least 150 minutes of exercise each week. The exercise should increase your heart rate and make you sweat (moderate-intensity exercise).  Do strength-training exercises at least twice a week.  Watch Your Levels of Cholesterol and Blood Lipids  Have your blood tested for lipids and  cholesterol every 5 years starting at 79 years of age. If you are at high risk for heart disease, you should start having your blood tested when you are 79 years old. You may need to have your cholesterol levels checked more often if: ? Your lipid or cholesterol levels are high. ? You are older than 80 years of age. ? You are at high risk for heart disease.  What should I know about cancer screening? Many types of cancers can be detected early and may often be prevented. Lung Cancer  You should be screened every year for lung cancer if: ? You are a current smoker who has smoked for at least 30 years. ? You are a former smoker who has quit within the past 15 years.  Talk to your health care provider about your screening options, when you should start screening, and how often you should be screened.  Colorectal Cancer  Routine colorectal cancer screening usually begins at 79 years of age and should be repeated every 5-10 years until you are 79 years old. You may need to be screened more often if early forms of precancerous polyps or small growths are found. Your health care provider may recommend screening at an earlier age if you have risk factors for colon cancer.  Your health care provider may recommend using home test kits to check for hidden blood in the stool.  A small camera at the end of a tube can be used to  examine your colon (sigmoidoscopy or colonoscopy). This checks for the earliest forms of colorectal cancer.  Prostate and Testicular Cancer  Depending on your age and overall health, your health care provider may do certain tests to screen for prostate and testicular cancer.  Talk to your health care provider about any symptoms or concerns you have about testicular or prostate cancer.  Skin Cancer  Check your skin from head to toe regularly.  Tell your health care provider about any new moles or changes in moles, especially if: ? There is a change in a mole's size, shape,  or color. ? You have a mole that is larger than a pencil eraser.  Always use sunscreen. Apply sunscreen liberally and repeat throughout the day.  Protect yourself by wearing long sleeves, pants, a wide-brimmed hat, and sunglasses when outside.  What should I know about heart disease, diabetes, and high blood pressure?  If you are 57-35 years of age, have your blood pressure checked every 3-5 years. If you are 49 years of age or older, have your blood pressure checked every year. You should have your blood pressure measured twice-once when you are at a hospital or clinic, and once when you are not at a hospital or clinic. Record the average of the two measurements. To check your blood pressure when you are not at a hospital or clinic, you can use: ? An automated blood pressure machine at a pharmacy. ? A home blood pressure monitor.  Talk to your health care provider about your target blood pressure.  If you are between 60-66 years old, ask your health care provider if you should take aspirin to prevent heart disease.  Have regular diabetes screenings by checking your fasting blood sugar level. ? If you are at a normal weight and have a low risk for diabetes, have this test once every three years after the age of 56. ? If you are overweight and have a high risk for diabetes, consider being tested at a younger age or more often.  A one-time screening for abdominal aortic aneurysm (AAA) by ultrasound is recommended for men aged 38-75 years who are current or former smokers. What should I know about preventing infection? Hepatitis B If you have a higher risk for hepatitis B, you should be screened for this virus. Talk with your health care provider to find out if you are at risk for hepatitis B infection. Hepatitis C Blood testing is recommended for:  Everyone born from [birth year] through 1967.  Anyone with known risk factors for hepatitis C.  Sexually Transmitted Diseases (STDs)  You should  be screened each year for STDs including gonorrhea and chlamydia if: ? You are sexually active and are younger than 79 years of age. ? You are older than 79 years of age and your health care provider tells you that you are at risk for this type of infection. ? Your sexual activity has changed since you were last screened and you are at an increased risk for chlamydia or gonorrhea. Ask your health care provider if you are at risk.  Talk with your health care provider about whether you are at high risk of being infected with HIV. Your health care provider may recommend a prescription medicine to help prevent HIV infection.  What else can I do?  Schedule regular health, dental, and eye exams.  Stay current with your vaccines (immunizations).  Do not use any tobacco products, such as cigarettes, chewing tobacco, and e-cigarettes. If you need  help quitting, ask your health care provider.  Limit alcohol intake to no more than 2 drinks per day. One drink equals 12 ounces of beer, 5 ounces of wine, or 1 ounces of hard liquor.  Do not use street drugs.  Do not share needles.  Ask your health care provider for help if you need support or information about quitting drugs.  Tell your health care provider if you often feel depressed.  Tell your health care provider if you have ever been abused or do not feel safe at home. This information is not intended to replace advice given to you by your health care provider. Make sure you discuss any questions you have with your health care provider. Document Released: 09/09/2007 Document Revised: 11/10/2015 Document Reviewed: 12/15/2014 Elsevier Interactive Patient Education  [employer].

## 2017-05-14 ENCOUNTER — Encounter: Payer: Self-pay | Admitting: Family Medicine

## 2017-05-15 ENCOUNTER — Encounter: Payer: Self-pay | Admitting: Family Medicine

## 2017-05-16 NOTE — Telephone Encounter (Signed)
See labs 

## 2017-05-16 NOTE — Telephone Encounter (Signed)
CO2 in blood is normal We are happy to see him in the office if he is having trouble with sob/ stamina

## 2017-05-16 NOTE — Telephone Encounter (Signed)
co2 in blood was normal

## 2017-09-06 ENCOUNTER — Ambulatory Visit: Payer: Medicare Other | Admitting: Family Medicine

## 2017-09-07 ENCOUNTER — Ambulatory Visit (INDEPENDENT_AMBULATORY_CARE_PROVIDER_SITE_OTHER): Payer: Medicare Other | Admitting: Family Medicine

## 2017-09-07 ENCOUNTER — Encounter: Payer: Self-pay | Admitting: Family Medicine

## 2017-09-07 VITALS — BP 148/80 | HR 56 | Temp 97.6°F | Resp 16 | Wt 187.6 lb

## 2017-09-07 DIAGNOSIS — R351 Nocturia: Secondary | ICD-10-CM

## 2017-09-07 DIAGNOSIS — E785 Hyperlipidemia, unspecified: Secondary | ICD-10-CM

## 2017-09-07 DIAGNOSIS — R739 Hyperglycemia, unspecified: Secondary | ICD-10-CM | POA: Diagnosis not present

## 2017-09-07 DIAGNOSIS — E119 Type 2 diabetes mellitus without complications: Secondary | ICD-10-CM | POA: Diagnosis not present

## 2017-09-07 DIAGNOSIS — R1310 Dysphagia, unspecified: Secondary | ICD-10-CM

## 2017-09-07 DIAGNOSIS — I10 Essential (primary) hypertension: Secondary | ICD-10-CM

## 2017-09-07 DIAGNOSIS — D4620 Refractory anemia with excess of blasts, unspecified: Secondary | ICD-10-CM

## 2017-09-07 DIAGNOSIS — E039 Hypothyroidism, unspecified: Secondary | ICD-10-CM

## 2017-09-07 LAB — COMPREHENSIVE METABOLIC PANEL
ALK PHOS: 86 U/L (ref 39–117)
ALT: 17 U/L (ref 0–53)
AST: 21 U/L (ref 0–37)
Albumin: 4.3 g/dL (ref 3.5–5.2)
BILIRUBIN TOTAL: 0.5 mg/dL (ref 0.2–1.2)
BUN: 15 mg/dL (ref 6–23)
CALCIUM: 9.3 mg/dL (ref 8.4–10.5)
CHLORIDE: 101 meq/L (ref 96–112)
CO2: 32 meq/L (ref 19–32)
Creatinine, Ser: 1.05 mg/dL (ref 0.40–1.50)
GFR: 72.51 mL/min (ref >60.00)
Glucose, Bld: 96 mg/dL (ref 70–99)
Potassium: 4.3 mEq/L (ref 3.5–5.1)
Sodium: 140 mEq/L (ref 135–145)
Total Protein: 7.1 g/dL (ref 6.0–8.3)

## 2017-09-07 LAB — CBC WITH DIFFERENTIAL/PLATELET
BASOS PCT: 0.7 % (ref 0.0–3.0)
Basophils Absolute: 0 10*3/uL (ref 0.0–0.1)
EOS ABS: 0.1 10*3/uL (ref 0.0–0.7)
EOS PCT: 5 % (ref 0.0–5.0)
HEMATOCRIT: 34.5 % — AB (ref 39.0–52.0)
HEMOGLOBIN: 12 g/dL — AB (ref 13.0–17.0)
LYMPHS PCT: 40.8 % (ref 12.0–46.0)
Lymphs Abs: 0.9 10*3/uL (ref 0.7–4.0)
MCHC: 34.9 g/dL (ref 30.0–36.0)
MCV: 94.4 fl (ref 78.0–100.0)
MONOS PCT: 5 % (ref 3.0–12.0)
Monocytes Absolute: 0.1 10*3/uL (ref 0.1–1.0)
Neutro Abs: 1.1 10*3/uL — ABNORMAL LOW (ref 1.4–7.7)
Neutrophils Relative %: 48.5 % (ref 43.0–77.0)
Platelets: 169 10*3/uL (ref 150.0–400.0)
RBC: 3.66 Mil/uL — ABNORMAL LOW (ref 4.22–5.81)
RDW: 13.8 % (ref 11.5–15.5)

## 2017-09-07 LAB — LIPID PANEL
CHOL/HDL RATIO: 5
Cholesterol: 193 mg/dL (ref 0–200)
HDL: 39.9 mg/dL (ref >39.00)
LDL CALC: 127 mg/dL — AB (ref 0–99)
NONHDL: 153.58
TRIGLYCERIDES: 135 mg/dL (ref 0.0–149.0)
VLDL: 27 mg/dL (ref 0.0–40.0)

## 2017-09-07 LAB — HEMOGLOBIN A1C: HEMOGLOBIN A1C: 6.2 % (ref 4.6–6.5)

## 2017-09-07 LAB — PSA: PSA: 0.05 ng/mL — ABNORMAL LOW (ref 0.10–4.00)

## 2017-09-07 LAB — TSH: TSH: 3.82 u[IU]/mL (ref 0.35–4.50)

## 2017-09-07 MED ORDER — ENALAPRIL MALEATE 10 MG PO TABS: 10.0000 mg | ORAL_TABLET | Freq: Every day | ORAL | 1 refills | Status: DC

## 2017-09-07 MED ORDER — FUROSEMIDE 20 MG PO TABS: 20.0000 mg | ORAL_TABLET | Freq: Every day | ORAL | 3 refills | Status: DC

## 2017-09-07 NOTE — Assessment & Plan Note (Signed)
Tolerating statin, encouraged heart healthy diet, avoid trans fats, minimize simple carbs and saturated fats. Increase exercise as tolerated 

## 2017-09-07 NOTE — Assessment & Plan Note (Signed)
Well controlled, no changes to meds. Encouraged heart healthy diet such as the DASH diet and exercise as tolerated.  °

## 2017-09-07 NOTE — Assessment & Plan Note (Signed)
con't synthroid 

## 2017-09-07 NOTE — Assessment & Plan Note (Signed)
Check labs today [id number] to be checked , minimize simple carbs. Increase exercise as tolerated. Continue current meds  

## 2017-09-07 NOTE — Progress Notes (Signed)
Subjective:  I acted as a Education administrator for [employer]. Yancey Flemings, [place]   Patient ID: Trevor Cooke, male    DOB: Jul 01, 1940, 77 y.o.   MRN: [id number]  Chief Complaint  Patient presents with  . Hypertension  . Hypothyroidism    HPI  Patient is in today for follow up on hypertension, and hypothyroidism.  Pt has mult complaints.  He c/o rash on arms that itch -- while he was in the sun mowing.  He tried otc cream which helped He also would like his labs including psa and thyroid.   He also c/o swollen glands that the dentist noticed.  He denies any fevers/ chills, congestion , uti symptoms.     Patient Care Team: Carollee Herter, Alferd Apa, DO as PCP - General (Family Medicine) Eduard Roux, MD (Gastroenterology) Renita Papa, MD as Referring Physician (Dermatology) Christiane Ha as Referring Physician (Optometry) Inda Castle, MD (Inactive) as Consulting Physician (Gastroenterology) Paulla Dolly Tamala Fothergill, DPM as Consulting Physician (Podiatry)   Past Medical History:  Diagnosis Date  . Arthritis    in neck  . CVA (cerebral infarction) 1996  . Hyperlipidemia   . Hypertension   . MDS (myelodysplastic syndrome), low grade ([place]) 04/26/2016  . Psoriasis     Past Surgical History:  Procedure Laterality Date  . CLEFT PALATE REPAIR  1940's   left     Family History  Problem Relation Age of Onset  . Hypertension Mother   . Hypertension Father   . Stroke Father   . Diabetes Sister   . Colon cancer Neg Hx   . Thyroid disease Neg Hx     Social History   Socioeconomic History  . Marital status: Married    Spouse name: Not on file  . Number of children: Not on file  . Years of education: Not on file  . Highest education level: Not on file  Occupational History  . Not on file  Social Needs  . Financial resource strain: Not on file  . Food insecurity:    Worry: Not on file    Inability: Not on file  . Transportation needs:    Medical: Not on file   Non-medical: Not on file  Tobacco Use  . Smoking status: Former Research scientist (life sciences)  . Smokeless tobacco: Never Used  Substance and Sexual Activity  . Alcohol use: Yes    Alcohol/week: 0.0 oz    Comment: occasional wine  . Drug use: No  . Sexual activity: Never  Lifestyle  . Physical activity:    Days per week: Not on file    Minutes per session: Not on file  . Stress: Not on file  Relationships  . Social connections:    Talks on phone: Not on file    Gets together: Not on file    Attends religious service: Not on file    Active member of club or organization: Not on file    Attends meetings of clubs or organizations: Not on file    Relationship status: Not on file  . Intimate partner violence:    Fear of current or ex partner: Not on file    Emotionally abused: Not on file    Physically abused: Not on file    Forced sexual activity: Not on file  Other Topics Concern  . Not on file  Social History Narrative  . Not on file    Outpatient Medications Prior to Visit  Medication Sig Dispense Refill  . aspirin 81  MG tablet Take 81 mg by mouth daily.    . calcitRIOL (ROCALTROL) 0.5 MCG capsule Take 0.5 mcg by mouth daily.    . Cholecalciferol (VITAMIN D) 2000 units CAPS Take by mouth.    . clobetasol cream (TEMOVATE) 2.13 % Apply 1 application topically 2 (two) times daily.    . fluocinonide (LIDEX) 0.05 % external solution Apply topically daily. Apply to scalp to treat affected areas 60 mL 5  . glucose blood (FREESTYLE PRECISION NEO TEST) test strip Use as instructed to check blood sugar once a day. DX  E11.9 100 each 3  . labetalol (NORMODYNE) 100 MG tablet Take 1 tablet (100 mg total) by mouth 2 (two) times daily. 180 tablet 3  . levothyroxine (SYNTHROID, LEVOTHROID) 50 MCG tablet Take 1 tablet (50 mcg total) by mouth daily. 90 tablet 3  . Multiple Vitamins-Minerals (MULTIVITAMIN WITH MINERALS) tablet Take by mouth daily.    Marland Kitchen NIFEdipine (PROCARDIA XL/ADALAT-CC) 60 MG 24 hr tablet Take 1  tablet (60 mg total) by mouth daily. 90 tablet 3  . ranitidine (ZANTAC) 150 MG tablet Take 150 mg by mouth 2 (two) times daily.    . Selenium 200 MCG TABS Take 200 mcg by mouth daily.    . Zinc 50 MG CAPS Take by mouth.    . enalapril (VASOTEC) 10 MG tablet Take 1 tablet (10 mg total) by mouth daily. 90 tablet 1   No facility-administered medications prior to visit.     No Known Allergies  Review of Systems  Constitutional: Negative for chills, fever and malaise/fatigue.  HENT: Negative for congestion and hearing loss.   Eyes: Negative for discharge.  Respiratory: Negative for cough, sputum production and shortness of breath.   Cardiovascular: Negative for chest pain, palpitations and leg swelling.  Gastrointestinal: Negative for abdominal pain, blood in stool, constipation, diarrhea, heartburn, nausea and vomiting.  Genitourinary: Negative for dysuria, frequency, hematuria and urgency.  Musculoskeletal: Negative for back pain, falls and myalgias.  Skin: Negative for rash.  Neurological: Negative for dizziness, sensory change, loss of consciousness, weakness and headaches.  Endo/Heme/Allergies: Negative for environmental allergies. Does not bruise/bleed easily.  Psychiatric/Behavioral: Negative for depression and suicidal ideas. The patient is not nervous/anxious and does not have insomnia.        Objective:    Physical Exam  Constitutional: He is oriented to person, place, and time. Vital signs are normal. He appears well-developed and well-nourished. He is sleeping.  HENT:  Head: Normocephalic and atraumatic.  Mouth/Throat: Oropharynx is clear and moist.  Eyes: Pupils are equal, round, and reactive to light. EOM are normal.  Neck: Normal range of motion. Neck supple. No thyromegaly present.  Cardiovascular: Normal rate and regular rhythm.  No murmur heard. Pulmonary/Chest: Effort normal and breath sounds normal. No respiratory distress. He has no wheezes. He has no rales. He  exhibits no tenderness.  Musculoskeletal: He exhibits no edema or tenderness.  Neurological: He is alert and oriented to person, place, and time.  Skin: Skin is warm and dry.  Psychiatric: He has a normal mood and affect. His behavior is normal. Judgment and thought content normal.  Nursing note and vitals reviewed.   BP (!) 148/80 (BP Location: Left Arm, Patient Position: Sitting, Cuff Size: Normal)   Pulse (!) 56   Temp 97.6 F (36.4 C) (Oral)   Resp 16   Wt 187 lb 9.6 oz (85.1 kg)   SpO2 97%   BMI 28.52 kg/m  Wt Readings from Last 3 Encounters:  09/07/17 187 lb 9.6 oz (85.1 kg)  05/10/17 189 lb 12.8 oz (86.1 kg)  04/10/17 188 lb 4 oz (85.4 kg)   BP Readings from Last 3 Encounters:  09/07/17 (!) 148/80  05/10/17 120/62  04/10/17 131/80     Immunization History  Administered Date(s) Administered  . H1N1 04/21/2008  . Influenza Split 01/30/2012  . Influenza Whole 12/28/2003, 01/02/2008, 12/29/2009  . Influenza, High Dose Seasonal PF 01/27/2013, 01/30/2014, 01/28/2015, 01/27/2016, 01/25/2017  . Pneumococcal Conjugate-13 04/07/2013  . Pneumococcal Polysaccharide-23 04/12/2010  . Td 09/25/1995  . Tdap 10/21/2010  . Zoster 01/30/2012    Health Maintenance  Topic Date Due  . HEMOGLOBIN A1C  03/06/2017  . OPHTHALMOLOGY EXAM  10/10/2017  . INFLUENZA VACCINE  10/25/2017  . FOOT EXAM  03/08/2018  . TETANUS/TDAP  10/20/2020  . PNA vac Low Risk Adult  Completed    Lab Results  Component Value Date   WBC 3.3 (L) 04/10/2017   HGB 12.2 (L) 04/10/2017   HCT 35.7 (L) 04/10/2017   PLT 186 04/10/2017   GLUCOSE 114 (H) 05/10/2017   CHOL 183 05/10/2017   TRIG 123.0 05/10/2017   HDL 42.80 05/10/2017   LDLCALC 116 (H) 05/10/2017   ALT 16 05/10/2017   AST 20 05/10/2017   NA 137 05/10/2017   K 4.1 05/10/2017   CL 99 05/10/2017   CREATININE 1.16 05/10/2017   BUN 16 05/10/2017   CO2 32 05/10/2017   TSH 4.57 (H) 09/04/2016   PSA 0.45 09/05/2013   HGBA1C 6.2 09/04/2016    MICROALBUR <0.7 08/27/2014    Lab Results  Component Value Date   TSH 4.57 (H) 09/04/2016   Lab Results  Component Value Date   WBC 3.3 (L) 04/10/2017   HGB 12.2 (L) 04/10/2017   HCT 35.7 (L) 04/10/2017   MCV 96.0 04/10/2017   PLT 186 04/10/2017   Lab Results  Component Value Date   NA 137 05/10/2017   K 4.1 05/10/2017   CO2 32 05/10/2017   GLUCOSE 114 (H) 05/10/2017   BUN 16 05/10/2017   CREATININE 1.16 05/10/2017   BILITOT 0.5 05/10/2017   ALKPHOS 86 05/10/2017   AST 20 05/10/2017   ALT 16 05/10/2017   PROT 7.6 05/10/2017   ALBUMIN 4.2 05/10/2017   CALCIUM 9.2 05/10/2017   GFR 64.69 05/10/2017   Lab Results  Component Value Date   CHOL 183 05/10/2017   Lab Results  Component Value Date   HDL 42.80 05/10/2017   Lab Results  Component Value Date   LDLCALC 116 (H) 05/10/2017   Lab Results  Component Value Date   TRIG 123.0 05/10/2017   Lab Results  Component Value Date   CHOLHDL 4 05/10/2017   Lab Results  Component Value Date   HGBA1C 6.2 09/04/2016         Assessment & Plan:   Problem List Items Addressed This Visit      Unprioritized   Controlled type 2 diabetes mellitus without complication ([place])    Check labs today hgba1c to be checked, minimize simple carbs. Increase exercise as tolerated. Continue current meds       Relevant Medications   enalapril (VASOTEC) 10 MG tablet   Essential hypertension    Well controlled, no changes to meds. Encouraged heart healthy diet such as the DASH diet and exercise as tolerated.       Relevant Medications   enalapril (VASOTEC) 10 MG tablet   furosemide (LASIX) 20 MG tablet   Other Relevant  Orders   PSA   CBC with Differential/Platelet   Lipid panel   Comprehensive metabolic panel   Hyperlipidemia    Tolerating statin, encouraged heart healthy diet, avoid trans fats, minimize simple carbs and saturated fats. Increase exercise as tolerated      Relevant Medications   enalapril (VASOTEC) 10  MG tablet   furosemide (LASIX) 20 MG tablet   Hypothyroidism    con't synthroid      Relevant Orders   TSH   MDS (myelodysplastic syndrome), low grade (H[facility]) (Chronic)    Per heme/onc       Other Visit Diagnoses    Nocturia    -  Primary   Relevant Orders   PSA   Hyperglycemia       Relevant Orders   Comprehensive metabolic panel   Hemoglobin A1c   Dysphagia, unspecified type       Relevant Orders   Ambulatory referral to Gastroenterology      I am having Truddie Crumble H. Tenorio start on furosemide. I am also having him maintain his aspirin, fluocinonide, multivitamin with minerals, Zinc, Vitamin D, ranitidine, labetalol, NIFEdipine, levothyroxine, clobetasol cream, calcitRIOL, Selenium, glucose blood, and enalapril.  Meds ordered this encounter  Medications  . enalapril (VASOTEC) 10 MG tablet    Sig: Take 1 tablet (10 mg total) by mouth daily.    Dispense:  90 tablet    Refill:  1  . furosemide (LASIX) 20 MG tablet    Sig: Take 1 tablet (20 mg total) by mouth daily.    Dispense:  90 tablet    Refill:  3    CMA served as scribe during this visit. History, Physical and Plan performed by medical provider. Documentation and orders reviewed and attested to.  [name], DO

## 2017-09-07 NOTE — Patient Instructions (Signed)

## 2017-09-07 NOTE — Assessment & Plan Note (Signed)
Per heme-onc. ?

## 2017-09-12 ENCOUNTER — Other Ambulatory Visit: Payer: Self-pay | Admitting: *Deleted

## 2017-09-12 DIAGNOSIS — I10 Essential (primary) hypertension: Secondary | ICD-10-CM

## 2017-09-12 DIAGNOSIS — E785 Hyperlipidemia, unspecified: Secondary | ICD-10-CM

## 2017-10-01 ENCOUNTER — Other Ambulatory Visit: Payer: Self-pay

## 2017-10-01 ENCOUNTER — Encounter: Payer: Self-pay | Admitting: Hematology & Oncology

## 2017-10-01 ENCOUNTER — Inpatient Hospital Stay (HOSPITAL_BASED_OUTPATIENT_CLINIC_OR_DEPARTMENT_OTHER): Payer: Medicare Other | Admitting: Hematology & Oncology

## 2017-10-01 ENCOUNTER — Inpatient Hospital Stay: Payer: Medicare Other | Attending: Hematology & Oncology

## 2017-10-01 VITALS — BP 138/68 | HR 47 | Temp 97.5°F | Resp 19 | Wt 189.0 lb

## 2017-10-01 DIAGNOSIS — D72819 Decreased white blood cell count, unspecified: Secondary | ICD-10-CM | POA: Diagnosis not present

## 2017-10-01 DIAGNOSIS — D4620 Refractory anemia with excess of blasts, unspecified: Secondary | ICD-10-CM | POA: Diagnosis not present

## 2017-10-01 DIAGNOSIS — Z7982 Long term (current) use of aspirin: Secondary | ICD-10-CM | POA: Diagnosis not present

## 2017-10-01 DIAGNOSIS — Z79899 Other long term (current) drug therapy: Secondary | ICD-10-CM | POA: Insufficient documentation

## 2017-10-01 DIAGNOSIS — D469 Myelodysplastic syndrome, unspecified: Secondary | ICD-10-CM | POA: Insufficient documentation

## 2017-10-01 LAB — LACTATE DEHYDROGENASE: LDH: 254 U/L — AB (ref 98–192)

## 2017-10-01 LAB — CBC WITH DIFFERENTIAL (CANCER CENTER ONLY)
BASOS ABS: 0 10*3/uL (ref 0.0–0.1)
BASOS PCT: 1 %
EOS ABS: 0.1 10*3/uL (ref 0.0–0.5)
EOS PCT: 6 %
HCT: 33.8 % — ABNORMAL LOW (ref 38.7–49.9)
Hemoglobin: 11.5 g/dL — ABNORMAL LOW (ref 13.0–17.1)
LYMPHS PCT: 42 %
Lymphs Abs: 0.8 10*3/uL — ABNORMAL LOW (ref 0.9–3.3)
MCH: 32.7 pg (ref 28.0–33.4)
MCHC: 34 g/dL (ref 32.0–35.9)
MCV: 96 fL (ref 82.0–98.0)
MONO ABS: 0.1 10*3/uL (ref 0.1–0.9)
MONOS PCT: 7 %
NEUTROS ABS: 0.9 10*3/uL — AB (ref 1.5–6.5)
NEUTROS PCT: 44 %
PLATELETS: 165 10*3/uL (ref 145–400)
RBC: 3.52 MIL/uL — ABNORMAL LOW (ref 4.20–5.70)
RDW: 13.2 % (ref 11.1–15.7)
WBC Count: 2 10*3/uL — ABNORMAL LOW (ref 4.0–10.0)

## 2017-10-01 LAB — FERRITIN: Ferritin: 165 ng/mL (ref 24–336)

## 2017-10-01 LAB — RETICULOCYTES
RBC.: 3.57 MIL/uL — AB (ref 4.20–5.82)
RETIC COUNT ABSOLUTE: 46.4 10*3/uL (ref 34.8–93.9)
RETIC CT PCT: 1.3 % (ref 0.8–1.8)

## 2017-10-01 LAB — IRON AND TIBC
Iron: 73 ug/dL (ref 42–163)
SATURATION RATIOS: 26 % — AB (ref 42–163)
TIBC: 281 ug/dL (ref 202–409)
UIBC: 208 ug/dL

## 2017-10-01 LAB — CMP (CANCER CENTER ONLY)
ALBUMIN: 4.2 g/dL (ref 3.5–5.0)
ALT: 22 U/L (ref 0–44)
AST: 26 U/L (ref 15–41)
Alkaline Phosphatase: 84 U/L (ref 38–126)
Anion gap: 6 (ref 5–15)
BUN: 13 mg/dL (ref 8–23)
CHLORIDE: 103 mmol/L (ref 98–111)
CO2: 31 mmol/L (ref 22–32)
Calcium: 9.4 mg/dL (ref 8.9–10.3)
Creatinine: 1.12 mg/dL (ref 0.61–1.24)
GFR, Est AFR Am: >60 mL/min (ref >60)
GLUCOSE: 105 mg/dL — AB (ref 70–99)
POTASSIUM: 3.9 mmol/L (ref 3.5–5.1)
SODIUM: 140 mmol/L (ref 135–145)
TOTAL PROTEIN: 7.4 g/dL (ref 6.5–8.1)
Total Bilirubin: 0.4 mg/dL (ref 0.3–1.2)

## 2017-10-01 LAB — TECHNOLOGIST SMEAR REVIEW

## 2017-10-01 NOTE — Progress Notes (Signed)
Hematology and Oncology Follow Up Visit  Trevor Cooke [id number] [birth year] 77 y.o. 10/01/2017   Principle Diagnosis:   Myelodysplastic syndrome-low-grade (IPSS-R score = 1) - tp53+  Current Therapy:    Observation     Interim History:  [name] is back for follow-up.  Unfortunately, his wife fell over the weekend.  She fell onto her face.  She has a fairly significant abrasion on the bridge of her nose and upper lip area.  Thankfully, she did not break any bones.  He has been doing okay.  He says his blood pressure is gone up.  He says his cholesterol is also going up.  He has had no fever.  He has had no bleeding.  He has had no rashes.  He does have a little bit of psoriasis.  There is been no change in his medications.  He does state that he is back on to Lasix.    Overall, his performance status is ECOG 1.   Medications:  Current Outpatient Medications:  .  aspirin 81 MG tablet, Take 81 mg by mouth daily., Disp: , Rfl:  .  calcitRIOL (ROCALTROL) 0.5 MCG capsule, Take 0.5 mcg by mouth daily., Disp: , Rfl:  .  Cholecalciferol (VITAMIN D) 2000 units CAPS, Take by mouth., Disp: , Rfl:  .  clobetasol cream (TEMOVATE) 7.93 %, Apply 1 application topically 2 (two) times daily., Disp: , Rfl:  .  enalapril (VASOTEC) 10 MG tablet, Take 1 tablet (10 mg total) by mouth daily., Disp: 90 tablet, Rfl: 1 .  fluocinonide (LIDEX) 0.05 % external solution, Apply topically daily. Apply to scalp to treat affected areas, Disp: 60 mL, Rfl: 5 .  furosemide (LASIX) 20 MG tablet, Take 1 tablet (20 mg total) by mouth daily., Disp: 90 tablet, Rfl: 3 .  glucose blood (FREESTYLE PRECISION NEO TEST) test strip, Use as instructed to check blood sugar once a day. DX  E11.9, Disp: 100 each, Rfl: 3 .  labetalol (NORMODYNE) 100 MG tablet, Take 1 tablet (100 mg total) by mouth 2 (two) times daily., Disp: 180 tablet, Rfl: 3 .  levothyroxine (SYNTHROID, LEVOTHROID) 50 MCG tablet, Take 1 tablet (50 mcg  total) by mouth daily., Disp: 90 tablet, Rfl: 3 .  Multiple Vitamins-Minerals (MULTIVITAMIN WITH MINERALS) tablet, Take by mouth daily., Disp: , Rfl:  .  NIFEdipine (PROCARDIA XL/ADALAT-CC) 60 MG 24 hr tablet, Take 1 tablet (60 mg total) by mouth daily., Disp: 90 tablet, Rfl: 3 .  ranitidine (ZANTAC) 150 MG tablet, Take 150 mg by mouth 2 (two) times daily., Disp: , Rfl:  .  Selenium 200 MCG TABS, Take 200 mcg by mouth daily., Disp: , Rfl:  .  Zinc 50 MG CAPS, Take by mouth., Disp: , Rfl:   Allergies: No Known Allergies  Past Medical History, Surgical history, Social history, and Family History were reviewed and updated.  Review of Systems: Review of Systems  Constitutional: Negative.   HENT: Negative.   Eyes: Negative.   Respiratory: Negative.   Cardiovascular: Negative.   Gastrointestinal: Negative.   Genitourinary: Negative.   Musculoskeletal: Negative.   Skin: Negative.   Neurological: Negative.   Endo/Heme/Allergies: Negative.   Psychiatric/Behavioral: Negative.      Physical Exam:  weight is 189 lb (85.7 kg). His oral temperature is 97.5 F (36.4 C) (abnormal). His blood pressure is 138/68 and his pulse is 47 (abnormal). His respiration is 19 and oxygen saturation is 96%.   Wt Readings from Last 3 Encounters:  10/01/17  189 lb (85.7 kg)  09/07/17 187 lb 9.6 oz (85.1 kg)  05/10/17 189 lb 12.8 oz (86.1 kg)     Physical Exam  Constitutional: He is oriented to person, place, and time.  HENT:  Head: Normocephalic and atraumatic.  Mouth/Throat: Oropharynx is clear and moist.  Eyes: Pupils are equal, round, and reactive to light. EOM are normal.  Neck: Normal range of motion.  Cardiovascular: Normal rate, regular rhythm and normal heart sounds.  Pulmonary/Chest: Effort normal and breath sounds normal.  Abdominal: Soft. Bowel sounds are normal.  Musculoskeletal: Normal range of motion. He exhibits no edema, tenderness or deformity.  Lymphadenopathy:    He has no  cervical adenopathy.  Neurological: He is alert and oriented to person, place, and time.  Skin: Skin is warm and dry. No rash noted. No erythema.  Psychiatric: He has a normal mood and affect. His behavior is normal. Judgment and thought content normal.  Vitals reviewed.    Lab Results  Component Value Date   WBC 2.0 (L) 10/01/2017   HGB 11.5 (L) 10/01/2017   HCT 33.8 (L) 10/01/2017   MCV 96.0 10/01/2017   PLT 165 10/01/2017     Chemistry      Component Value Date/Time   NA 140 09/07/2017 1116   NA 144 04/26/2016 1117   K 4.3 09/07/2017 1116   K 3.9 04/26/2016 1117   CL 101 09/07/2017 1116   CL 98 04/26/2016 1117   CO2 32 09/07/2017 1116   CO2 18 04/26/2016 1117   BUN 15 09/07/2017 1116   BUN 13 04/26/2016 1117   CREATININE 1.05 09/07/2017 1116   CREATININE 1.2 04/26/2016 1117      Component Value Date/Time   CALCIUM 9.3 09/07/2017 1116   CALCIUM 9.1 04/26/2016 1117   ALKPHOS 86 09/07/2017 1116   ALKPHOS 99 (H) 04/26/2016 1117   AST 21 09/07/2017 1116   AST 27 04/26/2016 1117   ALT 17 09/07/2017 1116   ALT 22 04/26/2016 1117   BILITOT 0.5 09/07/2017 1116   BILITOT 0.60 04/26/2016 1117         Impression and Plan: [name] is a 77 year old white male. He has myelodysplasia. This is manifested as leukopenia.   I looked at his blood under the microscope.  I did not see any immature myeloid cells.  He had no blasts.  His blood counts are little bit lower.  I want to make sure that we follow up closely with him.  As such, I want to get him back in 6 weeks.  Hopefully, their party that is coming up this weekend will be a success with the food truck catering their party.  Volanda Napoleon, MD 7/8/20199:36 AM

## 2017-10-09 ENCOUNTER — Ambulatory Visit: Payer: Medicare Other | Admitting: Hematology & Oncology

## 2017-10-09 ENCOUNTER — Other Ambulatory Visit: Payer: Medicare Other

## 2017-10-16 ENCOUNTER — Telehealth: Payer: Self-pay | Admitting: Gastroenterology

## 2017-10-16 ENCOUNTER — Encounter: Payer: Self-pay | Admitting: Gastroenterology

## 2017-10-16 NOTE — Telephone Encounter (Signed)
Can make appointment in my clinic If he needs to be seen earlier, can make appointment in APP clinic.

## 2017-10-16 NOTE — Telephone Encounter (Signed)
Pt is scheduled for 11-30-17 @ 1:45.  Paperwork mailed

## 2017-11-12 ENCOUNTER — Inpatient Hospital Stay: Payer: Medicare Other

## 2017-11-12 ENCOUNTER — Other Ambulatory Visit: Payer: Self-pay

## 2017-11-12 ENCOUNTER — Inpatient Hospital Stay: Payer: Medicare Other | Attending: Hematology & Oncology | Admitting: Hematology & Oncology

## 2017-11-12 ENCOUNTER — Encounter: Payer: Self-pay | Admitting: Hematology & Oncology

## 2017-11-12 VITALS — BP 112/65 | HR 60 | Temp 97.5°F | Resp 18 | Wt 188.8 lb

## 2017-11-12 DIAGNOSIS — Z79899 Other long term (current) drug therapy: Secondary | ICD-10-CM | POA: Diagnosis not present

## 2017-11-12 DIAGNOSIS — Z7982 Long term (current) use of aspirin: Secondary | ICD-10-CM | POA: Insufficient documentation

## 2017-11-12 DIAGNOSIS — D4620 Refractory anemia with excess of blasts, unspecified: Secondary | ICD-10-CM | POA: Diagnosis not present

## 2017-11-12 DIAGNOSIS — D72819 Decreased white blood cell count, unspecified: Secondary | ICD-10-CM | POA: Insufficient documentation

## 2017-11-12 LAB — SAVE SMEAR

## 2017-11-12 LAB — CMP (CANCER CENTER ONLY)
ALBUMIN: 4.1 g/dL (ref 3.5–5.0)
ALT: 20 U/L (ref 0–44)
AST: 23 U/L (ref 15–41)
Alkaline Phosphatase: 92 U/L (ref 38–126)
Anion gap: 7 (ref 5–15)
BUN: 16 mg/dL (ref 8–23)
CO2: 30 mmol/L (ref 22–32)
Calcium: 9.1 mg/dL (ref 8.9–10.3)
Chloride: 102 mmol/L (ref 98–111)
Creatinine: 1.04 mg/dL (ref 0.61–1.24)
GFR, Est AFR Am: >60 mL/min (ref >60)
GFR, Estimated: >60 mL/min (ref >60)
GLUCOSE: 99 mg/dL (ref 70–99)
POTASSIUM: 3.9 mmol/L (ref 3.5–5.1)
Sodium: 139 mmol/L (ref 135–145)
Total Bilirubin: 0.5 mg/dL (ref 0.3–1.2)
Total Protein: 7.6 g/dL (ref 6.5–8.1)

## 2017-11-12 LAB — FERRITIN: Ferritin: 175 ng/mL (ref 24–336)

## 2017-11-12 LAB — IRON AND TIBC
Iron: 51 ug/dL (ref 42–163)
SATURATION RATIOS: 19 % — AB (ref 42–163)
TIBC: 275 ug/dL (ref 202–409)
UIBC: 224 ug/dL

## 2017-11-12 LAB — CBC WITH DIFFERENTIAL (CANCER CENTER ONLY)
BASOS PCT: 0 %
Basophils Absolute: 0 10*3/uL (ref 0.0–0.1)
EOS ABS: 0.1 10*3/uL (ref 0.0–0.5)
EOS PCT: 3 %
HCT: 34.8 % — ABNORMAL LOW (ref 38.7–49.9)
Hemoglobin: 11.8 g/dL — ABNORMAL LOW (ref 13.0–17.1)
LYMPHS ABS: 1 10*3/uL (ref 0.9–3.3)
Lymphocytes Relative: 33 %
MCH: 32.8 pg (ref 28.0–33.4)
MCHC: 33.9 g/dL (ref 32.0–35.9)
MCV: 96.7 fL (ref 82.0–98.0)
Monocytes Absolute: 0.2 10*3/uL (ref 0.1–0.9)
Monocytes Relative: 6 %
Neutro Abs: 1.8 10*3/uL (ref 1.5–6.5)
Neutrophils Relative %: 58 %
PLATELETS: 164 10*3/uL (ref 145–400)
RBC: 3.6 MIL/uL — AB (ref 4.20–5.70)
RDW: 13.2 % (ref 11.1–15.7)
WBC: 3 10*3/uL — AB (ref 4.0–10.0)

## 2017-11-12 LAB — RETICULOCYTES
RBC.: 3.65 MIL/uL — AB (ref 4.20–5.82)
Retic Count, Absolute: 54.8 10*3/uL (ref 34.8–93.9)
Retic Ct Pct: 1.5 % (ref 0.8–1.8)

## 2017-11-12 NOTE — Progress Notes (Signed)
Hematology and Oncology Follow Up Visit  Trevor Cooke [id number] 1940/05/25 77 y.o. 11/12/2017   Principle Diagnosis:   Myelodysplastic syndrome-low-grade (IPSS-R score = 1) - tp53+  Current Therapy:    Observation     Interim History:  Trevor Cooke is back for follow-up.  He is doing pretty well.  He and his wife had a very nice anniversary party.  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  This really worked out well for them.  His wife is doing well.  Last time we saw them, she had fallen and had a bruise on her face.  He has had no fever.  He does have psoriasis.  He is worried that the Vasotec that he is on is causing his low white cell count.  I told him that I do not think that that was the case.  However, I would told him that he should talk to his family doctor and may be switched the Vasotec or something different.  He has had no nausea or vomiting.  He did have a little bit of diarrhea over the weekend.  Overall, his performance status is ECOG 1.   Medications:  Current Outpatient Medications:  .  aspirin 81 MG tablet, Take 81 mg by mouth daily., Disp: , Rfl:  .  calcitRIOL (ROCALTROL) 0.5 MCG capsule, Take 0.5 mcg by mouth daily., Disp: , Rfl:  .  Cholecalciferol (VITAMIN D) 2000 units CAPS, Take by mouth., Disp: , Rfl:  .  clobetasol cream (TEMOVATE) 6.06 %, Apply 1 application topically 2 (two) times daily., Disp: , Rfl:  .  enalapril (VASOTEC) 10 MG tablet, Take 1 tablet (10 mg total) by mouth daily., Disp: 90 tablet, Rfl: 1 .  fluocinonide (LIDEX) 0.05 % external solution, Apply topically daily. Apply to scalp to treat affected areas, Disp: 60 mL, Rfl: 5 .  glucose blood (FREESTYLE PRECISION NEO TEST) test strip, Use as instructed to check blood sugar once a Cooke. DX  E11.9, Disp: 100 each, Rfl: 3 .  labetalol (NORMODYNE) 100 MG tablet, Take 1 tablet (100 mg total) by mouth 2 (two) times daily., Disp: 180 tablet, Rfl: 3 .  levothyroxine (SYNTHROID, LEVOTHROID) 50 MCG tablet,  Take 1 tablet (50 mcg total) by mouth daily., Disp: 90 tablet, Rfl: 3 .  Multiple Vitamins-Minerals (MULTIVITAMIN WITH MINERALS) tablet, Take by mouth daily., Disp: , Rfl:  .  NIFEdipine (PROCARDIA XL/ADALAT-CC) 60 MG 24 hr tablet, Take 1 tablet (60 mg total) by mouth daily., Disp: 90 tablet, Rfl: 3 .  Selenium 200 MCG TABS, Take 200 mcg by mouth daily., Disp: , Rfl:  .  Zinc 50 MG CAPS, Take by mouth., Disp: , Rfl:  .  furosemide (LASIX) 20 MG tablet, Take 1 tablet (20 mg total) by mouth daily. (Patient not taking: Reported on 11/12/2017), Disp: 90 tablet, Rfl: 3 .  ranitidine (ZANTAC) 150 MG tablet, Take 150 mg by mouth 2 (two) times daily., Disp: , Rfl:   Allergies: No Known Allergies  Past Medical History, Surgical history, Social history, and Family History were reviewed and updated.  Review of Systems: Review of Systems  Constitutional: Negative.   HENT: Negative.   Eyes: Negative.   Respiratory: Negative.   Cardiovascular: Negative.   Gastrointestinal: Negative.   Genitourinary: Negative.   Musculoskeletal: Negative.   Skin: Negative.   Neurological: Negative.   Endo/Heme/Allergies: Negative.   Psychiatric/Behavioral: Negative.      Physical Exam:  weight is 188 lb 12 oz (85.6 kg). His oral temperature is  97.5 F (36.4 C) (abnormal). His blood pressure is 112/65 and his pulse is 60. His respiration is 18 and oxygen saturation is 97%.   Wt Readings from Last 3 Encounters:  11/12/17 188 lb 12 oz (85.6 kg)  10/01/17 189 lb (85.7 kg)  09/07/17 187 lb 9.6 oz (85.1 kg)     Physical Exam  Constitutional: He is oriented to person, place, and time.  HENT:  Head: Normocephalic and atraumatic.  Mouth/Throat: Oropharynx is clear and moist.  Eyes: Pupils are equal, round, and reactive to light. EOM are normal.  Neck: Normal range of motion.  Cardiovascular: Normal rate, regular rhythm and normal heart sounds.  Pulmonary/Chest: Effort normal and breath sounds normal.    Abdominal: Soft. Bowel sounds are normal.  Musculoskeletal: Normal range of motion. He exhibits no edema, tenderness or deformity.  Lymphadenopathy:    He has no cervical adenopathy.  Neurological: He is alert and oriented to person, place, and time.  Skin: Skin is warm and dry. No rash noted. No erythema.  Psychiatric: He has a normal mood and affect. His behavior is normal. Judgment and thought content normal.  Vitals reviewed.    Lab Results  Component Value Date   WBC 3.0 (L) 11/12/2017   HGB 11.8 (L) 11/12/2017   HCT 34.8 (L) 11/12/2017   MCV 96.7 11/12/2017   PLT 164 11/12/2017     Chemistry      Component Value Date/Time   NA 140 10/01/2017 0838   NA 144 04/26/2016 1117   K 3.9 10/01/2017 0838   K 3.9 04/26/2016 1117   CL 103 10/01/2017 0838   CL 98 04/26/2016 1117   CO2 31 10/01/2017 0838   CO2 18 04/26/2016 1117   BUN 13 10/01/2017 0838   BUN 13 04/26/2016 1117   CREATININE 1.12 10/01/2017 0838   CREATININE 1.2 04/26/2016 1117      Component Value Date/Time   CALCIUM 9.4 10/01/2017 0838   CALCIUM 9.1 04/26/2016 1117   ALKPHOS 84 10/01/2017 0838   ALKPHOS 99 (H) 04/26/2016 1117   AST 26 10/01/2017 0838   ALT 22 10/01/2017 0838   ALT 22 04/26/2016 1117   BILITOT 0.4 10/01/2017 0838         Impression and Plan: Trevor Cooke is a 77 year old white male. He has myelodysplasia. This is manifested as leukopenia.   I looked at his blood under the microscope.  I did not see any immature myeloid cells.  He had no blasts.  I am happy that his white cell count is a little bit better.  We will go ahead and plan to get him back after Thanksgiving.  I think we can wait until early December.    Volanda Napoleon, MD 8/19/20199:10 AM

## 2017-11-13 LAB — ERYTHROPOIETIN: Erythropoietin: 25.9 m[IU]/mL — ABNORMAL HIGH (ref 2.6–18.5)

## 2017-11-30 ENCOUNTER — Ambulatory Visit (INDEPENDENT_AMBULATORY_CARE_PROVIDER_SITE_OTHER): Payer: Medicare Other | Admitting: Gastroenterology

## 2017-11-30 ENCOUNTER — Encounter: Payer: Self-pay | Admitting: Gastroenterology

## 2017-11-30 VITALS — BP 120/72 | HR 67 | Ht 68.0 in | Wt 191.2 lb

## 2017-11-30 DIAGNOSIS — K219 Gastro-esophageal reflux disease without esophagitis: Secondary | ICD-10-CM

## 2017-11-30 DIAGNOSIS — R1310 Dysphagia, unspecified: Secondary | ICD-10-CM | POA: Diagnosis not present

## 2017-11-30 NOTE — Progress Notes (Signed)
Chief Complaint: Dysphagia  Referring Provider:  Carollee Herter, Alferd Apa, *      ASSESSMENT AND PLAN;   #1.  Esophageal dysphagia.  EGD by Dr. Nyoka Cowden 12/2014 showing 5 cm hiatal hernia, mild gastritis. Had manometry and pH 2016 at [facility]. D/d includes esophageal stricture, Schatzki's ring, motility disorder, eosinophilic esophagitis, pill induced esophagitis, rule out esophageal carcinoma or extrinsic lesions.  Plan: - I have instructed patient that he needs to chew foods especially meats and breads well and eat slowly. - Proceed with barium swallow with barium tablet. -Thereafter, proceed with a EGD with esophageal dilatation if needed.  Have discussed risks and benefits.   #2.  Gastroesophageal reflux with hiatal hernia - Restart Zantac 150mg  bid - Would prefer omeprazole but pt reluctant to resume omeprazole since he has history of leukopenia.  He is being followed by Dr. Burney Gauze.   HPI:    Trevor Cooke is a 77 y.o. male  With dysphagia x several years, getting worse over the last 1 year Intermittent Mostly to solids Occasional vomiting Has occasional heartburn Diagnosed with hiatal hernia Has been on omeprazole but stopped due to concerns regarding neutropenia. Did take Zantac once a day which did not help much. Had EGD by Dr. Deatra Ina in 2002 with esophageal dilatation which did help. Denies having any significant lower GI complaints except for occasional diarrhea especially after eating-long-standing problem, no constipation No melena or hematochezia No recent weight loss  Past GI procedures: -Colonoscopy 03/06/2013 showing moderate left colonic diverticulosis.  Otherwise normal.  History of adenomatous polyps 2000, 2004.  No need to repeat colonoscopy (Dr. Deatra Ina) Past Medical History:  Diagnosis Date  . Arthritis    in neck  . CVA (cerebral infarction) 1996  . GERD (gastroesophageal reflux disease)   . Hyperlipidemia   . Hypertension   . MDS  (myelodysplastic syndrome), low grade ([place]) 04/26/2016  . Neutropenia ([place])   . Psoriasis   . Thyroid disease     Past Surgical History:  Procedure Laterality Date  . CLEFT PALATE REPAIR  1940's   left     Family History  Problem Relation Age of Onset  . Hypertension Mother   . Hypertension Father   . Stroke Father        died from it   . Diabetes Sister   . Cancer Maternal Uncle        all 4 had cancer. One was brain cancer  . Diabetes Maternal Uncle   . Colon cancer Neg Hx   . Thyroid disease Neg Hx     Social History   Tobacco Use  . Smoking status: Former Research scientist (life sciences)  . Smokeless tobacco: Never Used  . Tobacco comment: quit 1970  Substance Use Topics  . Alcohol use: Yes    Alcohol/week: 0.0 standard drinks    Comment: occasional wine  . Drug use: No    Current Outpatient Medications  Medication Sig Dispense Refill  . aspirin 81 MG tablet Take 81 mg by mouth daily.    . calcitRIOL (ROCALTROL) 0.5 MCG capsule Take 0.5 mcg by mouth daily.    . Cholecalciferol (VITAMIN D) 2000 units CAPS Take by mouth.    . clobetasol cream (TEMOVATE) 4.01 % Apply 1 application topically 2 (two) times daily.    . enalapril (VASOTEC) 10 MG tablet Take 1 tablet (10 mg total) by mouth daily. 90 tablet 1  . fluocinonide (LIDEX) 0.05 % external solution Apply topically daily. Apply to scalp to  treat affected areas 60 mL 5  . glucose blood (FREESTYLE PRECISION NEO TEST) test strip Use as instructed to check blood sugar once a day. DX  E11.9 100 each 3  . labetalol (NORMODYNE) 100 MG tablet Take 1 tablet (100 mg total) by mouth 2 (two) times daily. 180 tablet 3  . levothyroxine (SYNTHROID, LEVOTHROID) 50 MCG tablet Take 1 tablet (50 mcg total) by mouth daily. 90 tablet 3  . Multiple Vitamins-Minerals (MULTIVITAMIN WITH MINERALS) tablet Take by mouth daily.    Marland Kitchen NIFEdipine (PROCARDIA XL/ADALAT-CC) 60 MG 24 hr tablet Take 1 tablet (60 mg total) by mouth daily. 90 tablet 3  . Selenium 200 MCG TABS  Take 200 mcg by mouth daily.    . Zinc 50 MG CAPS Take by mouth.     No current facility-administered medications for this visit.     No Known Allergies  Review of Systems:  Constitutional: Denies fever, chills, diaphoresis, appetite change and fatigue.  HEENT: Denies photophobia, eye pain, redness, hearing loss, ear pain, congestion, sore throat, rhinorrhea, sneezing, mouth sores, neck pain, neck stiffness and tinnitus.   Respiratory: Denies SOB, DOE, cough, chest tightness,  and wheezing.   Cardiovascular: Denies chest pain, palpitations and leg swelling.  Genitourinary: Denies dysuria, urgency, frequency, hematuria, flank pain and difficulty urinating.  Musculoskeletal: Denies myalgias, back pain, joint swelling, arthralgias and gait problem.  Skin: No rash.  Neurological: Denies dizziness, seizures, syncope, weakness, light-headedness, numbness and headaches.  Hematological: Denies adenopathy. Easy bruising, personal or family bleeding history  Psychiatric/Behavioral: No anxiety or depression     Physical Exam:    BP 120/72   Pulse 67   Ht 5\' 8"  (1.727 m)   Wt 191 lb 4 oz (86.8 kg)   BMI 29.08 kg/m  Filed Weights   11/30/17 1343  Weight: 191 lb 4 oz (86.8 kg)   Constitutional:  Well-developed, in no acute distress. Psychiatric: Normal mood and affect. Behavior is normal. HEENT: Pupils normal.  Conjunctivae are normal. No scleral icterus. Neck supple.  Cardiovascular: Normal rate, regular rhythm. No edema Pulmonary/chest: Effort normal and breath sounds normal. No wheezing, rales or rhonchi. Abdominal: Soft, nondistended. Nontender. Bowel sounds active throughout. There are no masses palpable. No hepatomegaly. Rectal:  defered Neurological: Alert and oriented to person place and time. Skin: Skin is warm and dry. No rashes noted.  Data Reviewed: I have personally reviewed following labs and imaging studies  CBC: CBC Latest Ref Rng & Units 11/12/2017 10/01/2017 09/07/2017   WBC 4.0 - 10.0 K/uL 3.0(L) 2.0(L) 2.3 Repeated and verified X2.(L)  Hemoglobin 13.0 - 17.1 g/dL 11.8(L) 11.5(L) 12.0(L)  Hematocrit 38.7 - 49.9 % 34.8(L) 33.8(L) 34.5(L)  Platelets 145 - 400 K/uL 164 165 169.0    CMP: CMP Latest Ref Rng & Units 11/12/2017 10/01/2017 09/07/2017  Glucose 70 - 99 mg/dL 99 105(H) 96  BUN 8 - 23 mg/dL 16 13 15   Creatinine 0.61 - 1.24 mg/dL 1.04 1.12 1.05  Sodium 135 - 145 mmol/L 139 140 140  Potassium 3.5 - 5.1 mmol/L 3.9 3.9 4.3  Chloride 98 - 111 mmol/L 102 103 101  CO2 22 - 32 mmol/L 30 31 32  Calcium 8.9 - 10.3 mg/dL 9.1 9.4 9.3  Total Protein 6.5 - 8.1 g/dL 7.6 7.4 7.1  Total Bilirubin 0.3 - 1.2 mg/dL 0.5 0.4 0.5  Alkaline Phos 38 - 126 U/L 92 84 86  AST 15 - 41 U/L 23 26 21   ALT 0 - 44 U/L 20 22  Cliff, MD 11/30/2017, 2:03 PM  Cc: Carollee Herter, Alferd Apa, *

## 2017-11-30 NOTE — Patient Instructions (Signed)
If you are age 79 or older, your body mass index should be between 23-30. Your Body mass index is 29.08 kg/m. If this is out of the aforementioned range listed, please consider follow up with your Primary Care Provider.  If you are age 40 or younger, your body mass index should be between 19-25. Your Body mass index is 29.08 kg/m. If this is out of the aformentioned range listed, please consider follow up with your Primary Care Provider.   Please purchase the following medications over the counter and take as directed: Zantac 150 mg twice daily.   You have been scheduled for an endoscopy. Please follow written instructions given to you at your visit today. If you use inhalers (even only as needed), please bring them with you on the day of your procedure. Your physician has requested that you go to www.startemmi.com and enter the access code given to you at your visit today. This web site gives a general overview about your procedure. However, you should still follow specific instructions given to you by our office regarding your preparation for the procedure.  You have been scheduled for a Barium Esophogram at [facility] (1st floor of the hospital) on 12/11/17 at 11am. Please arrive 15 minutes prior to your appointment for registration. Make certain not to have anything to eat or drink 3 hours prior to your test. If you need to reschedule for any reason, please contact radiology at [phone] to do so. __________________________________________________________________ A barium swallow is an examination that concentrates on views of the esophagus. This tends to be a double contrast exam (barium and two liquids which, when combined, create a gas to distend the wall of the oesophagus) or single contrast (non-ionic iodine based). The study is usually tailored to your symptoms so a good history is essential. Attention is paid during the study to the form, structure and configuration of the  esophagus, looking for functional disorders (such as aspiration, dysphagia, achalasia, motility and reflux) EXAMINATION You may be asked to change into a gown, depending on the type of swallow being performed. A radiologist and radiographer will perform the procedure. The radiologist will advise you of the type of contrast selected for your procedure and direct you during the exam. You will be asked to stand, sit or lie in several different positions and to hold a small amount of fluid in your mouth before being asked to swallow while the imaging is performed .In some instances you may be asked to swallow barium coated marshmallows to assess the motility of a solid food bolus. The exam can be recorded as a digital or video fluoroscopy procedure. POST PROCEDURE It will take 1-2 days for the barium to pass through your system. To facilitate this, it is important, unless otherwise directed, to increase your fluids for the next 24-48hrs and to resume your normal diet.  This test typically takes about 30 minutes to perform. __________________________________________________________________________________  Please purchase the following medications over the counter and take as directed: Zantac 150 mg twice daily.   Thank you,  Dr. Jackquline Denmark

## 2017-12-10 DIAGNOSIS — H524 Presbyopia: Secondary | ICD-10-CM | POA: Diagnosis not present

## 2017-12-10 DIAGNOSIS — H25813 Combined forms of age-related cataract, bilateral: Secondary | ICD-10-CM | POA: Diagnosis not present

## 2017-12-10 DIAGNOSIS — H02889 Meibomian gland dysfunction of unspecified eye, unspecified eyelid: Secondary | ICD-10-CM | POA: Diagnosis not present

## 2017-12-11 ENCOUNTER — Ambulatory Visit (HOSPITAL_COMMUNITY): Payer: Medicare Other

## 2018-01-10 ENCOUNTER — Encounter: Payer: Medicare Other | Admitting: Gastroenterology

## 2018-01-21 DIAGNOSIS — L219 Seborrheic dermatitis, unspecified: Secondary | ICD-10-CM | POA: Diagnosis not present

## 2018-01-21 DIAGNOSIS — L400 Psoriasis vulgaris: Secondary | ICD-10-CM | POA: Diagnosis not present

## 2018-01-21 DIAGNOSIS — L853 Xerosis cutis: Secondary | ICD-10-CM | POA: Diagnosis not present

## 2018-01-21 DIAGNOSIS — L821 Other seborrheic keratosis: Secondary | ICD-10-CM | POA: Diagnosis not present

## 2018-01-21 DIAGNOSIS — L570 Actinic keratosis: Secondary | ICD-10-CM | POA: Diagnosis not present

## 2018-01-23 ENCOUNTER — Ambulatory Visit (INDEPENDENT_AMBULATORY_CARE_PROVIDER_SITE_OTHER): Payer: Medicare Other

## 2018-01-23 DIAGNOSIS — Z23 Encounter for immunization: Secondary | ICD-10-CM | POA: Diagnosis not present

## 2018-01-28 ENCOUNTER — Ambulatory Visit: Payer: Medicare Other

## 2018-02-25 ENCOUNTER — Inpatient Hospital Stay: Payer: Medicare Other

## 2018-02-25 ENCOUNTER — Inpatient Hospital Stay: Payer: Medicare Other | Attending: Hematology & Oncology | Admitting: Hematology & Oncology

## 2018-02-25 ENCOUNTER — Encounter: Payer: Self-pay | Admitting: Hematology & Oncology

## 2018-02-25 ENCOUNTER — Other Ambulatory Visit: Payer: Self-pay

## 2018-02-25 VITALS — BP 133/75 | HR 64 | Resp 18 | Wt 190.0 lb

## 2018-02-25 DIAGNOSIS — D4620 Refractory anemia with excess of blasts, unspecified: Secondary | ICD-10-CM

## 2018-02-25 DIAGNOSIS — Z79899 Other long term (current) drug therapy: Secondary | ICD-10-CM | POA: Insufficient documentation

## 2018-02-25 DIAGNOSIS — Z7982 Long term (current) use of aspirin: Secondary | ICD-10-CM

## 2018-02-25 DIAGNOSIS — D72819 Decreased white blood cell count, unspecified: Secondary | ICD-10-CM | POA: Diagnosis not present

## 2018-02-25 LAB — RETICULOCYTES
Immature Retic Fract: 5 % (ref 2.3–15.9)
RBC.: 3.72 MIL/uL — ABNORMAL LOW (ref 4.22–5.81)
RETIC COUNT ABSOLUTE: 55.1 10*3/uL (ref 19.0–186.0)
Retic Ct Pct: 1.5 % (ref 0.4–3.1)

## 2018-02-25 LAB — CBC WITH DIFFERENTIAL (CANCER CENTER ONLY)
ABS IMMATURE GRANULOCYTES: 0 10*3/uL (ref 0.00–0.07)
BASOS ABS: 0 10*3/uL (ref 0.0–0.1)
Basophils Relative: 0 %
Eosinophils Absolute: 0.1 10*3/uL (ref 0.0–0.5)
Eosinophils Relative: 5 %
HCT: 36.3 % — ABNORMAL LOW (ref 39.0–52.0)
Hemoglobin: 11.9 g/dL — ABNORMAL LOW (ref 13.0–17.0)
Immature Granulocytes: 0 %
Lymphocytes Relative: 48 %
Lymphs Abs: 1.2 10*3/uL (ref 0.7–4.0)
MCH: 32 pg (ref 26.0–34.0)
MCHC: 32.8 g/dL (ref 30.0–36.0)
MCV: 97.6 fL (ref 80.0–100.0)
MONO ABS: 0.1 10*3/uL (ref 0.1–1.0)
Monocytes Relative: 5 %
NEUTROS ABS: 1 10*3/uL — AB (ref 1.7–7.7)
NRBC: 0 % (ref 0.0–0.2)
Neutrophils Relative %: 42 %
PLATELETS: 175 10*3/uL (ref 150–400)
RBC: 3.72 MIL/uL — AB (ref 4.22–5.81)
RDW: 12.9 % (ref 11.5–15.5)
WBC: 2.5 10*3/uL — AB (ref 4.0–10.5)

## 2018-02-25 LAB — CMP (CANCER CENTER ONLY)
ALBUMIN: 4.4 g/dL (ref 3.5–5.0)
ALT: 20 U/L (ref 0–44)
ANION GAP: 8 (ref 5–15)
AST: 24 U/L (ref 15–41)
Alkaline Phosphatase: 73 U/L (ref 38–126)
BUN: 16 mg/dL (ref 8–23)
CO2: 31 mmol/L (ref 22–32)
Calcium: 9 mg/dL (ref 8.9–10.3)
Chloride: 101 mmol/L (ref 98–111)
Creatinine: 1.15 mg/dL (ref 0.61–1.24)
GFR, Estimated: >60 mL/min (ref >60)
GLUCOSE: 149 mg/dL — AB (ref 70–99)
Potassium: 4.5 mmol/L (ref 3.5–5.1)
SODIUM: 140 mmol/L (ref 135–145)
TOTAL PROTEIN: 7.1 g/dL (ref 6.5–8.1)
Total Bilirubin: 0.5 mg/dL (ref 0.3–1.2)

## 2018-02-25 LAB — SAVE SMEAR(SSMR), FOR PROVIDER SLIDE REVIEW

## 2018-02-25 NOTE — Progress Notes (Signed)
Hematology and Oncology Follow Up Visit  Trevor Cooke [id number] [birth year] 77 y.o. 02/25/2018   Principle Diagnosis:   Myelodysplastic syndrome-low-grade (IPSS-R score = 1) - tp53+  Current Therapy:    Observation     Interim History:  Trevor Cooke is back for follow-up.  As always, Trevor Cooke comes in with his wife.  They had a nice Thanksgiving.  They actually will be heading out to [place] as a granddaughter is graduating from the [employer].  She will then get a job for [employer] in [place].  Trevor Cooke has been doing well.  Trevor Cooke has had no problems with fever.  Trevor Cooke has had no problems with cough.  Trevor Cooke has had no rashes.  Trevor Cooke has had no bleeding.  There is been no change in bowel or bladder habits.  His appetite is doing quite well.  Trevor Cooke ate quite a lot for Thanksgiving.  Trevor Cooke has had no problems with cough or shortness of breath.  Trevor Cooke has had no nausea or vomiting.    Overall, his performance status is ECOG 1.   Medications:  Current Outpatient Medications:  .  aspirin 81 MG tablet, Take 81 mg by mouth daily., Disp: , Rfl:  .  Bioflavonoid Products (ESTER-C) 500-200-60 MG TABS, Take 500 mg by mouth daily., Disp: , Rfl:  .  calcitRIOL (ROCALTROL) 0.5 MCG capsule, Take 0.5 mcg by mouth daily., Disp: , Rfl:  .  Calcium Carbonate (CALCIUM 600 PO), Take 600 mg by mouth daily., Disp: , Rfl:  .  Cholecalciferol (VITAMIN D) 2000 units CAPS, Take by mouth., Disp: , Rfl:  .  clobetasol cream (TEMOVATE) 2.33 %, Apply 1 application topically as needed. , Disp: , Rfl:  .  enalapril (VASOTEC) 10 MG tablet, Take 1 tablet (10 mg total) by mouth daily., Disp: 90 tablet, Rfl: 1 .  fluocinonide (LIDEX) 0.05 % external solution, Apply topically daily. Apply to scalp to treat affected areas, Disp: 60 mL, Rfl: 5 .  glucose blood (FREESTYLE PRECISION NEO TEST) test strip, Use as instructed to check blood sugar once a day. DX  E11.9, Disp: 100 each, Rfl: 3 .  labetalol (NORMODYNE) 100 MG tablet, Take 1  tablet (100 mg total) by mouth 2 (two) times daily., Disp: 180 tablet, Rfl: 3 .  levothyroxine (SYNTHROID, LEVOTHROID) 50 MCG tablet, Take 1 tablet (50 mcg total) by mouth daily., Disp: 90 tablet, Rfl: 3 .  Multiple Vitamins-Minerals (MULTIVITAMIN WITH MINERALS) tablet, Take by mouth daily., Disp: , Rfl:  .  mupirocin ointment (BACTROBAN) 2 %, Apply 1 application topically daily., Disp: , Rfl:  .  NIFEdipine (PROCARDIA XL/ADALAT-CC) 60 MG 24 hr tablet, Take 1 tablet (60 mg total) by mouth daily., Disp: 90 tablet, Rfl: 3 .  Selenium 200 MCG TABS, Take 200 mcg by mouth daily., Disp: , Rfl:  .  Zinc 50 MG CAPS, Take by mouth., Disp: , Rfl:   Allergies: No Known Allergies  Past Medical History, Surgical history, Social history, and Family History were reviewed and updated.  Review of Systems: Review of Systems  Constitutional: Negative.   HENT: Negative.   Eyes: Negative.   Respiratory: Negative.   Cardiovascular: Negative.   Gastrointestinal: Negative.   Genitourinary: Negative.   Musculoskeletal: Negative.   Skin: Negative.   Neurological: Negative.   Endo/Heme/Allergies: Negative.   Psychiatric/Behavioral: Negative.      Physical Exam:  weight is 190 lb (86.2 kg). His blood pressure is 133/75 and his pulse is 64. His respiration is 18 and  oxygen saturation is 98%.   Wt Readings from Last 3 Encounters:  02/25/18 190 lb (86.2 kg)  11/30/17 191 lb 4 oz (86.8 kg)  11/12/17 188 lb 12 oz (85.6 kg)     Physical Exam  Constitutional: Trevor Cooke is oriented to person, place, and time.  HENT:  Head: Normocephalic and atraumatic.  Mouth/Throat: Oropharynx is clear and moist.  Eyes: Pupils are equal, round, and reactive to light. EOM are normal.  Neck: Normal range of motion.  Cardiovascular: Normal rate, regular rhythm and normal heart sounds.  Pulmonary/Chest: Effort normal and breath sounds normal.  Abdominal: Soft. Bowel sounds are normal.  Musculoskeletal: Normal range of motion. Trevor Cooke  exhibits no edema, tenderness or deformity.  Lymphadenopathy:    Trevor Cooke has no cervical adenopathy.  Neurological: Trevor Cooke is alert and oriented to person, place, and time.  Skin: Skin is warm and dry. No rash noted. No erythema.  Psychiatric: Trevor Cooke has a normal mood and affect. His behavior is normal. Judgment and thought content normal.  Vitals reviewed.    Lab Results  Component Value Date   WBC 2.5 (L) 02/25/2018   HGB 11.9 (L) 02/25/2018   HCT 36.3 (L) 02/25/2018   MCV 97.6 02/25/2018   PLT 175 02/25/2018     Chemistry      Component Value Date/Time   NA 139 11/12/2017 0823   NA 144 04/26/2016 1117   K 3.9 11/12/2017 0823   K 3.9 04/26/2016 1117   CL 102 11/12/2017 0823   CL 98 04/26/2016 1117   CO2 30 11/12/2017 0823   CO2 18 04/26/2016 1117   BUN 16 11/12/2017 0823   BUN 13 04/26/2016 1117   CREATININE 1.04 11/12/2017 0823   CREATININE 1.2 04/26/2016 1117      Component Value Date/Time   CALCIUM 9.1 11/12/2017 0823   CALCIUM 9.1 04/26/2016 1117   ALKPHOS 92 11/12/2017 0823   ALKPHOS 99 (H) 04/26/2016 1117   AST 23 11/12/2017 0823   ALT 20 11/12/2017 0823   ALT 22 04/26/2016 1117   BILITOT 0.5 11/12/2017 0823         Impression and Plan: Trevor Cooke is a 77 year old white male. Trevor Cooke has myelodysplasia. This is manifested as leukopenia.   I looked at his blood under the microscope.  I did not see any immature myeloid cells.  Trevor Cooke had no blasts.  At this point, I think we can move his appointments out to every 4 months.  I do not see that Trevor Cooke needs any type of intervention.  I know that Trevor Cooke and his wife will be quite busy in December.   Volanda Napoleon, MD 12/2/20198:49 AM

## 2018-03-11 ENCOUNTER — Ambulatory Visit (INDEPENDENT_AMBULATORY_CARE_PROVIDER_SITE_OTHER): Payer: Medicare Other | Admitting: Family Medicine

## 2018-03-11 ENCOUNTER — Encounter: Payer: Self-pay | Admitting: Family Medicine

## 2018-03-11 VITALS — BP 136/78 | HR 53 | Temp 97.5°F | Resp 16 | Ht 68.0 in | Wt 185.6 lb

## 2018-03-11 DIAGNOSIS — E785 Hyperlipidemia, unspecified: Secondary | ICD-10-CM | POA: Diagnosis not present

## 2018-03-11 DIAGNOSIS — E119 Type 2 diabetes mellitus without complications: Secondary | ICD-10-CM | POA: Diagnosis not present

## 2018-03-11 DIAGNOSIS — R7303 Prediabetes: Secondary | ICD-10-CM | POA: Diagnosis not present

## 2018-03-11 DIAGNOSIS — N393 Stress incontinence (female) (male): Secondary | ICD-10-CM

## 2018-03-11 DIAGNOSIS — E039 Hypothyroidism, unspecified: Secondary | ICD-10-CM

## 2018-03-11 DIAGNOSIS — I10 Essential (primary) hypertension: Secondary | ICD-10-CM | POA: Diagnosis not present

## 2018-03-11 LAB — LIPID PANEL
Cholesterol: 182 mg/dL (ref 0–200)
HDL: 42.8 mg/dL (ref >39.00)
LDL Cholesterol: 119 mg/dL — ABNORMAL HIGH (ref 0–99)
NonHDL: 139.56
TRIGLYCERIDES: 104 mg/dL (ref 0.0–149.0)
Total CHOL/HDL Ratio: 4
VLDL: 20.8 mg/dL (ref 0.0–40.0)

## 2018-03-11 LAB — COMPREHENSIVE METABOLIC PANEL
ALT: 21 U/L (ref 0–53)
AST: 24 U/L (ref 0–37)
Albumin: 4.3 g/dL (ref 3.5–5.2)
Alkaline Phosphatase: 77 U/L (ref 39–117)
BUN: 14 mg/dL (ref 6–23)
CALCIUM: 9.3 mg/dL (ref 8.4–10.5)
CO2: 31 mEq/L (ref 19–32)
Chloride: 101 mEq/L (ref 96–112)
Creatinine, Ser: 1.05 mg/dL (ref 0.40–1.50)
GFR: 72.42 mL/min (ref >60.00)
Glucose, Bld: 106 mg/dL — ABNORMAL HIGH (ref 70–99)
Potassium: 4.8 mEq/L (ref 3.5–5.1)
Sodium: 139 mEq/L (ref 135–145)
Total Bilirubin: 0.5 mg/dL (ref 0.2–1.2)
Total Protein: 7 g/dL (ref 6.0–8.3)

## 2018-03-11 LAB — HEMOGLOBIN A1C: Hgb A1c MFr Bld: 6.2 % (ref 4.6–6.5)

## 2018-03-11 MED ORDER — LEVOTHYROXINE SODIUM 50 MCG PO TABS: 50.0000 ug | ORAL_TABLET | Freq: Every day | ORAL | 3 refills | Status: DC

## 2018-03-11 MED ORDER — ENALAPRIL MALEATE 10 MG PO TABS: 10.0000 mg | ORAL_TABLET | Freq: Every day | ORAL | 1 refills | Status: DC

## 2018-03-11 MED ORDER — GLUCOSE BLOOD VI STRP: ORAL_STRIP | 3 refills | Status: DC

## 2018-03-11 MED ORDER — LABETALOL HCL 100 MG PO TABS: 100.0000 mg | ORAL_TABLET | Freq: Two times a day (BID) | ORAL | 3 refills | Status: DC

## 2018-03-11 MED ORDER — ENALAPRIL MALEATE 20 MG PO TABS: 20.0000 mg | ORAL_TABLET | Freq: Every day | ORAL | 1 refills | Status: DC

## 2018-03-11 MED ORDER — NIFEDIPINE ER OSMOTIC RELEASE 60 MG PO TB24: 60.0000 mg | ORAL_TABLET | Freq: Every day | ORAL | 3 refills | Status: DC

## 2018-03-11 NOTE — Assessment & Plan Note (Signed)
Diet controlled. Check labs.  

## 2018-03-11 NOTE — Assessment & Plan Note (Signed)
Encouraged heart healthy diet, increase exercise, avoid trans fats, consider a krill oil cap daily 

## 2018-03-11 NOTE — Assessment & Plan Note (Signed)
Well controlled, no changes to meds. Encouraged heart healthy diet such as the DASH diet and exercise as tolerated.  °

## 2018-03-11 NOTE — Assessment & Plan Note (Signed)
Check labs 

## 2018-03-11 NOTE — Progress Notes (Signed)
Patient ID: Trevor Cooke, male    DOB: 05/20/1940  Age: 77 y.o. MRN: [id number]    Subjective:htn  Subjective  HPI Trevor Cooke presents for htn, thyroid and dm.    HYPERTENSION   Blood pressure range-running high  Chest pain- no      Dyspnea- no Lightheadedness- no   Edema- non  Other side effects - no   Medication compliance: good Low salt diet- yes    DIABETES    Blood Sugar ranges-85-149  Polyuria- no New Visual problems- no  Hypoglycemic symptoms- no  Other side effects-no Medication compliance - good Last eye exam- aug 2019 Foot exam- today   HYPERLIPIDEMIA  Medication compliance- good RUQ pain- no  Muscle aches- no Other side effects-no     Review of Systems  Constitutional: Negative for appetite change, diaphoresis, fatigue and unexpected weight change.  Eyes: Negative for pain, redness and visual disturbance.  Respiratory: Negative for cough, chest tightness, shortness of breath and wheezing.   Cardiovascular: Negative for chest pain, palpitations and leg swelling.  Endocrine: Negative for cold intolerance, heat intolerance, polydipsia, polyphagia and polyuria.  Genitourinary: Negative for difficulty urinating, dysuria and frequency.  Neurological: Negative for dizziness, light-headedness, numbness and headaches.    History Past Medical History:  Diagnosis Date  . Arthritis    in neck  . CVA (cerebral infarction) 1996  . GERD (gastroesophageal reflux disease)   . History of prediabetes   . Hyperlipidemia   . Hypertension   . MDS (myelodysplastic syndrome), low grade ([place]) 04/26/2016  . Neutropenia ([place])   . Psoriasis   . Thyroid disease     He has a past surgical history that includes Cleft palate repair (1940's).   His family history includes Cancer in his maternal uncle; Diabetes in his maternal uncle and sister; Hypertension in his father and mother; Stroke in his father.He reports that he has quit smoking. He has never used  smokeless tobacco. He reports current alcohol use. He reports that he does not use drugs.  Current Outpatient Medications on File Prior to Visit  Medication Sig Dispense Refill  . aspirin 81 MG tablet Take 81 mg by mouth daily.    Marland Kitchen Bioflavonoid Products (ESTER-C) 500-200-60 MG TABS Take 500 mg by mouth daily.    . Calcium Carbonate (CALCIUM 600 PO) Take 600 mg by mouth daily.    . Cholecalciferol (VITAMIN D) 2000 units CAPS Take by mouth.    . clobetasol cream (TEMOVATE) 6.57 % Apply 1 application topically as needed.     . famotidine (PEPCID) 40 MG tablet Take 40 mg by mouth 2 (two) times daily.    . fluocinonide (LIDEX) 0.05 % external solution Apply topically daily. Apply to scalp to treat affected areas 60 mL 5  . mirabegron ER (MYRBETRIQ) 25 MG TB24 tablet Take 1 tablet (25 mg total) by mouth daily. 30 tablet 0  . Multiple Vitamins-Minerals (MULTIVITAMIN WITH MINERALS) tablet Take by mouth daily.    . Selenium 200 MCG TABS Take 200 mcg by mouth daily.    . Zinc 50 MG CAPS Take by mouth.     No current facility-administered medications on file prior to visit.      Objective:  Objective  Physical Exam Vitals signs and nursing note reviewed.  Constitutional:      General: He is sleeping.     Appearance: He is well-developed.  HENT:     Head: Normocephalic and atraumatic.  Eyes:     Pupils: Pupils  are equal, round, and reactive to light.  Neck:     Musculoskeletal: Normal range of motion and neck supple.     Thyroid: No thyromegaly.  Cardiovascular:     Rate and Rhythm: Normal rate and regular rhythm.     Heart sounds: No murmur.  Pulmonary:     Effort: Pulmonary effort is normal. No respiratory distress.     Breath sounds: Normal breath sounds. No wheezing or rales.  Chest:     Chest wall: No tenderness.  Musculoskeletal:        General: No tenderness.  Skin:    General: Skin is warm and dry.  Neurological:     Mental Status: He is oriented to person, place, and time.   Psychiatric:        Behavior: Behavior normal.        Thought Content: Thought content normal.        Judgment: Judgment normal.    BP 136/78 (BP Location: Right Arm, Cuff Size: Normal)   Pulse (!) 53   Temp (!) 97.5 F (36.4 C) (Oral)   Resp 16   Ht 5\' 8"  (1.727 m)   Wt 185 lb 9.6 oz (84.2 kg)   SpO2 96%   BMI 28.22 kg/m  Wt Readings from Last 3 Encounters:  03/11/18 185 lb 9.6 oz (84.2 kg)  02/25/18 190 lb (86.2 kg)  11/30/17 191 lb 4 oz (86.8 kg)     Lab Results  Component Value Date   WBC 2.5 (L) 02/25/2018   HGB 11.9 (L) 02/25/2018   HCT 36.3 (L) 02/25/2018   PLT 175 02/25/2018   GLUCOSE 149 (H) 02/25/2018   CHOL 193 09/07/2017   TRIG 135.0 09/07/2017   HDL 39.90 09/07/2017   LDLCALC 127 (H) 09/07/2017   ALT 20 02/25/2018   AST 24 02/25/2018   NA 140 02/25/2018   K 4.5 02/25/2018   CL 101 02/25/2018   CREATININE 1.15 02/25/2018   BUN 16 02/25/2018   CO2 31 02/25/2018   TSH 3.82 09/07/2017   PSA 0.05 (L) 09/07/2017   HGBA1C 6.2 09/07/2017   MICROALBUR <0.7 08/27/2014    No results found.   Assessment & Plan:  Plan  I have discontinued Truddie Crumble H. Jolicoeur's calcitRIOL, enalapril, mupirocin ointment, and enalapril. I have also changed his NIFEdipine. Additionally, I am having him start on enalapril. Lastly, I am having him maintain his aspirin, fluocinonide, multivitamin with minerals, Zinc, Vitamin D, clobetasol cream, selenium, ESTER-C, Calcium Carbonate (CALCIUM 600 PO), mirabegron ER, famotidine, glucose blood, labetalol, and levothyroxine.  Meds ordered this encounter  Medications  . DISCONTD: enalapril (VASOTEC) 10 MG tablet    Sig: Take 1 tablet (10 mg total) by mouth daily.    Dispense:  90 tablet    Refill:  1  . glucose blood (FREESTYLE PRECISION NEO TEST) test strip    Sig: Use as instructed to check blood sugar once a day. DX  E11.9    Dispense:  100 each    Refill:  3  . labetalol (NORMODYNE) 100 MG tablet    Sig: Take 1 tablet (100 mg  total) by mouth 2 (two) times daily.    Dispense:  180 tablet    Refill:  3  . levothyroxine (SYNTHROID, LEVOTHROID) 50 MCG tablet    Sig: Take 1 tablet (50 mcg total) by mouth daily.    Dispense:  90 tablet    Refill:  3  . NIFEdipine (PROCARDIA XL/NIFEDICAL XL) 60 MG 24 hr  tablet    Sig: Take 1 tablet (60 mg total) by mouth daily.    Dispense:  90 tablet    Refill:  3  . enalapril (VASOTEC) 20 MG tablet    Sig: Take 1 tablet (20 mg total) by mouth daily.    Dispense:  90 tablet    Refill:  1    Problem List Items Addressed This Visit      Unprioritized   Controlled type 2 diabetes mellitus without complication ([place])    Diet controlled Check labs       Relevant Medications   enalapril (VASOTEC) 20 MG tablet   Essential hypertension    Well controlled, no changes to meds. Encouraged heart healthy diet such as the DASH diet and exercise as tolerated.       Relevant Medications   labetalol (NORMODYNE) 100 MG tablet   NIFEdipine (PROCARDIA XL/NIFEDICAL XL) 60 MG 24 hr tablet   enalapril (VASOTEC) 20 MG tablet   Other Relevant Orders   Comprehensive metabolic panel   Lipid panel   Hyperlipidemia    Encouraged heart healthy diet, increase exercise, avoid trans fats, consider a krill oil cap daily      Relevant Medications   labetalol (NORMODYNE) 100 MG tablet   NIFEdipine (PROCARDIA XL/NIFEDICAL XL) 60 MG 24 hr tablet   enalapril (VASOTEC) 20 MG tablet   Hypothyroidism    Check labs      Relevant Medications   labetalol (NORMODYNE) 100 MG tablet   levothyroxine (SYNTHROID, LEVOTHROID) 50 MCG tablet    Other Visit Diagnoses    Stress incontinence of urine    -  Primary   Relevant Medications   mirabegron ER (MYRBETRIQ) 25 MG TB24 tablet   Borderline diabetes       Relevant Medications   glucose blood (FREESTYLE PRECISION NEO TEST) test strip   Other Relevant Orders   Hemoglobin A1c   Comprehensive metabolic panel      Follow-up: Return in about 6 months  (around 09/10/2018), or if symptoms worsen or fail to improve, for hypertension, hyperlipidemia, diabetes II.  [name], DO

## 2018-03-11 NOTE — Patient Instructions (Signed)

## 2018-03-21 ENCOUNTER — Other Ambulatory Visit: Payer: Self-pay | Admitting: *Deleted

## 2018-03-21 DIAGNOSIS — I10 Essential (primary) hypertension: Secondary | ICD-10-CM

## 2018-03-21 DIAGNOSIS — E785 Hyperlipidemia, unspecified: Secondary | ICD-10-CM

## 2018-03-25 ENCOUNTER — Encounter: Payer: Self-pay | Admitting: Family Medicine

## 2018-03-25 ENCOUNTER — Encounter: Payer: Self-pay | Admitting: Hematology & Oncology

## 2018-03-26 NOTE — Telephone Encounter (Signed)
Duplicate message. 

## 2018-03-28 ENCOUNTER — Other Ambulatory Visit: Payer: Self-pay | Admitting: Family Medicine

## 2018-03-28 DIAGNOSIS — E785 Hyperlipidemia, unspecified: Secondary | ICD-10-CM

## 2018-03-28 MED ORDER — LOVASTATIN 20 MG PO TABS: 20.0000 mg | ORAL_TABLET | Freq: Every day | ORAL | 3 refills | Status: DC

## 2018-03-28 NOTE — Telephone Encounter (Signed)
Are you ok with him going back on the lovastatin 20?

## 2018-03-28 NOTE — Telephone Encounter (Signed)
rx printed

## 2018-07-08 ENCOUNTER — Other Ambulatory Visit: Payer: Self-pay

## 2018-07-08 ENCOUNTER — Encounter: Payer: Self-pay | Admitting: Family

## 2018-07-08 ENCOUNTER — Other Ambulatory Visit (INDEPENDENT_AMBULATORY_CARE_PROVIDER_SITE_OTHER): Payer: Medicare Other

## 2018-07-08 ENCOUNTER — Telehealth: Payer: Self-pay

## 2018-07-08 ENCOUNTER — Inpatient Hospital Stay: Payer: Medicare Other | Attending: Hematology & Oncology | Admitting: Family

## 2018-07-08 ENCOUNTER — Inpatient Hospital Stay: Payer: Medicare Other

## 2018-07-08 VITALS — BP 132/83 | HR 55 | Temp 97.6°F | Resp 19 | Wt 189.0 lb

## 2018-07-08 DIAGNOSIS — Z79899 Other long term (current) drug therapy: Secondary | ICD-10-CM | POA: Diagnosis not present

## 2018-07-08 DIAGNOSIS — I10 Essential (primary) hypertension: Secondary | ICD-10-CM | POA: Diagnosis not present

## 2018-07-08 DIAGNOSIS — E785 Hyperlipidemia, unspecified: Secondary | ICD-10-CM | POA: Diagnosis not present

## 2018-07-08 DIAGNOSIS — D4620 Refractory anemia with excess of blasts, unspecified: Secondary | ICD-10-CM

## 2018-07-08 LAB — COMPREHENSIVE METABOLIC PANEL
ALT: 18 U/L (ref 0–53)
AST: 26 U/L (ref 0–37)
Albumin: 4.4 g/dL (ref 3.5–5.2)
Alkaline Phosphatase: 81 U/L (ref 39–117)
BUN: 15 mg/dL (ref 6–23)
CO2: 30 mEq/L (ref 19–32)
Calcium: 9.6 mg/dL (ref 8.4–10.5)
Chloride: 99 mEq/L (ref 96–112)
Creatinine, Ser: 1.06 mg/dL (ref 0.40–1.50)
GFR: 67.34 mL/min (ref >60.00)
Glucose, Bld: 108 mg/dL — ABNORMAL HIGH (ref 70–99)
Potassium: 4.7 mEq/L (ref 3.5–5.1)
Sodium: 137 mEq/L (ref 135–145)
Total Bilirubin: 0.4 mg/dL (ref 0.2–1.2)
Total Protein: 7.2 g/dL (ref 6.0–8.3)

## 2018-07-08 LAB — CBC WITH DIFFERENTIAL (CANCER CENTER ONLY)
Abs Immature Granulocytes: 0.01 10*3/uL (ref 0.00–0.07)
Basophils Absolute: 0 10*3/uL (ref 0.0–0.1)
Basophils Relative: 0 %
Eosinophils Absolute: 0.1 10*3/uL (ref 0.0–0.5)
Eosinophils Relative: 4 %
HCT: 34.2 % — ABNORMAL LOW (ref 39.0–52.0)
Hemoglobin: 11.7 g/dL — ABNORMAL LOW (ref 13.0–17.0)
Immature Granulocytes: 0 %
Lymphocytes Relative: 43 %
Lymphs Abs: 1 10*3/uL (ref 0.7–4.0)
MCH: 33.1 pg (ref 26.0–34.0)
MCHC: 34.2 g/dL (ref 30.0–36.0)
MCV: 96.9 fL (ref 80.0–100.0)
Monocytes Absolute: 0.1 10*3/uL (ref 0.1–1.0)
Monocytes Relative: 5 %
Neutro Abs: 1.1 10*3/uL — ABNORMAL LOW (ref 1.7–7.7)
Neutrophils Relative %: 48 %
Platelet Count: 147 10*3/uL — ABNORMAL LOW (ref 150–400)
RBC: 3.53 MIL/uL — ABNORMAL LOW (ref 4.22–5.81)
RDW: 13 % (ref 11.5–15.5)
WBC Count: 2.3 10*3/uL — ABNORMAL LOW (ref 4.0–10.5)
nRBC: 0 % (ref 0.0–0.2)

## 2018-07-08 LAB — CMP (CANCER CENTER ONLY)
ALT: 18 U/L (ref 0–44)
AST: 26 U/L (ref 15–41)
Albumin: 4.6 g/dL (ref 3.5–5.0)
Alkaline Phosphatase: 82 U/L (ref 38–126)
Anion gap: 8 (ref 5–15)
BUN: 15 mg/dL (ref 8–23)
CO2: 32 mmol/L (ref 22–32)
Calcium: 9.5 mg/dL (ref 8.9–10.3)
Chloride: 99 mmol/L (ref 98–111)
Creatinine: 1.12 mg/dL (ref 0.61–1.24)
GFR, Est AFR Am: >60 mL/min (ref >60)
GFR, Estimated: >60 mL/min (ref >60)
Glucose, Bld: 120 mg/dL — ABNORMAL HIGH (ref 70–99)
Potassium: 4.1 mmol/L (ref 3.5–5.1)
Sodium: 139 mmol/L (ref 135–145)
Total Bilirubin: 0.4 mg/dL (ref 0.3–1.2)
Total Protein: 7.5 g/dL (ref 6.5–8.1)

## 2018-07-08 LAB — IRON AND TIBC
Iron: 64 ug/dL (ref 42–163)
Saturation Ratios: 22 % (ref 20–55)
TIBC: 286 ug/dL (ref 202–409)
UIBC: 223 ug/dL (ref 117–376)

## 2018-07-08 LAB — SAVE SMEAR(SSMR), FOR PROVIDER SLIDE REVIEW

## 2018-07-08 LAB — RETICULOCYTES
Immature Retic Fract: 9.4 % (ref 2.3–15.9)
RBC.: 3.57 MIL/uL — ABNORMAL LOW (ref 4.22–5.81)
Retic Count, Absolute: 52.5 10*3/uL (ref 19.0–186.0)
Retic Ct Pct: 1.5 % (ref 0.4–3.1)

## 2018-07-08 LAB — LIPID PANEL
Cholesterol: 129 mg/dL (ref 0–200)
HDL: 43.6 mg/dL (ref >39.00)
LDL Cholesterol: 67 mg/dL (ref 0–99)
NonHDL: 85.71
Total CHOL/HDL Ratio: 3
Triglycerides: 93 mg/dL (ref 0.0–149.0)
VLDL: 18.6 mg/dL (ref 0.0–40.0)

## 2018-07-08 LAB — FERRITIN: Ferritin: 166 ng/mL (ref 24–336)

## 2018-07-08 NOTE — Telephone Encounter (Signed)
Labs drawn earlier this morning

## 2018-07-08 NOTE — Progress Notes (Signed)
Hematology and Oncology Follow Up Visit  Trevor Cooke [id number] [birth year] 78 y.o. 07/08/2018   Principle Diagnosis:  Myelodysplastic syndrome-low-grade (IPSS-R score = 1) - tp53+  Current Therapy:   Observation   Interim History:  Trevor Cooke is here today for follow-up. He is doing well and staying quite active working in his yard.  He denies fatigue and is resting well at night.  His counts remain stable. WBC 2.3, ANC 1.1, Hgb 11.7 and platelet count 147.  No fever, chills, n/v, cough, dizziness, SOB, chest pain, palpitations, abdominal pain or changes in bowel or bladder habits.  No swelling, tenderness, numbness or tingling in his extremities.  No lymphadenopathy noted on exam.  No episodes of bleeding, no bruising or petechiae.  He has maintained a good appetite and states that his wife has him on a low/no meat diet. They are supplementing with beans. He is hydrating well and his weight is stable.   ECOG Performance Status: 1 - Symptomatic but completely ambulatory  Medications:  Allergies as of 07/08/2018   No Known Allergies     Medication List       Accurate as of July 08, 2018  9:16 AM. Always use your most recent med list.        aspirin 81 MG tablet Take 81 mg by mouth daily.   CALCIUM 600 PO Take 600 mg by mouth daily.   clobetasol cream 0.05 % Commonly known as:  TEMOVATE Apply 1 application topically as needed.   enalapril 20 MG tablet Commonly known as:  VASOTEC Take 1 tablet (20 mg total) by mouth daily.   Ester-C 500-200-60 MG Tabs Take 500 mg by mouth daily.   famotidine 40 MG tablet Commonly known as:  PEPCID Take 40 mg by mouth 2 (two) times daily.   fluocinonide 0.05 % external solution Commonly known as:  LIDEX Apply topically daily. Apply to scalp to treat affected areas   glucose blood test strip Commonly known as:  FreeStyle Precision Neo Test Use as instructed to check blood sugar once a day. DX  E11.9   labetalol  100 MG tablet Commonly known as:  NORMODYNE Take 1 tablet (100 mg total) by mouth 2 (two) times daily.   levothyroxine 50 MCG tablet Commonly known as:  SYNTHROID, LEVOTHROID Take 1 tablet (50 mcg total) by mouth daily.   lovastatin 20 MG tablet Commonly known as:  MEVACOR Take 1 tablet (20 mg total) by mouth at bedtime.   multivitamin with minerals tablet Take by mouth daily.   Myrbetriq 25 MG Tb24 tablet Generic drug:  mirabegron ER Take 1 tablet (25 mg total) by mouth daily.   NIFEdipine 60 MG 24 hr tablet Commonly known as:  PROCARDIA XL/NIFEDICAL XL Take 1 tablet (60 mg total) by mouth daily.   selenium 200 MCG Tabs tablet Take 200 mcg by mouth daily.   Vitamin D 50 MCG (2000 UT) Caps Take by mouth.   Zinc 50 MG Caps Take by mouth.       Allergies: No Known Allergies  Past Medical History, Surgical history, Social history, and Family History were reviewed and updated.  Review of Systems: All other 10 point review of systems is negative.   Physical Exam:  vitals were not taken for this visit.   Wt Readings from Last 3 Encounters:  03/11/18 185 lb 9.6 oz (84.2 kg)  02/25/18 190 lb (86.2 kg)  11/30/17 191 lb 4 oz (86.8 kg)    Ocular: Sclerae unicteric, pupils  equal, round and reactive to light Ear-nose-throat: Oropharynx clear, dentition fair Lymphatic: No cervical or supraclavicular adenopathy Lungs no rales or rhonchi, good excursion bilaterally Heart regular rate and rhythm, no murmur appreciated Abd soft, nontender, positive bowel sounds, no liver or spleen tip palpated on exam, no fluid wave  MSK no focal spinal tenderness, no joint edema Neuro: non-focal, well-oriented, appropriate affect Breasts: Deferred   Lab Results  Component Value Date   WBC 2.3 (L) 07/08/2018   HGB 11.7 (L) 07/08/2018   HCT 34.2 (L) 07/08/2018   MCV 96.9 07/08/2018   PLT 147 (L) 07/08/2018   Lab Results  Component Value Date   FERRITIN 175 11/12/2017   IRON 51  11/12/2017   TIBC 275 11/12/2017   UIBC 224 11/12/2017   IRONPCTSAT 19 (L) 11/12/2017   Lab Results  Component Value Date   RETICCTPCT 1.5 07/08/2018   RBC 3.57 (L) 07/08/2018   No results found for: KPAFRELGTCHN, LAMBDASER, KAPLAMBRATIO No results found for: IGGSERUM, IGA, IGMSERUM No results found for: Odetta Pink, SPEI   Chemistry      Component Value Date/Time   NA 139 03/11/2018 1012   NA 144 04/26/2016 1117   K 4.8 03/11/2018 1012   K 3.9 04/26/2016 1117   CL 101 03/11/2018 1012   CL 98 04/26/2016 1117   CO2 31 03/11/2018 1012   CO2 18 04/26/2016 1117   BUN 14 03/11/2018 1012   BUN 13 04/26/2016 1117   CREATININE 1.05 03/11/2018 1012   CREATININE 1.15 02/25/2018 0827   CREATININE 1.2 04/26/2016 1117      Component Value Date/Time   CALCIUM 9.3 03/11/2018 1012   CALCIUM 9.1 04/26/2016 1117   ALKPHOS 77 03/11/2018 1012   ALKPHOS 99 (H) 04/26/2016 1117   AST 24 03/11/2018 1012   AST 24 02/25/2018 0827   ALT 21 03/11/2018 1012   ALT 20 02/25/2018 0827   ALT 22 04/26/2016 1117   BILITOT 0.5 03/11/2018 1012   BILITOT 0.5 02/25/2018 0827       Impression and Plan: Trevor Cooke is a very pleasant 78 yo caucasian gentleman with myelodysplasia. He continues to do well and his counts today remain stable.  We will continue to follow along with him and plan to see him in another 4 months for MD follow-up.  He will contact our office with any questions or concerns. We can certainly see her sooner if need be.   Laverna Peace, NP 4/13/20209:16 AM

## 2018-07-08 NOTE — Telephone Encounter (Signed)
Copied from [place] [id number]. Topic: Appointment Scheduling - Scheduling Inquiry for Clinic >> Jul 05, 2018 10:09 AM Burchel, Abbi R wrote: Reason for CRM:   Pt would like to have labs done on Monday 07/08/2018 while he is there for another appt. Please advise.

## 2018-07-09 ENCOUNTER — Encounter: Payer: Self-pay | Admitting: Family

## 2018-07-09 ENCOUNTER — Encounter: Payer: Self-pay | Admitting: *Deleted

## 2018-08-29 ENCOUNTER — Other Ambulatory Visit: Payer: Self-pay | Admitting: Family Medicine

## 2018-08-29 DIAGNOSIS — I10 Essential (primary) hypertension: Secondary | ICD-10-CM

## 2018-09-12 ENCOUNTER — Ambulatory Visit (INDEPENDENT_AMBULATORY_CARE_PROVIDER_SITE_OTHER): Payer: Medicare Other | Admitting: Family Medicine

## 2018-09-12 ENCOUNTER — Other Ambulatory Visit: Payer: Self-pay

## 2018-09-12 ENCOUNTER — Encounter: Payer: Self-pay | Admitting: Family Medicine

## 2018-09-12 VITALS — BP 138/83 | HR 54 | Temp 97.6°F | Ht 68.0 in | Wt 182.2 lb

## 2018-09-12 DIAGNOSIS — E785 Hyperlipidemia, unspecified: Secondary | ICD-10-CM | POA: Diagnosis not present

## 2018-09-12 DIAGNOSIS — I10 Essential (primary) hypertension: Secondary | ICD-10-CM

## 2018-09-12 DIAGNOSIS — E039 Hypothyroidism, unspecified: Secondary | ICD-10-CM | POA: Diagnosis not present

## 2018-09-12 MED ORDER — FUROSEMIDE 20 MG PO TABS: 20.0000 mg | ORAL_TABLET | Freq: Every day | ORAL | 1 refills | Status: DC

## 2018-09-12 MED ORDER — ENALAPRIL MALEATE 20 MG PO TABS: 20.0000 mg | ORAL_TABLET | Freq: Every day | ORAL | 1 refills | Status: DC

## 2018-09-12 NOTE — Progress Notes (Signed)
Virtual Visit via Video Note  I connected with Trevor Cooke on 09/12/18 at 10:00 AM EDT by a video enabled telemedicine application and verified that I am speaking with the correct person using two identifiers.  Location: Patient: home Provider: office    I discussed the limitations of evaluation and management by telemedicine and the availability of in person appointments. The patient expressed understanding and agreed to proceed.  History of Present Illness: Pt is home and needs refills No other complaints    Past Medical History:  Diagnosis Date  . Arthritis    in neck  . CVA (cerebral infarction) 1996  . GERD (gastroesophageal reflux disease)   . History of prediabetes   . Hyperlipidemia   . Hypertension   . MDS (myelodysplastic syndrome), low grade ([place]) 04/26/2016  . Neutropenia ([place])   . Psoriasis   . Thyroid disease    Current Outpatient Medications on File Prior to Visit  Medication Sig Dispense Refill  . aspirin 81 MG tablet Take 81 mg by mouth daily.    Marland Kitchen Bioflavonoid Products (ESTER-C) 500-200-60 MG TABS Take 500 mg by mouth daily.    . Calcium Carbonate (CALCIUM 600 PO) Take 600 mg by mouth daily.    . Cholecalciferol (VITAMIN D) 2000 units CAPS Take by mouth.    . clobetasol cream (TEMOVATE) 7.12 % Apply 1 application topically as needed.     . famotidine (PEPCID) 40 MG tablet Take 40 mg by mouth 2 (two) times daily.    . fluocinonide (LIDEX) 0.05 % external solution Apply topically daily. Apply to scalp to treat affected areas 60 mL 5  . glucose blood (FREESTYLE PRECISION NEO TEST) test strip Use as instructed to check blood sugar once a day. DX  E11.9 100 each 3  . labetalol (NORMODYNE) 100 MG tablet Take 1 tablet (100 mg total) by mouth 2 (two) times daily. 180 tablet 3  . levothyroxine (SYNTHROID, LEVOTHROID) 50 MCG tablet Take 1 tablet (50 mcg total) by mouth daily. 90 tablet 3  . lovastatin (MEVACOR) 20 MG tablet Take 1 tablet (20 mg total) by mouth at  bedtime. 90 tablet 3  . Multiple Vitamins-Minerals (MULTIVITAMIN WITH MINERALS) tablet Take by mouth daily.    Marland Kitchen NIFEdipine (PROCARDIA XL/NIFEDICAL XL) 60 MG 24 hr tablet Take 1 tablet (60 mg total) by mouth daily. 90 tablet 3  . Selenium 200 MCG TABS Take 200 mcg by mouth daily.    . Zinc 50 MG CAPS Take by mouth.     No current facility-administered medications on file prior to visit.     Observations/Objective: Vitals:   09/12/18 0951  BP: 138/83  Pulse: (!) 54  Temp: 97.6 F (36.4 C)     Assessment and Plan: 1. Essential hypertension Well controlled, no changes to meds. Encouraged heart healthy diet such as the DASH diet and exercise as tolerated.   - enalapril (VASOTEC) 20 MG tablet; Take 1 tablet (20 mg total) by mouth daily.  Dispense: 90 tablet; Refill: 1 - furosemide (LASIX) 20 MG tablet; Take 1 tablet (20 mg total) by mouth daily.  Dispense: 90 tablet; Refill: 1  2. Hyperlipidemia, unspecified hyperlipidemia type Tolerating statin, encouraged heart healthy diet, avoid trans fats, minimize simple carbs and saturated fats. Increase exercise as tolerated - Lipid panel; Future - Comprehensive metabolic panel; Future  3. Hypothyroidism, unspecified type Check labs  - TSH; Future   Follow Up Instructions:    I discussed the assessment and treatment plan with the  patient. The patient was provided an opportunity to ask questions and all were answered. The patient agreed with the plan and demonstrated an understanding of the instructions.   The patient was advised to call back or seek an in-person evaluation if the symptoms worsen or if the condition fails to improve as anticipated.  I provided 25 minutes of non-face-to-face time during this encounter.   [name], DO

## 2018-09-12 NOTE — Addendum Note (Signed)
Addended by: Kelle Darting A on: 09/12/2018 02:12 PM   Modules accepted: Orders

## 2018-09-13 LAB — LIPID PANEL
Cholesterol: 116 mg/dL (ref 0–200)
HDL: 37.2 mg/dL — ABNORMAL LOW (ref >39.00)
LDL Cholesterol: 53 mg/dL (ref 0–99)
NonHDL: 79.04
Total CHOL/HDL Ratio: 3
Triglycerides: 131 mg/dL (ref 0.0–149.0)
VLDL: 26.2 mg/dL (ref 0.0–40.0)

## 2018-09-13 LAB — COMPREHENSIVE METABOLIC PANEL
ALT: 15 U/L (ref 0–53)
AST: 20 U/L (ref 0–37)
Albumin: 4.2 g/dL (ref 3.5–5.2)
Alkaline Phosphatase: 86 U/L (ref 39–117)
BUN: 18 mg/dL (ref 6–23)
CO2: 30 mEq/L (ref 19–32)
Calcium: 9.1 mg/dL (ref 8.4–10.5)
Chloride: 99 mEq/L (ref 96–112)
Creatinine, Ser: 1.11 mg/dL (ref 0.40–1.50)
GFR: 63.82 mL/min (ref >60.00)
Glucose, Bld: 84 mg/dL (ref 70–99)
Potassium: 4.3 mEq/L (ref 3.5–5.1)
Sodium: 138 mEq/L (ref 135–145)
Total Bilirubin: 0.4 mg/dL (ref 0.2–1.2)
Total Protein: 6.9 g/dL (ref 6.0–8.3)

## 2018-09-13 LAB — TSH: TSH: 4.95 u[IU]/mL — ABNORMAL HIGH (ref 0.35–4.50)

## 2018-09-16 ENCOUNTER — Ambulatory Visit: Payer: Medicare Other | Admitting: Family Medicine

## 2018-09-21 ENCOUNTER — Other Ambulatory Visit: Payer: Self-pay | Admitting: Family Medicine

## 2018-09-21 DIAGNOSIS — E039 Hypothyroidism, unspecified: Secondary | ICD-10-CM

## 2018-09-24 ENCOUNTER — Other Ambulatory Visit: Payer: Self-pay

## 2018-09-24 MED ORDER — LEVOTHYROXINE SODIUM 75 MCG PO TABS: 75.0000 ug | ORAL_TABLET | Freq: Every day | ORAL | 1 refills | Status: DC

## 2018-09-24 NOTE — Progress Notes (Signed)
New Rx for Levothyroxine ordered per Dr. [name]. Pt requested Rx be mailed and advised patient that Dr. [name] will not be back in office til 09/26/2018 to sign the RX.

## 2018-11-07 ENCOUNTER — Telehealth: Payer: Self-pay | Admitting: Hematology & Oncology

## 2018-11-07 ENCOUNTER — Other Ambulatory Visit: Payer: Self-pay

## 2018-11-07 ENCOUNTER — Encounter: Payer: Self-pay | Admitting: Hematology & Oncology

## 2018-11-07 ENCOUNTER — Inpatient Hospital Stay: Payer: Medicare Other | Attending: Hematology & Oncology

## 2018-11-07 ENCOUNTER — Inpatient Hospital Stay (HOSPITAL_BASED_OUTPATIENT_CLINIC_OR_DEPARTMENT_OTHER): Payer: Medicare Other | Admitting: Hematology & Oncology

## 2018-11-07 VITALS — BP 157/86 | HR 45 | Temp 97.1°F | Resp 19 | Wt 185.0 lb

## 2018-11-07 DIAGNOSIS — D4620 Refractory anemia with excess of blasts, unspecified: Secondary | ICD-10-CM

## 2018-11-07 DIAGNOSIS — Z7982 Long term (current) use of aspirin: Secondary | ICD-10-CM | POA: Insufficient documentation

## 2018-11-07 DIAGNOSIS — L409 Psoriasis, unspecified: Secondary | ICD-10-CM | POA: Insufficient documentation

## 2018-11-07 DIAGNOSIS — R21 Rash and other nonspecific skin eruption: Secondary | ICD-10-CM | POA: Insufficient documentation

## 2018-11-07 DIAGNOSIS — D469 Myelodysplastic syndrome, unspecified: Secondary | ICD-10-CM | POA: Insufficient documentation

## 2018-11-07 DIAGNOSIS — Z79899 Other long term (current) drug therapy: Secondary | ICD-10-CM | POA: Insufficient documentation

## 2018-11-07 LAB — CMP (CANCER CENTER ONLY)
ALT: 14 U/L (ref 0–44)
AST: 24 U/L (ref 15–41)
Albumin: 4 g/dL (ref 3.5–5.0)
Alkaline Phosphatase: 90 U/L (ref 38–126)
Anion gap: 6 (ref 5–15)
BUN: 17 mg/dL (ref 8–23)
CO2: 31 mmol/L (ref 22–32)
Calcium: 8.8 mg/dL — ABNORMAL LOW (ref 8.9–10.3)
Chloride: 100 mmol/L (ref 98–111)
Creatinine: 1.02 mg/dL (ref 0.61–1.24)
GFR, Est AFR Am: >60 mL/min (ref >60)
GFR, Estimated: >60 mL/min (ref >60)
Glucose, Bld: 96 mg/dL (ref 70–99)
Potassium: 4.1 mmol/L (ref 3.5–5.1)
Sodium: 137 mmol/L (ref 135–145)
Total Bilirubin: 0.5 mg/dL (ref 0.3–1.2)
Total Protein: 6.7 g/dL (ref 6.5–8.1)

## 2018-11-07 LAB — CBC WITH DIFFERENTIAL (CANCER CENTER ONLY)
Abs Immature Granulocytes: 0.01 10*3/uL (ref 0.00–0.07)
Basophils Absolute: 0 10*3/uL (ref 0.0–0.1)
Basophils Relative: 0 %
Eosinophils Absolute: 0.2 10*3/uL (ref 0.0–0.5)
Eosinophils Relative: 10 %
HCT: 33.2 % — ABNORMAL LOW (ref 39.0–52.0)
Hemoglobin: 11 g/dL — ABNORMAL LOW (ref 13.0–17.0)
Immature Granulocytes: 1 %
Lymphocytes Relative: 42 %
Lymphs Abs: 0.9 10*3/uL (ref 0.7–4.0)
MCH: 32.4 pg (ref 26.0–34.0)
MCHC: 33.1 g/dL (ref 30.0–36.0)
MCV: 97.6 fL (ref 80.0–100.0)
Monocytes Absolute: 0.1 10*3/uL (ref 0.1–1.0)
Monocytes Relative: 5 %
Neutro Abs: 0.9 10*3/uL — ABNORMAL LOW (ref 1.7–7.7)
Neutrophils Relative %: 42 %
Platelet Count: 145 10*3/uL — ABNORMAL LOW (ref 150–400)
RBC: 3.4 MIL/uL — ABNORMAL LOW (ref 4.22–5.81)
RDW: 13.1 % (ref 11.5–15.5)
WBC Count: 2.1 10*3/uL — ABNORMAL LOW (ref 4.0–10.5)
nRBC: 0 % (ref 0.0–0.2)

## 2018-11-07 LAB — RETICULOCYTES
Immature Retic Fract: 9.9 % (ref 2.3–15.9)
RBC.: 3.38 MIL/uL — ABNORMAL LOW (ref 4.22–5.81)
Retic Count, Absolute: 49.3 10*3/uL (ref 19.0–186.0)
Retic Ct Pct: 1.5 % (ref 0.4–3.1)

## 2018-11-07 LAB — IRON AND TIBC
Iron: 56 ug/dL (ref 42–163)
Saturation Ratios: 22 % (ref 20–55)
TIBC: 256 ug/dL (ref 202–409)
UIBC: 200 ug/dL (ref 117–376)

## 2018-11-07 LAB — SAVE SMEAR(SSMR), FOR PROVIDER SLIDE REVIEW

## 2018-11-07 LAB — FERRITIN: Ferritin: 218 ng/mL (ref 24–336)

## 2018-11-07 NOTE — Telephone Encounter (Signed)
Appointments scheduled wife notified per 8/13 los

## 2018-11-07 NOTE — Progress Notes (Signed)
Hematology and Oncology Follow Up Visit  Trevor Cooke [id number] April 04, 1940 78 y.o. 11/07/2018   Principle Diagnosis:   Myelodysplastic syndrome-low-grade (IPSS-R score = 1) - tp53+  Current Therapy:    Observation     Interim History:  [name] is back for follow-up.  He is doing okay.  We last saw him back in April.  He has a rash on his face.  This could be from the new mask that he is wearing.  However, the rash certainly looks like rash from lupus.  He does have psoriasis.  As such, I think that we probably need to check him out for lupus.  Lupus can certainly affect his blood counts.  He has had no problems with fever.  He has had no swollen lymph nodes.  He has had no change in bowel or bladder habits.  He has had no cough.  He has had a good appetite.  His weight is down a little bit.  I do not think he has had any new medications that he has been taking.      Overall, his performance status is ECOG 1.   Medications:  Current Outpatient Medications:    aspirin 81 MG tablet, Take 81 mg by mouth daily., Disp: , Rfl:    Bioflavonoid Products (ESTER-C) 500-200-60 MG TABS, Take 500 mg by mouth daily., Disp: , Rfl:    Calcium Carbonate (CALCIUM 600 PO), Take 600 mg by mouth daily., Disp: , Rfl:    Cholecalciferol (VITAMIN D) 2000 units CAPS, Take by mouth., Disp: , Rfl:    clobetasol cream (TEMOVATE) 0.09 %, Apply 1 application topically as needed. , Disp: , Rfl:    enalapril (VASOTEC) 20 MG tablet, Take 1 tablet (20 mg total) by mouth daily., Disp: 90 tablet, Rfl: 1   famotidine (PEPCID) 40 MG tablet, Take 40 mg by mouth 2 (two) times daily., Disp: , Rfl:    fluocinonide (LIDEX) 0.05 % external solution, Apply topically daily. Apply to scalp to treat affected areas, Disp: 60 mL, Rfl: 5   furosemide (LASIX) 20 MG tablet, Take 1 tablet (20 mg total) by mouth daily., Disp: 90 tablet, Rfl: 1   glucose blood (FREESTYLE PRECISION NEO TEST) test strip, Use as  instructed to check blood sugar once a day. DX  E11.9, Disp: 100 each, Rfl: 3   labetalol (NORMODYNE) 100 MG tablet, Take 1 tablet (100 mg total) by mouth 2 (two) times daily., Disp: 180 tablet, Rfl: 3   levothyroxine (SYNTHROID) 75 MCG tablet, Take 1 tablet (75 mcg total) by mouth daily., Disp: 90 tablet, Rfl: 1   lovastatin (MEVACOR) 20 MG tablet, Take 1 tablet (20 mg total) by mouth at bedtime., Disp: 90 tablet, Rfl: 3   Multiple Vitamins-Minerals (MULTIVITAMIN WITH MINERALS) tablet, Take by mouth daily., Disp: , Rfl:    NIFEdipine (PROCARDIA XL/NIFEDICAL XL) 60 MG 24 hr tablet, Take 1 tablet (60 mg total) by mouth daily., Disp: 90 tablet, Rfl: 3   Selenium 200 MCG TABS, Take 200 mcg by mouth daily., Disp: , Rfl:    Zinc 50 MG CAPS, Take by mouth., Disp: , Rfl:   Allergies: No Known Allergies  Past Medical History, Surgical history, Social history, and Family History were reviewed and updated.  Review of Systems: Review of Systems  Constitutional: Negative.   HENT: Negative.   Eyes: Negative.   Respiratory: Negative.   Cardiovascular: Negative.   Gastrointestinal: Negative.   Genitourinary: Negative.   Musculoskeletal: Negative.   Skin:  Negative.   Neurological: Negative.   Endo/Heme/Allergies: Negative.   Psychiatric/Behavioral: Negative.      Physical Exam:  vitals were not taken for this visit.   Wt Readings from Last 3 Encounters:  09/12/18 182 lb 3.2 oz (82.6 kg)  07/08/18 189 lb (85.7 kg)  03/11/18 185 lb 9.6 oz (84.2 kg)     Physical Exam Vitals signs reviewed.  HENT:     Head: Normocephalic and atraumatic.  Eyes:     Pupils: Pupils are equal, round, and reactive to light.  Neck:     Musculoskeletal: Normal range of motion.  Cardiovascular:     Rate and Rhythm: Normal rate and regular rhythm.     Heart sounds: Normal heart sounds.  Pulmonary:     Effort: Pulmonary effort is normal.     Breath sounds: Normal breath sounds.  Abdominal:      General: Bowel sounds are normal.     Palpations: Abdomen is soft.  Musculoskeletal: Normal range of motion.        General: No tenderness or deformity.  Lymphadenopathy:     Cervical: No cervical adenopathy.  Skin:    General: Skin is warm and dry.     Findings: No erythema or rash.  Neurological:     Mental Status: He is alert and oriented to person, place, and time.  Psychiatric:        Behavior: Behavior normal.        Thought Content: Thought content normal.        Judgment: Judgment normal.      Lab Results  Component Value Date   WBC 2.1 (L) 11/07/2018   HGB 11.0 (L) 11/07/2018   HCT 33.2 (L) 11/07/2018   MCV 97.6 11/07/2018   PLT 145 (L) 11/07/2018     Chemistry      Component Value Date/Time   NA 137 11/07/2018 0828   NA 144 04/26/2016 1117   K 4.1 11/07/2018 0828   K 3.9 04/26/2016 1117   CL 100 11/07/2018 0828   CL 98 04/26/2016 1117   CO2 31 11/07/2018 0828   CO2 18 04/26/2016 1117   BUN 17 11/07/2018 0828   BUN 13 04/26/2016 1117   CREATININE 1.02 11/07/2018 0828   CREATININE 1.2 04/26/2016 1117      Component Value Date/Time   CALCIUM 8.8 (L) 11/07/2018 0828   CALCIUM 9.1 04/26/2016 1117   ALKPHOS 90 11/07/2018 0828   ALKPHOS 99 (H) 04/26/2016 1117   AST 24 11/07/2018 0828   ALT 14 11/07/2018 0828   ALT 22 04/26/2016 1117   BILITOT 0.5 11/07/2018 0828         Impression and Plan: [name] is a 78 year old white male. He has myelodysplasia. This is manifested as leukopenia.   I looked at his blood under the microscope.  I did not see any immature myeloid cells.  He had no blasts.  Again, I think we have to check him for lupus.  I will get him back to the lab today for this.  He will see a dermatologist for this rash.  I think this would be a good idea.  I would like to see him back in 3 months.   Volanda Napoleon, MD 8/13/20209:18 AM

## 2018-11-08 LAB — ANTI-DNA ANTIBODY, DOUBLE-STRANDED: ds DNA Ab: 1 IU/mL (ref 0–9)

## 2018-11-08 LAB — ANTINUCLEAR ANTIBODIES, IFA: ANA Ab, IFA: NEGATIVE

## 2018-11-11 ENCOUNTER — Telehealth: Payer: Self-pay | Admitting: *Deleted

## 2018-11-11 NOTE — Telephone Encounter (Signed)
-----   Message from Volanda Napoleon, MD sent at 11/08/2018  3:09 PM EDT ----- Call - you do NOT have lupus!!  Laurey Arrow

## 2018-11-11 NOTE — Telephone Encounter (Signed)
Notified pt of results. Pt states " I don't believe this. I don't think there is only one test to prove I do or do not have it. I have a rash on my face and I want to know what this is. Id like to go to a different hospital to talk to someone about what this is and talk to other doctors who know about Lupus.  I like to know why my blood counts are dropping."

## 2018-12-18 ENCOUNTER — Telehealth: Payer: Self-pay | Admitting: Family Medicine

## 2018-12-18 ENCOUNTER — Other Ambulatory Visit: Payer: Self-pay | Admitting: Family Medicine

## 2018-12-18 DIAGNOSIS — I10 Essential (primary) hypertension: Secondary | ICD-10-CM

## 2018-12-18 DIAGNOSIS — E039 Hypothyroidism, unspecified: Secondary | ICD-10-CM

## 2018-12-18 NOTE — Telephone Encounter (Signed)
Pt states he is needing a new order for TSH, would like a c/b once order is placed to schedule

## 2018-12-19 NOTE — Telephone Encounter (Signed)
Orders placed. Appt made.

## 2018-12-23 DIAGNOSIS — H524 Presbyopia: Secondary | ICD-10-CM | POA: Diagnosis not present

## 2018-12-23 DIAGNOSIS — H25813 Combined forms of age-related cataract, bilateral: Secondary | ICD-10-CM | POA: Diagnosis not present

## 2018-12-23 DIAGNOSIS — H02889 Meibomian gland dysfunction of unspecified eye, unspecified eyelid: Secondary | ICD-10-CM | POA: Diagnosis not present

## 2018-12-26 ENCOUNTER — Other Ambulatory Visit: Payer: Self-pay

## 2018-12-26 ENCOUNTER — Other Ambulatory Visit (INDEPENDENT_AMBULATORY_CARE_PROVIDER_SITE_OTHER): Payer: Medicare Other

## 2018-12-26 DIAGNOSIS — E039 Hypothyroidism, unspecified: Secondary | ICD-10-CM | POA: Diagnosis not present

## 2018-12-26 LAB — TSH: TSH: 2.25 u[IU]/mL (ref 0.35–4.50)

## 2019-01-02 ENCOUNTER — Ambulatory Visit (INDEPENDENT_AMBULATORY_CARE_PROVIDER_SITE_OTHER): Payer: Medicare Other

## 2019-01-02 ENCOUNTER — Other Ambulatory Visit: Payer: Self-pay

## 2019-01-02 DIAGNOSIS — Z23 Encounter for immunization: Secondary | ICD-10-CM

## 2019-01-04 ENCOUNTER — Ambulatory Visit: Payer: Medicare Other

## 2019-01-10 ENCOUNTER — Telehealth: Payer: Self-pay | Admitting: Family Medicine

## 2019-01-10 NOTE — Telephone Encounter (Signed)
Patient declined AWV at this time. SF °

## 2019-01-22 DIAGNOSIS — L249 Irritant contact dermatitis, unspecified cause: Secondary | ICD-10-CM | POA: Diagnosis not present

## 2019-01-22 DIAGNOSIS — L739 Follicular disorder, unspecified: Secondary | ICD-10-CM | POA: Diagnosis not present

## 2019-01-22 DIAGNOSIS — L570 Actinic keratosis: Secondary | ICD-10-CM | POA: Diagnosis not present

## 2019-01-22 DIAGNOSIS — L400 Psoriasis vulgaris: Secondary | ICD-10-CM | POA: Diagnosis not present

## 2019-02-07 ENCOUNTER — Encounter: Payer: Self-pay | Admitting: Hematology & Oncology

## 2019-02-07 ENCOUNTER — Inpatient Hospital Stay (HOSPITAL_BASED_OUTPATIENT_CLINIC_OR_DEPARTMENT_OTHER): Payer: Medicare Other | Admitting: Hematology & Oncology

## 2019-02-07 ENCOUNTER — Other Ambulatory Visit: Payer: Self-pay

## 2019-02-07 ENCOUNTER — Inpatient Hospital Stay: Payer: Medicare Other | Attending: Hematology & Oncology

## 2019-02-07 VITALS — BP 121/67 | HR 55 | Temp 97.1°F | Resp 19 | Wt 184.0 lb

## 2019-02-07 DIAGNOSIS — D4620 Refractory anemia with excess of blasts, unspecified: Secondary | ICD-10-CM

## 2019-02-07 DIAGNOSIS — Z79899 Other long term (current) drug therapy: Secondary | ICD-10-CM | POA: Diagnosis not present

## 2019-02-07 DIAGNOSIS — D72819 Decreased white blood cell count, unspecified: Secondary | ICD-10-CM | POA: Diagnosis not present

## 2019-02-07 DIAGNOSIS — D469 Myelodysplastic syndrome, unspecified: Secondary | ICD-10-CM | POA: Insufficient documentation

## 2019-02-07 DIAGNOSIS — Z7982 Long term (current) use of aspirin: Secondary | ICD-10-CM | POA: Insufficient documentation

## 2019-02-07 LAB — CMP (CANCER CENTER ONLY)
ALT: 14 U/L (ref 0–44)
AST: 20 U/L (ref 15–41)
Albumin: 4.4 g/dL (ref 3.5–5.0)
Alkaline Phosphatase: 82 U/L (ref 38–126)
Anion gap: 6 (ref 5–15)
BUN: 20 mg/dL (ref 8–23)
CO2: 31 mmol/L (ref 22–32)
Calcium: 9.3 mg/dL (ref 8.9–10.3)
Chloride: 101 mmol/L (ref 98–111)
Creatinine: 1.15 mg/dL (ref 0.61–1.24)
GFR, Est AFR Am: >60 mL/min (ref >60)
GFR, Estimated: >60 mL/min (ref >60)
Glucose, Bld: 135 mg/dL — ABNORMAL HIGH (ref 70–99)
Potassium: 4 mmol/L (ref 3.5–5.1)
Sodium: 138 mmol/L (ref 135–145)
Total Bilirubin: 0.4 mg/dL (ref 0.3–1.2)
Total Protein: 6.9 g/dL (ref 6.5–8.1)

## 2019-02-07 LAB — CBC WITH DIFFERENTIAL (CANCER CENTER ONLY)
Abs Immature Granulocytes: 0.01 10*3/uL (ref 0.00–0.07)
Basophils Absolute: 0 10*3/uL (ref 0.0–0.1)
Basophils Relative: 1 %
Eosinophils Absolute: 0.1 10*3/uL (ref 0.0–0.5)
Eosinophils Relative: 5 %
HCT: 35.2 % — ABNORMAL LOW (ref 39.0–52.0)
Hemoglobin: 11.9 g/dL — ABNORMAL LOW (ref 13.0–17.0)
Immature Granulocytes: 1 %
Lymphocytes Relative: 43 %
Lymphs Abs: 0.9 10*3/uL (ref 0.7–4.0)
MCH: 32.1 pg (ref 26.0–34.0)
MCHC: 33.8 g/dL (ref 30.0–36.0)
MCV: 94.9 fL (ref 80.0–100.0)
Monocytes Absolute: 0.1 10*3/uL (ref 0.1–1.0)
Monocytes Relative: 5 %
Neutro Abs: 0.9 10*3/uL — ABNORMAL LOW (ref 1.7–7.7)
Neutrophils Relative %: 45 %
Platelet Count: 171 10*3/uL (ref 150–400)
RBC: 3.71 MIL/uL — ABNORMAL LOW (ref 4.22–5.81)
RDW: 13 % (ref 11.5–15.5)
WBC Count: 2 10*3/uL — ABNORMAL LOW (ref 4.0–10.5)
nRBC: 0 % (ref 0.0–0.2)

## 2019-02-07 LAB — SAVE SMEAR(SSMR), FOR PROVIDER SLIDE REVIEW

## 2019-02-07 LAB — IRON AND TIBC
Iron: 59 ug/dL (ref 42–163)
Saturation Ratios: 23 % (ref 20–55)
TIBC: 263 ug/dL (ref 202–409)
UIBC: 203 ug/dL (ref 117–376)

## 2019-02-07 LAB — FERRITIN: Ferritin: 161 ng/mL (ref 24–336)

## 2019-02-07 LAB — RETIC PANEL
Immature Retic Fract: 3.6 % (ref 2.3–15.9)
RBC.: 3.64 MIL/uL — ABNORMAL LOW (ref 4.22–5.81)
Retic Count, Absolute: 40 10*3/uL (ref 19.0–186.0)
Retic Ct Pct: 1.1 % (ref 0.4–3.1)
Reticulocyte Hemoglobin: 36 pg (ref >27.9)

## 2019-02-07 LAB — LACTATE DEHYDROGENASE: LDH: 219 U/L — ABNORMAL HIGH (ref 98–192)
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
## 2019-03-03 ENCOUNTER — Other Ambulatory Visit: Payer: Self-pay | Admitting: Family Medicine

## 2019-03-03 DIAGNOSIS — E785 Hyperlipidemia, unspecified: Secondary | ICD-10-CM

## 2019-03-03 DIAGNOSIS — I10 Essential (primary) hypertension: Secondary | ICD-10-CM

## 2019-03-06 NOTE — Telephone Encounter (Signed)
Pt states that mail order states that he should call to get these sent to him

## 2019-03-14 ENCOUNTER — Other Ambulatory Visit: Payer: Self-pay

## 2019-03-17 ENCOUNTER — Other Ambulatory Visit: Payer: Self-pay

## 2019-03-17 ENCOUNTER — Ambulatory Visit (INDEPENDENT_AMBULATORY_CARE_PROVIDER_SITE_OTHER): Payer: Medicare Other | Admitting: Family Medicine

## 2019-03-17 ENCOUNTER — Encounter: Payer: Self-pay | Admitting: Family Medicine

## 2019-03-17 VITALS — BP 110/60 | HR 55 | Temp 97.6°F | Resp 18 | Ht 68.0 in | Wt 183.2 lb

## 2019-03-17 DIAGNOSIS — E785 Hyperlipidemia, unspecified: Secondary | ICD-10-CM | POA: Diagnosis not present

## 2019-03-17 DIAGNOSIS — I10 Essential (primary) hypertension: Secondary | ICD-10-CM

## 2019-03-17 DIAGNOSIS — E039 Hypothyroidism, unspecified: Secondary | ICD-10-CM

## 2019-03-17 DIAGNOSIS — D4620 Refractory anemia with excess of blasts, unspecified: Secondary | ICD-10-CM

## 2019-03-17 DIAGNOSIS — E1165 Type 2 diabetes mellitus with hyperglycemia: Secondary | ICD-10-CM | POA: Diagnosis not present

## 2019-03-17 LAB — COMPREHENSIVE METABOLIC PANEL
ALT: 15 U/L (ref 0–53)
AST: 22 U/L (ref 0–37)
Albumin: 4.5 g/dL (ref 3.5–5.2)
Alkaline Phosphatase: 90 U/L (ref 39–117)
BUN: 16 mg/dL (ref 6–23)
CO2: 30 mEq/L (ref 19–32)
Calcium: 9.4 mg/dL (ref 8.4–10.5)
Chloride: 101 mEq/L (ref 96–112)
Creatinine, Ser: 1.02 mg/dL (ref 0.40–1.50)
GFR: 70.27 mL/min (ref >60.00)
Glucose, Bld: 121 mg/dL — ABNORMAL HIGH (ref 70–99)
Potassium: 4.5 mEq/L (ref 3.5–5.1)
Sodium: 138 mEq/L (ref 135–145)
Total Bilirubin: 0.5 mg/dL (ref 0.2–1.2)
Total Protein: 7.6 g/dL (ref 6.0–8.3)

## 2019-03-17 LAB — LIPID PANEL
Cholesterol: 136 mg/dL (ref 0–200)
HDL: 45.5 mg/dL (ref >39.00)
LDL Cholesterol: 72 mg/dL (ref 0–99)
NonHDL: 90.71
Total CHOL/HDL Ratio: 3
Triglycerides: 96 mg/dL (ref 0.0–149.0)
VLDL: 19.2 mg/dL (ref 0.0–40.0)

## 2019-03-17 LAB — MICROALBUMIN / CREATININE URINE RATIO
Creatinine,U: 111.7 mg/dL
Microalb Creat Ratio: 0.7 mg/g (ref 0.0–30.0)
Microalb, Ur: 0.8 mg/dL (ref 0.0–1.9)

## 2019-03-17 LAB — TSH: TSH: 3.08 u[IU]/mL (ref 0.35–4.50)

## 2019-03-17 LAB — HEMOGLOBIN A1C: Hgb A1c MFr Bld: 6.1 % (ref 4.6–6.5)

## 2019-03-17 MED ORDER — LEVOTHYROXINE SODIUM 75 MCG PO TABS: 75.0000 ug | ORAL_TABLET | Freq: Every day | ORAL | 3 refills | Status: DC

## 2019-03-17 MED ORDER — NIFEDIPINE ER OSMOTIC RELEASE 60 MG PO TB24: 60.0000 mg | ORAL_TABLET | Freq: Every day | ORAL | 3 refills | Status: DC

## 2019-03-17 MED ORDER — ENALAPRIL MALEATE 10 MG PO TABS: 10.0000 mg | ORAL_TABLET | Freq: Every day | ORAL | 3 refills | Status: DC

## 2019-03-17 MED ORDER — LOVASTATIN 20 MG PO TABS: 20.0000 mg | ORAL_TABLET | Freq: Every day | ORAL | 1 refills | Status: DC

## 2019-03-17 MED ORDER — LEVOTHYROXINE SODIUM 75 MCG PO TABS: 75.0000 ug | ORAL_TABLET | Freq: Every day | ORAL | 1 refills | Status: DC

## 2019-03-17 MED ORDER — LOVASTATIN 20 MG PO TABS: 20.0000 mg | ORAL_TABLET | Freq: Every day | ORAL | 3 refills | Status: DC

## 2019-03-17 MED ORDER — LABETALOL HCL 100 MG PO TABS: 100.0000 mg | ORAL_TABLET | Freq: Two times a day (BID) | ORAL | 3 refills | Status: DC

## 2019-03-17 MED ORDER — LABETALOL HCL 100 MG PO TABS: 100.0000 mg | ORAL_TABLET | Freq: Two times a day (BID) | ORAL | 1 refills | Status: DC

## 2019-03-17 MED ORDER — NIFEDIPINE ER OSMOTIC RELEASE 60 MG PO TB24: 60.0000 mg | ORAL_TABLET | Freq: Every day | ORAL | 1 refills | Status: DC

## 2019-03-17 MED ORDER — ENALAPRIL MALEATE 10 MG PO TABS: 10.0000 mg | ORAL_TABLET | Freq: Every day | ORAL | 1 refills | Status: DC

## 2019-03-17 NOTE — Assessment & Plan Note (Signed)
Well controlled, no changes to meds. Encouraged heart healthy diet such as the DASH diet and exercise as tolerated.  °

## 2019-03-17 NOTE — Assessment & Plan Note (Signed)
Per hematology 

## 2019-03-17 NOTE — Progress Notes (Signed)
Patient ID: Trevor Cooke, male    DOB: May 04, 1940  Age: 78 y.o. MRN: [id number]    Subjective:  Subjective  HPI Trevor Cooke presents for f/u bp,   HPI HYPERTENSION   Blood pressure range-not checking   Chest pain- no      Dyspnea- no Lightheadedness- no   Edema- no  Other side effects - no   Medication compliance: good Low salt diet- yes    DIABETES    Blood Sugar ranges == good per pt   Polyuria- no New Visual problems- no  Hypoglycemic symptoms- no  Other side effects-no Medication compliance - good Last eye exam- utd    HYPERLIPIDEMIA  Medication compliance- good RUQ pain- no  Muscle aches- no Other side effects-no      Review of Systems  Constitutional: Negative for appetite change, diaphoresis, fatigue and unexpected weight change.  Eyes: Negative for pain, redness and visual disturbance.  Respiratory: Negative for cough, chest tightness, shortness of breath and wheezing.   Cardiovascular: Negative for chest pain, palpitations and leg swelling.  Endocrine: Negative for cold intolerance, heat intolerance, polydipsia, polyphagia and polyuria.  Genitourinary: Negative for difficulty urinating, dysuria and frequency.  Neurological: Negative for dizziness, light-headedness, numbness and headaches.    History Past Medical History:  Diagnosis Date  . Arthritis    in neck  . CVA (cerebral infarction) 1996  . GERD (gastroesophageal reflux disease)   . History of prediabetes   . Hyperlipidemia   . Hypertension   . MDS (myelodysplastic syndrome), low grade ([place]) 04/26/2016  . Neutropenia ([place])   . Psoriasis   . Thyroid disease     He has a past surgical history that includes Cleft palate repair (1940's).   His family history includes Cancer in his maternal uncle; Diabetes in his maternal uncle and sister; Hypertension in his father and mother; Stroke in his father.He reports that he has quit smoking. He has never used smokeless tobacco. He  reports current alcohol use. He reports that he does not use drugs.  Current Outpatient Medications on File Prior to Visit  Medication Sig Dispense Refill  . aspirin 81 MG tablet Take 81 mg by mouth daily.    Marland Kitchen Bioflavonoid Products (ESTER-C) 500-200-60 MG TABS Take 500 mg by mouth daily.    . calcipotriene-betamethasone (TACLONEX SCALP) external suspension Apply topically daily.    Marland Kitchen CALCITRIOL EX Apply topically.    . Calcium Carbonate (CALCIUM 600 PO) Take 600 mg by mouth daily.    . Cholecalciferol (VITAMIN D) 2000 units CAPS Take by mouth.    . clobetasol cream (TEMOVATE) [id number] % Apply 1 application topically as needed.     . Emollient (EUCERIN) lotion Apply topically as needed for dry skin.    . famotidine (PEPCID) 20 MG tablet Take 20 mg by mouth 2 (two) times daily.    . Multiple Vitamins-Minerals (MULTIVITAMIN WITH MINERALS) tablet Take by mouth daily.    . mupirocin ointment (BACTROBAN) 2 % Place 1 application into the nose 2 (two) times daily.    . Selenium 200 MCG TABS Take 200 mcg by mouth daily.    . Zinc 50 MG CAPS Take by mouth.     No current facility-administered medications on file prior to visit.     Objective:  Objective  Physical Exam Vitals and nursing note reviewed.  Constitutional:      General: He is sleeping.     Appearance: He is well-developed.  HENT:     Head:  Normocephalic and atraumatic.  Eyes:     Pupils: Pupils are equal, round, and reactive to light.  Neck:     Thyroid: No thyromegaly.  Cardiovascular:     Rate and Rhythm: Normal rate and regular rhythm.     Heart sounds: No murmur.  Pulmonary:     Effort: Pulmonary effort is normal. No respiratory distress.     Breath sounds: Normal breath sounds. No wheezing or rales.  Chest:     Chest wall: No tenderness.  Musculoskeletal:        General: No tenderness.     Cervical back: Normal range of motion and neck supple.  Skin:    General: Skin is warm and dry.  Neurological:     Mental  Status: He is oriented to person, place, and time.  Psychiatric:        Behavior: Behavior normal.        Thought Content: Thought content normal.        Judgment: Judgment normal.    BP 110/60 (BP Location: Right Arm, Patient Position: Sitting, Cuff Size: Normal)   Pulse (!) 55   Temp 97.6 F (36.4 C) (Temporal)   Resp 18   Ht 5\' 8"  (1.727 m)   Wt 183 lb 3.2 oz (83.1 kg)   SpO2 98%   BMI 27.86 kg/m  Wt Readings from Last 3 Encounters:  03/17/19 183 lb 3.2 oz (83.1 kg)  02/07/19 184 lb (83.5 kg)  11/07/18 185 lb (83.9 kg)     Lab Results  Component Value Date   WBC 2.0 (L) 02/07/2019   HGB 11.9 (L) 02/07/2019   HCT 35.2 (L) 02/07/2019   PLT 171 02/07/2019   GLUCOSE 135 (H) 02/07/2019   CHOL 116 09/12/2018   TRIG 131.0 09/12/2018   HDL 37.20 (L) 09/12/2018   LDLCALC 53 09/12/2018   ALT 14 02/07/2019   AST 20 02/07/2019   NA 138 02/07/2019   K 4.0 02/07/2019   CL 101 02/07/2019   CREATININE 1.15 02/07/2019   BUN 20 02/07/2019   CO2 31 02/07/2019   TSH 2.25 12/26/2018   PSA 0.05 (L) 09/07/2017   HGBA1C 6.2 03/11/2018   MICROALBUR <0.7 08/27/2014    No results found.   Assessment & Plan:  Plan  I am having Trevor Cooke maintain his aspirin, multivitamin with minerals, Zinc, Vitamin D, clobetasol cream, selenium, Ester-C, Calcium Carbonate (CALCIUM 600 PO), famotidine, calcipotriene-betamethasone, mupirocin ointment, CALCITRIOL EX, eucerin, NIFEdipine, lovastatin, levothyroxine, labetalol, and enalapril.  Meds ordered this encounter  Medications  . DISCONTD: levothyroxine (SYNTHROID) 75 MCG tablet    Sig: Take 1 tablet (75 mcg total) by mouth daily.    Dispense:  90 tablet    Refill:  1  . DISCONTD: lovastatin (MEVACOR) 20 MG tablet    Sig: Take 1 tablet (20 mg total) by mouth at bedtime.    Dispense:  90 tablet    Refill:  1  . DISCONTD: enalapril (VASOTEC) 10 MG tablet    Sig: Take 1 tablet (10 mg total) by mouth daily.    Dispense:  90 tablet     Refill:  1  . DISCONTD: NIFEdipine (PROCARDIA XL/NIFEDICAL XL) 60 MG 24 hr tablet    Sig: Take 1 tablet (60 mg total) by mouth daily.    Dispense:  90 tablet    Refill:  1  . DISCONTD: labetalol (NORMODYNE) 100 MG tablet    Sig: Take 1 tablet (100 mg total) by mouth 2 (two)  times daily.    Dispense:  180 tablet    Refill:  1  . NIFEdipine (PROCARDIA XL/NIFEDICAL XL) 60 MG 24 hr tablet    Sig: Take 1 tablet (60 mg total) by mouth daily.    Dispense:  90 tablet    Refill:  3  . lovastatin (MEVACOR) 20 MG tablet    Sig: Take 1 tablet (20 mg total) by mouth at bedtime.    Dispense:  90 tablet    Refill:  3  . levothyroxine (SYNTHROID) 75 MCG tablet    Sig: Take 1 tablet (75 mcg total) by mouth daily.    Dispense:  90 tablet    Refill:  3  . labetalol (NORMODYNE) 100 MG tablet    Sig: Take 1 tablet (100 mg total) by mouth 2 (two) times daily.    Dispense:  180 tablet    Refill:  3  . enalapril (VASOTEC) 10 MG tablet    Sig: Take 1 tablet (10 mg total) by mouth daily.    Dispense:  90 tablet    Refill:  3    Problem List Items Addressed This Visit      Unprioritized   Diabetes mellitus, type 2 ([place])    hgba1c to be checked, minimize simple carbs. Increase exercise as tolerated. Continue current meds      Relevant Medications   lovastatin (MEVACOR) 20 MG tablet   enalapril (VASOTEC) 10 MG tablet   Other Relevant Orders   Hemoglobin A1c   Microalbumin / creatinine urine ratio   Essential hypertension    Well controlled, no changes to meds. Encouraged heart healthy diet such as the DASH diet and exercise as tolerated.       Relevant Medications   NIFEdipine (PROCARDIA XL/NIFEDICAL XL) 60 MG 24 hr tablet   lovastatin (MEVACOR) 20 MG tablet   labetalol (NORMODYNE) 100 MG tablet   enalapril (VASOTEC) 10 MG tablet   Other Relevant Orders   Lipid panel   Comprehensive metabolic panel   Microalbumin / creatinine urine ratio   Hyperlipidemia    Tolerating statin, encouraged  heart healthy diet, avoid trans fats, minimize simple carbs and saturated fats. Increase exercise as tolerated      Relevant Medications   NIFEdipine (PROCARDIA XL/NIFEDICAL XL) 60 MG 24 hr tablet   lovastatin (MEVACOR) 20 MG tablet   labetalol (NORMODYNE) 100 MG tablet   enalapril (VASOTEC) 10 MG tablet   Other Relevant Orders   Lipid panel   Comprehensive metabolic panel   Hypothyroidism - Primary    Check labs today Con't meds       Relevant Medications   levothyroxine (SYNTHROID) 75 MCG tablet   labetalol (NORMODYNE) 100 MG tablet   Other Relevant Orders   TSH   MDS (myelodysplastic syndrome), low grade ([facility]) (Chronic)    Per hematology          Follow-up: Return in about 6 months (around 09/15/2019) for hypertension, hyperlipidemia.  [name], DO

## 2019-03-17 NOTE — Patient Instructions (Signed)

## 2019-03-17 NOTE — Assessment & Plan Note (Signed)
hgba1c to be checked, minimize simple carbs. Increase exercise as tolerated. Continue current meds  

## 2019-03-17 NOTE — Assessment & Plan Note (Signed)
Check labs today Con't meds

## 2019-03-17 NOTE — Assessment & Plan Note (Signed)
Tolerating statin, encouraged heart healthy diet, avoid trans fats, minimize simple carbs and saturated fats. Increase exercise as tolerated 

## 2019-04-19 ENCOUNTER — Ambulatory Visit: Payer: Medicare Other | Attending: Internal Medicine

## 2019-04-19 DIAGNOSIS — Z23 Encounter for immunization: Secondary | ICD-10-CM

## 2019-04-19 NOTE — Progress Notes (Signed)
   Covid-19 Vaccination Clinic  Name:  TURON NORTHCRAFT    MRN: [id number] DOB: Jul 31, 1940  04/19/2019  Mr. Ambrogio was observed post Covid-19 immunization for 15 minutes without incidence. He was provided with Vaccine Information Sheet and instruction to access the V-Safe system.   Mr. Kalish was instructed to call 911 with any severe reactions post vaccine: Marland Kitchen Difficulty breathing  . Swelling of your face and throat  . A fast heartbeat  . A bad rash all over your body  . Dizziness and weakness    Immunizations Administered    Name Date Dose VIS Date Route   Pfizer COVID-19 Vaccine 04/19/2019 12:59 PM 0.3 mL 03/07/2019 Intramuscular   Manufacturer: [place]   Lot: [id number]   [place]: [id number]

## 2019-05-10 ENCOUNTER — Ambulatory Visit: Payer: Medicare Other | Attending: Internal Medicine

## 2019-05-10 DIAGNOSIS — Z23 Encounter for immunization: Secondary | ICD-10-CM | POA: Insufficient documentation

## 2019-05-10 NOTE — Progress Notes (Signed)
   Covid-19 Vaccination Clinic  Name:  Trevor Cooke    MRN: [id number] DOB: 1940-12-04  05/10/2019  [name] was observed post Covid-19 immunization for 15 minutes without incidence. He was provided with Vaccine Information Sheet and instruction to access the V-Safe system.   [name] was instructed to call 911 with any severe reactions post vaccine: Marland Kitchen Difficulty breathing  . Swelling of your face and throat  . A fast heartbeat  . A bad rash all over your body  . Dizziness and weakness    Immunizations Administered    Name Date Dose VIS Date Route   Pfizer COVID-19 Vaccine 05/10/2019 12:09 PM 0.3 mL 03/07/2019 Intramuscular   Manufacturer: [place]   Lot: [id number]   [place]: [id number]

## 2019-06-06 ENCOUNTER — Encounter: Payer: Self-pay | Admitting: Hematology & Oncology

## 2019-06-06 ENCOUNTER — Inpatient Hospital Stay: Payer: Medicare Other | Attending: Hematology & Oncology | Admitting: Hematology & Oncology

## 2019-06-06 ENCOUNTER — Telehealth: Payer: Self-pay | Admitting: Hematology & Oncology

## 2019-06-06 ENCOUNTER — Inpatient Hospital Stay: Payer: Medicare Other

## 2019-06-06 ENCOUNTER — Telehealth: Payer: Self-pay | Admitting: *Deleted

## 2019-06-06 ENCOUNTER — Other Ambulatory Visit: Payer: Self-pay

## 2019-06-06 VITALS — BP 115/67 | HR 65 | Temp 97.1°F | Resp 18 | Wt 184.0 lb

## 2019-06-06 DIAGNOSIS — Z79899 Other long term (current) drug therapy: Secondary | ICD-10-CM | POA: Insufficient documentation

## 2019-06-06 DIAGNOSIS — D4620 Refractory anemia with excess of blasts, unspecified: Secondary | ICD-10-CM

## 2019-06-06 DIAGNOSIS — D469 Myelodysplastic syndrome, unspecified: Secondary | ICD-10-CM | POA: Insufficient documentation

## 2019-06-06 DIAGNOSIS — Z7982 Long term (current) use of aspirin: Secondary | ICD-10-CM | POA: Insufficient documentation

## 2019-06-06 LAB — IRON AND TIBC
Iron: 68 ug/dL (ref 42–163)
Saturation Ratios: 25 % (ref 20–55)
TIBC: 276 ug/dL (ref 202–409)
UIBC: 208 ug/dL (ref 117–376)

## 2019-06-06 LAB — CMP (CANCER CENTER ONLY)
ALT: 14 U/L (ref 0–44)
AST: 23 U/L (ref 15–41)
Albumin: 4.5 g/dL (ref 3.5–5.0)
Alkaline Phosphatase: 78 U/L (ref 38–126)
Anion gap: 5 (ref 5–15)
BUN: 19 mg/dL (ref 8–23)
CO2: 32 mmol/L (ref 22–32)
Calcium: 9.3 mg/dL (ref 8.9–10.3)
Chloride: 102 mmol/L (ref 98–111)
Creatinine: 1.18 mg/dL (ref 0.61–1.24)
GFR, Est AFR Am: >60 mL/min (ref >60)
GFR, Estimated: 58 mL/min — ABNORMAL LOW (ref >60)
Glucose, Bld: 113 mg/dL — ABNORMAL HIGH (ref 70–99)
Potassium: 3.9 mmol/L (ref 3.5–5.1)
Sodium: 139 mmol/L (ref 135–145)
Total Bilirubin: 0.5 mg/dL (ref 0.3–1.2)
Total Protein: 7.1 g/dL (ref 6.5–8.1)

## 2019-06-06 LAB — CBC WITH DIFFERENTIAL (CANCER CENTER ONLY)
Abs Immature Granulocytes: 0.02 10*3/uL (ref 0.00–0.07)
Basophils Absolute: 0 10*3/uL (ref 0.0–0.1)
Basophils Relative: 1 %
Eosinophils Absolute: 0.1 10*3/uL (ref 0.0–0.5)
Eosinophils Relative: 4 %
HCT: 33.8 % — ABNORMAL LOW (ref 39.0–52.0)
Hemoglobin: 11.4 g/dL — ABNORMAL LOW (ref 13.0–17.0)
Immature Granulocytes: 1 %
Lymphocytes Relative: 41 %
Lymphs Abs: 0.8 10*3/uL (ref 0.7–4.0)
MCH: 32.6 pg (ref 26.0–34.0)
MCHC: 33.7 g/dL (ref 30.0–36.0)
MCV: 96.6 fL (ref 80.0–100.0)
Monocytes Absolute: 0.1 10*3/uL (ref 0.1–1.0)
Monocytes Relative: 4 %
Neutro Abs: 1 10*3/uL — ABNORMAL LOW (ref 1.7–7.7)
Neutrophils Relative %: 49 %
Platelet Count: 136 10*3/uL — ABNORMAL LOW (ref 150–400)
RBC: 3.5 MIL/uL — ABNORMAL LOW (ref 4.22–5.81)
RDW: 12.9 % (ref 11.5–15.5)
WBC Count: 2 10*3/uL — ABNORMAL LOW (ref 4.0–10.5)
nRBC: 0 % (ref 0.0–0.2)

## 2019-06-06 LAB — RETICULOCYTES
Immature Retic Fract: 4.3 % (ref 2.3–15.9)
RBC.: 3.51 MIL/uL — ABNORMAL LOW (ref 4.22–5.81)
Retic Count, Absolute: 41.1 10*3/uL (ref 19.0–186.0)
Retic Ct Pct: 1.2 % (ref 0.4–3.1)

## 2019-06-06 LAB — SAVE SMEAR(SSMR), FOR PROVIDER SLIDE REVIEW

## 2019-06-06 LAB — FERRITIN: Ferritin: 171 ng/mL (ref 24–336)

## 2019-06-06 NOTE — Progress Notes (Signed)
Hematology and Oncology Follow Up Visit  Trevor Cooke [id number] 07/05/1940 79 y.o. 06/06/2019   Principle Diagnosis:   Myelodysplastic syndrome-low-grade (IPSS-R score = 1) - tp53+  Current Therapy:    Observation     Interim History:  Trevor Cooke is back for follow-up.  We see him every 4 months.  Since we last saw him, he has been doing quite well.  He has had his coronavirus vaccines.  He enjoyed the holidays.  They were somewhat quiet.  He and his wife go down to [place] all the time.  They have a condominium down there.  He has had no problems with infections.  He has had no bleeding.  He has had no rashes.  He has had no change in bowel or bladder habits.  He has had no fever.  Overall, his performance status is ECOG 1.   Medications:  Current Outpatient Medications:  .  ascorbic acid (VITAMIN C) 1000 MG tablet, Take 1,000 mg by mouth daily., Disp: , Rfl:  .  aspirin 81 MG tablet, Take 81 mg by mouth daily., Disp: , Rfl:  .  Bioflavonoid Products (ESTER-C) 500-200-60 MG TABS, Take 500 mg by mouth daily., Disp: , Rfl:  .  calcipotriene-betamethasone (TACLONEX SCALP) external suspension, Apply topically daily., Disp: , Rfl:  .  CALCITRIOL EX, Apply topically., Disp: , Rfl:  .  Calcium Carbonate (CALCIUM 600 PO), Take 600 mg by mouth daily., Disp: , Rfl:  .  Cholecalciferol (VITAMIN D) 2000 units CAPS, Take by mouth., Disp: , Rfl:  .  clobetasol cream (TEMOVATE) 9.16 %, Apply 1 application topically as needed. , Disp: , Rfl:  .  Emollient (EUCERIN) lotion, Apply topically as needed for dry skin., Disp: , Rfl:  .  enalapril (VASOTEC) 10 MG tablet, Take 1 tablet (10 mg total) by mouth daily., Disp: 90 tablet, Rfl: 3 .  famotidine (PEPCID) 20 MG tablet, Take 20 mg by mouth 2 (two) times daily., Disp: , Rfl:  .  labetalol (NORMODYNE) 100 MG tablet, Take 1 tablet (100 mg total) by mouth 2 (two) times daily., Disp: 180 tablet, Rfl: 3 .  levothyroxine (SYNTHROID) 75 MCG  tablet, Take 1 tablet (75 mcg total) by mouth daily., Disp: 90 tablet, Rfl: 3 .  lovastatin (MEVACOR) 20 MG tablet, Take 1 tablet (20 mg total) by mouth at bedtime., Disp: 90 tablet, Rfl: 3 .  Multiple Vitamins-Minerals (MULTIVITAMIN WITH MINERALS) tablet, Take by mouth daily., Disp: , Rfl:  .  mupirocin ointment (BACTROBAN) 2 %, Place 1 application into the nose 2 (two) times daily., Disp: , Rfl:  .  NIFEdipine (PROCARDIA XL/NIFEDICAL XL) 60 MG 24 hr tablet, Take 1 tablet (60 mg total) by mouth daily., Disp: 90 tablet, Rfl: 3 .  Selenium 200 MCG TABS, Take 200 mcg by mouth daily., Disp: , Rfl:  .  Zinc 50 MG CAPS, Take by mouth., Disp: , Rfl:   Allergies: No Known Allergies  Past Medical History, Surgical history, Social history, and Family History were reviewed and updated.  Review of Systems: Review of Systems  Constitutional: Negative.   HENT: Negative.   Eyes: Negative.   Respiratory: Negative.   Cardiovascular: Negative.   Gastrointestinal: Negative.   Genitourinary: Negative.   Musculoskeletal: Negative.   Skin: Negative.   Neurological: Negative.   Endo/Heme/Allergies: Negative.   Psychiatric/Behavioral: Negative.      Physical Exam:  weight is 184 lb (83.5 kg). His temporal temperature is 97.1 F (36.2 C) (abnormal). His blood pressure  is 115/67 and his pulse is 65. His respiration is 18 and oxygen saturation is 98%.   Wt Readings from Last 3 Encounters:  06/06/19 184 lb (83.5 kg)  03/17/19 183 lb 3.2 oz (83.1 kg)  02/07/19 184 lb (83.5 kg)     Physical Exam Vitals reviewed.  HENT:     Head: Normocephalic and atraumatic.  Eyes:     Pupils: Pupils are equal, round, and reactive to light.  Cardiovascular:     Rate and Rhythm: Normal rate and regular rhythm.     Heart sounds: Normal heart sounds.  Pulmonary:     Effort: Pulmonary effort is normal.     Breath sounds: Normal breath sounds.  Abdominal:     General: Bowel sounds are normal.     Palpations:  Abdomen is soft.  Musculoskeletal:        General: No tenderness or deformity. Normal range of motion.     Cervical back: Normal range of motion.  Lymphadenopathy:     Cervical: No cervical adenopathy.  Skin:    General: Skin is warm and dry.     Findings: No erythema or rash.  Neurological:     Mental Status: He is alert and oriented to person, place, and time.  Psychiatric:        Behavior: Behavior normal.        Thought Content: Thought content normal.        Judgment: Judgment normal.      Lab Results  Component Value Date   WBC 2.0 (L) 06/06/2019   HGB 11.4 (L) 06/06/2019   HCT 33.8 (L) 06/06/2019   MCV 96.6 06/06/2019   PLT 136 (L) 06/06/2019     Chemistry      Component Value Date/Time   NA 139 06/06/2019 0914   NA 144 04/26/2016 1117   K 3.9 06/06/2019 0914   K 3.9 04/26/2016 1117   CL 102 06/06/2019 0914   CL 98 04/26/2016 1117   CO2 32 06/06/2019 0914   CO2 18 04/26/2016 1117   BUN 19 06/06/2019 0914   BUN 13 04/26/2016 1117   CREATININE 1.18 06/06/2019 0914   CREATININE 1.2 04/26/2016 1117      Component Value Date/Time   CALCIUM 9.3 06/06/2019 0914   CALCIUM 9.1 04/26/2016 1117   ALKPHOS 78 06/06/2019 0914   ALKPHOS 99 (H) 04/26/2016 1117   AST 23 06/06/2019 0914   ALT 14 06/06/2019 0914   ALT 22 04/26/2016 1117   BILITOT 0.5 06/06/2019 0914         Impression and Plan: Trevor Cooke is a 79 year old white male. He has myelodysplasia. This is manifested as leukopenia.   I looked at his blood under the microscope.  I did not see any immature myeloid cells.  He had no blasts.  Overall, everything looks pretty stable.  I know his white cell count is little bit on the low side but it is not lower.  His blood smear looks reassuring to me.  I still think we get her back in 4 months.  I think this would be very reasonable.    Volanda Napoleon, MD 3/12/202110:21 AM

## 2019-06-06 NOTE — Telephone Encounter (Signed)
-----   Message from Volanda Napoleon, MD sent at 06/06/2019  1:42 PM EST ----- Call  - the iron levels are ok!!  Trevor Cooke

## 2019-06-06 NOTE — Telephone Encounter (Signed)
Pt notified of results." Iron levels are ok"Pt verbalized understanding.

## 2019-06-06 NOTE — Telephone Encounter (Signed)
Appointments scheduled calendar/letter mailed per 3/12 los

## 2019-08-06 ENCOUNTER — Other Ambulatory Visit: Payer: Self-pay | Admitting: Family Medicine

## 2019-08-06 DIAGNOSIS — E039 Hypothyroidism, unspecified: Secondary | ICD-10-CM

## 2019-08-07 ENCOUNTER — Other Ambulatory Visit: Payer: Self-pay | Admitting: Family Medicine

## 2019-08-07 DIAGNOSIS — I10 Essential (primary) hypertension: Secondary | ICD-10-CM

## 2019-08-11 DIAGNOSIS — L570 Actinic keratosis: Secondary | ICD-10-CM | POA: Diagnosis not present

## 2019-08-11 DIAGNOSIS — L821 Other seborrheic keratosis: Secondary | ICD-10-CM | POA: Diagnosis not present

## 2019-08-11 DIAGNOSIS — L820 Inflamed seborrheic keratosis: Secondary | ICD-10-CM | POA: Diagnosis not present

## 2019-08-11 DIAGNOSIS — L400 Psoriasis vulgaris: Secondary | ICD-10-CM | POA: Diagnosis not present

## 2019-08-11 DIAGNOSIS — L814 Other melanin hyperpigmentation: Secondary | ICD-10-CM | POA: Diagnosis not present

## 2019-09-15 ENCOUNTER — Encounter: Payer: Self-pay | Admitting: Family Medicine

## 2019-09-15 ENCOUNTER — Other Ambulatory Visit: Payer: Self-pay

## 2019-09-15 ENCOUNTER — Ambulatory Visit (INDEPENDENT_AMBULATORY_CARE_PROVIDER_SITE_OTHER): Payer: Medicare Other | Admitting: Family Medicine

## 2019-09-15 VITALS — BP 114/62 | HR 52 | Temp 97.0°F | Resp 18 | Ht 68.0 in | Wt 184.4 lb

## 2019-09-15 DIAGNOSIS — E1169 Type 2 diabetes mellitus with other specified complication: Secondary | ICD-10-CM | POA: Diagnosis not present

## 2019-09-15 DIAGNOSIS — Z23 Encounter for immunization: Secondary | ICD-10-CM

## 2019-09-15 DIAGNOSIS — E785 Hyperlipidemia, unspecified: Secondary | ICD-10-CM | POA: Diagnosis not present

## 2019-09-15 DIAGNOSIS — R351 Nocturia: Secondary | ICD-10-CM | POA: Diagnosis not present

## 2019-09-15 DIAGNOSIS — K219 Gastro-esophageal reflux disease without esophagitis: Secondary | ICD-10-CM | POA: Diagnosis not present

## 2019-09-15 DIAGNOSIS — I10 Essential (primary) hypertension: Secondary | ICD-10-CM | POA: Diagnosis not present

## 2019-09-15 DIAGNOSIS — D4620 Refractory anemia with excess of blasts, unspecified: Secondary | ICD-10-CM

## 2019-09-15 DIAGNOSIS — E039 Hypothyroidism, unspecified: Secondary | ICD-10-CM

## 2019-09-15 DIAGNOSIS — E291 Testicular hypofunction: Secondary | ICD-10-CM | POA: Diagnosis not present

## 2019-09-15 LAB — COMPREHENSIVE METABOLIC PANEL
ALT: 15 U/L (ref 0–53)
AST: 21 U/L (ref 0–37)
Albumin: 4.4 g/dL (ref 3.5–5.2)
Alkaline Phosphatase: 80 U/L (ref 39–117)
BUN: 17 mg/dL (ref 6–23)
CO2: 31 mEq/L (ref 19–32)
Calcium: 9.1 mg/dL (ref 8.4–10.5)
Chloride: 102 mEq/L (ref 96–112)
Creatinine, Ser: 1.09 mg/dL (ref 0.40–1.50)
GFR: 65 mL/min (ref >60.00)
Glucose, Bld: 110 mg/dL — ABNORMAL HIGH (ref 70–99)
Potassium: 4.4 mEq/L (ref 3.5–5.1)
Sodium: 139 mEq/L (ref 135–145)
Total Bilirubin: 0.6 mg/dL (ref 0.2–1.2)
Total Protein: 7 g/dL (ref 6.0–8.3)

## 2019-09-15 LAB — LIPID PANEL
Cholesterol: 130 mg/dL (ref 0–200)
HDL: 40.6 mg/dL (ref >39.00)
LDL Cholesterol: 76 mg/dL (ref 0–99)
NonHDL: 89.56
Total CHOL/HDL Ratio: 3
Triglycerides: 69 mg/dL (ref 0.0–149.0)
VLDL: 13.8 mg/dL (ref 0.0–40.0)

## 2019-09-15 LAB — PSA: PSA: 0.07 ng/mL — ABNORMAL LOW (ref 0.10–4.00)

## 2019-09-15 LAB — TSH: TSH: 2.16 u[IU]/mL (ref 0.35–4.50)

## 2019-09-15 MED ORDER — ENALAPRIL MALEATE 10 MG PO TABS: 10.0000 mg | ORAL_TABLET | Freq: Every day | ORAL | 3 refills | Status: DC

## 2019-09-15 MED ORDER — FUROSEMIDE 20 MG PO TABS: 20.0000 mg | ORAL_TABLET | Freq: Every day | ORAL | 3 refills | Status: DC

## 2019-09-15 MED ORDER — NIFEDIPINE ER OSMOTIC RELEASE 60 MG PO TB24: 60.0000 mg | ORAL_TABLET | Freq: Every day | ORAL | 3 refills | Status: DC

## 2019-09-15 MED ORDER — LOVASTATIN 20 MG PO TABS: 20.0000 mg | ORAL_TABLET | Freq: Every day | ORAL | 3 refills | Status: DC

## 2019-09-15 MED ORDER — LABETALOL HCL 100 MG PO TABS: 100.0000 mg | ORAL_TABLET | Freq: Two times a day (BID) | ORAL | 3 refills | Status: DC

## 2019-09-15 MED ORDER — LEVOTHYROXINE SODIUM 75 MCG PO TABS: 75.0000 ug | ORAL_TABLET | Freq: Every day | ORAL | 3 refills | Status: DC

## 2019-09-15 NOTE — Assessment & Plan Note (Signed)
Tolerating statin, encouraged heart healthy diet, avoid trans fats, minimize simple carbs and saturated fats. Increase exercise as tolerated 

## 2019-09-15 NOTE — Assessment & Plan Note (Signed)
Check labs today Diet controlled

## 2019-09-15 NOTE — Assessment & Plan Note (Signed)
?   Stricture Pt will call GI and f/u

## 2019-09-15 NOTE — Assessment & Plan Note (Signed)
Per hematology 

## 2019-09-15 NOTE — Assessment & Plan Note (Signed)
Well controlled, no changes to meds. Encouraged heart healthy diet such as the DASH diet and exercise as tolerated.  °

## 2019-09-15 NOTE — Progress Notes (Signed)
Patient ID: Trevor Cooke, male    DOB: 07/04/1940  Age: 79 y.o. MRN: [id number]    Subjective:  Subjective  HPI Trevor Cooke presents for f/u dm, chol and bp.  HYPERTENSION   Blood pressure range-not checking   Chest pain- no      Dyspnea- no Lightheadedness- no   Edema-no  Other side effects - no   Medication compliance: good Low salt diet- no    DIABETES    Blood Sugar ranges-good per pt   Polyuria- no New Visual problems- mo  Hypoglycemic symptoms- mo  Other side effects-no Medication compliance - good Last eye exam- due Foot exam- today   HYPERLIPIDEMIA  Medication compliance- good RUQ pain- no  Muscle aches- no Other side effects-no       Review of Systems  Constitutional: Negative.   HENT: Negative for congestion, ear pain, hearing loss, nosebleeds, postnasal drip, rhinorrhea, sinus pressure, sneezing and tinnitus.   Eyes: Negative for photophobia, discharge, itching and visual disturbance.  Respiratory: Negative.   Cardiovascular: Negative.   Gastrointestinal: Negative for abdominal distention, abdominal pain, anal bleeding, blood in stool and constipation.  Endocrine: Negative.   Genitourinary: Negative.   Musculoskeletal: Negative.   Skin: Negative.   Allergic/Immunologic: Negative.   Neurological: Negative for dizziness, weakness, light-headedness, numbness and headaches.  Psychiatric/Behavioral: Negative for agitation, confusion, decreased concentration, dysphoric mood, sleep disturbance and suicidal ideas. The patient is not nervous/anxious.     History Past Medical History:  Diagnosis Date   Arthritis    in neck   CVA (cerebral infarction) 1996   GERD (gastroesophageal reflux disease)    History of prediabetes    Hyperlipidemia    Hypertension    MDS (myelodysplastic syndrome), low grade ([place]) 04/26/2016   Neutropenia ([place])    Psoriasis    Thyroid disease     He has a past surgical history that includes Cleft  palate repair (1940's).   His family history includes Cancer in his maternal uncle; Diabetes in his maternal uncle and sister; Hypertension in his father and mother; Stroke in his father.He reports that he has quit smoking. He has never used smokeless tobacco. He reports current alcohol use. He reports that he does not use drugs.  Current Outpatient Medications on File Prior to Visit  Medication Sig Dispense Refill   ascorbic acid (VITAMIN C) 1000 MG tablet Take 1,000 mg by mouth daily.     aspirin 81 MG tablet Take 81 mg by mouth daily.     Bioflavonoid Products (ESTER-C) 500-200-60 MG TABS Take 500 mg by mouth daily.     calcipotriene-betamethasone (TACLONEX SCALP) external suspension Apply topically daily.     CALCITRIOL EX Apply topically.     Calcium Carbonate (CALCIUM 600 PO) Take 600 mg by mouth daily.     Cholecalciferol (VITAMIN D) 2000 units CAPS Take by mouth.     clobetasol cream (TEMOVATE) 3.71 % Apply 1 application topically as needed.      Emollient (EUCERIN) lotion Apply topically as needed for dry skin.     famotidine (PEPCID) 20 MG tablet Take 20 mg by mouth 2 (two) times daily.     Multiple Vitamins-Minerals (MULTIVITAMIN WITH MINERALS) tablet Take by mouth daily.     Selenium 200 MCG TABS Take 200 mcg by mouth daily.     Zinc 50 MG CAPS Take by mouth.     mupirocin ointment (BACTROBAN) 2 % Place 1 application into the nose 2 (two) times daily. (Patient not taking:  Reported on 09/15/2019)     No current facility-administered medications on file prior to visit.     Objective:  Objective  Physical Exam Vitals and nursing note reviewed.  Constitutional:      General: He is sleeping.     Appearance: He is well-developed.  HENT:     Head: Normocephalic and atraumatic.  Eyes:     Pupils: Pupils are equal, round, and reactive to light.  Neck:     Thyroid: No thyromegaly.  Cardiovascular:     Rate and Rhythm: Normal rate and regular rhythm.     Heart  sounds: No murmur heard.   Pulmonary:     Effort: Pulmonary effort is normal. No respiratory distress.     Breath sounds: Normal breath sounds. No wheezing or rales.  Chest:     Chest wall: No tenderness.  Musculoskeletal:        General: No tenderness.     Cervical back: Normal range of motion and neck supple.  Skin:    General: Skin is warm and dry.  Neurological:     Mental Status: He is oriented to person, place, and time.  Psychiatric:        Behavior: Behavior normal.        Thought Content: Thought content normal.        Judgment: Judgment normal.    Diabetic Foot Exam - Simple   Simple Foot Form Diabetic Foot exam was performed with the following findings: Yes 09/15/2019  9:15 AM  Visual Inspection No deformities, no ulcerations, no other skin breakdown bilaterally: Yes Sensation Testing Intact to touch and monofilament testing bilaterally: Yes Pulse Check Posterior Tibialis and Dorsalis pulse intact bilaterally: Yes Comments     BP 114/62 (BP Location: Right Arm, Patient Position: Sitting, Cuff Size: Normal)    Pulse (!) 52    Temp (!) 97 F (36.1 C) (Temporal)    Resp 18    Ht 5\' 8"  (1.727 m)    Wt 184 lb 6.4 oz (83.6 kg)    SpO2 97%    BMI 28.04 kg/m  Wt Readings from Last 3 Encounters:  09/15/19 184 lb 6.4 oz (83.6 kg)  06/06/19 184 lb (83.5 kg)  03/17/19 183 lb 3.2 oz (83.1 kg)     Lab Results  Component Value Date   WBC 2.0 (L) 06/06/2019   HGB 11.4 (L) 06/06/2019   HCT 33.8 (L) 06/06/2019   PLT 136 (L) 06/06/2019   GLUCOSE 113 (H) 06/06/2019   CHOL 136 03/17/2019   TRIG 96.0 03/17/2019   HDL 45.50 03/17/2019   LDLCALC 72 03/17/2019   ALT 14 06/06/2019   AST 23 06/06/2019   NA 139 06/06/2019   K 3.9 06/06/2019   CL 102 06/06/2019   CREATININE 1.18 06/06/2019   BUN 19 06/06/2019   CO2 32 06/06/2019   TSH 3.08 03/17/2019   PSA 0.05 (L) 09/07/2017   HGBA1C 6.1 03/17/2019   MICROALBUR 0.8 03/17/2019    No results found.   Assessment &  Plan:  Plan  I have changed Trevor Cooke's levothyroxine. I am also having him maintain his aspirin, multivitamin with minerals, Zinc, Vitamin D, clobetasol cream, selenium, Ester-C, Calcium Carbonate (CALCIUM 600 PO), famotidine, calcipotriene-betamethasone, mupirocin ointment, CALCITRIOL EX, eucerin, ascorbic acid, lovastatin, enalapril, labetalol, and NIFEdipine.  Meds ordered this encounter  Medications   levothyroxine (SYNTHROID) 75 MCG tablet    Sig: Take 1 tablet (75 mcg total) by mouth daily.    Dispense:  90 tablet    Refill:  3   lovastatin (MEVACOR) 20 MG tablet    Sig: Take 1 tablet (20 mg total) by mouth at bedtime.    Dispense:  90 tablet    Refill:  3   enalapril (VASOTEC) 10 MG tablet    Sig: Take 1 tablet (10 mg total) by mouth daily.    Dispense:  90 tablet    Refill:  3   labetalol (NORMODYNE) 100 MG tablet    Sig: Take 1 tablet (100 mg total) by mouth 2 (two) times daily.    Dispense:  180 tablet    Refill:  3   NIFEdipine (PROCARDIA XL/NIFEDICAL XL) 60 MG 24 hr tablet    Sig: Take 1 tablet (60 mg total) by mouth daily.    Dispense:  90 tablet    Refill:  3    Problem List Items Addressed This Visit      Unprioritized   Diabetes mellitus, type 2 ([place])    Check labs today Diet controlled      Relevant Medications   lovastatin (MEVACOR) 20 MG tablet   enalapril (VASOTEC) 10 MG tablet   Essential hypertension    Well controlled, no changes to meds. Encouraged heart healthy diet such as the DASH diet and exercise as tolerated.       Relevant Medications   lovastatin (MEVACOR) 20 MG tablet   enalapril (VASOTEC) 10 MG tablet   labetalol (NORMODYNE) 100 MG tablet   NIFEdipine (PROCARDIA XL/NIFEDICAL XL) 60 MG 24 hr tablet   Other Relevant Orders   Comprehensive metabolic panel   GERD    ? Stricture Pt will call GI and f/u      Hyperlipidemia    Tolerating statin, encouraged heart healthy diet, avoid trans fats, minimize simple carbs and  saturated fats. Increase exercise as tolerated      Relevant Medications   lovastatin (MEVACOR) 20 MG tablet   enalapril (VASOTEC) 10 MG tablet   labetalol (NORMODYNE) 100 MG tablet   NIFEdipine (PROCARDIA XL/NIFEDICAL XL) 60 MG 24 hr tablet   Other Relevant Orders   Lipid panel   Hypothyroidism    Check labs today Stable  con't synthroid      Relevant Medications   levothyroxine (SYNTHROID) 75 MCG tablet   labetalol (NORMODYNE) 100 MG tablet   Other Relevant Orders   TSH   MDS (myelodysplastic syndrome), low grade ([facility]) (Chronic)    Per hematology       Other Visit Diagnoses    Hypogonadism in male    -  Primary   Relevant Orders   PSA   Testos,Total,Free and SHBG (Male)   Nocturia       Relevant Orders   PSA   Need for pneumococcal vaccination       Relevant Orders   Pneumococcal polysaccharide vaccine 23-valent greater than or equal to 2yo subcutaneous/IM      Follow-up: Return in about 6 months (around 03/16/2020) for hypertension, hyperlipidemia, diabetes II.  [name], DO

## 2019-09-15 NOTE — Patient Instructions (Signed)
Carbohydrate Counting for Diabetes Mellitus, Adult  Carbohydrate counting is a method of keeping track of how many carbohydrates you eat. Eating carbohydrates naturally increases the amount of sugar (glucose) in the blood. Counting how many carbohydrates you eat helps keep your blood glucose within normal limits, which helps you manage your diabetes (diabetes mellitus). It is important to know how many carbohydrates you can safely have in each meal. This is different for every person. A diet and nutrition specialist (registered dietitian) can help you make a meal plan and calculate how many carbohydrates you should have at each meal and snack. Carbohydrates are found in the following foods:  Grains, such as breads and cereals.  Dried beans and soy products.  Starchy vegetables, such as potatoes, peas, and corn.  Fruit and fruit juices.  Milk and yogurt.  Sweets and snack foods, such as cake, cookies, candy, chips, and soft drinks. How do I count carbohydrates? There are two ways to count carbohydrates in food. You can use either of the methods or a combination of both. Reading "Nutrition Facts" on packaged food The "Nutrition Facts" list is included on the labels of almost all packaged foods and beverages in the [place] It includes:  The serving size.  Information about nutrients in each serving, including the grams (g) of carbohydrate per serving. To use the "Nutrition Facts":  Decide how many servings you will have.  Multiply the number of servings by the number of carbohydrates per serving.  The resulting number is the total amount of carbohydrates that you will be having. Learning standard serving sizes of other foods When you eat carbohydrate foods that are not packaged or do not include "Nutrition Facts" on the label, you need to measure the servings in order to count the amount of carbohydrates:  Measure the foods that you will eat with a food scale or measuring cup, if  needed.  Decide how many standard-size servings you will eat.  Multiply the number of servings by 15. Most carbohydrate-rich foods have about 15 g of carbohydrates per serving. ? For example, if you eat 8 oz (170 g) of strawberries, you will have eaten 2 servings and 30 g of carbohydrates (2 servings x 15 g = 30 g).  For foods that have more than one food mixed, such as soups and casseroles, you must count the carbohydrates in each food that is included. The following list contains standard serving sizes of common carbohydrate-rich foods. Each of these servings has about 15 g of carbohydrates:   hamburger bun or  English muffin.   oz (15 mL) syrup.   oz (14 g) jelly.  1 slice of bread.  1 six-inch tortilla.  3 oz (85 g) cooked rice or pasta.  4 oz (113 g) cooked dried beans.  4 oz (113 g) starchy vegetable, such as peas, corn, or potatoes.  4 oz (113 g) hot cereal.  4 oz (113 g) mashed potatoes or  of a large baked potato.  4 oz (113 g) canned or frozen fruit.  4 oz (120 mL) fruit juice.  4-6 crackers.  6 chicken nuggets.  6 oz (170 g) unsweetened dry cereal.  6 oz (170 g) plain fat-free yogurt or yogurt sweetened with artificial sweeteners.  8 oz (240 mL) milk.  8 oz (170 g) fresh fruit or one small piece of fruit.  24 oz (680 g) popped popcorn. Example of carbohydrate counting Sample meal  3 oz (85 g) chicken breast.  6 oz (170 g)   brown rice.  4 oz (113 g) corn.  8 oz (240 mL) milk.  8 oz (170 g) strawberries with sugar-free whipped topping. Carbohydrate calculation 1. Identify the foods that contain carbohydrates: ? Rice. ? Corn. ? Milk. ? Strawberries. 2. Calculate how many servings you have of each food: ? 2 servings rice. ? 1 serving corn. ? 1 serving milk. ? 1 serving strawberries. 3. Multiply each number of servings by 15 g: ? 2 servings rice x 15 g = 30 g. ? 1 serving corn x 15 g = 15 g. ? 1 serving milk x 15 g = 15 g. ? 1  serving strawberries x 15 g = 15 g. 4. Add together all of the amounts to find the total grams of carbohydrates eaten: ? 30 g + 15 g + 15 g + 15 g = 75 g of carbohydrates total. Summary  Carbohydrate counting is a method of keeping track of how many carbohydrates you eat.  Eating carbohydrates naturally increases the amount of sugar (glucose) in the blood.  Counting how many carbohydrates you eat helps keep your blood glucose within normal limits, which helps you manage your diabetes.  A diet and nutrition specialist (registered dietitian) can help you make a meal plan and calculate how many carbohydrates you should have at each meal and snack. This information is not intended to replace advice given to you by your health care provider. Make sure you discuss any questions you have with your health care provider. Document Revised: 10/05/2016 Document Reviewed: 08/25/2015 Elsevier Patient Education  2020 Elsevier Inc.  

## 2019-09-15 NOTE — Assessment & Plan Note (Signed)
Check labs today Stable  con't synthroid

## 2019-09-18 LAB — TESTOS,TOTAL,FREE AND SHBG (FEMALE)
Free Testosterone: 3.8 pg/mL — ABNORMAL LOW (ref 30.0–135.0)
Sex Hormone Binding: 32 nmol/L (ref 22–77)
Testosterone, Total, LC-MS-MS: 23 ng/dL — ABNORMAL LOW (ref 250–1100)

## 2019-09-20 ENCOUNTER — Other Ambulatory Visit: Payer: Self-pay | Admitting: Family Medicine

## 2019-09-20 DIAGNOSIS — R7989 Other specified abnormal findings of blood chemistry: Secondary | ICD-10-CM

## 2019-09-22 ENCOUNTER — Encounter: Payer: Self-pay | Admitting: Family Medicine

## 2019-10-03 ENCOUNTER — Other Ambulatory Visit: Payer: Medicare Other

## 2019-10-03 ENCOUNTER — Ambulatory Visit: Payer: Medicare Other | Admitting: Hematology & Oncology

## 2019-10-15 ENCOUNTER — Inpatient Hospital Stay (HOSPITAL_BASED_OUTPATIENT_CLINIC_OR_DEPARTMENT_OTHER): Payer: Medicare Other | Admitting: Family

## 2019-10-15 ENCOUNTER — Inpatient Hospital Stay: Payer: Medicare Other | Attending: Hematology & Oncology

## 2019-10-15 ENCOUNTER — Encounter: Payer: Self-pay | Admitting: Family

## 2019-10-15 ENCOUNTER — Other Ambulatory Visit: Payer: Self-pay

## 2019-10-15 ENCOUNTER — Inpatient Hospital Stay: Payer: Medicare Other | Admitting: Hematology & Oncology

## 2019-10-15 VITALS — BP 113/65 | HR 56 | Temp 97.8°F | Resp 18 | Ht 68.0 in | Wt 184.1 lb

## 2019-10-15 DIAGNOSIS — D469 Myelodysplastic syndrome, unspecified: Secondary | ICD-10-CM | POA: Diagnosis not present

## 2019-10-15 DIAGNOSIS — D4620 Refractory anemia with excess of blasts, unspecified: Secondary | ICD-10-CM

## 2019-10-15 DIAGNOSIS — D508 Other iron deficiency anemias: Secondary | ICD-10-CM

## 2019-10-15 LAB — RETICULOCYTES
Immature Retic Fract: 12 % (ref 2.3–15.9)
RBC.: 3.46 MIL/uL — ABNORMAL LOW (ref 4.22–5.81)
Retic Count, Absolute: 66.8 10*3/uL (ref 19.0–186.0)
Retic Ct Pct: 1.9 % (ref 0.4–3.1)

## 2019-10-15 LAB — IRON AND TIBC
Iron: 86 ug/dL (ref 42–163)
Saturation Ratios: 31 % (ref 20–55)
TIBC: 280 ug/dL (ref 202–409)
UIBC: 194 ug/dL (ref 117–376)

## 2019-10-15 LAB — CBC WITH DIFFERENTIAL (CANCER CENTER ONLY)
Abs Immature Granulocytes: 0.01 10*3/uL (ref 0.00–0.07)
Basophils Absolute: 0 10*3/uL (ref 0.0–0.1)
Basophils Relative: 0 %
Eosinophils Absolute: 0.1 10*3/uL (ref 0.0–0.5)
Eosinophils Relative: 4 %
HCT: 33.2 % — ABNORMAL LOW (ref 39.0–52.0)
Hemoglobin: 11.4 g/dL — ABNORMAL LOW (ref 13.0–17.0)
Immature Granulocytes: 0 %
Lymphocytes Relative: 37 %
Lymphs Abs: 0.8 10*3/uL (ref 0.7–4.0)
MCH: 33.1 pg (ref 26.0–34.0)
MCHC: 34.3 g/dL (ref 30.0–36.0)
MCV: 96.5 fL (ref 80.0–100.0)
Monocytes Absolute: 0.1 10*3/uL (ref 0.1–1.0)
Monocytes Relative: 4 %
Neutro Abs: 1.3 10*3/uL — ABNORMAL LOW (ref 1.7–7.7)
Neutrophils Relative %: 55 %
Platelet Count: 156 10*3/uL (ref 150–400)
RBC: 3.44 MIL/uL — ABNORMAL LOW (ref 4.22–5.81)
RDW: 13.1 % (ref 11.5–15.5)
WBC Count: 2.3 10*3/uL — ABNORMAL LOW (ref 4.0–10.5)
nRBC: 0 % (ref 0.0–0.2)

## 2019-10-15 LAB — CMP (CANCER CENTER ONLY)
ALT: 14 U/L (ref 0–44)
AST: 21 U/L (ref 15–41)
Albumin: 4.5 g/dL (ref 3.5–5.0)
Alkaline Phosphatase: 74 U/L (ref 38–126)
Anion gap: 5 (ref 5–15)
BUN: 17 mg/dL (ref 8–23)
CO2: 33 mmol/L — ABNORMAL HIGH (ref 22–32)
Calcium: 9.6 mg/dL (ref 8.9–10.3)
Chloride: 100 mmol/L (ref 98–111)
Creatinine: 1.1 mg/dL (ref 0.61–1.24)
GFR, Est AFR Am: >60 mL/min (ref >60)
GFR, Estimated: >60 mL/min (ref >60)
Glucose, Bld: 163 mg/dL — ABNORMAL HIGH (ref 70–99)
Potassium: 4.1 mmol/L (ref 3.5–5.1)
Sodium: 138 mmol/L (ref 135–145)
Total Bilirubin: 0.6 mg/dL (ref 0.3–1.2)
Total Protein: 7.4 g/dL (ref 6.5–8.1)

## 2019-10-15 LAB — LACTATE DEHYDROGENASE: LDH: 239 U/L — ABNORMAL HIGH (ref 98–192)

## 2019-10-15 LAB — SAVE SMEAR(SSMR), FOR PROVIDER SLIDE REVIEW

## 2019-10-15 LAB — FERRITIN: Ferritin: 209 ng/mL (ref 24–336)

## 2019-10-15 NOTE — Progress Notes (Signed)
Hematology and Oncology Follow Up Visit  Trevor Cooke [id number] 03-01-1941 79 y.o. 10/15/2019   Principle Diagnosis:  Myelodysplastic syndrome-low-grade (IPSS-R score = 1) - tp53+  Current Therapy:        Observation   Interim History:  Trevor Cooke is here today for his 4 month follow-up. He is doing well and has had no issues with fatigue or infection.  WBC count is stable at 2.3, ANC 1.3. We went over today's lab together in detail.  No fever, chills, n/v, cough, rash, dizziness, SOB, chest pain, palpitations, abdominal pain or changes in bowel or bladder habits.  He has not noted any episodes of bleeding. No abnormal bruising, no petechiae.  No adenopathy noted on exam.  No tenderness, numbness or tingling in his extremities.  No falls or syncopal episodes to report.  He has maintained a good appetite and is staying well hydrated. His weight is stable.  He is excited to see his grandchildren next week for the first time since the pandemic.   ECOG Performance Status: 0 - Asymptomatic  Medications:  Allergies as of 10/15/2019   No Known Allergies     Medication List       Accurate as of October 15, 2019  9:45 AM. If you have any questions, ask your nurse or doctor.        ascorbic acid 1000 MG tablet Commonly known as: VITAMIN C Take 1,000 mg by mouth daily.   aspirin 81 MG tablet Take 81 mg by mouth daily.   calcipotriene-betamethasone external suspension Commonly known as: TACLONEX SCALP Apply topically daily.   CALCITRIOL EX Apply topically.   CALCIUM 600 PO Take 600 mg by mouth daily.   clobetasol cream 0.05 % Commonly known as: TEMOVATE Apply 1 application topically as needed.   enalapril 10 MG tablet Commonly known as: VASOTEC Take 1 tablet (10 mg total) by mouth daily.   Ester-C 500-200-60 MG Tabs Take 500 mg by mouth daily.   eucerin lotion Apply topically as needed for dry skin.   famotidine 20 MG tablet Commonly known as:  PEPCID Take 20 mg by mouth 2 (two) times daily.   furosemide 20 MG tablet Commonly known as: LASIX Take 1 tablet (20 mg total) by mouth daily.   labetalol 100 MG tablet Commonly known as: NORMODYNE Take 1 tablet (100 mg total) by mouth 2 (two) times daily.   levothyroxine 75 MCG tablet Commonly known as: SYNTHROID Take 1 tablet (75 mcg total) by mouth daily.   lovastatin 20 MG tablet Commonly known as: MEVACOR Take 1 tablet (20 mg total) by mouth at bedtime.   multivitamin with minerals tablet Take by mouth daily.   mupirocin ointment 2 % Commonly known as: BACTROBAN Place 1 application into the nose 2 (two) times daily.   NIFEdipine 60 MG 24 hr tablet Commonly known as: PROCARDIA XL/NIFEDICAL XL Take 1 tablet (60 mg total) by mouth daily.   selenium 200 MCG Tabs tablet Take 200 mcg by mouth daily.   Vitamin D 50 MCG (2000 UT) Caps Take by mouth.   Zinc 50 MG Caps Take by mouth.       Allergies: No Known Allergies  Past Medical History, Surgical history, Social history, and Family History were reviewed and updated.  Review of Systems: All other 10 point review of systems is negative.   Physical Exam:  vitals were not taken for this visit.   Wt Readings from Last 3 Encounters:  09/15/19 184 lb 6.4  oz (83.6 kg)  06/06/19 184 lb (83.5 kg)  03/17/19 183 lb 3.2 oz (83.1 kg)    Ocular: Sclerae unicteric, pupils equal, round and reactive to light Ear-nose-throat: Oropharynx clear, dentition fair Lymphatic: No cervical, supraclavicular or axillary adenopathy Lungs no rales or rhonchi, good excursion bilaterally Heart regular rate and rhythm, no murmur appreciated Abd soft, nontender, positive bowel sounds, no liver or spleen tip palpated on exam, no fluid wave  MSK no focal spinal tenderness, no joint edema Neuro: non-focal, well-oriented, appropriate affect Breasts: Deferred   Lab Results  Component Value Date   WBC 2.3 (L) 10/15/2019   HGB 11.4 (L)  10/15/2019   HCT 33.2 (L) 10/15/2019   MCV 96.5 10/15/2019   PLT 156 10/15/2019   Lab Results  Component Value Date   FERRITIN 171 06/06/2019   IRON 68 06/06/2019   TIBC 276 06/06/2019   UIBC 208 06/06/2019   IRONPCTSAT 25 06/06/2019   Lab Results  Component Value Date   RETICCTPCT 1.9 10/15/2019   RBC 3.46 (L) 10/15/2019   RBC 3.44 (L) 10/15/2019   No results found for: KPAFRELGTCHN, LAMBDASER, KAPLAMBRATIO No results found for: IGGSERUM, IGA, IGMSERUM No results found for: Odetta Pink, SPEI   Chemistry      Component Value Date/Time   NA 139 09/15/2019 0932   NA 144 04/26/2016 1117   K 4.4 09/15/2019 0932   K 3.9 04/26/2016 1117   CL 102 09/15/2019 0932   CL 98 04/26/2016 1117   CO2 31 09/15/2019 0932   CO2 18 04/26/2016 1117   BUN 17 09/15/2019 0932   BUN 13 04/26/2016 1117   CREATININE 1.09 09/15/2019 0932   CREATININE 1.18 06/06/2019 0914   CREATININE 1.2 04/26/2016 1117      Component Value Date/Time   CALCIUM 9.1 09/15/2019 0932   CALCIUM 9.1 04/26/2016 1117   ALKPHOS 80 09/15/2019 0932   ALKPHOS 99 (H) 04/26/2016 1117   AST 21 09/15/2019 0932   AST 23 06/06/2019 0914   ALT 15 09/15/2019 0932   ALT 14 06/06/2019 0914   ALT 22 04/26/2016 1117   BILITOT 0.6 09/15/2019 0932   BILITOT 0.5 06/06/2019 0914       Impression and Plan: Trevor Cooke is a very pleasant 79 yo caucasian gentleman with myelodysplasia, specifically leukopenia.  His counts today remain stable with WBC count 2.3, ANC 1.3, Hgb 11.4, platelets 156,  He is asymptomatic and has no complaints at this time.  We will plan to see him again in another 4 months for MD follow-up.  He can contact our office with any questions or concerns.   Laverna Peace, NP 7/21/20219:45 AM

## 2019-10-30 DIAGNOSIS — Z20828 Contact with and (suspected) exposure to other viral communicable diseases: Secondary | ICD-10-CM | POA: Diagnosis not present

## 2019-11-06 ENCOUNTER — Telehealth: Payer: Self-pay | Admitting: Family Medicine

## 2019-11-06 NOTE — Progress Notes (Signed)
  Chronic Care Management   Note  11/06/2019 Name: Trevor Cooke MRN: [id number] DOB: 08-Nov-1940  Trevor Cooke is a 79 y.o. year old male who is a primary care patient of [name], DO. I reached out to Loleta Rose by phone today in response to a referral sent by Trevor Cooke's PCP, Carollee Herter, Alferd Apa, DO.   [name] was given information about Chronic Care Management services today including:  1. CCM service includes personalized support from designated clinical staff supervised by his physician, including individualized plan of care and coordination with other care providers 2. 24/7 contact phone numbers for assistance for urgent and routine care needs. 3. Service will only be billed when office clinical staff spend 20 minutes or more in a month to coordinate care. 4. Only one practitioner may furnish and bill the service in a calendar month. 5. The patient may stop CCM services at any time (effective at the end of the month) by phone call to the office staff.   Patient agreed to services and verbal consent obtained.   Follow up plan:   Carley Perdue [employer] Scheduler

## 2019-11-24 DIAGNOSIS — E291 Testicular hypofunction: Secondary | ICD-10-CM | POA: Diagnosis not present

## 2019-11-24 DIAGNOSIS — Q898 Other specified congenital malformations: Secondary | ICD-10-CM | POA: Diagnosis not present

## 2019-11-26 ENCOUNTER — Other Ambulatory Visit: Payer: Self-pay | Admitting: Family Medicine

## 2019-12-02 DIAGNOSIS — M8589 Other specified disorders of bone density and structure, multiple sites: Secondary | ICD-10-CM | POA: Diagnosis not present

## 2019-12-03 ENCOUNTER — Other Ambulatory Visit: Payer: Self-pay | Admitting: Family Medicine

## 2019-12-04 ENCOUNTER — Telehealth: Payer: Self-pay | Admitting: Pharmacist

## 2019-12-04 NOTE — Progress Notes (Addendum)
Chronic Care Management Pharmacy Assistant   Name: Trevor Cooke  MRN: [id number] DOB: 05/30/1940  Reason for Encounter: Initial Questions  PCP : [name], DO  Allergies:  No Known Allergies  Medications: Outpatient Encounter Medications as of 12/04/2019  Medication Sig Note  . ascorbic acid (VITAMIN C) 1000 MG tablet Take 1,000 mg by mouth daily.   Marland Kitchen aspirin 81 MG tablet Take 81 mg by mouth daily.   Marland Kitchen Bioflavonoid Products (ESTER-C) 500-200-60 MG TABS Take 500 mg by mouth daily.   . calcipotriene-betamethasone (TACLONEX SCALP) external suspension Apply topically daily.   Marland Kitchen CALCITRIOL EX Apply topically.   . Calcium Carbonate (CALCIUM 600 PO) Take 600 mg by mouth daily.   . Cholecalciferol (VITAMIN D) 2000 units CAPS Take by mouth.   . clobetasol cream (TEMOVATE) 5.27 % Apply 1 application topically as needed.    . Emollient (EUCERIN) lotion Apply topically as needed for dry skin.   Marland Kitchen enalapril (VASOTEC) 10 MG tablet Take 1 tablet (10 mg total) by mouth daily.   . famotidine (PEPCID) 20 MG tablet Take 20 mg by mouth 2 (two) times daily.   . furosemide (LASIX) 20 MG tablet Take 1 tablet (20 mg total) by mouth daily.   Marland Kitchen glucose blood (FREESTYLE PRECISION NEO TEST) test strip Check blood sugars daily   . labetalol (NORMODYNE) 100 MG tablet Take 1 tablet (100 mg total) by mouth 2 (two) times daily.   Marland Kitchen levothyroxine (SYNTHROID) 75 MCG tablet Take 1 tablet (75 mcg total) by mouth daily.   Marland Kitchen lovastatin (MEVACOR) 20 MG tablet Take 1 tablet (20 mg total) by mouth at bedtime.   . Multiple Vitamins-Minerals (MULTIVITAMIN WITH MINERALS) tablet Take by mouth daily. 05/03/2015: Received from: [facility] Received Sig: Take by mouth.  . mupirocin ointment (BACTROBAN) 2 % Place 1 application into the nose 2 (two) times daily. (Patient not taking: Reported on 09/15/2019)   . NIFEdipine (PROCARDIA XL/NIFEDICAL XL) 60 MG 24 hr tablet Take 1 tablet (60 mg total)  by mouth daily.   . Selenium 200 MCG TABS Take 200 mcg by mouth daily.   . Zinc 50 MG CAPS Take by mouth.    No facility-administered encounter medications on file as of 12/04/2019.    Current Diagnosis: Patient Active Problem List   Diagnosis Date Noted  . MDS (myelodysplastic syndrome), low grade ([place]) 04/26/2016  . Arthritis 08/01/2011  . Routine health maintenance 08/01/2011  . PSORIASIS 05/31/2010  . CHEST WALL PAIN, ACUTE 04/12/2010  . Hypogonadism male 02/12/2007  . Hypothyroidism 10/06/2006  . Diabetes mellitus, type 2 ([place]) 10/06/2006  . Hyperlipidemia 10/06/2006  . Essential hypertension 10/06/2006  . GERD 10/06/2006  . Controlled type 2 diabetes mellitus without complication ([place]) 78/24/2353    Goals Addressed   None    Have you seen any other providers since your last visit? No  Any changes in your medications or health? Yes. Patient started taking Testerone last week.  Any side effects from any medications? No  Do you have an symptoms or problems not managed by your medications? No  Any concerns about your health right now? No  Has your provider asked that you check blood pressure, blood sugar, or follow special diet at home? No  Do you get any type of exercise on a regular basis? Yes, patient states he is active  Can you think of a goal you would like to reach for your health? Patient states he  would like to loose weight and control his blood pressure so that he doesn't have to take so many different medications.  Do you have any problems getting your medications? Yes. Waiting for glucose test strips from [facility].  Is there anything that you would like to discuss during the appointment? Patient would like to discuss alternative blood pressure medication to take due to cost  Please bring medications and supplements to appointment on Monday September 13th @ 2pm   Follow-Up:  Pharmacist Review   Fanny Skates, [place] Pharmacist  Assistant [phone]  Reviewed by: De Blanch, PharmD Clinical Pharmacist [facility] [phone]

## 2019-12-06 NOTE — Chronic Care Management (AMB) (Signed)
Chronic Care Management Pharmacy  Name: Trevor Cooke  MRN: [id number] DOB: 1941/01/03  Chief Complaint/ HPI  Trevor Cooke,  79 y.o. , male presents for their Initial CCM visit with the clinical pharmacist In office.  PCP : [name], DO  Their chronic conditions include: Hypertension, Hyperlipidemia/Hx of Stroke, Diabetes, Hypothyroidism, Osteopenia, Hypogonadism, GERD, Psoriasis  Office Visits: 09/15/19: Visit w/ Dr. [name] - Question of stricture? Pt to follow up with GI. Referral to Urology for low testosterone  Consult Visit: 12/02/19: DEXA Scan - Osteopenia  11/24/19: Urology visit w/ Dr. Jenetta Loges - Hypogonadism. Low testosterone and libido. Testosterone 85m/0.5mL injection prescribed.   10/15/19: Hem/Onc visit w/ SLaverna Peace NP - MDS (Myelodysplastic syndrome-low-grade). No med changes. RTC 4 months.   08/11/19: Derm visit w/ Dr. KAlyssa Grove 06/06/19: Heme/Onc visit w/ Dr. EMarin Olp- No med changes noted. RTC 4 months.    Medications: Outpatient Encounter Medications as of 12/08/2019  Medication Sig Note  . aspirin 81 MG tablet Take 81 mg by mouth daily.   .Marland KitchenBioflavonoid Products (ESTER-C) 500-200-60 MG TABS Take 500 mg by mouth daily.   . calcipotriene-betamethasone (TACLONEX SCALP) external suspension Apply topically daily.   .Marland KitchenCALCITRIOL EX Apply topically.   . Calcium Carbonate (CALCIUM 600 PO) Take 600 mg by mouth daily.   . Cholecalciferol (VITAMIN D) 2000 units CAPS Take by mouth.   . clobetasol cream (TEMOVATE) 09.40% Apply 1 application topically as needed.    . Emollient (EUCERIN) lotion Apply topically as needed for dry skin.   .Marland Kitchenenalapril (VASOTEC) 10 MG tablet Take 1 tablet (10 mg total) by mouth daily.   . famotidine (PEPCID) 20 MG tablet Take 20 mg by mouth 2 (two) times daily.   . furosemide (LASIX) 20 MG tablet Take 1 tablet (20 mg total) by mouth daily.   .Marland Kitchenlabetalol (NORMODYNE) 100 MG tablet Take 1 tablet (100 mg total) by mouth 2  (two) times daily.   .Marland Kitchenlevothyroxine (SYNTHROID) 75 MCG tablet Take 1 tablet (75 mcg total) by mouth daily.   .Marland Kitchenlovastatin (MEVACOR) 20 MG tablet Take 1 tablet (20 mg total) by mouth at bedtime.   .Marland KitchenNIFEdipine (PROCARDIA XL/NIFEDICAL XL) 60 MG 24 hr tablet Take 1 tablet (60 mg total) by mouth daily.   . Zinc 50 MG CAPS Take by mouth.   .Marland Kitchenascorbic acid (VITAMIN C) 1000 MG tablet Take 1,000 mg by mouth daily.   .Marland Kitchenglucose blood (FREESTYLE PRECISION NEO TEST) test strip USE 1 STRIP TO CHECK GLUCOSE ONCE DAILY   . Multiple Vitamins-Minerals (MULTIVITAMIN WITH MINERALS) tablet Take by mouth daily. [facility].  . mupirocin ointment (BACTROBAN) 2 % Place 1 application into the nose 2 (two) times daily. (Patient not taking: Reported on 09/15/2019)   . Selenium 200 MCG TABS Take 200 mcg by mouth daily.    No facility-administered encounter medications on file as of 12/08/2019.   SDOH Screenings   Alcohol Screen:   . Last Alcohol Screening Score (AUDIT): Not on file  Depression (PHQ2-9): Low Risk   . PHQ-2 Score: 0  Financial Resource Strain: Medium Risk  . Difficulty of Paying Living Expenses: Somewhat hard  Food Insecurity:   . Worried About RCharity fundraiserin the Last Year: Not on file  . Ran Out of Food in the Last Year: Not on file  Housing:   . Last Housing Risk Score:  Not on file  Physical Activity:   . Days of Exercise per Week: Not on file  . Minutes of Exercise per Session: Not on file  Social Connections:   . Frequency of Communication with Friends and Family: Not on file  . Frequency of Social Gatherings with Friends and Family: Not on file  . Attends Religious Services: Not on file  . Active Member of Clubs or Organizations: Not on file  . Attends Archivist Meetings: Not on file  . Marital Status: Not on file  Stress:   . Feeling of Stress : Not on file  Tobacco Use: Medium Risk  .  Smoking Tobacco Use: Former Smoker  . Smokeless Tobacco Use: Never Used  Transportation Needs:   . Film/video editor (Medical): Not on file  . Lack of Transportation (Non-Medical): Not on file     Current Diagnosis/Assessment:  Goals Addressed            This Visit's Progress   . Chronic Care Management Pharmacy Care Plan       CARE PLAN ENTRY (see longitudinal plan of care for additional care plan information)  Current Barriers:  . Chronic Disease Management support, education, and care coordination needs related to Hypertension, Hyperlipidemia/Hx of Stroke, Diabetes, Hypothyroidism, Osteopenia, Hypogonadism, GERD, Psoriasis   Hypertension BP Readings from Last 3 Encounters:  12/08/19 124/63  10/15/19 113/65  09/15/19 114/62   . Pharmacist Clinical Goal(s): o Over the next 90 days, patient will work with PharmD and providers to maintain BP goal <140/90 . Current regimen:  . Enalapril 87m daily . Labetaolol 1094mtwice daily . Furosemide 205maily . Nifedipine XL 21m108mily . Interventions: o Discussed options of combo htn meds o Discussed purchasing nifedipine at more affordable price to maintain regimen . Patient self care activities - Over the next 90 days, patient will: o Inquire of price of nifedipine through [facility] medication regimen.   Hyperlipidemia Lab Results  Component Value Date/Time   LDLCALC 76 09/15/2019 09:32 AM   . Pharmacist Clinical Goal(s): o Over the next 90 days, patient will work with PharmD and providers to achieve LDL goal < 70 . Current regimen:  o Lovastatin 20mg17mly . Interventions: o Discussed diet and exercise extensively o Discussed exercise goal of walking 3 days a week for 30 minutes o Discussed decreasing cholesterol containing foods . Patient self care activities - Over the next 90 days, patient will: o Maintain hypertension medication regimen.  o Exercise 3 days a week for 30  minutes o Decrease cholesterol containing foods  Diabetes Lab Results  Component Value Date/Time   HGBA1C 6.1 03/17/2019 09:37 AM   HGBA1C 6.2 03/11/2018 10:12 AM   . Pharmacist Clinical Goal(s): o Over the next 90 days, patient will work with PharmD and providers to maintain A1c goal <7% . Current regimen:  o Diet and exercise management   . Interventions: o Discussed diet and exercise . Patient self care activities - Over the next 90 days, patient will: o Maintain a1c <7%  Osteopenia . Pharmacist Clinical Goal(s) o Over the next 90 days, patient will work with PharmD and providers to reduce risk of fracture due to osteopenia . Current regimen:   Calcium 600mg 55my  Vitamin D 2000 units daily . Interventions: o Discussed indication for bisphosphonate therapy noting risk of hip fracture greater than 3% o Discussed intake of 1200mg o43mlcium daily through diet and/or supplementation o Discussed intake of [phone] units of  vitamin D through supplementation  . Patient self care activities - Over the next 90 days, patient will: o Discuss bisphosphonate therapy with the remainder of care team o Ensure intake of 1223m of calcium daily  Health Maintenance  . Pharmacist Clinical Goal(s) o Over the next 90 days, patient will work with PharmD and providers to complete health maintenance screenings/vaccinations . Interventions: o Discussed follow up with gastro (Dr. GNyoka Cowden o Provide information about foods that can help raise white blood cell count (foods/supplements rich in B12 and folate) . Patient self care activities - Over the next 90 days, patient will: o Follow up with gastro o Increase intake of B12 and folate rich foods/supplements  Medication management . Pharmacist Clinical Goal(s): o Over the next 90 days, patient will work with PharmD and providers to maintain optimal medication adherence . [facility] . Interventions o Comprehensive medication review performed. o Continue current medication management strategy . Patient self care activities - Over the next 90 days, patient will: o Focus on medication adherence by filling and taking medications appropriately  o Take medications as prescribed o Report any questions or concerns to PharmD and/or provider(s)  Initial goal documentation      Social Hx:  Married 58 years. 3 sons,  11 grandkids   Hypertension   BP goal is:  <140/90  Office blood pressures are  BP Readings from Last 3 Encounters:  12/08/19 124/63  10/15/19 113/65  09/15/19 114/62   CMP Latest Ref Rng & Units 10/15/2019 09/15/2019 06/06/2019  Glucose 70 - 99 mg/dL 163(H) 110(H) 113(H)  BUN 8 - 23 mg/dL 17 17 19   Creatinine 0.61 - 1.24 mg/dL 1.10 1.09 1.18  Sodium 135 - 145 mmol/L 138 139 139  Potassium 3.5 - 5.1 mmol/L 4.1 4.4 3.9  Chloride 98 - 111 mmol/L 100 102 102  CO2 22 - 32 mmol/L 33(H) 31 32  Calcium 8.9 - 10.3 mg/dL 9.6 9.1 9.3  Total Protein 6.5 - 8.1 g/dL 7.4 7.0 7.1  Total Bilirubin 0.3 - 1.2 mg/dL 0.6 0.6 0.5  Alkaline Phos 38 - 126 U/L 74 80 78  AST 15 - 41 U/L 21 21 23   ALT 0 - 44 U/L 14 15 14    Kidney Function Lab Results  Component Value Date/Time   CREATININE 1.10 10/15/2019 09:19 AM   CREATININE 1.09 09/15/2019 09:32 AM   CREATININE 1.18 06/06/2019 09:14 AM   CREATININE 1.2 04/26/2016 11:17 AM   CREATININE 1.09 12/28/2015 08:55 AM   GFR 65.00 09/15/2019 09:32 AM   GFRNONAA >60 10/15/2019 09:19 AM   GFRNONAA 66 12/28/2015 08:55 AM   GFRAA >60 10/15/2019 09:19 AM   GFRAA 76 12/28/2015 08:55 AM   K 4.1 10/15/2019 09:19 AM   K 4.4 09/15/2019 09:32 AM   K 3.9 04/26/2016 11:17 AM   Patient checks BP at home infrequently Patient home BP readings are ranging: Unable to assess  Patient has failed these meds in the past: None noted  Patient is currently controlled on the following medications:  . Enalapril 132m daily . Labetaolol 10086mwice daily . Furosemide 86m85mily . Nifedipine XL 60mg77mly  Fill Dates Per Dispense Report - Filled Early/Appropriately . Enalapril: 08/09/19 90DS, 10/16/19 90DS . Labetaolol: 08/06/19 90DS, 10/16/19 90DS . Furosemide: 10/16/19 90DS . Nifedipine: 08/06/19 90DS, 10/16/19 90DS  Pt states that nifedipine is expensive through mail order $142.79/90DS. Pt states that he believes he could [facility] this cheaper [facility]. He presents paperwork sent from insurance that suggested amlodipine as an alternative. Pt denies having any issues with nifedipine or having ever taken amlodipine. He would like to keep his same regimen since has taken the meds for so long and his BP is at goal.   Discussed combo option possibility of enalapril/hctz with hctz replacing furosemide, but pt again would like to continue current regimen. He will inquire about the more affordable price of nifedipine  Pt denies headache, chest pain, or dizziness.   Plan -Inquire about cheaper price of nifedipine through [facility] -Continue current medications     Hyperlipidemia/Hx of Stroke   LDL goal <70  Lipid Panel     Component Value Date/Time   CHOL 130 09/15/2019 0932   TRIG 69.0 09/15/2019 0932   TRIG 74 04/03/2006 1152   HDL 40.60 09/15/2019 0932   LDLCALC 76 09/15/2019 0932    Hepatic Function Latest Ref Rng & Units 10/15/2019 09/15/2019 06/06/2019  Total Protein 6.5 - 8.1 g/dL 7.4 7.0 7.1  Albumin 3.5 - 5.0 g/dL 4.5 4.4 4.5  AST 15 - 41 U/L 21 21 23   ALT 0 - 44 U/L 14 15 14   Alk Phosphatase 38 - 126 U/L 74 80 78  Total Bilirubin 0.3 - 1.2 mg/dL 0.6 0.6 0.5  Bilirubin, Direct 0.0 - 0.3 mg/dL - - -     The ASCVD Risk score ([place]., et al., 2013) failed to calculate for the following reasons:   The 2013 ASCVD risk score is only valid for ages 74 to 54   Patient has failed these meds in past: None noted  Patient is currently uncontrolled on the following medications:   . Lovastatin 70m daily  Fill Dates Per Dispense Report - Filled Early/Appropriately  Lovastatin: 08/06/19 90DS, 10/16/19 90DS  Diet Wife does a lot of cooking, they don't eat out much B - Quiche, polenta, bacon, grits, fruits L - Cabbage, beans; PB sandwich; Banana sandwich D - same as above including sweet potatoes Snacks - Peanuts, candy Drinks - Water, pepsi every other [place], coffee (half caffienated/half de-caf), water  Exercise 2-3 days a week doing yard work  Pt and wife expressed a goal to walk 3 days a week for 30 minutes  We discussed:  diet and exercise extensively  Plan -Continue current medications  -Exercise 3 days a week for 30 minutes  Diabetes   A1c goal <7%  Recent Relevant Labs: Lab Results  Component Value Date/Time   HGBA1C 6.1 03/17/2019 09:37 AM   HGBA1C 6.2 03/11/2018 10:12 AM   GFR 65.00 09/15/2019 09:32 AM   GFR 70.27 03/17/2019 09:37 AM   MICROALBUR 0.8 03/17/2019 09:37 AM   MICROALBUR <0.7 08/27/2014 08:14 AM    Last diabetic Eye exam:  Lab Results  Component Value Date/Time   HMDIABEYEEXA No Retinopathy 12/26/2014 12:00 AM    Last diabetic Foot exam: No results found for: HMDIABFOOTEX   Patient has failed these meds in past: None noted  Patient is currently controlled on the following medications: . None  Checks BG daily. BG this morning was 89.  Plan -Continue control with diet and exercise  Hypothyroidism   Lab Results  Component Value Date/Time   TSH 2.16 09/15/2019 09:32 AM   TSH 3.08 03/17/2019 09:37 AM   FREET4 0.71 12/28/2015 08:55 AM   FREET4 0.77 09/18/2012 11:07 AM    Patient has failed these meds in past: None noted  Patient is currently controlled on the following medications:  .  Levothyroxine 55mg daily  Fill Dates Per Dispense Report - Filled Early/Appropriately  Levothyroxine: 08/07/19 90DS, 10/16/19 90DS  Plan -Continue current medications   Osteopenia    Last DEXA Scan: 12/02/19  T-Score femoral  neck: -2.2  T-Score total hip: -1.2  T-Score lumbar spine: -0.7  10-year probability of major osteoporotic fracture: 11%  10-year probability of hip fracture: 5.1%  No results found for: VD25OH   Patient is a candidate for pharmacologic treatment due to T-Score -1.0 to -2.5 and 10-year risk of hip fracture > 3%  Patient has failed these meds in past: None noted  Patient is currently controlled on the following medications:   Calcium 6033mdaily  Vitamin D 2000 units daily  We discussed:  Recommend [phone] units of vitamin D daily. Recommend 1200 mg of calcium daily from dietary and supplemental sources. Possibility of bisphosphonate therapy. Patient would like to discuss further with Dr. LoEtter Sjogrennd urologist.  Plan -Discuss possibility of bisphosphonate therapy noting most recent DEXA Scan. -Ensure intake of 120070malcium total per Renata Gambino  Hypogonadism   Followed by urology (Dr. SadJenetta LogesPatient has failed these meds in past: None noted  Patient is currently controlled on the following medications: . Testosterone 0.54m19m354mL  Started testosterone therapy 2 weeks ago.  Set for followup in one month.   Plan -Continue current medications   GERD   Patient has failed these meds in past: None noted  Patient is currently controlled on the following medications:  . Famotidine 20mg38mce daily   [facility]  Has not been in for follow up in 4-5 years. Feels he should go in for follow again.  Plan -Continue current medications   Psoriasis   Followed by Derm  Patient has failed these meds in past: None noted  Patient is currently controlled on the following medications: . Eucerin lotion . Talconex . Calcipotriene cream . Calcipotriene solution  Patient states that his dermatologist prefers not to use steroids and opts for vitamin D analogues.  Plan -Continue current medications   Vaccines                 Reviewed and  discussed patient's vaccination history.   Patient up to date on vaccines  Immunization History  Administered Date(s) Administered  . Fluad Quad(high Dose 65+) 01/02/2019  . H1N1 04/21/2008  . Influenza Split 01/30/2012  . Influenza Whole 12/28/2003, 01/02/2008, 12/29/2009  . Influenza, High Dose Seasonal PF 01/27/2013, 01/30/2014, 01/28/2015, 01/27/2016, 01/25/2017, 01/23/2018  . PFIZER SARS-COV-2 Vaccination 04/19/2019, 05/10/2019  . Pneumococcal Conjugate-13 04/07/2013  . Pneumococcal Polysaccharide-23 04/12/2010, 09/15/2019  . Td 09/25/1995  . Tdap 10/21/2010  . Zoster 01/30/2012  . Zoster Recombinat (Shingrix) 11/12/2017, 02/07/2018    Medication Management   [facility] Uses pill box? Yes Pt fills his own pill boxes. He fills his pill boxes out 7 weeks at a time. Pt endorses 100% compliance  He likes to shop around for the best prices.  He likes to receive print prescriptions so he can choose the most cost efficient place to fill his medications  Discussed how this fragmentation in pharmacy filling could cause a problem in the future, but patient feels he has a solid system in place  Miscellaneous Meds Vitamin C 1000mg 60mirin 81mg  85mr C Multivitamin  Mupirocin Selenium 200mg  Z15m50mg   P32m-Continue current medication management strategy   Follow up:  3 month phone visit   De Blanch, PharmD Clinical Pharmacist [facility] [phone]

## 2019-12-08 ENCOUNTER — Ambulatory Visit: Payer: Medicare Other | Admitting: Pharmacist

## 2019-12-08 ENCOUNTER — Other Ambulatory Visit: Payer: Self-pay

## 2019-12-08 VITALS — BP 124/63 | HR 62 | Temp 97.9°F | Wt 188.4 lb

## 2019-12-08 DIAGNOSIS — I10 Essential (primary) hypertension: Secondary | ICD-10-CM

## 2019-12-08 DIAGNOSIS — E785 Hyperlipidemia, unspecified: Secondary | ICD-10-CM

## 2019-12-08 DIAGNOSIS — E1169 Type 2 diabetes mellitus with other specified complication: Secondary | ICD-10-CM

## 2019-12-09 NOTE — Patient Instructions (Addendum)
Visit Information  Goals Addressed            This Visit's Progress   . Chronic Care Management Pharmacy Care Plan       CARE PLAN ENTRY (see longitudinal plan of care for additional care plan information)  Current Barriers:  . Chronic Disease Management support, education, and care coordination needs related to Hypertension, Hyperlipidemia/Hx of Stroke, Diabetes, Hypothyroidism, Osteopenia, Hypogonadism, GERD, Psoriasis   Hypertension BP Readings from Last 3 Encounters:  12/08/19 124/63  10/15/19 113/65  09/15/19 114/62   . Pharmacist Clinical Goal(s): o Over the next 90 days, patient will work with PharmD and providers to maintain BP goal <140/90 . Current regimen:  . Enalapril 10mg  daily . Labetaolol 100mg  twice daily . Furosemide 20mg  daily . Nifedipine XL 60mg  daily . Interventions: o Discussed options of combo htn meds o Discussed purchasing nifedipine at more affordable price to maintain regimen . Patient self care activities - Over the next 90 days, patient will: o Inquire of price of nifedipine through [employer] hypertension medication regimen.   Hyperlipidemia Lab Results  Component Value Date/Time   LDLCALC 76 09/15/2019 09:32 AM   . Pharmacist Clinical Goal(s): o Over the next 90 days, patient will work with PharmD and providers to achieve LDL goal < 70 . Current regimen:  o Lovastatin 20mg  daily . Interventions: o Discussed diet and exercise extensively o Discussed exercise goal of walking 3 days a week for 30 minutes o Discussed decreasing cholesterol containing foods . Patient self care activities - Over the next 90 days, patient will: o Maintain hypertension medication regimen.  o Exercise 3 days a week for 30 minutes o Decrease cholesterol containing foods  Diabetes Lab Results  Component Value Date/Time   HGBA1C 6.1 03/17/2019 09:37 AM   HGBA1C 6.2 03/11/2018 10:12 AM   . Pharmacist Clinical Goal(s): o Over the next 90 days,  patient will work with PharmD and providers to maintain A1c goal <7% . Current regimen:  o Diet and exercise management   . Interventions: o Discussed diet and exercise . Patient self care activities - Over the next 90 days, patient will: o Maintain a1c <7%  Osteopenia . Pharmacist Clinical Goal(s) o Over the next 90 days, patient will work with PharmD and providers to reduce risk of fracture due to osteopenia . Current regimen:   Calcium 600mg  daily  Vitamin D 2000 units daily . Interventions: o Discussed indication for bisphosphonate therapy noting risk of hip fracture greater than 3% o Discussed intake of 1200mg  of calcium daily through diet and/or supplementation o Discussed intake of [phone] units of vitamin D through supplementation  . Patient self care activities - Over the next 90 days, patient will: o Discuss bisphosphonate therapy with the remainder of care team o Ensure intake of 1200mg  of calcium daily  Health Maintenance  . Pharmacist Clinical Goal(s) o Over the next 90 days, patient will work with PharmD and providers to complete health maintenance screenings/vaccinations . Interventions: o Discussed follow up with gastro (Dr. Nyoka Cowden) o Provide information about foods that can help raise white blood cell count (foods/supplements rich in B12 and folate) . Patient self care activities - Over the next 90 days, patient will: o Follow up with gastro o Increase intake of B12 and folate rich foods/supplements  Medication management . Pharmacist Clinical Goal(s): o Over the next 90 days, patient will work with PharmD and providers to maintain optimal medication adherence . [facility] . Interventions o Comprehensive medication review performed. o Continue current medication management strategy . Patient self care activities - Over the next 90 days, patient will: o Focus on medication adherence by filling and taking medications  appropriately  o Take medications as prescribed o Report any questions or concerns to PharmD and/or provider(s)  Initial goal documentation        Mr. Reddy was given information about Chronic Care Management services today including:  1. CCM service includes personalized support from designated clinical staff supervised by his physician, including individualized plan of care and coordination with other care providers 2. 24/7 contact phone numbers for assistance for urgent and routine care needs. 3. Standard insurance, coinsurance, copays and deductibles apply for chronic care management only during months in which we provide at least 20 minutes of these services. Most insurances cover these services at 100%, however patients may be responsible for any copay, coinsurance and/or deductible if applicable. This service may help you avoid the need for more expensive face-to-face services. 4. Only one practitioner may furnish and bill the service in a calendar month. 5. The patient may stop CCM services at any time (effective at the end of the month) by phone call to the office staff.  Patient agreed to services and verbal consent obtained.   The patient verbalized understanding of instructions provided today and agreed to receive a mailed copy of patient instruction and/or educational materials. Telephone follow up appointment with pharmacy team member scheduled for:  03/08/2020  Melvenia Beam Neviah Braud, PharmD Clinical Pharmacist [facility] [phone]   Cholesterol Content in Foods Cholesterol is a waxy, fat-like substance that helps to carry fat in the blood. The body needs cholesterol in small amounts, but too much cholesterol can cause damage to the arteries and heart. Most people should eat less than 200 milligrams (mg) of cholesterol a Regis Wiland. Foods with cholesterol  Cholesterol is found in animal-based foods, such as meat, seafood, and dairy. Generally,  low-fat dairy and lean meats have less cholesterol than full-fat dairy and fatty meats. The milligrams of cholesterol per serving (mg per serving) of common cholesterol-containing foods are listed below. Meat and other proteins  Egg -- one large whole egg has 186 mg.  Veal shank -- 4 oz has 141 mg.  Lean ground [place] (93% lean) -- 4 oz has 118 mg.  Fat-trimmed lamb loin -- 4 oz has 106 mg.  Lean ground beef (90% lean) -- 4 oz has 100 mg.  Lobster -- 3.5 oz has 90 mg.  Pork loin chops -- 4 oz has 86 mg.  Canned salmon -- 3.5 oz has 83 mg.  Fat-trimmed beef top loin -- 4 oz has 78 mg.  Frankfurter -- 1 frank (3.5 oz) has 77 mg.  Crab -- 3.5 oz has 71 mg.  Roasted chicken without skin, white meat -- 4 oz has 66 mg.  Light bologna -- 2 oz has 45 mg.  Deli-cut [place] -- 2 oz has 31 mg.  Canned tuna -- 3.5 oz has 31 mg.  Berniece Salines -- 1 oz has 29 mg.  Oysters and mussels (raw) -- 3.5 oz has 25 mg.  Mackerel -- 1 oz has 22 mg.  Trout -- 1 oz has 20 mg.  Pork sausage -- 1 link (1 oz) has 17 mg.  Salmon -- 1 oz has 16 mg.  Tilapia -- 1 oz has 14 mg. Dairy  Soft-serve ice cream --  cup (4 oz) has 103 mg.  Whole-milk yogurt -- 1 cup (8 oz) has 29 mg.  Cheddar cheese -- 1 oz has 28 mg.  American cheese -- 1 oz has 28 mg.  Whole milk -- 1 cup (8 oz) has 23 mg.  2% milk -- 1 cup (8 oz) has 18 mg.  Cream cheese -- 1 tablespoon (Tbsp) has 15 mg.  Cottage cheese --  cup (4 oz) has 14 mg.  Low-fat (1%) milk -- 1 cup (8 oz) has 10 mg.  Sour cream -- 1 Tbsp has 8.5 mg.  Low-fat yogurt -- 1 cup (8 oz) has 8 mg.  Nonfat Greek yogurt -- 1 cup (8 oz) has 7 mg.  Half-and-half cream -- 1 Tbsp has 5 mg. Fats and oils  Cod liver oil -- 1 tablespoon (Tbsp) has 82 mg.  Butter -- 1 Tbsp has 15 mg.  Lard -- 1 Tbsp has 14 mg.  Bacon grease -- 1 Tbsp has 14 mg.  Mayonnaise -- 1 Tbsp has 5-10 mg.  Margarine -- 1 Tbsp has 3-10 mg. Exact amounts of cholesterol in these  foods may vary depending on specific ingredients and brands. Foods without cholesterol Most plant-based foods do not have cholesterol unless you combine them with a food that has cholesterol. Foods without cholesterol include:  Grains and cereals.  Vegetables.  Fruits.  Vegetable oils, such as olive, canola, and sunflower oil.  Legumes, such as peas, beans, and lentils.  Nuts and seeds.  Egg whites. Summary  The body needs cholesterol in small amounts, but too much cholesterol can cause damage to the arteries and heart.  Most people should eat less than 200 milligrams (mg) of cholesterol a Jolanta Cabeza. This information is not intended to replace advice given to you by your health care provider. Make sure you discuss any questions you have with your health care provider. Document Revised: 02/23/2017 Document Reviewed: 11/07/2016 Elsevier Patient Education  [place].

## 2019-12-23 ENCOUNTER — Other Ambulatory Visit: Payer: Self-pay

## 2019-12-23 DIAGNOSIS — E1169 Type 2 diabetes mellitus with other specified complication: Secondary | ICD-10-CM

## 2019-12-23 DIAGNOSIS — E785 Hyperlipidemia, unspecified: Secondary | ICD-10-CM

## 2019-12-23 DIAGNOSIS — I10 Essential (primary) hypertension: Secondary | ICD-10-CM

## 2020-01-05 DIAGNOSIS — N521 Erectile dysfunction due to diseases classified elsewhere: Secondary | ICD-10-CM | POA: Diagnosis not present

## 2020-01-05 DIAGNOSIS — Q898 Other specified congenital malformations: Secondary | ICD-10-CM | POA: Diagnosis not present

## 2020-01-05 DIAGNOSIS — E291 Testicular hypofunction: Secondary | ICD-10-CM | POA: Diagnosis not present

## 2020-01-07 ENCOUNTER — Ambulatory Visit: Payer: Medicare Other

## 2020-01-08 ENCOUNTER — Telehealth: Payer: Self-pay | Admitting: Pharmacist

## 2020-01-08 ENCOUNTER — Ambulatory Visit (INDEPENDENT_AMBULATORY_CARE_PROVIDER_SITE_OTHER): Payer: Medicare Other | Admitting: *Deleted

## 2020-01-08 ENCOUNTER — Other Ambulatory Visit: Payer: Self-pay

## 2020-01-08 DIAGNOSIS — Z23 Encounter for immunization: Secondary | ICD-10-CM

## 2020-01-08 NOTE — Progress Notes (Addendum)
Chronic Care Management Pharmacy Assistant   Name: Trevor Cooke  MRN: [id number] DOB: [birth year]  Reason for Encounter: Medication Review/ General Adherence   PCP : [name], DO  Allergies:  No Known Allergies  Medications: Outpatient Encounter Medications as of 01/08/2020  Medication Sig Note  . ascorbic acid (VITAMIN C) 1000 MG tablet Take 1,000 mg by mouth daily.   Marland Kitchen aspirin 81 MG tablet Take 81 mg by mouth daily.   Marland Kitchen Bioflavonoid Products (ESTER-C) 500-200-60 MG TABS Take 500 mg by mouth daily.   . calcipotriene-betamethasone (TACLONEX SCALP) external suspension Apply topically daily.   Marland Kitchen CALCITRIOL EX Apply topically.   . Calcium Carbonate (CALCIUM 600 PO) Take 600 mg by mouth daily.   . Cholecalciferol (VITAMIN D) 2000 units CAPS Take by mouth.   . clobetasol cream (TEMOVATE) 0.97 % Apply 1 application topically as needed.    . Emollient (EUCERIN) lotion Apply topically as needed for dry skin.   Marland Kitchen enalapril (VASOTEC) 10 MG tablet Take 1 tablet (10 mg total) by mouth daily.   . famotidine (PEPCID) 20 MG tablet Take 20 mg by mouth 2 (two) times daily.   . furosemide (LASIX) 20 MG tablet Take 1 tablet (20 mg total) by mouth daily.   Marland Kitchen glucose blood (FREESTYLE PRECISION NEO TEST) test strip USE 1 STRIP TO CHECK GLUCOSE ONCE DAILY   . labetalol (NORMODYNE) 100 MG tablet Take 1 tablet (100 mg total) by mouth 2 (two) times daily.   Marland Kitchen levothyroxine (SYNTHROID) 75 MCG tablet Take 1 tablet (75 mcg total) by mouth daily.   Marland Kitchen lovastatin (MEVACOR) 20 MG tablet Take 1 tablet (20 mg total) by mouth at bedtime.   . Multiple Vitamins-Minerals (MULTIVITAMIN WITH MINERALS) tablet Take by mouth daily. 05/03/2015: Received from: [facility] Received Sig: Take by mouth.  . mupirocin ointment (BACTROBAN) 2 % Place 1 application into the nose 2 (two) times daily. (Patient not taking: Reported on 09/15/2019)   . NIFEdipine (PROCARDIA XL/NIFEDICAL XL) 60 MG 24  hr tablet Take 1 tablet (60 mg total) by mouth daily.   . Selenium 200 MCG TABS Take 200 mcg by mouth daily.   . Zinc 50 MG CAPS Take by mouth.    No facility-administered encounter medications on file as of 01/08/2020.    Current Diagnosis: Patient Active Problem List   Diagnosis Date Noted  . MDS (myelodysplastic syndrome), low grade ([place]) 04/26/2016  . Arthritis 08/01/2011  . Routine health maintenance 08/01/2011  . PSORIASIS 05/31/2010  . CHEST WALL PAIN, ACUTE 04/12/2010  . Hypogonadism male 02/12/2007  . Hypothyroidism 10/06/2006  . Diabetes mellitus, type 2 ([place]) 10/06/2006  . Hyperlipidemia 10/06/2006  . Essential hypertension 10/06/2006  . GERD 10/06/2006  . Controlled type 2 diabetes mellitus without complication ([place]) 35/32/9924    Goals Addressed   None     Follow-Up:  Pharmacist Review   Called patient and discussed medication adherence with patient, no issues at this time with current medication.  Patient states he has started walking for 2-3 days a week for 30-40 minutes.  Patient states he is very active.  He was doing yard work when when called today.    Patient states he would like an after visit summary of last CCM visit 12-08-19 mailed to him.  Patient states he got his flu shot today.  Patient denies ED visit since his last CPP follow up. Patient denies any side effect with his medication. Patient denies  any problems with his current pharmacy.  [name], [facility] [phone]  Noted patient would like to receive AVS from CCM visit. Will send to patient. Reviewed by: De Blanch, PharmD Clinical Pharmacist [facility] [phone]

## 2020-01-22 DIAGNOSIS — L400 Psoriasis vulgaris: Secondary | ICD-10-CM | POA: Diagnosis not present

## 2020-01-22 DIAGNOSIS — D225 Melanocytic nevi of trunk: Secondary | ICD-10-CM | POA: Diagnosis not present

## 2020-02-09 DIAGNOSIS — H25813 Combined forms of age-related cataract, bilateral: Secondary | ICD-10-CM | POA: Diagnosis not present

## 2020-02-09 DIAGNOSIS — H524 Presbyopia: Secondary | ICD-10-CM | POA: Diagnosis not present

## 2020-02-21 ENCOUNTER — Other Ambulatory Visit: Payer: Self-pay | Admitting: Family Medicine

## 2020-02-23 ENCOUNTER — Other Ambulatory Visit: Payer: Self-pay

## 2020-02-23 ENCOUNTER — Encounter: Payer: Self-pay | Admitting: Hematology & Oncology

## 2020-02-23 ENCOUNTER — Inpatient Hospital Stay (HOSPITAL_BASED_OUTPATIENT_CLINIC_OR_DEPARTMENT_OTHER): Payer: Medicare Other | Admitting: Hematology & Oncology

## 2020-02-23 ENCOUNTER — Telehealth: Payer: Self-pay | Admitting: Hematology & Oncology

## 2020-02-23 ENCOUNTER — Inpatient Hospital Stay: Payer: Medicare Other | Attending: Hematology & Oncology

## 2020-02-23 VITALS — BP 135/66 | HR 55 | Temp 97.5°F | Resp 17 | Wt 193.2 lb

## 2020-02-23 DIAGNOSIS — D508 Other iron deficiency anemias: Secondary | ICD-10-CM

## 2020-02-23 DIAGNOSIS — D4620 Refractory anemia with excess of blasts, unspecified: Secondary | ICD-10-CM | POA: Diagnosis not present

## 2020-02-23 DIAGNOSIS — D72819 Decreased white blood cell count, unspecified: Secondary | ICD-10-CM | POA: Insufficient documentation

## 2020-02-23 DIAGNOSIS — D469 Myelodysplastic syndrome, unspecified: Secondary | ICD-10-CM | POA: Insufficient documentation

## 2020-02-23 DIAGNOSIS — Z7982 Long term (current) use of aspirin: Secondary | ICD-10-CM | POA: Insufficient documentation

## 2020-02-23 DIAGNOSIS — Z79899 Other long term (current) drug therapy: Secondary | ICD-10-CM | POA: Diagnosis not present

## 2020-02-23 LAB — CMP (CANCER CENTER ONLY)
ALT: 13 U/L (ref 0–44)
AST: 24 U/L (ref 15–41)
Albumin: 4.3 g/dL (ref 3.5–5.0)
Alkaline Phosphatase: 78 U/L (ref 38–126)
Anion gap: 8 (ref 5–15)
BUN: 12 mg/dL (ref 8–23)
CO2: 31 mmol/L (ref 22–32)
Calcium: 9.5 mg/dL (ref 8.9–10.3)
Chloride: 100 mmol/L (ref 98–111)
Creatinine: 1.24 mg/dL (ref 0.61–1.24)
GFR, Estimated: 59 mL/min — ABNORMAL LOW (ref >60)
Glucose, Bld: 178 mg/dL — ABNORMAL HIGH (ref 70–99)
Potassium: 4.1 mmol/L (ref 3.5–5.1)
Sodium: 139 mmol/L (ref 135–145)
Total Bilirubin: 0.5 mg/dL (ref 0.3–1.2)
Total Protein: 7.4 g/dL (ref 6.5–8.1)

## 2020-02-23 LAB — CBC WITH DIFFERENTIAL (CANCER CENTER ONLY)
Abs Immature Granulocytes: 0.01 10*3/uL (ref 0.00–0.07)
Basophils Absolute: 0 10*3/uL (ref 0.0–0.1)
Basophils Relative: 0 %
Eosinophils Absolute: 0.1 10*3/uL (ref 0.0–0.5)
Eosinophils Relative: 2 %
HCT: 39.8 % (ref 39.0–52.0)
Hemoglobin: 13.5 g/dL (ref 13.0–17.0)
Immature Granulocytes: 0 %
Lymphocytes Relative: 44 %
Lymphs Abs: 1.1 10*3/uL (ref 0.7–4.0)
MCH: 33.1 pg (ref 26.0–34.0)
MCHC: 33.9 g/dL (ref 30.0–36.0)
MCV: 97.5 fL (ref 80.0–100.0)
Monocytes Absolute: 0.1 10*3/uL (ref 0.1–1.0)
Monocytes Relative: 4 %
Neutro Abs: 1.2 10*3/uL — ABNORMAL LOW (ref 1.7–7.7)
Neutrophils Relative %: 50 %
Platelet Count: 192 10*3/uL (ref 150–400)
RBC: 4.08 MIL/uL — ABNORMAL LOW (ref 4.22–5.81)
RDW: 13.2 % (ref 11.5–15.5)
WBC Count: 2.5 10*3/uL — ABNORMAL LOW (ref 4.0–10.5)
nRBC: 0 % (ref 0.0–0.2)

## 2020-02-23 LAB — RETICULOCYTES
Immature Retic Fract: 7.7 % (ref 2.3–15.9)
RBC.: 4.11 MIL/uL — ABNORMAL LOW (ref 4.22–5.81)
Retic Count, Absolute: 51.8 10*3/uL (ref 19.0–186.0)
Retic Ct Pct: 1.3 % (ref 0.4–3.1)

## 2020-02-23 LAB — SAVE SMEAR(SSMR), FOR PROVIDER SLIDE REVIEW

## 2020-02-23 LAB — IRON AND TIBC
Iron: 89 ug/dL (ref 42–163)
Saturation Ratios: 28 % (ref 20–55)
TIBC: 315 ug/dL (ref 202–409)
UIBC: 226 ug/dL (ref 117–376)

## 2020-02-23 LAB — LACTATE DEHYDROGENASE: LDH: 209 U/L — ABNORMAL HIGH (ref 98–192)

## 2020-02-23 LAB — FERRITIN: Ferritin: 56 ng/mL (ref 24–336)

## 2020-02-23 NOTE — Progress Notes (Signed)
Hematology and Oncology Follow Up Visit  Trevor Cooke [id number] January 08, 1941 79 y.o. 02/23/2020   Principle Diagnosis:   Myelodysplastic syndrome-low-grade (IPSS-R score = 1) - tp53+  Current Therapy:    Observation     Interim History:  Trevor Cooke is back for follow-up.  We last saw him back in July.  Since then, he has been started on testosterone injections.  He gets this weekly.  This is clearly helped.  His hemoglobin is much better.  He feels better.  He feels more active.  He had a very nice Thanksgiving.  He has had no problems with bowels or bladder.  He has had no bleeding.  He has had no cough or shortness of breath.  As always, he and his wife do travel.  His last iron studies back in July showed a ferritin of 209 with iron saturation of 31%.  Overall, his performance status is ECOG 1.   Medications:  Current Outpatient Medications:  .  testosterone cypionate (DEPOTESTOSTERONE CYPIONATE) 200 MG/ML injection, Inject 75 mg into the muscle once a week., Disp: , Rfl:  .  ascorbic acid (VITAMIN C) 1000 MG tablet, Take 1,000 mg by mouth daily., Disp: , Rfl:  .  aspirin 81 MG tablet, Take 81 mg by mouth daily., Disp: , Rfl:  .  Bioflavonoid Products (ESTER-C) 500-200-60 MG TABS, Take 500 mg by mouth daily., Disp: , Rfl:  .  calcipotriene-betamethasone (TACLONEX SCALP) external suspension, Apply topically daily., Disp: , Rfl:  .  CALCITRIOL EX, Apply topically., Disp: , Rfl:  .  Calcium Carbonate (CALCIUM 600 PO), Take 600 mg by mouth daily., Disp: , Rfl:  .  Cholecalciferol (VITAMIN D) 2000 units CAPS, Take by mouth., Disp: , Rfl:  .  clobetasol cream (TEMOVATE) 0.16 %, Apply 1 application topically as needed. , Disp: , Rfl:  .  Emollient (EUCERIN) lotion, Apply topically as needed for dry skin., Disp: , Rfl:  .  enalapril (VASOTEC) 10 MG tablet, Take 1 tablet (10 mg total) by mouth daily., Disp: 90 tablet, Rfl: 3 .  famotidine (PEPCID) 20 MG tablet, Take 20 mg  by mouth 2 (two) times daily., Disp: , Rfl:  .  FREESTYLE PRECISION NEO TEST test strip, USE 1 STRIP TO CHECK GLUCOSE ONCE DAILY, Disp: 50 each, Rfl: 0 .  furosemide (LASIX) 20 MG tablet, Take 1 tablet (20 mg total) by mouth daily., Disp: 90 tablet, Rfl: 3 .  labetalol (NORMODYNE) 100 MG tablet, Take 1 tablet (100 mg total) by mouth 2 (two) times daily., Disp: 180 tablet, Rfl: 3 .  levothyroxine (SYNTHROID) 75 MCG tablet, Take 1 tablet (75 mcg total) by mouth daily., Disp: 90 tablet, Rfl: 3 .  lovastatin (MEVACOR) 20 MG tablet, Take 1 tablet (20 mg total) by mouth at bedtime., Disp: 90 tablet, Rfl: 3 .  Multiple Vitamins-Minerals (MULTIVITAMIN WITH MINERALS) tablet, Take by mouth daily., Disp: , Rfl:  .  mupirocin ointment (BACTROBAN) 2 %, Place 1 application into the nose 2 (two) times daily. (Patient not taking: Reported on 09/15/2019), Disp: , Rfl:  .  NIFEdipine (PROCARDIA XL/NIFEDICAL XL) 60 MG 24 hr tablet, Take 1 tablet (60 mg total) by mouth daily., Disp: 90 tablet, Rfl: 3 .  Selenium 200 MCG TABS, Take 200 mcg by mouth daily., Disp: , Rfl:  .  Zinc 50 MG CAPS, Take by mouth., Disp: , Rfl:   Allergies: No Known Allergies  Past Medical History, Surgical history, Social history, and Family History were reviewed and  updated.  Review of Systems: Review of Systems  Constitutional: Negative.   HENT: Negative.   Eyes: Negative.   Respiratory: Negative.   Cardiovascular: Negative.   Gastrointestinal: Negative.   Genitourinary: Negative.   Musculoskeletal: Negative.   Skin: Negative.   Neurological: Negative.   Endo/Heme/Allergies: Negative.   Psychiatric/Behavioral: Negative.      Physical Exam:  weight is 193 lb 4 oz (87.7 kg). His oral temperature is 97.5 F (36.4 C) (abnormal). His blood pressure is 135/66 and his pulse is 55 (abnormal). His respiration is 17 and oxygen saturation is 100%.   Wt Readings from Last 3 Encounters:  02/23/20 193 lb 4 oz (87.7 kg)  12/08/19 188 lb  6.4 oz (85.5 kg)  10/15/19 184 lb 1.3 oz (83.5 kg)     Physical Exam Vitals reviewed.  HENT:     Head: Normocephalic and atraumatic.  Eyes:     Pupils: Pupils are equal, round, and reactive to light.  Cardiovascular:     Rate and Rhythm: Normal rate and regular rhythm.     Heart sounds: Normal heart sounds.  Pulmonary:     Effort: Pulmonary effort is normal.     Breath sounds: Normal breath sounds.  Abdominal:     General: Bowel sounds are normal.     Palpations: Abdomen is soft.  Musculoskeletal:        General: No tenderness or deformity. Normal range of motion.     Cervical back: Normal range of motion.  Lymphadenopathy:     Cervical: No cervical adenopathy.  Skin:    General: Skin is warm and dry.     Findings: No erythema or rash.  Neurological:     Mental Status: He is alert and oriented to person, place, and time.  Psychiatric:        Behavior: Behavior normal.        Thought Content: Thought content normal.        Judgment: Judgment normal.      Lab Results  Component Value Date   WBC 2.5 (L) 02/23/2020   HGB 13.5 02/23/2020   HCT 39.8 02/23/2020   MCV 97.5 02/23/2020   PLT 192 02/23/2020     Chemistry      Component Value Date/Time   NA 139 02/23/2020 0845   NA 144 04/26/2016 1117   K 4.1 02/23/2020 0845   K 3.9 04/26/2016 1117   CL 100 02/23/2020 0845   CL 98 04/26/2016 1117   CO2 31 02/23/2020 0845   CO2 18 04/26/2016 1117   BUN 12 02/23/2020 0845   BUN 13 04/26/2016 1117   CREATININE 1.24 02/23/2020 0845   CREATININE 1.2 04/26/2016 1117      Component Value Date/Time   CALCIUM 9.5 02/23/2020 0845   CALCIUM 9.1 04/26/2016 1117   ALKPHOS 78 02/23/2020 0845   ALKPHOS 99 (H) 04/26/2016 1117   AST 24 02/23/2020 0845   ALT 13 02/23/2020 0845   ALT 22 04/26/2016 1117   BILITOT 0.5 02/23/2020 0845      Impression and Plan: Trevor Cooke is a 79 year old white male. He has myelodysplasia. This is manifested as leukopenia.   I looked at  his blood under the microscope.  I did not see any immature myeloid cells.  He has no nucleated red blood cells.  His platelets look fine and are well granulated.  He had no blasts.  Clearly, the testosterone has been a very positive effect on his bone marrow.  I am really  not surprised by this given his age.  I am just glad that his quality of life is doing better.  At this point, we can now get him back in 6 months.  I just feel good about his blood counts.   Volanda Napoleon, MD 11/29/20219:28 AM

## 2020-02-23 NOTE — Telephone Encounter (Signed)
Appointments scheduled patient has My Chart Access for appt info per 11/29 los

## 2020-03-08 ENCOUNTER — Ambulatory Visit: Payer: Medicare Other | Admitting: Pharmacist

## 2020-03-08 DIAGNOSIS — I10 Essential (primary) hypertension: Secondary | ICD-10-CM

## 2020-03-08 DIAGNOSIS — E1169 Type 2 diabetes mellitus with other specified complication: Secondary | ICD-10-CM

## 2020-03-08 DIAGNOSIS — E785 Hyperlipidemia, unspecified: Secondary | ICD-10-CM

## 2020-03-08 NOTE — Patient Instructions (Addendum)
Visit Information  Goals Addressed            This Visit's Progress   . Chronic Care Management Pharmacy Care Plan       CARE PLAN ENTRY (see longitudinal plan of care for additional care plan information)  Current Barriers:  . Chronic Disease Management support, education, and care coordination needs related to Hypertension, Hyperlipidemia/Hx of Stroke, Diabetes, Hypothyroidism, Osteopenia, Hypogonadism, GERD, Psoriasis   Hypertension BP Readings from Last 3 Encounters:  02/23/20 135/66  12/08/19 124/63  10/15/19 113/65   . Pharmacist Clinical Goal(s): o Over the next 90 days, patient will work with PharmD and providers to maintain BP goal <140/90 . Current regimen:  . Enalapril 10mg  daily . Labetaolol 100mg  twice daily . Furosemide 20mg  daily . Nifedipine XL 60mg  daily . Interventions: o Discussed options of combo htn meds o Discussed purchasing nifedipine at more affordable price to maintain regimen ([facility] getting [facility] . Patient self care activities - Over the next 90 days, patient will: o Maintain hypertension medication regimen.   Hyperlipidemia Lab Results  Component Value Date/Time   LDLCALC 76 09/15/2019 09:32 AM   . Pharmacist Clinical Goal(s): o Over the next 90 days, patient will work with PharmD and providers to achieve LDL goal < 70 . Current regimen:  o Lovastatin 20mg  daily . Interventions: o Discussed diet and exercise extensively o Discussed exercise goal of walking 3 days a week for 30 minutes o Discussed decreasing cholesterol containing foods . Patient self care activities - Over the next 90 days, patient will: o Maintain hypertension medication regimen.  o Exercise 3 days a week for 30 minutes o Decrease cholesterol containing foods  Diabetes Lab Results  Component Value Date/Time   HGBA1C 6.1 03/17/2019 09:37 AM   HGBA1C 6.2 03/11/2018 10:12 AM   . Pharmacist Clinical Goal(s): o Over the next 90 days, patient will work with  PharmD and providers to maintain A1c goal <7% . Current regimen:  o Diet and exercise management   . Interventions: o Discussed diet and exercise . Patient self care activities - Over the next 90 days, patient will: o Maintain a1c <7%  Osteopenia . Pharmacist Clinical Goal(s) o Over the next 90 days, patient will work with PharmD and providers to reduce risk of fracture due to osteopenia . Current regimen:   Calcium 600mg  daily  Vitamin D 2000 units daily . Interventions: o Discussed indication for bisphosphonate therapy noting risk of hip fracture greater than 3% o Discussed intake of 1200mg  of calcium daily through diet and/or supplementation o Discussed intake of [phone] units of vitamin D through supplementation  . Patient self care activities - Over the next 90 days, patient will: o Discuss bisphosphonate therapy with the remainder of care team o Ensure intake of 1200mg  of calcium daily  Health Maintenance  . Pharmacist Clinical Goal(s) o Over the next 90 days, patient will work with PharmD and providers to complete health maintenance screenings/vaccinations . Interventions: o Discussed follow up with gastro (Dr. Nyoka Cowden) o Provide information about foods that can help raise white blood cell count (foods/supplements rich in B12 and folate) . Patient self care activities - Over the next 90 days, patient will: o Follow up with gastro o Increase intake of B12 and folate rich foods/supplements  Medication management . Pharmacist Clinical Goal(s): o Over the next 90 days, patient will work with PharmD and providers to maintain optimal medication adherence . [facility] . Interventions  o Comprehensive medication review performed. o Continue current medication management strategy . Patient self care activities - Over the next 90 days, patient will: o Focus on medication adherence by filling and taking medications appropriately  o Take  medications as prescribed o Report any questions or concerns to PharmD and/or provider(s)  Please see past updates related to this goal by clicking on the "Past Updates" button in the selected goal         The patient verbalized understanding of instructions, educational materials, and care plan provided today and agreed to receive a mailed copy of patient instructions, educational materials, and care plan.   Telephone follow up appointment with pharmacy team member scheduled for: 06/07/2020  Melvenia Beam Dulce Martian, [facility]

## 2020-03-08 NOTE — Chronic Care Management (AMB) (Signed)
Chronic Care Management Pharmacy  Name: CHANSE KAGEL  MRN: [id number] DOB: [birth year]  Chief Complaint/ HPI  Trevor Cooke,  79 y.o. , male presents for their Follow-Up CCM visit with the clinical pharmacist In office.  PCP : [name], DO  Their chronic conditions include: Hypertension, Hyperlipidemia/Hx of Stroke, Diabetes, Hypothyroidism, Osteopenia, Hypogonadism, GERD, Psoriasis  Office Visits: None since last CCM visit on 12/08/19.   Consult Visit: 02/23/20: Heme/Onc visit w/ Dr. Marin Olp - Testosterone has improved pt's quality of life and blood counts. No med changes noted. RTC 6 months.   02/09/20: Ophthalmology visit w/ Dr. Malcolm Metro - new Rx for CLs and specs. RTC 1 year.  01/05/20: Urology visit w/ Dr. Jenetta Loges - Hypogonadism prescribed. Testosterole 78m/0.5mL weekly   Medications: Outpatient Encounter Medications as of 03/08/2020  Medication Sig Note   ascorbic acid (VITAMIN C) 1000 MG tablet Take 1,000 mg by mouth daily.    aspirin 81 MG tablet Take 81 mg by mouth daily.    Bioflavonoid Products (ESTER-C) 500-200-60 MG TABS Take 500 mg by mouth daily.    calcipotriene-betamethasone (TACLONEX SCALP) external suspension Apply topically daily.    CALCITRIOL EX Apply topically.    Calcium Carbonate (CALCIUM 600 PO) Take 600 mg by mouth daily.    Cholecalciferol (VITAMIN D) 2000 units CAPS Take by mouth.    clobetasol cream (TEMOVATE) 00.73% Apply 1 application topically as needed.     Emollient (EUCERIN) lotion Apply topically as needed for dry skin.    enalapril (VASOTEC) 10 MG tablet Take 1 tablet (10 mg total) by mouth daily.    famotidine (PEPCID) 20 MG tablet Take 20 mg by mouth 2 (two) times daily.    FREESTYLE PRECISION NEO TEST test strip USE 1 STRIP TO CHECK GLUCOSE ONCE DAILY    furosemide (LASIX) 20 MG tablet Take 1 tablet (20 mg total) by mouth daily.    labetalol (NORMODYNE) 100 MG tablet Take 1 tablet (100 mg total) by  mouth 2 (two) times daily.    levothyroxine (SYNTHROID) 75 MCG tablet Take 1 tablet (75 mcg total) by mouth daily.    lovastatin (MEVACOR) 20 MG tablet Take 1 tablet (20 mg total) by mouth at bedtime.    Multiple Vitamins-Minerals (MULTIVITAMIN WITH MINERALS) tablet Take by mouth daily. [facility].   mupirocin ointment (BACTROBAN) 2 % Place 1 application into the nose 2 (two) times daily. (Patient not taking: Reported on 09/15/2019)    NIFEdipine (PROCARDIA XL/NIFEDICAL XL) 60 MG 24 hr tablet Take 1 tablet (60 mg total) by mouth daily.    Selenium 200 MCG TABS Take 200 mcg by mouth daily.    testosterone cypionate (DEPOTESTOSTERONE CYPIONATE) 200 MG/ML injection Inject 75 mg into the muscle once a week.    Zinc 50 MG CAPS Take by mouth.    No facility-administered encounter medications on file as of 03/08/2020.   SDOH Screenings   Alcohol Screen: Not on file  Depression (PHQ2-9): Low Risk    PHQ-2 Score: 0  Financial Resource Strain: Low Risk    Difficulty of Paying Living Expenses: Not very hard  Food Insecurity: Not on file  Housing: Not on file  Physical Activity: Not on file  Social Connections: Not on file  Stress: Not on file  Tobacco Use: Medium Risk   Smoking Tobacco Use: Former Smoker   Smokeless Tobacco Use: Never Used  Transportation Needs: Not on file  Current Diagnosis/Assessment:  Goals Addressed            This Visit's Progress    Chronic Care Management Pharmacy Care Plan       CARE PLAN ENTRY (see longitudinal plan of care for additional care plan information)  Current Barriers:   Chronic Disease Management support, education, and care coordination needs related to Hypertension, Hyperlipidemia/Hx of Stroke, Diabetes, Hypothyroidism, Osteopenia, Hypogonadism, GERD, Psoriasis   Hypertension BP Readings from Last 3 Encounters:  02/23/20 135/66  12/08/19 124/63   10/15/19 113/65    Pharmacist Clinical Goal(s): o Over the next 90 days, patient will work with PharmD and providers to maintain BP goal <140/90  Current regimen:   Enalapril 52m daily  Labetaolol 1028mtwice daily  Furosemide 2035maily  Nifedipine XL 83m20mily  Interventions: o Discussed options of combo htn meds o Discussed purchasing nifedipine at more affordable price to maintain regimen ([facility]_  Patient self care activities - Over the next 90 days, patient will: o Maintain hypertension medication regimen.   Hyperlipidemia Lab Results  Component Value Date/Time   LDLCALC 76 09/15/2019 09:32 AM    Pharmacist Clinical Goal(s): o Over the next 90 days, patient will work with PharmD and providers to achieve LDL goal < 70  Current regimen:  o Lovastatin 20mg67mly  Interventions: o Discussed diet and exercise extensively o Discussed exercise goal of walking 3 days a week for 30 minutes o Discussed decreasing cholesterol containing foods  Patient self care activities - Over the next 90 days, patient will: o Maintain hypertension medication regimen.  o Exercise 3 days a week for 30 minutes o Decrease cholesterol containing foods  Diabetes Lab Results  Component Value Date/Time   HGBA1C 6.1 03/17/2019 09:37 AM   HGBA1C 6.2 03/11/2018 10:12 AM    Pharmacist Clinical Goal(s): o Over the next 90 days, patient will work with PharmD and providers to maintain A1c goal <7%  Current regimen:  o Diet and exercise management    Interventions: o Discussed diet and exercise  Patient self care activities - Over the next 90 days, patient will: o Maintain a1c <7%  Osteopenia  Pharmacist Clinical Goal(s) o Over the next 90 days, patient will work with PharmD and providers to reduce risk of fracture due to osteopenia  Current regimen:   Calcium 600mg 89my  Vitamin D 2000 units daily  Interventions: o Discussed indication for  bisphosphonate therapy noting risk of hip fracture greater than 3% o Discussed intake of 1200mg o34mlcium daily through diet and/or supplementation o Discussed intake of [phone] units of vitamin D through supplementation   Patient self care activities - Over the next 90 days, patient will: o Discuss bisphosphonate therapy with the remainder of care team o Ensure intake of 1200mg of84mcium daily  Health Maintenance   Pharmacist Clinical Goal(s) o Over the next 90 days, patient will work with PharmD and providers to complete health maintenance screenings/vaccinations  Interventions: o Discussed follow up with gastro (Dr. Green) oNyoka Cowdenide information about foods that can help raise white blood cell count (foods/supplements rich in B12 and folate)  Patient self care activities - Over the next 90 days, patient will: o Follow up with gastro o Increase intake of B12 and folate rich foods/supplements  Medication management  Pharmacist Clinical Goal(s): o Over the next 90 days, patient will work with PharmD and providers to maintain optimal medication adherence  [facility]  Interventions o Comprehensive medication review performed. o Continue current medication management strategy  Patient self care activities - Over the next 90 days, patient will: o Focus on medication adherence by filling and taking medications appropriately  o Take medications as prescribed o Report any questions or concerns to PharmD and/or provider(s)  Please see past updates related to this goal by clicking on the "Past Updates" button in the selected goal       Social Hx:  Married 58 years. 3 sons,  11 grandkids   Hypertension   BP goal is:  <140/90  Office blood pressures are  BP Readings from Last 3 Encounters:  02/23/20 135/66  12/08/19 124/63  10/15/19 113/65   CMP Latest Ref Rng & Units 02/23/2020 10/15/2019 09/15/2019  Glucose 70 - 99 mg/dL 178(H) 163(H)  110(H)  BUN 8 - 23 mg/dL _0 Creatinine 0.61 - 1.24 mg/dL 1.24 1.10 1.09  Sodium 135 - 145 mmol/L 139 138 139  Potassium 3.5 - 5.1 mmol/L 4.1 4.1 4.4  Chloride 98 - 111 mmol/L 100 100 102  CO2 22 - 32 mmol/L 31 33(H) 31  Calcium 8.9 - 10.3 mg/dL 9.5 9.6 9.1  Total Protein 6.5 - 8.1 g/dL 7.4 7.4 7.0  Total Bilirubin 0.3 - 1.2 mg/dL 0.5 0.6 0.6  Alkaline Phos 38 - 126 U/L 78 74 80  AST 15 - 41 U/L _1 ALT 0 - 44 U/L _2 Kidney Function Lab Results  Component Value Date/Time   CREATININE 1.24 02/23/2020 08:45 AM   CREATININE 1.10 10/15/2019 09:19 AM   CREATININE 1.2 04/26/2016 11:17 AM   CREATININE 1.09 12/28/2015 08:55 AM   GFR 65.00 09/15/2019 09:32 AM   GFRNONAA 59 (L) 02/23/2020 08:45 AM   GFRNONAA 66 12/28/2015 08:55 AM   GFRAA >60 10/15/2019 09:19 AM   GFRAA 76 12/28/2015 08:55 AM   K 4.1 02/23/2020 08:45 AM   K 4.1 10/15/2019 09:19 AM   K 3.9 04/26/2016 11:17 AM   Patient checks BP at home infrequently Patient home BP readings are ranging: Unable to assess  Patient has failed these meds in the past: None noted  Patient is currently controlled on the following medications:   Enalapril 23m daily  Labetaolol 1052mtwice daily  Furosemide 2082maily  Nifedipine XL 15m43mily  Fill Dates Per Dispense Report - Filled Early/Appropriately  Enalapril: 08/09/19 90DS, 10/16/19 90DS  Labetaolol: 08/06/19 90DS, 10/16/19 90DS  Furosemide: 10/16/19 90DS  Nifedipine: 08/06/19 90DS, 10/16/19 90DS  Pt states that nifedipine is expensive through mail order $142.79/90DS. Pt states that he believes he could get [facility] cheaper [facility] using [facility]. He presents paperwork sent from insurance that suggested amlodipine as an alternative. Pt denies having any issues with nifedipine or having ever taken amlodipine. He would like to keep his same regimen since has taken the meds for so long and his BP is at goal.   Discussed combo option possibility of enalapril/hctz with  hctz replacing furosemide, but pt again would like to continue current regimen. He will inquire about the more affordable price of nifedipine  Pt denies headache, chest pain, or dizziness.   Update 03/08/20 Able to get nifedipine more affordably? Yes, [facility], now cost about $40   Plan -Continue current medications     Hyperlipidemia/Hx of Stroke   LDL goal <70  Lipid Panel     Component Value Date/Time   CHOL 130 09/15/2019 0932  TRIG 69.0 09/15/2019 0932   TRIG 74 04/03/2006 1152   HDL 40.60 09/15/2019 0932   LDLCALC 76 09/15/2019 0932    Hepatic Function Latest Ref Rng & Units 02/23/2020 10/15/2019 09/15/2019  Total Protein 6.5 - 8.1 g/dL 7.4 7.4 7.0  Albumin 3.5 - 5.0 g/dL 4.3 4.5 4.4  AST 15 - 41 U/L _0 ALT 0 - 44 U/L _1 Alk Phosphatase 38 - 126 U/L 78 74 80  Total Bilirubin 0.3 - 1.2 mg/dL 0.5 0.6 0.6  Bilirubin, Direct 0.0 - 0.3 mg/dL - - -     The ASCVD Risk score (Latta., et al., 2013) failed to calculate for the following reasons:   The 2013 ASCVD risk score is only valid for ages 22 to 74   Patient has failed these meds in past: None noted  Patient is currently uncontrolled on the following medications:   Lovastatin 21m daily  Fill Dates Per Dispense Report - Filled Early/Appropriately  Lovastatin: 08/06/19 90DS, 10/16/19 90DS  Diet Wife does a lot of cooking, they don't eat out much B - Quiche, polenta, bacon, grits, fruits L - Cabbage, beans; PB sandwich; Banana sandwich D - same as above including sweet potatoes Snacks - Peanuts, candy Drinks - Water, pepsi every other Marla Pouliot, coffee (half caffienated/half de-caf), water  Exercise 2-3 days a week doing yard work  Pt and wife expressed a goal to walk 3 days a week for 30 minutes  We discussed:  diet and exercise extensively   Update 03/08/20 Pt would benefit from updated lipid panel. If LDL still >70, consider changing to rosuvastatin 243mdaily   Plan -Continue  current medications  -Exercise 3 days a week for 30 minutes  Diabetes   A1c goal <7%  Recent Relevant Labs: Lab Results  Component Value Date/Time   HGBA1C 6.1 03/17/2019 09:37 AM   HGBA1C 6.2 03/11/2018 10:12 AM   GFR 65.00 09/15/2019 09:32 AM   GFR 70.27 03/17/2019 09:37 AM   MICROALBUR 0.8 03/17/2019 09:37 AM   MICROALBUR <0.7 08/27/2014 08:14 AM    Last diabetic Eye exam:  Lab Results  Component Value Date/Time   HMDIABEYEEXA No Retinopathy 12/26/2014 12:00 AM    Last diabetic Foot exam: No results found for: HMDIABFOOTEX   Patient has failed these meds in past: None noted  Patient is currently controlled on the following medications:  None  Checks BG daily. BG this morning was 89.  Update 03/08/20 Pt would benefit from updated a1c.   Plan -Continue control with diet and exercise   Osteopenia    Last DEXA Scan: 12/02/19  T-Score femoral neck: -2.2  T-Score total hip: -1.2  T-Score lumbar spine: -0.7  10-year probability of major osteoporotic fracture: 11%  10-year probability of hip fracture: 5.1%  No results found for: VD25OH   Patient is a candidate for pharmacologic treatment due to T-Score -1.0 to -2.5 and 10-year risk of hip fracture > 3%  Patient has failed these meds in past: None noted  Patient is currently controlled on the following medications:   Calcium 60021maily  Vitamin D 2000 units daily  We discussed:  Recommend [phone] units of vitamin D daily. Recommend 1200 mg of calcium daily from dietary and supplemental sources. Possibility of bisphosphonate therapy. Patient would like to discuss further with Dr. LowEtter Sjogrend urologist.  Update 03/08/20 Discuss bisphosphonate therapy with urologist and/or Dr. LowEtter Sjogrenas not, will plan to tomorrow  Plan -Discuss possibility of  bisphosphonate therapy noting most recent DEXA Scan. -Ensure intake of 1212m calcium total per Zyrus Hetland  Hypogonadism   Followed by urology (Dr. SJenetta Loges  Patient has  failed these meds in past: None noted  Patient is currently controlled on the following medications:  Testosterone 0.767m0.375mL  Started testosterone therapy 2 weeks ago.  Set for followup in one month.  Update 03/08/20 Doing well with therapy  Plan -Continue current medications   Medication Management   [facility] Uses pill box? Yes Pt fills his own pill boxes. He fills his pill boxes out 7 weeks at a time. Pt endorses 100% compliance  He likes to shop around for the best prices.  He likes to receive print prescriptions so he can choose the most cost efficient place to fill his medications  Discussed how this fragmentation in pharmacy filling could cause a problem in the future, but patient feels he has a solid system in place  Miscellaneous Meds Vitamin C 100076mAspirin 67m4mster C Multivitamin  Mupirocin Selenium 200mg53mnc 50mg 34man -Continue current medication management strategy   Follow up:  3 month phone visit   KaneshDe BlanchmD, BCACP Clinical Pharmacist [phone]

## 2020-03-09 ENCOUNTER — Encounter: Payer: Self-pay | Admitting: Family Medicine

## 2020-03-09 ENCOUNTER — Ambulatory Visit (INDEPENDENT_AMBULATORY_CARE_PROVIDER_SITE_OTHER): Payer: Medicare Other | Admitting: Family Medicine

## 2020-03-09 ENCOUNTER — Other Ambulatory Visit: Payer: Self-pay

## 2020-03-09 VITALS — BP 140/82 | HR 72 | Temp 97.7°F | Resp 18 | Ht 68.0 in | Wt 191.2 lb

## 2020-03-09 DIAGNOSIS — R7989 Other specified abnormal findings of blood chemistry: Secondary | ICD-10-CM

## 2020-03-09 DIAGNOSIS — E1165 Type 2 diabetes mellitus with hyperglycemia: Secondary | ICD-10-CM

## 2020-03-09 DIAGNOSIS — D4620 Refractory anemia with excess of blasts, unspecified: Secondary | ICD-10-CM | POA: Diagnosis not present

## 2020-03-09 DIAGNOSIS — D46Z Other myelodysplastic syndromes: Secondary | ICD-10-CM

## 2020-03-09 DIAGNOSIS — N529 Male erectile dysfunction, unspecified: Secondary | ICD-10-CM

## 2020-03-09 DIAGNOSIS — E119 Type 2 diabetes mellitus without complications: Secondary | ICD-10-CM

## 2020-03-09 DIAGNOSIS — I10 Essential (primary) hypertension: Secondary | ICD-10-CM

## 2020-03-09 DIAGNOSIS — Z0000 Encounter for general adult medical examination without abnormal findings: Secondary | ICD-10-CM

## 2020-03-09 DIAGNOSIS — R351 Nocturia: Secondary | ICD-10-CM

## 2020-03-09 DIAGNOSIS — E039 Hypothyroidism, unspecified: Secondary | ICD-10-CM

## 2020-03-09 DIAGNOSIS — E785 Hyperlipidemia, unspecified: Secondary | ICD-10-CM | POA: Diagnosis not present

## 2020-03-09 LAB — LIPID PANEL
Cholesterol: 109 mg/dL (ref 0–200)
HDL: 32 mg/dL — ABNORMAL LOW (ref >39.00)
LDL Cholesterol: 63 mg/dL (ref 0–99)
NonHDL: 76.76
Total CHOL/HDL Ratio: 3
Triglycerides: 69 mg/dL (ref 0.0–149.0)
VLDL: 13.8 mg/dL (ref 0.0–40.0)

## 2020-03-09 LAB — CBC WITH DIFFERENTIAL/PLATELET
Basophils Absolute: 0 10*3/uL (ref 0.0–0.1)
Basophils Relative: 0.6 % (ref 0.0–3.0)
Eosinophils Absolute: 0.1 10*3/uL (ref 0.0–0.7)
Eosinophils Relative: 2.9 % (ref 0.0–5.0)
HCT: 40.9 % (ref 39.0–52.0)
Hemoglobin: 13.9 g/dL (ref 13.0–17.0)
Lymphocytes Relative: 39.5 % (ref 12.0–46.0)
Lymphs Abs: 1 10*3/uL (ref 0.7–4.0)
MCHC: 34 g/dL (ref 30.0–36.0)
MCV: 97 fl (ref 78.0–100.0)
Monocytes Absolute: 0.1 10*3/uL (ref 0.1–1.0)
Monocytes Relative: 5.3 % (ref 3.0–12.0)
Neutro Abs: 1.3 10*3/uL — ABNORMAL LOW (ref 1.4–7.7)
Neutrophils Relative %: 51.7 % (ref 43.0–77.0)
Platelets: 163 10*3/uL (ref 150.0–400.0)
RBC: 4.22 Mil/uL (ref 4.22–5.81)
RDW: 14.5 % (ref 11.5–15.5)
WBC: 2.4 10*3/uL — ABNORMAL LOW (ref 4.0–10.5)

## 2020-03-09 LAB — PSA: PSA: 0.4 ng/mL (ref 0.10–4.00)

## 2020-03-09 LAB — HEMOGLOBIN A1C: Hgb A1c MFr Bld: 5.9 % (ref 4.6–6.5)

## 2020-03-09 LAB — COMPREHENSIVE METABOLIC PANEL
ALT: 15 U/L (ref 0–53)
AST: 21 U/L (ref 0–37)
Albumin: 4.3 g/dL (ref 3.5–5.2)
Alkaline Phosphatase: 78 U/L (ref 39–117)
BUN: 9 mg/dL (ref 6–23)
CO2: 32 mEq/L (ref 19–32)
Calcium: 8.8 mg/dL (ref 8.4–10.5)
Chloride: 100 mEq/L (ref 96–112)
Creatinine, Ser: 1.08 mg/dL (ref 0.40–1.50)
GFR: 64.59 mL/min (ref >60.00)
Glucose, Bld: 106 mg/dL — ABNORMAL HIGH (ref 70–99)
Potassium: 4.3 mEq/L (ref 3.5–5.1)
Sodium: 139 mEq/L (ref 135–145)
Total Bilirubin: 0.6 mg/dL (ref 0.2–1.2)
Total Protein: 7.1 g/dL (ref 6.0–8.3)

## 2020-03-09 LAB — TSH: TSH: 4.38 u[IU]/mL (ref 0.35–4.50)

## 2020-03-09 MED ORDER — FUROSEMIDE 20 MG PO TABS: 20.0000 mg | ORAL_TABLET | Freq: Every day | ORAL | 3 refills | Status: DC

## 2020-03-09 MED ORDER — FREESTYLE PRECISION NEO TEST VI STRP: ORAL_STRIP | 0 refills | Status: DC

## 2020-03-09 MED ORDER — TADALAFIL 20 MG PO TABS
10.0000 mg | ORAL_TABLET | ORAL | 11 refills | Status: DC | PRN
Start: 2020-03-09 — End: 2021-11-23

## 2020-03-09 MED ORDER — LOVASTATIN 20 MG PO TABS: 20.0000 mg | ORAL_TABLET | Freq: Every day | ORAL | 3 refills | Status: DC

## 2020-03-09 MED ORDER — LEVOTHYROXINE SODIUM 75 MCG PO TABS: 75.0000 ug | ORAL_TABLET | Freq: Every day | ORAL | 3 refills | Status: DC

## 2020-03-09 MED ORDER — FREESTYLE PRECISION NEO TEST VI STRP: ORAL_STRIP | 4 refills | Status: DC

## 2020-03-09 MED ORDER — LABETALOL HCL 100 MG PO TABS
100.0000 mg | ORAL_TABLET | Freq: Two times a day (BID) | ORAL | 3 refills | Status: DC
Start: 2020-03-09 — End: 2020-10-15

## 2020-03-09 MED ORDER — NIFEDIPINE ER OSMOTIC RELEASE 60 MG PO TB24: 60.0000 mg | ORAL_TABLET | Freq: Every day | ORAL | 3 refills | Status: DC

## 2020-03-09 MED ORDER — ENALAPRIL MALEATE 10 MG PO TABS: 10.0000 mg | ORAL_TABLET | Freq: Every day | ORAL | 3 refills | Status: DC

## 2020-03-09 NOTE — Patient Instructions (Addendum)
Preventive Care 94 Years and Older, Male Preventive care refers to lifestyle choices and visits with your health care provider that can promote health and wellness. This includes:  A yearly physical exam. This is also called an annual well check.  Regular dental and eye exams.  Immunizations.  Screening for certain conditions.  Healthy lifestyle choices, such as diet and exercise. What can I expect for my preventive care visit? Physical exam Your health care provider will check:  Height and weight. These may be used to calculate body mass index (BMI), which is a measurement that tells if you are at a healthy weight.  Heart rate and blood pressur  Your skin for abnormal spots. Counseling Your health care provider may ask you questions about:  Alcohol, tobacco, and drug use.  Emotional well-being.  Home and relationship well-being.  Sexual activity.  Eating habits.  History of falls.  Memory and ability to understand (cognition).  Work and work Statistician. What immunizations do I need?  Influenza (flu) vaccine  This is recommended every year. Tetanus, diphtheria, and pertussis (Tdap) vaccine  You may need a Td booster every 10 years. Varicella (chickenpox) vaccine  You may need this vaccine if you have not already been vaccinated. Zoster (shingles) vaccine  You may need this after age 47. Pneumococcal conjugate (PCV13) vaccine  One dose is recommended after age 33. Pneumococcal polysaccharide (PPSV23) vaccine  One dose is recommended after age 38. Measles, mumps, and rubella (MMR) vaccine  You may need at least one dose of MMR if you were born in 1957 or later. You may also need a second dose. Meningococcal conjugate (MenACWY) vaccine  You may need this if you have certain conditions. Hepatitis A vaccine  You may need this if you have certain conditions or if you travel or work in places where you may be exposed to hepatitis A. Hepatitis B  vaccine  You may need this if you have certain conditions or if you travel or work in places where you may be exposed to hepatitis B. Haemophilus influenzae type b (Hib) vaccine  You may need this if you have certain conditions. You may receive vaccines as individual doses or as more than one vaccine together in one shot (combination vaccines). Talk with your health care provider about the risks and benefits of combination vaccines. What tests do I need? Blood tests  Lipid and cholesterol levels. These may be checked every 5 years, or more frequently depending on your overall health.  Hepatitis C test.  Hepatitis B test. Screening  Lung cancer screening. You may have this screening every year starting at age 32 if you have a 30-pack-year history of smoking and currently smoke or have quit within the past 15 years.  Colorectal cancer screening. All adults should have this screening starting at age 74 and continuing until age 75. Your health care provider may recommend screening at age 76 if you are at increased risk. You will have tests every 1-10 years, depending on your results and the type of screening test.  Prostate cancer screening. Recommendations will vary depending on your family history and other risks.  Diabetes screening. This is done by checking your blood sugar (glucose) after you have not eaten for a while (fasting). You may have this done every 1-3 years.  Abdominal aortic aneurysm (AAA) screening. You may need this if you are a current or former smoker.  Sexually transmitted disease (STD) testing. Follow these instructions at home: Eating and drinking  Eat  a diet that includes fresh fruits and vegetables, whole grains, lean protein, and low-fat dairy products. Limit your intake of foods with high amounts of sugar, saturated fats, and salt.  Take vitamin and mineral supplements as recommended by your health care provider.  Do not drink alcohol if your health care  provider tells you not to drink.  If you drink alcohol: ? Limit how much you have to 0-2 drinks a day. ? Be aware of how much alcohol is in your drink. In the [place] one drink equals one 12 oz bottle of beer (355 mL), one 5 oz glass of wine (148 mL), or one 1 oz glass of hard liquor (44 mL). Lifestyle  Take daily care of your teeth and gums.  Stay active. Exercise for at least 30 minutes on 5 or more days each week.  Do not use any products that contain nicotine or tobacco, such as cigarettes, e-cigarettes, and chewing tobacco. If you need help quitting, ask your health care provider.  If you are sexually active, practice safe sex. Use a condom or other form of protection to prevent STIs (sexually transmitted infections).  Talk with your health care provider about taking a low-dose aspirin or statin. What's next?  Visit your health care provider once a year for a well check visit.  Ask your health care provider how often you should have your eyes and teeth checked.  Stay up to date on all vaccines. This information is not intended to replace advice given to you by your health care provider. Make sure you discuss any questions you have with your health care provider. Document Revised: 03/07/2018 Document Reviewed: 03/07/2018 Elsevier Patient Education  2020 [employer].

## 2020-03-09 NOTE — Assessment & Plan Note (Signed)
Per hematology 

## 2020-03-09 NOTE — Assessment & Plan Note (Signed)
Check labs 

## 2020-03-09 NOTE — Assessment & Plan Note (Signed)
Encouraged heart healthy diet, increase exercise, avoid trans fats, consider a krill oil cap daily 

## 2020-03-09 NOTE — Assessment & Plan Note (Signed)
Well controlled, no changes to meds. Encouraged heart healthy diet such as the DASH diet and exercise as tolerated.  °

## 2020-03-09 NOTE — Assessment & Plan Note (Signed)
Check labs con't hypothyroid

## 2020-03-09 NOTE — Progress Notes (Signed)
Patient ID: Trevor Cooke, male    DOB: January 10, 1941  Age: 79 y.o. MRN: [id number]    Subjective:  Subjective  HPI Trevor Cooke presents for f/u bp , chol and dm  HYPERTENSION   Blood pressure range-not checking   Chest pain- no      Dyspnea- no Lightheadedness- no   Edema- no  Other side effects - no   Medication compliance: good Low salt diet- yes    DIABETES    Blood Sugar ranges-good per pt   Polyuria- no New Visual problems- no Hypoglycemic symptoms- no  Other side effects-no Medication compliance - good Last eye exam- due   HYPERLIPIDEMIA  Medication compliance- good RUQ pain- mo  Muscle aches- mo Other side effects-mo      Review of Systems  Constitutional: Negative for appetite change, diaphoresis, fatigue and unexpected weight change.  Eyes: Negative for pain, redness and visual disturbance.  Respiratory: Negative for cough, chest tightness, shortness of breath and wheezing.   Cardiovascular: Negative for chest pain, palpitations and leg swelling.  Endocrine: Negative for cold intolerance, heat intolerance, polydipsia, polyphagia and polyuria.  Genitourinary: Negative for difficulty urinating, dysuria and frequency.  Neurological: Negative for dizziness, light-headedness, numbness and headaches.    History Past Medical History:  Diagnosis Date  . Arthritis    in neck  . CVA (cerebral infarction) 1996  . GERD (gastroesophageal reflux disease)   . History of prediabetes   . Hyperlipidemia   . Hypertension   . MDS (myelodysplastic syndrome), low grade ([place]) 04/26/2016  . Neutropenia ([place])   . Psoriasis   . Thyroid disease     He has a past surgical history that includes Cleft palate repair (1940's).   His family history includes Cancer in his maternal uncle; Diabetes in his maternal uncle and sister; Hypertension in his father and mother; Stroke in his father.He reports that he has quit smoking. He has never used smokeless tobacco. He  reports current alcohol use. He reports that he does not use drugs.  Current Outpatient Medications on File Prior to Visit  Medication Sig Dispense Refill  . ascorbic acid (VITAMIN C) 1000 MG tablet Take 1,000 mg by mouth daily.    Marland Kitchen aspirin 81 MG tablet Take 81 mg by mouth daily.    Marland Kitchen Bioflavonoid Products (ESTER-C) 500-200-60 MG TABS Take 500 mg by mouth daily.    . calcipotriene-betamethasone (TACLONEX SCALP) external suspension Apply topically daily.    Marland Kitchen CALCITRIOL EX Apply topically.    . Calcium Carbonate (CALCIUM 600 PO) Take 600 mg by mouth daily.    . Cholecalciferol (VITAMIN D) 2000 units CAPS Take by mouth.    . clobetasol cream (TEMOVATE) 2.11 % Apply 1 application topically as needed.     . Emollient (EUCERIN) lotion Apply topically as needed for dry skin.    . famotidine (PEPCID) 20 MG tablet Take 20 mg by mouth 2 (two) times daily.    . Multiple Vitamins-Minerals (MULTIVITAMIN WITH MINERALS) tablet Take by mouth daily.    . mupirocin ointment (BACTROBAN) 2 % Place 1 application into the nose 2 (two) times daily.    . Selenium 200 MCG TABS Take 200 mcg by mouth daily.    Marland Kitchen testosterone cypionate (DEPOTESTOSTERONE CYPIONATE) 200 MG/ML injection Inject 75 mg into the muscle once a week.    . Zinc 50 MG CAPS Take by mouth.     No current facility-administered medications on file prior to visit.     Objective:  Objective  Physical Exam Vitals and nursing note reviewed.  Constitutional:      General: He is sleeping. Vital signs are normal.     Appearance: He is well-developed and well-nourished.  HENT:     Head: Normocephalic and atraumatic.     Mouth/Throat:     Mouth: Oropharynx is clear and moist.  Eyes:     Extraocular Movements: EOM normal.     Pupils: Pupils are equal, round, and reactive to light.  Neck:     Thyroid: No thyromegaly.  Cardiovascular:     Rate and Rhythm: Normal rate and regular rhythm.     Heart sounds: No murmur heard.   Pulmonary:      Effort: Pulmonary effort is normal. No respiratory distress.     Breath sounds: Normal breath sounds. No wheezing or rales.  Chest:     Chest wall: No tenderness.  Musculoskeletal:        General: No tenderness or edema.     Cervical back: Normal range of motion and neck supple.  Skin:    General: Skin is warm and dry.  Neurological:     Mental Status: He is oriented to person, place, and time.  Psychiatric:        Mood and Affect: Mood and affect normal.        Behavior: Behavior normal.        Thought Content: Thought content normal.        Judgment: Judgment normal.    BP 140/82 (BP Location: Left Arm, Patient Position: Sitting, Cuff Size: Normal)   Pulse 72   Temp 97.7 F (36.5 C) (Oral)   Resp 18   Ht 5\' 8"  (1.727 m)   Wt 191 lb 3.2 oz (86.7 kg)   SpO2 97%   BMI 29.07 kg/m  Wt Readings from Last 3 Encounters:  03/09/20 191 lb 3.2 oz (86.7 kg)  02/23/20 193 lb 4 oz (87.7 kg)  12/08/19 188 lb 6.4 oz (85.5 kg)     Lab Results  Component Value Date   WBC 2.4 (L) 03/09/2020   HGB 13.9 03/09/2020   HCT 40.9 03/09/2020   PLT 163.0 03/09/2020   GLUCOSE 106 (H) 03/09/2020   CHOL 109 03/09/2020   TRIG 69.0 03/09/2020   HDL 32.00 (L) 03/09/2020   LDLCALC 63 03/09/2020   ALT 15 03/09/2020   AST 21 03/09/2020   NA 139 03/09/2020   K 4.3 03/09/2020   CL 100 03/09/2020   CREATININE 1.08 03/09/2020   BUN 9 03/09/2020   CO2 32 03/09/2020   TSH 4.38 03/09/2020   PSA 0.40 03/09/2020   HGBA1C 5.9 03/09/2020   MICROALBUR 0.8 03/17/2019    No results found.   Assessment & Plan:  Plan  I am having Trevor Cooke start on tadalafil. I am also having him maintain his aspirin, multivitamin with minerals, Zinc, Vitamin D, clobetasol cream, selenium, Ester-C, Calcium Carbonate (CALCIUM 600 PO), famotidine, calcipotriene-betamethasone, mupirocin ointment, CALCITRIOL EX, eucerin, ascorbic acid, testosterone cypionate, enalapril, furosemide, labetalol, levothyroxine,  lovastatin, NIFEdipine, and FreeStyle Precision Neo Test.  Meds ordered this encounter  Medications  . enalapril (VASOTEC) 10 MG tablet    Sig: Take 1 tablet (10 mg total) by mouth daily.    Dispense:  90 tablet    Refill:  3  . furosemide (LASIX) 20 MG tablet    Sig: Take 1 tablet (20 mg total) by mouth daily.    Dispense:  90 tablet    Refill:  3  . labetalol (NORMODYNE) 100 MG tablet    Sig: Take 1 tablet (100 mg total) by mouth 2 (two) times daily.    Dispense:  180 tablet    Refill:  3  . levothyroxine (SYNTHROID) 75 MCG tablet    Sig: Take 1 tablet (75 mcg total) by mouth daily.    Dispense:  90 tablet    Refill:  3  . lovastatin (MEVACOR) 20 MG tablet    Sig: Take 1 tablet (20 mg total) by mouth at bedtime.    Dispense:  90 tablet    Refill:  3  . NIFEdipine (PROCARDIA XL/NIFEDICAL XL) 60 MG 24 hr tablet    Sig: Take 1 tablet (60 mg total) by mouth daily.    Dispense:  90 tablet    Refill:  3  . tadalafil (CIALIS) 20 MG tablet    Sig: Take 0.5-1 tablets (10-20 mg total) by mouth every other day as needed for erectile dysfunction.    Dispense:  5 tablet    Refill:  11  . DISCONTD: glucose blood (FREESTYLE PRECISION NEO TEST) test strip    Sig: Use as instructed    Dispense:  50 each    Refill:  0  . glucose blood (FREESTYLE PRECISION NEO TEST) test strip    Sig: Use as instructed    Dispense:  100 each    Refill:  4    Problem List Items Addressed This Visit      Unprioritized   Controlled type 2 diabetes mellitus without complication (HCC)    Check labs       Relevant Medications   enalapril (VASOTEC) 10 MG tablet   lovastatin (MEVACOR) 20 MG tablet   Essential hypertension    Well controlled, no changes to meds. Encouraged heart healthy diet such as the DASH diet and exercise as tolerated.       Relevant Medications   enalapril (VASOTEC) 10 MG tablet   furosemide (LASIX) 20 MG tablet   labetalol (NORMODYNE) 100 MG tablet   lovastatin (MEVACOR) 20 MG  tablet   NIFEdipine (PROCARDIA XL/NIFEDICAL XL) 60 MG 24 hr tablet   tadalafil (CIALIS) 20 MG tablet   Other Relevant Orders   CBC with Differential/Platelet (Completed)   Hyperlipidemia    Encouraged heart healthy diet, increase exercise, avoid trans fats, consider a krill oil cap daily      Relevant Medications   enalapril (VASOTEC) 10 MG tablet   furosemide (LASIX) 20 MG tablet   labetalol (NORMODYNE) 100 MG tablet   lovastatin (MEVACOR) 20 MG tablet   NIFEdipine (PROCARDIA XL/NIFEDICAL XL) 60 MG 24 hr tablet   tadalafil (CIALIS) 20 MG tablet   Other Relevant Orders   Lipid panel (Completed)   CBC with Differential/Platelet (Completed)   Comprehensive metabolic panel (Completed)   Hypothyroidism    Check labs con't hypothyroid      Relevant Medications   labetalol (NORMODYNE) 100 MG tablet   levothyroxine (SYNTHROID) 75 MCG tablet   Other Relevant Orders   TSH (Completed)   CBC with Differential/Platelet (Completed)   MDS (myelodysplastic syndrome), low grade (HCC) (Chronic)    Per hematology       Other Visit Diagnoses    Preventative health care    -  Primary   Relevant Orders   PSA (Completed)   Erectile dysfunction, unspecified erectile dysfunction type       Relevant Medications   tadalafil (CIALIS) 20 MG tablet   Other Relevant Orders  PSA (Completed)   CBC with Differential/Platelet (Completed)   Uncontrolled type 2 diabetes mellitus with hyperglycemia (HCC)       Relevant Medications   enalapril (VASOTEC) 10 MG tablet   lovastatin (MEVACOR) 20 MG tablet   Other Relevant Orders   CBC with Differential/Platelet (Completed)   Comprehensive metabolic panel (Completed)   Hemoglobin A1c (Completed)   Low testosterone       Relevant Orders   PSA (Completed)   Nocturia       Relevant Orders   PSA (Completed)      Follow-up: Return in about 6 months (around 09/07/2020) for hypertension, hyperlipidemia, diabetes II.  [name], DO

## 2020-03-10 ENCOUNTER — Telehealth: Payer: Self-pay | Admitting: Family Medicine

## 2020-03-10 NOTE — Telephone Encounter (Signed)
Pharmacy is requesting a prescription for glucose test strips be sent to pharmacy with direction, frequency in which patient should test and dx .   glucose blood (FREESTYLE PRECISION NEO TEST) test strip [[id number]]    [place], [place].  [address] [place] [address]  Phone:  [phone] Fax:  [phone]  DEA #:  --  DAW Reason: --

## 2020-03-10 NOTE — Telephone Encounter (Signed)
Check glucose 1x daily

## 2020-03-10 NOTE — Telephone Encounter (Signed)
No ---  is ldl is where we want it

## 2020-03-10 NOTE — Telephone Encounter (Signed)
Please advise 

## 2020-03-10 NOTE — Progress Notes (Signed)
Pt has a lot of trouble with gerd and swallowing so we discussed fosamax but I did not think it was a wise choice for him We discussed calcium supp and inc calcium in diet as well as vita d  We also discussed weight bearing exercise  We can recheck bmd in 2 years ---  thanks

## 2020-03-10 NOTE — Progress Notes (Signed)
Sounds like a good plan!   Thanks,  De Blanch, PharmD, BCACP Clinical Pharmacist [facility] [phone]

## 2020-03-11 MED ORDER — FREESTYLE PRECISION NEO TEST VI STRP: ORAL_STRIP | 4 refills | Status: DC

## 2020-03-11 NOTE — Telephone Encounter (Signed)
Directions and dx code updated and script resent

## 2020-05-13 ENCOUNTER — Telehealth: Payer: Self-pay | Admitting: Pharmacist

## 2020-05-13 NOTE — Progress Notes (Addendum)
Chronic Care Management Pharmacy Assistant   Name: Trevor Cooke  MRN: [id number] DOB: [birth year]  Reason for Encounter: Disease State For HTN.  Patient Questions:  1.  Have you seen any other providers since your last visit? Yes.   2.  Any changes in your medicines or health? Yes.  PCP : [name], DO  Their chronic conditions include: Hypertension, Hyperlipidemia/Hx of Stroke, Diabetes, Hypothyroidism, Osteopenia, Hypogonadism, GERD, Psoriasis  Office Visits: 03/09/20 [name], DO. Preventative health care. STARTED Tadalafil 10-20 mg every 48 hours PRN.  Consults: None since 03/08/20  Allergies:  No Known Allergies  Medications: Outpatient Encounter Medications as of 05/13/2020  Medication Sig Note   ascorbic acid (VITAMIN C) 1000 MG tablet Take 1,000 mg by mouth daily.    aspirin 81 MG tablet Take 81 mg by mouth daily.    Bioflavonoid Products (ESTER-C) 500-200-60 MG TABS Take 500 mg by mouth daily.    calcipotriene-betamethasone (TACLONEX SCALP) external suspension Apply topically daily.    CALCITRIOL EX Apply topically.    Calcium Carbonate (CALCIUM 600 PO) Take 600 mg by mouth daily.    Cholecalciferol (VITAMIN D) 2000 units CAPS Take by mouth.    clobetasol cream (TEMOVATE) 6.43 % Apply 1 application topically as needed.     Emollient (EUCERIN) lotion Apply topically as needed for dry skin.    enalapril (VASOTEC) 10 MG tablet Take 1 tablet (10 mg total) by mouth daily.    famotidine (PEPCID) 20 MG tablet Take 20 mg by mouth 2 (two) times daily.    furosemide (LASIX) 20 MG tablet Take 1 tablet (20 mg total) by mouth daily.    glucose blood (FREESTYLE PRECISION NEO TEST) test strip Check glucose 1 time daily dx:E11.9    labetalol (NORMODYNE) 100 MG tablet Take 1 tablet (100 mg total) by mouth 2 (two) times daily.    levothyroxine (SYNTHROID) 75 MCG tablet Take 1 tablet (75 mcg total) by mouth daily.    lovastatin (MEVACOR) 20 MG tablet  Take 1 tablet (20 mg total) by mouth at bedtime.    Multiple Vitamins-Minerals (MULTIVITAMIN WITH MINERALS) tablet Take by mouth daily. 05/03/2015: Received from: [facility] Received Sig: Take by mouth.   mupirocin ointment (BACTROBAN) 2 % Place 1 application into the nose 2 (two) times daily.    NIFEdipine (PROCARDIA XL/NIFEDICAL XL) 60 MG 24 hr tablet Take 1 tablet (60 mg total) by mouth daily.    Selenium 200 MCG TABS Take 200 mcg by mouth daily.    tadalafil (CIALIS) 20 MG tablet Take 0.5-1 tablets (10-20 mg total) by mouth every other day as needed for erectile dysfunction.    testosterone cypionate (DEPOTESTOSTERONE CYPIONATE) 200 MG/ML injection Inject 75 mg into the muscle once a week.    Zinc 50 MG CAPS Take by mouth.    No facility-administered encounter medications on file as of 05/13/2020.    Current Diagnosis: Patient Active Problem List   Diagnosis Date Noted   MDS (myelodysplastic syndrome), low grade ([place]) 04/26/2016   Arthritis 08/01/2011   Routine health maintenance 08/01/2011   PSORIASIS 05/31/2010   CHEST WALL PAIN, ACUTE 04/12/2010   Hypogonadism male 02/12/2007   Hypothyroidism 10/06/2006   Diabetes mellitus, type 2 ([place]) 10/06/2006   Hyperlipidemia 10/06/2006   Essential hypertension 10/06/2006   GERD 10/06/2006   Controlled type 2 diabetes mellitus without complication ([place]) 32/95/1884    Goals Addressed   None    Reviewed chart  prior to disease state call. Spoke with patient regarding BP  Recent Office Vitals: BP Readings from Last 3 Encounters:  03/09/20 140/82  02/23/20 135/66  12/08/19 124/63   Pulse Readings from Last 3 Encounters:  03/09/20 72  02/23/20 (!) 55  12/08/19 62    Wt Readings from Last 3 Encounters:  03/09/20 191 lb 3.2 oz (86.7 kg)  02/23/20 193 lb 4 oz (87.7 kg)  12/08/19 188 lb 6.4 oz (85.5 kg)     Kidney Function Lab Results  Component Value Date/Time   CREATININE 1.08 03/09/2020 10:07 AM    CREATININE 1.24 02/23/2020 08:45 AM   CREATININE 1.10 10/15/2019 09:19 AM   CREATININE 1.2 04/26/2016 11:17 AM   CREATININE 1.09 12/28/2015 08:55 AM   GFR 64.59 03/09/2020 10:07 AM   GFRNONAA 59 (L) 02/23/2020 08:45 AM   GFRNONAA 66 12/28/2015 08:55 AM   GFRAA >60 10/15/2019 09:19 AM   GFRAA 76 12/28/2015 08:55 AM    BMP Latest Ref Rng & Units 03/09/2020 02/23/2020 10/15/2019  Glucose 70 - 99 mg/dL 106(H) 178(H) 163(H)  BUN 6 - 23 mg/dL 9 12 17   Creatinine 0.40 - 1.50 mg/dL 1.08 1.24 1.10  Sodium 135 - 145 mEq/L 139 139 138  Potassium 3.5 - 5.1 mEq/L 4.3 4.1 4.1  Chloride 96 - 112 mEq/L 100 100 100  CO2 19 - 32 mEq/L 32 31 33(H)  Calcium 8.4 - 10.5 mg/dL 8.8 9.5 9.6    Current antihypertensive regimen:  Enalapril 10 mg daily Labetaolol 100 mg twice daily Furosemide 20 mg daily Nifedipine XL 60 mg daily  How often are you checking your Blood Pressure? 1-2x per week   Current home BP readings: AM before medication: 05/13/20 119/70  05/10/20 148/60 05/06/20 123/68 04/18/20 136/73 04/16/20 142/72 04/15/20 128/78 04/14/20 161/80 144/83  What recent interventions/DTPs have been made by any provider to improve Blood Pressure control since last CPP Visit: None  Any recent hospitalizations or ED visits since last visit with CPP? Patient stated no.  What diet changes have been made to improve Blood Pressure Control?  Patient stated he does not eat with salt. He stated he doesn't eat out a lot .  What exercise is being done to improve your Blood Pressure Control?  Patient stated he is pretty busy he does some walking as well as yard work if weather permits.  Adherence Review: Is the patient currently on ACE/ARB medication? Yes, Enalapril 10 mg.  Does the patient have >5 day gap between last estimated fill dates? No  Patient stated he does not have any concerns about his medication at this time.    Follow-Up:  Pharmacist Review   Charlann Lange, RMA Clinical Pharmacist  Assistant [phone]  10 minutes spent in review, coordination, and documentation.  Reviewed by: Beverly Milch, PharmD Clinical Pharmacist [facility] [phone]

## 2020-06-03 NOTE — Progress Notes (Unsigned)
Chronic Care Management Pharmacy Note  06/08/2020 Name:  Trevor Cooke MRN:  [id number] DOB:  09/21/1940  Subjective: Trevor Cooke is an 80 y.o. year old male who is a primary patient of [name], DO.  The CCM team was consulted for assistance with disease management and care coordination needs.    Engaged with patient by telephone for follow up visit in response to provider referral for pharmacy case management and/or care coordination services.   Consent to Services:  The patient was given the following information about Chronic Care Management services today, agreed to services, and gave verbal consent: 1. CCM service includes personalized support from designated clinical staff supervised by the primary care provider, including individualized plan of care and coordination with other care providers 2. 24/7 contact phone numbers for assistance for urgent and routine care needs. 3. Service will only be billed when office clinical staff spend 20 minutes or more in a month to coordinate care. 4. Only one practitioner may furnish and bill the service in a calendar month. 5.The patient may stop CCM services at any time (effective at the end of the month) by phone call to the office staff. 6. The patient will be responsible for cost sharing (co-pay) of up to 20% of the service fee (after annual deductible is met). Patient agreed to services and consent obtained.  Patient Care Team: Carollee Herter, Alferd Apa, DO as PCP - General (Family Medicine) Eduard Roux, MD (Gastroenterology) Renita Papa, MD as Referring Physician (Dermatology) Regal, Tamala Fothergill, DPM as Consulting Physician (Podiatry) Marin Olp, Rudell Cobb, MD as Consulting Physician (Oncology) Robbie Louis, MD as Referring Physician (Urology) Edythe Clarity, [facility] (Pharmacist)  Recent office visits: 03/09/20 [name]) - regular follow up, no medication changes made  Recent consult visits: 02/23/20: Heme/Onc  visit w/ Dr. Marin Olp - Testosterone has improved pt's quality of life and blood counts. No med changes noted. RTC 6 months.   02/09/20: Ophthalmology visit w/ Dr. Malcolm Metro - new Rx for CLs and specs. RTC 1 year.  01/05/20: Urology visit w/ Dr. Jenetta Loges - Hypogonadism prescribed. Testosterole 30m/0.5mL weekly  Hospital visits: None in previous 6 months  Objective:  Lab Results  Component Value Date   CREATININE 1.08 03/09/2020   BUN 9 03/09/2020   GFR 64.59 03/09/2020   GFRNONAA 59 (L) 02/23/2020   GFRAA >60 10/15/2019   NA 139 03/09/2020   K 4.3 03/09/2020   CALCIUM 8.8 03/09/2020   CO2 32 03/09/2020    Lab Results  Component Value Date/Time   HGBA1C 5.9 03/09/2020 10:07 AM   HGBA1C 6.1 03/17/2019 09:37 AM   GFR 64.59 03/09/2020 10:07 AM   GFR 65.00 09/15/2019 09:32 AM   MICROALBUR 0.8 03/17/2019 09:37 AM   MICROALBUR <0.7 08/27/2014 08:14 AM    Last diabetic Eye exam:  Lab Results  Component Value Date/Time   HMDIABEYEEXA No Retinopathy 12/26/2014 12:00 AM    Last diabetic Foot exam: No results found for: HMDIABFOOTEX   Lab Results  Component Value Date   CHOL 109 03/09/2020   HDL 32.00 (L) 03/09/2020   LDLCALC 63 03/09/2020   TRIG 69.0 03/09/2020   CHOLHDL 3 03/09/2020    Hepatic Function Latest Ref Rng & Units 03/09/2020 02/23/2020 10/15/2019  Total Protein 6.0 - 8.3 g/dL 7.1 7.4 7.4  Albumin 3.5 - 5.2 g/dL 4.3 4.3 4.5  AST 0 - 37 U/L 21 24 21   ALT 0 - 53 U/L 15 13 14   Alk Phosphatase 39 -  117 U/L 78 78 74  Total Bilirubin 0.2 - 1.2 mg/dL 0.6 0.5 0.6  Bilirubin, Direct 0.0 - 0.3 mg/dL - - -    Lab Results  Component Value Date/Time   TSH 4.38 03/09/2020 10:07 AM   TSH 2.16 09/15/2019 09:32 AM   FREET4 0.71 12/28/2015 08:55 AM   FREET4 0.77 09/18/2012 11:07 AM    CBC Latest Ref Rng & Units 03/09/2020 02/23/2020 10/15/2019  WBC 4.0 - 10.5 K/uL 2.4(L) 2.5(L) 2.3(L)  Hemoglobin 13.0 - 17.0 g/dL 13.9 13.5 11.4(L)  Hematocrit 39.0 - 52.0 % 40.9 39.8  33.2(L)  Platelets 150.0 - 400.0 K/uL 163.0 192 156    No results found for: VD25OH  Clinical ASCVD: Yes  The ASCVD Risk score Mikey Bussing DC Jr., et al., 2013) failed to calculate for the following reasons:   The 2013 ASCVD risk score is only valid for ages 49 to 55    Depression screen PHQ 2/9 09/15/2019 05/10/2017 03/08/2017  Decreased Interest 0 0 0  Down, Depressed, Hopeless 0 0 0  PHQ - 2 Score 0 0 0    Social History   Tobacco Use  Smoking Status Former Smoker  Smokeless Tobacco Never Used  Tobacco Comment   quit 1970   BP Readings from Last 3 Encounters:  03/09/20 140/82  02/23/20 135/66  12/08/19 124/63   Pulse Readings from Last 3 Encounters:  03/09/20 72  02/23/20 (!) 55  12/08/19 62   Wt Readings from Last 3 Encounters:  03/09/20 191 lb 3.2 oz (86.7 kg)  02/23/20 193 lb 4 oz (87.7 kg)  12/08/19 188 lb 6.4 oz (85.5 kg)    Assessment/Interventions: Review of patient past medical history, allergies, medications, health status, including review of consultants reports, laboratory and other test data, was performed as part of comprehensive evaluation and provision of chronic care management services.   SDOH:  (Social Determinants of Health) assessments and interventions performed: No   CCM Care Plan  No Known Allergies  Medications Reviewed Today    Reviewed by Edythe Clarity, [facility] (Pharmacist) on 06/07/20 at [place] List Status: <None>  Medication Order Taking? Sig Documenting Provider Last Dose Status Informant  ascorbic acid (VITAMIN C) 1000 MG tablet [id number] Yes Take 1,000 mg by mouth daily. [provider] Taking Active   aspirin 81 MG tablet [id number] Yes Take 81 mg by mouth daily. [provider] Taking Active   Bioflavonoid Products (ESTER-C) 500-200-60 MG TABS [id number] Yes Take 500 mg by mouth daily. [provider] Taking Active   calcipotriene-betamethasone [facility] SCALP) external suspension [id number] Yes Apply  topically daily. [provider] Taking Active Self  CALCITRIOL EX [id number] Yes Apply topically. [provider] Taking Active Self  Calcium Carbonate (CALCIUM 600 PO) [id number] Yes Take 600 mg by mouth daily. [provider] Taking Active   Cholecalciferol (VITAMIN D) 2000 units CAPS [id number] Yes Take by mouth. [provider] Taking Active   clobetasol cream (TEMOVATE) 0.05 % [id number] Yes Apply 1 application topically as needed.  [provider] Taking Active   Emollient (EUCERIN) lotion [id number] Yes Apply topically as needed for dry skin. [provider] Taking Active Self  enalapril (VASOTEC) 10 MG tablet [id number] Yes Take 1 tablet (10 mg total) by mouth daily. Roma Schanz R, DO Taking Active   famotidine (PEPCID) 20 MG tablet [id number] Yes Take 20 mg by mouth 2 (two) times daily. [provider] Taking Active Self  furosemide (LASIX) 20 MG tablet [id number]  Yes Take 1 tablet (20 mg total) by mouth daily. Roma Schanz R, DO Taking Active   glucose blood (FREESTYLE PRECISION NEO TEST) test strip [id number] Yes Check glucose 1 time daily dx:E11.9 Carollee Herter, Alferd Apa, DO Taking Active   labetalol (NORMODYNE) 100 MG tablet [id number] Yes Take 1 tablet (100 mg total) by mouth 2 (two) times daily. [name], DO Taking Active   levothyroxine (SYNTHROID) 75 MCG tablet [id number] Yes Take 1 tablet (75 mcg total) by mouth daily. [name], DO Taking Active   lovastatin (MEVACOR) 20 MG tablet [id number] Yes Take 1 tablet (20 mg total) by mouth at bedtime. Roma Schanz R, DO Taking Active   Multiple Vitamins-Minerals (MULTIVITAMIN WITH MINERALS) tablet [id number] Yes Take by mouth daily. [provider] Taking Active            Med Note Evonnie Pat May 03, 2015 10:04 AM) Received from: [facility] Received Sig: Take by mouth.  mupirocin ointment (BACTROBAN) 2  % [id number] Yes Place 1 application into the nose 2 (two) times daily. [provider] Taking Active Self  NIFEdipine (PROCARDIA XL/NIFEDICAL XL) 60 MG 24 hr tablet [id number] Yes Take 1 tablet (60 mg total) by mouth daily. [name], DO Taking Active   Selenium 200 MCG TABS [id number] Yes Take 200 mcg by mouth daily. [provider] Taking Active   tadalafil (CIALIS) 20 MG tablet [id number] Yes Take 0.5-1 tablets (10-20 mg total) by mouth every other day as needed for erectile dysfunction. [name], DO Taking Active   testosterone cypionate (DEPOTESTOSTERONE CYPIONATE) 200 MG/ML injection [id number] Yes Inject 75 mg into the muscle once a week. [provider] Taking Active Self  Zinc 50 MG CAPS [id number] Yes Take by mouth. [provider] Taking Active           Patient Active Problem List   Diagnosis Date Noted  . MDS (myelodysplastic syndrome), low grade ([place]) 04/26/2016  . Arthritis 08/01/2011  . Routine health maintenance 08/01/2011  . PSORIASIS 05/31/2010  . CHEST WALL PAIN, ACUTE 04/12/2010  . Hypogonadism male 02/12/2007  . Hypothyroidism 10/06/2006  . Diabetes mellitus, type 2 ([place]) 10/06/2006  . Hyperlipidemia 10/06/2006  . Essential hypertension 10/06/2006  . GERD 10/06/2006  . Controlled type 2 diabetes mellitus without complication ([place]) 67/54/4920    Immunization History  Administered Date(s) Administered  . Fluad Quad(high Dose 65+) 01/02/2019, 01/08/2020  . H1N1 04/21/2008  . Influenza Split 01/30/2012  . Influenza Whole 12/28/2003, 01/02/2008, 12/29/2009  . Influenza, High Dose Seasonal PF 01/27/2013, 01/30/2014, 01/28/2015, 01/27/2016, 01/25/2017, 01/23/2018  . PFIZER(Purple Top)SARS-COV-2 Vaccination 04/19/2019, 05/10/2019, 01/29/2020  . Pneumococcal Conjugate-13 04/07/2013  . Pneumococcal Polysaccharide-23 04/12/2010, 09/15/2019  . Td 09/25/1995  . Tdap 10/21/2010  . Zoster 01/30/2012  . Zoster  Recombinat (Shingrix) 11/12/2017, 02/07/2018    Conditions to be addressed/monitored:  Hypertension, Hyperlipidemia/Hx of Stroke, Diabetes, Hypothyroidism, Osteopenia, Hypogonadism, GERD, Psoriasis  Care Plan : General Pharmacy (Adult)  Updates made by Edythe Clarity, RPH since 06/08/2020 12:00 AM    Problem: HTN, HLD, DM, Hypothyroidism   Priority: High  Onset Date: 06/08/2020    Long-Range Goal: Patient-Specific Goal   Start Date: 06/07/2020  Expected End Date: 12/09/2020  This Visit's Progress: On track  Priority: High  Note:   Current Barriers:  . None specifically at this visit .   Pharmacist Clinical Goal(s):  Marland Kitchen Over the next 90  days, patient will achieve adherence to monitoring guidelines and medication adherence to achieve therapeutic efficacy . maintain control of blood pressure and glucose as evidenced by labwork  . adhere to prescribed medication regimen as evidenced by filld ates through collaboration with PharmD and provider.   Interventions: . 1:1 collaboration with Carollee Herter, Alferd Apa, DO regarding development and update of comprehensive plan of care as evidenced by provider attestation and co-signature . Inter-disciplinary care team collaboration (see longitudinal plan of care) . Comprehensive medication review performed; medication list updated in electronic medical record  Hypertension (BP goal <140/90) -Controlled -Current treatment:  Enalapril 22m daily  Labetaolol 1029mtwice daily  Furosemide 2040maily  Nifedipine XL 47m48mily -Medications previously tried: none noted  -Current home readings: 134/78, 124/69, 173/92, 150/90, 132/77, 127/70 -Denies hypotensive/hypertensive symptoms -Educated on BP goals and benefits of medications for prevention of heart attack, stroke and kidney damage; Exercise goal of 150 minutes per week; Importance of home blood pressure monitoring; -Counseled to monitor BP at home daily, document, and provide log at  future appointments -Unknown what caused high outlier, this was before he took BP meds.   -Majority of readings are WNL. -Recommended to continue current medication  Hyperlipidemia: (LDL goal < 70) -Controlled -Current treatment:  Lovastatin 20mg62mly -Medications previously tried: none noted Diet Wife does a lot of cooking, they don't eat out much B - Quiche, polenta, bacon, grits, fruits L - Cabbage, beans; PB sandwich; Banana sandwich D - same as above including sweet potatoes Snacks - Peanuts, candy Drinks - Water, pepsi every other day, coffee (half caffienated/half de-caf), water   Exercise 2-3 days a week doing yard work  Pt and wife expressed a goal to walk 3 days a week for 30 minutes -Educated on Cholesterol goals;  Benefits of statin for ASCVD risk reduction;  -Lipids now at goal!! - Congratulated patient -Recommended to continue current medication  Diabetes (A1c goal <7%) -Controlled -Current medications: . None -Medications previously tried: none noted  -Current home glucose readings . fasting glucose: no readings provided today -Denies hypoglycemic/hyperglycemic symptoms -Current meal patterns: see above -Current exercise: see above -Educated on A1c and blood sugar goals; Exercise goal of 150 minutes per week;  -Fantastic A1c! -Counseled to check feet daily and get yearly eye exams -Recommended continue current management strategies  Osteopenia (Goal Prevent fractures) -Controlled Last DEXA Scan: 12/02/19             T-Score femoral neck: -2.2             T-Score total hip: -1.2             T-Score lumbar spine: -0.7             10-year probability of major osteoporotic fracture: 11%             10-year probability of hip fracture: 5.1%  -Patient is a candidate for pharmacologic treatment due to T-Score -1.0 to -2.5 and 10-year risk of hip fracture > 3% -Current treatment   Calcium 600mg 26my  Vitamin D 2000 units daily -Medications previously  tried: none noted -Recommend [phone] units of vitamin D daily. Recommend 1200 mg of calcium daily from dietary and supplemental sources.  -Dr. Lowne Etter Sjogrenssed therapy and agreed to not start bisphosphonate at this time. -Of not patient mentions he has had swallowing trouble in the past, consider when discussing therapy with bisphosphonate. -Recommended to continue current medication   Patient Goals/Self-Care Activities . Over the next 90  days, patient will:  - take medications as prescribed check blood pressure daily, document, and provide at future appointments target a minimum of 150 minutes of moderate intensity exercise weekly  Follow Up Plan: The care management team will reach out to the patient again over the next 90 days.        Medication Assistance: None required.  Patient affirms current coverage meets needs.  Patient's preferred pharmacy is:  [id number] [place] [place]: [phone]: [address] [address]Phone: [phone]: [phone] [facility] [address]Phone: [phone]: [phone] Uses pill box? Yes Pt endorses 100% compliance  We discussed: Current pharmacy is preferred with insurance plan and patient is satisfied with pharmacy services Patient decided to: Continue current medication management strategy  Care Plan and Follow Up Patient Decision:  Patient agrees to Care Plan and Follow-up.  Plan: The care management team will reach out to the patient again over the next 90 days.  CBeverly Milch PharmD Clinical Pharmacist [place]([phone]

## 2020-06-07 ENCOUNTER — Ambulatory Visit (INDEPENDENT_AMBULATORY_CARE_PROVIDER_SITE_OTHER): Payer: Medicare Other | Admitting: Pharmacist

## 2020-06-07 DIAGNOSIS — E119 Type 2 diabetes mellitus without complications: Secondary | ICD-10-CM

## 2020-06-07 DIAGNOSIS — I10 Essential (primary) hypertension: Secondary | ICD-10-CM | POA: Diagnosis not present

## 2020-06-07 DIAGNOSIS — E785 Hyperlipidemia, unspecified: Secondary | ICD-10-CM | POA: Diagnosis not present

## 2020-06-07 NOTE — Patient Instructions (Addendum)
Visit Information  Goals Addressed            This Visit's Progress   . Track and Manage My Blood Pressure-Hypertension       Timeframe:  Long-Range Goal Priority:  High Start Date:  06/08/20                           Expected End Date: 12/09/20                      Follow Up Date 09/24/19   - check blood pressure 3 times per week - write blood pressure results in a log or diary    Why is this important?    You won't feel high blood pressure, but it can still hurt your blood vessels.   High blood pressure can cause heart or kidney problems. It can also cause a stroke.   Making lifestyle changes like losing a little weight or eating less salt will help.   Checking your blood pressure at home and at different times of the day can help to control blood pressure.   If the doctor prescribes medicine remember to take it the way the doctor ordered.   Call the office if you cannot afford the medicine or if there are questions about it.     Notes:       Patient Care Plan: General Pharmacy (Adult)    Problem Identified: HTN, HLD, DM, Hypothyroidism   Priority: High  Onset Date: 06/08/2020    Long-Range Goal: Patient-Specific Goal   Start Date: 06/07/2020  Expected End Date: 12/09/2020  This Visit's Progress: On track  Priority: High  Note:   Current Barriers:  . None specifically at this visit .   Pharmacist Clinical Goal(s):  Marland Kitchen Over the next 90 days, patient will achieve adherence to monitoring guidelines and medication adherence to achieve therapeutic efficacy . maintain control of blood pressure and glucose as evidenced by labwork  . adhere to prescribed medication regimen as evidenced by filld ates through collaboration with PharmD and provider.   Interventions: . 1:1 collaboration with Carollee Herter, Alferd Apa, DO regarding development and update of comprehensive plan of care as evidenced by provider attestation and co-signature . Inter-disciplinary care team  collaboration (see longitudinal plan of care) . Comprehensive medication review performed; medication list updated in electronic medical record  Hypertension (BP goal <140/90) -Controlled -Current treatment:  Enalapril 10mg  daily  Labetaolol 100mg  twice daily  Furosemide 20mg  daily  Nifedipine XL 60mg  daily -Medications previously tried: none noted  -Current home readings: 134/78, 124/69, 173/92, 150/90, 132/77, 127/70 -Denies hypotensive/hypertensive symptoms -Educated on BP goals and benefits of medications for prevention of heart attack, stroke and kidney damage; Exercise goal of 150 minutes per week; Importance of home blood pressure monitoring; -Counseled to monitor BP at home daily, document, and provide log at future appointments -Unknown what caused high outlier, this was before he took BP meds.   -Majority of readings are WNL. -Recommended to continue current medication  Hyperlipidemia: (LDL goal < 70) -Controlled -Current treatment:  Lovastatin 20mg  daily -Medications previously tried: none noted Diet Wife does a lot of cooking, they don't eat out much B - Quiche, polenta, bacon, grits, fruits L - Cabbage, beans; PB sandwich; Banana sandwich D - same as above including sweet potatoes Snacks - Peanuts, candy Drinks - Water, pepsi every other day, coffee (half caffienated/half de-caf), water   Exercise 2-3 days a  week doing yard work  Pt and wife expressed a goal to walk 3 days a week for 30 minutes -Educated on Cholesterol goals;  Benefits of statin for ASCVD risk reduction;  -Lipids now at goal!! - Congratulated patient -Recommended to continue current medication  Diabetes (A1c goal <7%) -Controlled -Current medications: . None -Medications previously tried: none noted  -Current home glucose readings . fasting glucose: no readings provided today -Denies hypoglycemic/hyperglycemic symptoms -Current meal patterns: see above -Current exercise: see  above -Educated on A1c and blood sugar goals; Exercise goal of 150 minutes per week;  -Fantastic A1c! -Counseled to check feet daily and get yearly eye exams -Recommended continue current management strategies  Osteopenia (Goal Prevent fractures) -Controlled Last DEXA Scan: 12/02/19             T-Score femoral neck: -2.2             T-Score total hip: -1.2             T-Score lumbar spine: -0.7             10-year probability of major osteoporotic fracture: 11%             10-year probability of hip fracture: 5.1%  -Patient is a candidate for pharmacologic treatment due to T-Score -1.0 to -2.5 and 10-year risk of hip fracture > 3% -Current treatment   Calcium 600mg  daily  Vitamin D 2000 units daily -Medications previously tried: none noted -Recommend [phone] units of vitamin D daily. Recommend 1200 mg of calcium daily from dietary and supplemental sources.  -Dr. [name] discussed therapy and agreed to not start bisphosphonate at this time. -Of not patient mentions he has had swallowing trouble in the past, consider when discussing therapy with bisphosphonate. -Recommended to continue current medication   Patient Goals/Self-Care Activities . Over the next 90 days, patient will:  - take medications as prescribed check blood pressure daily, document, and provide at future appointments target a minimum of 150 minutes of moderate intensity exercise weekly  Follow Up Plan: The care management team will reach out to the patient again over the next 90 days.        The patient verbalized understanding of instructions, educational materials, and care plan provided today and agreed to receive a mailed copy of patient instructions, educational materials, and care plan.  Telephone follow up appointment with pharmacy team member scheduled for: 3 months  Edythe Clarity, [facility]  Diabetes Mellitus and Exercise Exercising regularly is important for overall health, especially for people who  have diabetes mellitus. Exercising is not only about losing weight. It has many other health benefits, such as increasing muscle strength and bone density and reducing body fat and stress. This leads to improved fitness, flexibility, and endurance, all of which result in better overall health. What are the benefits of exercise if I have diabetes? Exercise has many benefits for people with diabetes. They include:  Helping to lower and control blood sugar (glucose).  Helping the body to respond better to the hormone insulin by improving insulin sensitivity.  Reducing how much insulin the body needs.  Lowering the risk for heart disease by: ? Lowering "bad" cholesterol and triglyceride levels. ? Increasing "good" cholesterol levels. ? Lowering blood pressure. ? Lowering blood glucose levels. What is my activity plan? Your health care provider or certified diabetes educator can help you make a plan for the type and frequency of exercise that works for you. This is called your activity plan.  Be sure to:  Get at least 150 minutes of medium-intensity or high-intensity exercise each week. Exercises may include brisk walking, biking, or water aerobics.  Do stretching and strengthening exercises, such as yoga or weight lifting, at least 2 times a week.  Spread out your activity over at least 3 days of the week.  Get some form of physical activity each day. ? Do not go more than 2 days in a row without some kind of physical activity. ? Avoid being inactive for more than 90 minutes at a time. Take frequent breaks to walk or stretch.  Choose exercises or activities that you enjoy. Set realistic goals.  Start slowly and gradually increase your exercise intensity over time.   How do I manage my diabetes during exercise? Monitor your blood glucose  Check your blood glucose before and after exercising. If your blood glucose is: ? 240 mg/dL (13.3 mmol/L) or higher before you exercise, check your  urine for ketones. These are chemicals created by the liver. If you have ketones in your urine, do not exercise until your blood glucose returns to normal. ? 100 mg/dL (5.6 mmol/L) or lower, eat a snack containing 15-20 grams of carbohydrate. Check your blood glucose 15 minutes after the snack to make sure that your glucose level is above 100 mg/dL (5.6 mmol/L) before you start your exercise.  Know the symptoms of low blood glucose (hypoglycemia) and how to treat it. Your risk for hypoglycemia increases during and after exercise. Follow these tips and your health care provider's instructions  Keep a carbohydrate snack that is fast-acting for use before, during, and after exercise to help prevent or treat hypoglycemia.  Avoid injecting insulin into areas of the body that are going to be exercised. For example, avoid injecting insulin into: ? Your arms, when you are about to play tennis. ? Your legs, when you are about to go jogging.  Keep records of your exercise habits. Doing this can help you and your health care provider adjust your diabetes management plan as needed. Write down: ? Food that you eat before and after you exercise. ? Blood glucose levels before and after you exercise. ? The type and amount of exercise you have done.  Work with your health care provider when you start a new exercise or activity. He or she may need to: ? Make sure that the activity is safe for you. ? Adjust your insulin, other medicines, and food that you eat.  Drink plenty of water while you exercise. This prevents loss of water (dehydration) and problems caused by a lot of heat in the body (heat stroke).   Where to find more information  American Diabetes Association: www.diabetes.org Summary  Exercising regularly is important for overall health, especially for people who have diabetes mellitus.  Exercising has many health benefits. It increases muscle strength and bone density and reduces body fat and  stress. It also lowers and controls blood glucose.  Your health care provider or certified diabetes educator can help you make an activity plan for the type and frequency of exercise that works for you.  Work with your health care provider to make sure any new activity is safe for you. Also work with your health care provider to adjust your insulin, other medicines, and the food you eat. This information is not intended to replace advice given to you by your health care provider. Make sure you discuss any questions you have with your health care provider. Document Revised: 12/09/2018  Document Reviewed: 12/09/2018 Elsevier Patient Education  [place].

## 2020-06-24 DIAGNOSIS — R1314 Dysphagia, pharyngoesophageal phase: Secondary | ICD-10-CM | POA: Diagnosis not present

## 2020-06-24 DIAGNOSIS — K219 Gastro-esophageal reflux disease without esophagitis: Secondary | ICD-10-CM | POA: Diagnosis not present

## 2020-06-24 DIAGNOSIS — R1310 Dysphagia, unspecified: Secondary | ICD-10-CM | POA: Diagnosis not present

## 2020-06-24 DIAGNOSIS — Z8673 Personal history of transient ischemic attack (TIA), and cerebral infarction without residual deficits: Secondary | ICD-10-CM | POA: Diagnosis not present

## 2020-06-24 DIAGNOSIS — Z8719 Personal history of other diseases of the digestive system: Secondary | ICD-10-CM | POA: Diagnosis not present

## 2020-06-24 DIAGNOSIS — D469 Myelodysplastic syndrome, unspecified: Secondary | ICD-10-CM | POA: Diagnosis not present

## 2020-07-05 DIAGNOSIS — Q898 Other specified congenital malformations: Secondary | ICD-10-CM | POA: Diagnosis not present

## 2020-07-05 DIAGNOSIS — E291 Testicular hypofunction: Secondary | ICD-10-CM | POA: Diagnosis not present

## 2020-07-16 DIAGNOSIS — R1310 Dysphagia, unspecified: Secondary | ICD-10-CM | POA: Diagnosis not present

## 2020-07-16 DIAGNOSIS — R1319 Other dysphagia: Secondary | ICD-10-CM | POA: Diagnosis not present

## 2020-07-16 DIAGNOSIS — Z8719 Personal history of other diseases of the digestive system: Secondary | ICD-10-CM | POA: Diagnosis not present

## 2020-07-16 DIAGNOSIS — Z79899 Other long term (current) drug therapy: Secondary | ICD-10-CM | POA: Diagnosis not present

## 2020-07-16 DIAGNOSIS — K219 Gastro-esophageal reflux disease without esophagitis: Secondary | ICD-10-CM | POA: Diagnosis not present

## 2020-08-12 ENCOUNTER — Ambulatory Visit: Payer: Medicare Other | Attending: Internal Medicine

## 2020-08-12 DIAGNOSIS — Z23 Encounter for immunization: Secondary | ICD-10-CM

## 2020-08-12 NOTE — Progress Notes (Signed)
   Covid-19 Vaccination Clinic  Name:  Trevor Cooke    MRN: [id number] DOB: 18-Jan-1941  08/12/2020  Trevor Cooke was observed post Covid-19 immunization for 15 minutes without incident. He was provided with Vaccine Information Sheet and instruction to access the V-Safe system.   Trevor Cooke was instructed to call 911 with any severe reactions post vaccine: Marland Kitchen Difficulty breathing  . Swelling of face and throat  . A fast heartbeat  . A bad rash all over body  . Dizziness and weakness   Immunizations Administered    Name Date Dose VIS Date Route   PFIZER Comrnaty(Gray TOP) Covid-19 Vaccine 08/12/2020  2:43 PM 0.3 mL 03/04/2020 Intramuscular   Manufacturer: [place]   Lot: [id number]   [place]: [phone]

## 2020-08-13 ENCOUNTER — Other Ambulatory Visit (HOSPITAL_BASED_OUTPATIENT_CLINIC_OR_DEPARTMENT_OTHER): Payer: Self-pay

## 2020-08-13 DIAGNOSIS — Z23 Encounter for immunization: Secondary | ICD-10-CM | POA: Diagnosis not present

## 2020-08-13 MED ORDER — PFIZER-BIONT COVID-19 VAC-TRIS 30 MCG/0.3ML IM SUSP
INTRAMUSCULAR | 0 refills | Status: DC
  Filled 2020-08-13: qty 0.3, 1d supply, fill #0

## 2020-08-27 ENCOUNTER — Inpatient Hospital Stay: Payer: Medicare Other | Attending: Hematology & Oncology

## 2020-08-27 ENCOUNTER — Telehealth: Payer: Self-pay | Admitting: *Deleted

## 2020-08-27 ENCOUNTER — Other Ambulatory Visit: Payer: Self-pay

## 2020-08-27 ENCOUNTER — Inpatient Hospital Stay (HOSPITAL_BASED_OUTPATIENT_CLINIC_OR_DEPARTMENT_OTHER): Payer: Medicare Other | Admitting: Hematology & Oncology

## 2020-08-27 ENCOUNTER — Encounter: Payer: Self-pay | Admitting: Hematology & Oncology

## 2020-08-27 VITALS — BP 135/69 | HR 56 | Temp 97.6°F | Resp 20 | Wt 194.0 lb

## 2020-08-27 DIAGNOSIS — Z7982 Long term (current) use of aspirin: Secondary | ICD-10-CM | POA: Insufficient documentation

## 2020-08-27 DIAGNOSIS — D4620 Refractory anemia with excess of blasts, unspecified: Secondary | ICD-10-CM

## 2020-08-27 DIAGNOSIS — Z79899 Other long term (current) drug therapy: Secondary | ICD-10-CM | POA: Diagnosis not present

## 2020-08-27 DIAGNOSIS — D469 Myelodysplastic syndrome, unspecified: Secondary | ICD-10-CM | POA: Diagnosis not present

## 2020-08-27 LAB — CBC WITH DIFFERENTIAL (CANCER CENTER ONLY)
Abs Immature Granulocytes: 0.04 10*3/uL (ref 0.00–0.07)
Basophils Absolute: 0 10*3/uL (ref 0.0–0.1)
Basophils Relative: 0 %
Eosinophils Absolute: 0.1 10*3/uL (ref 0.0–0.5)
Eosinophils Relative: 6 %
HCT: 38.2 % — ABNORMAL LOW (ref 39.0–52.0)
Hemoglobin: 13.1 g/dL (ref 13.0–17.0)
Immature Granulocytes: 2 %
Lymphocytes Relative: 41 %
Lymphs Abs: 1 10*3/uL (ref 0.7–4.0)
MCH: 33.6 pg (ref 26.0–34.0)
MCHC: 34.3 g/dL (ref 30.0–36.0)
MCV: 97.9 fL (ref 80.0–100.0)
Monocytes Absolute: 0.1 10*3/uL (ref 0.1–1.0)
Monocytes Relative: 6 %
Neutro Abs: 1.1 10*3/uL — ABNORMAL LOW (ref 1.7–7.7)
Neutrophils Relative %: 45 %
Platelet Count: 162 10*3/uL (ref 150–400)
RBC: 3.9 MIL/uL — ABNORMAL LOW (ref 4.22–5.81)
RDW: 13.9 % (ref 11.5–15.5)
WBC Count: 2.3 10*3/uL — ABNORMAL LOW (ref 4.0–10.5)
nRBC: 0 % (ref 0.0–0.2)

## 2020-08-27 LAB — CMP (CANCER CENTER ONLY)
ALT: 14 U/L (ref 0–44)
AST: 27 U/L (ref 15–41)
Albumin: 4.1 g/dL (ref 3.5–5.0)
Alkaline Phosphatase: 74 U/L (ref 38–126)
Anion gap: 6 (ref 5–15)
BUN: 14 mg/dL (ref 8–23)
CO2: 32 mmol/L (ref 22–32)
Calcium: 9.1 mg/dL (ref 8.9–10.3)
Chloride: 102 mmol/L (ref 98–111)
Creatinine: 1.16 mg/dL (ref 0.61–1.24)
GFR, Estimated: >60 mL/min (ref >60)
Glucose, Bld: 125 mg/dL — ABNORMAL HIGH (ref 70–99)
Potassium: 3.8 mmol/L (ref 3.5–5.1)
Sodium: 140 mmol/L (ref 135–145)
Total Bilirubin: 0.8 mg/dL (ref 0.3–1.2)
Total Protein: 6.7 g/dL (ref 6.5–8.1)

## 2020-08-27 LAB — RETICULOCYTES
Immature Retic Fract: 8.7 % (ref 2.3–15.9)
RBC.: 3.93 MIL/uL — ABNORMAL LOW (ref 4.22–5.81)
Retic Count, Absolute: 73.5 10*3/uL (ref 19.0–186.0)
Retic Ct Pct: 1.9 % (ref 0.4–3.1)

## 2020-08-27 LAB — IRON AND TIBC
Iron: 94 ug/dL (ref 42–163)
Saturation Ratios: 32 % (ref 20–55)
TIBC: 289 ug/dL (ref 202–409)
UIBC: 195 ug/dL (ref 117–376)

## 2020-08-27 LAB — LACTATE DEHYDROGENASE: LDH: 215 U/L — ABNORMAL HIGH (ref 98–192)

## 2020-08-27 LAB — FERRITIN: Ferritin: 80 ng/mL (ref 24–336)

## 2020-08-27 NOTE — Progress Notes (Signed)
Hematology and Oncology Follow Up Visit  Trevor Cooke [id number] [birth year] 80 y.o. 08/27/2020   Principle Diagnosis:   Myelodysplastic syndrome-low-grade (IPSS-R score = 1) - tp53+  Current Therapy:    Observation     Interim History:  Trevor Cooke is back for follow-up.  We see him every 6 months.  Since we last saw him, he has been doing quite well.  He says that the testosterone injections that he gets does make him feel better.  He and his wife are going down to [place], [place] this weekend.  They have a condominium down there.  I am sure that they will enjoy it.  He has had no problems with infections.  He has had no fever.  He has had no nausea or vomiting.  He has had no cough or shortness of breath.  There is been no rashes.  His last iron studies back in November showed a ferritin of 56 with an iron saturation of 28%.  Overall, his performance status is ECOG 1.    Medications:  Current Outpatient Medications:  .  ascorbic acid (VITAMIN C) 1000 MG tablet, Take 1,000 mg by mouth daily., Disp: , Rfl:  .  aspirin 81 MG tablet, Take 81 mg by mouth daily., Disp: , Rfl:  .  Bioflavonoid Products (ESTER-C) 500-200-60 MG TABS, Take 500 mg by mouth daily., Disp: , Rfl:  .  calcipotriene-betamethasone (TACLONEX SCALP) external suspension, Apply topically daily., Disp: , Rfl:  .  CALCITRIOL EX, Apply topically daily as needed., Disp: , Rfl:  .  Calcium Carbonate (CALCIUM 600 PO), Take 600 mg by mouth daily., Disp: , Rfl:  .  Cholecalciferol (VITAMIN D) 2000 units CAPS, Take by mouth., Disp: , Rfl:  .  clobetasol cream (TEMOVATE) 2.99 %, Apply 1 application topically as needed. , Disp: , Rfl:  .  Emollient (EUCERIN) lotion, Apply topically as needed for dry skin., Disp: , Rfl:  .  enalapril (VASOTEC) 10 MG tablet, Take 1 tablet (10 mg total) by mouth daily., Disp: 90 tablet, Rfl: 3 .  furosemide (LASIX) 20 MG tablet, Take 1 tablet (20 mg total) by mouth daily.,  Disp: 90 tablet, Rfl: 3 .  glucose blood (FREESTYLE PRECISION NEO TEST) test strip, Check glucose 1 time daily dx:E11.9, Disp: 100 each, Rfl: 4 .  labetalol (NORMODYNE) 100 MG tablet, Take 1 tablet (100 mg total) by mouth 2 (two) times daily., Disp: 180 tablet, Rfl: 3 .  levothyroxine (SYNTHROID) 75 MCG tablet, Take 1 tablet (75 mcg total) by mouth daily., Disp: 90 tablet, Rfl: 3 .  lovastatin (MEVACOR) 20 MG tablet, Take 1 tablet (20 mg total) by mouth at bedtime., Disp: 90 tablet, Rfl: 3 .  Multiple Vitamins-Minerals (MULTIVITAMIN WITH MINERALS) tablet, Take by mouth daily., Disp: , Rfl:  .  mupirocin ointment (BACTROBAN) 2 %, Place 1 application into the nose 2 (two) times daily., Disp: , Rfl:  .  NIFEdipine (PROCARDIA XL/NIFEDICAL XL) 60 MG 24 hr tablet, Take 1 tablet (60 mg total) by mouth daily., Disp: 90 tablet, Rfl: 3 .  omeprazole (PRILOSEC) 20 MG capsule, Take 1 capsule by mouth daily., Disp: , Rfl:  .  Selenium 200 MCG TABS, Take 200 mcg by mouth daily., Disp: , Rfl:  .  tadalafil (CIALIS) 20 MG tablet, Take 0.5-1 tablets (10-20 mg total) by mouth every other day as needed for erectile dysfunction., Disp: 5 tablet, Rfl: 11 .  testosterone cypionate (DEPOTESTOSTERONE CYPIONATE) 200 MG/ML injection, Inject 75 mg into  the muscle once a week., Disp: , Rfl:  .  Zinc 50 MG CAPS, Take by mouth., Disp: , Rfl:   Allergies: No Known Allergies  Past Medical History, Surgical history, Social history, and Family History were reviewed and updated.  Review of Systems: Review of Systems  Constitutional: Negative.   HENT: Negative.   Eyes: Negative.   Respiratory: Negative.   Cardiovascular: Negative.   Gastrointestinal: Negative.   Genitourinary: Negative.   Musculoskeletal: Negative.   Skin: Negative.   Neurological: Negative.   Endo/Heme/Allergies: Negative.   Psychiatric/Behavioral: Negative.      Physical Exam:  weight is 88 kg. His oral temperature is 97.6 F (36.4 C). His blood  pressure is 135/69 and his pulse is 56 (abnormal). His respiration is 20 and oxygen saturation is 98%.   Wt Readings from Last 3 Encounters:  08/27/20 88 kg  03/09/20 86.7 kg  02/23/20 87.7 kg     Physical Exam Vitals reviewed.  HENT:     Head: Normocephalic and atraumatic.  Eyes:     Pupils: Pupils are equal, round, and reactive to light.  Cardiovascular:     Rate and Rhythm: Normal rate and regular rhythm.     Heart sounds: Normal heart sounds.  Pulmonary:     Effort: Pulmonary effort is normal.     Breath sounds: Normal breath sounds.  Abdominal:     General: Bowel sounds are normal.     Palpations: Abdomen is soft.  Musculoskeletal:        General: No tenderness or deformity. Normal range of motion.     Cervical back: Normal range of motion.  Lymphadenopathy:     Cervical: No cervical adenopathy.  Skin:    General: Skin is warm and dry.     Findings: No erythema or rash.  Neurological:     Mental Status: He is alert and oriented to person, place, and time.  Psychiatric:        Behavior: Behavior normal.        Thought Content: Thought content normal.        Judgment: Judgment normal.      Lab Results  Component Value Date   WBC 2.3 (L) 08/27/2020   HGB 13.1 08/27/2020   HCT 38.2 (L) 08/27/2020   MCV 97.9 08/27/2020   PLT 162 08/27/2020     Chemistry      Component Value Date/Time   NA 140 08/27/2020 0853   NA 144 04/26/2016 1117   K 3.8 08/27/2020 0853   K 3.9 04/26/2016 1117   CL 102 08/27/2020 0853   CL 98 04/26/2016 1117   CO2 32 08/27/2020 0853   CO2 18 04/26/2016 1117   BUN 14 08/27/2020 0853   BUN 13 04/26/2016 1117   CREATININE 1.16 08/27/2020 0853   CREATININE 1.2 04/26/2016 1117      Component Value Date/Time   CALCIUM 9.1 08/27/2020 0853   CALCIUM 9.1 04/26/2016 1117   ALKPHOS 74 08/27/2020 0853   ALKPHOS 99 (H) 04/26/2016 1117   AST 27 08/27/2020 0853   ALT 14 08/27/2020 0853   ALT 22 04/26/2016 1117   BILITOT 0.8 08/27/2020  0853      Impression and Plan: Trevor Cooke is a 80 year old white male. He has myelodysplasia. This is manifested as leukopenia.   I looked at his blood under the microscope.  I did not see any immature myeloid cells.  He has no nucleated red blood cells.  His platelets look fine and are  well granulated.  He had no blasts.  From my point of view, his myelodysplasia is holding nice and steady right now.  I am happy about this.  His quality of life is doing well.  I still think we can follow him up in 6 months.  I just would not imagine that his blood counts are going to change significantly in that time period.   Volanda Napoleon, MD 6/3/20229:53 AM

## 2020-08-27 NOTE — Telephone Encounter (Signed)
Per 08/27/20 los - gave patient upcoming appointments

## 2020-09-13 ENCOUNTER — Telehealth: Payer: Medicare Other

## 2020-10-07 ENCOUNTER — Ambulatory Visit (INDEPENDENT_AMBULATORY_CARE_PROVIDER_SITE_OTHER): Payer: Medicare Other | Admitting: Pharmacist

## 2020-10-07 DIAGNOSIS — I10 Essential (primary) hypertension: Secondary | ICD-10-CM

## 2020-10-07 DIAGNOSIS — E119 Type 2 diabetes mellitus without complications: Secondary | ICD-10-CM | POA: Diagnosis not present

## 2020-10-07 DIAGNOSIS — E785 Hyperlipidemia, unspecified: Secondary | ICD-10-CM

## 2020-10-10 NOTE — Chronic Care Management (AMB) (Signed)
Chronic Care Management Pharmacy Note  10/10/2020 Name:  Trevor Cooke MRN:  [id number] DOB:  [birth year]   Subjective: Trevor Cooke is an 80 y.o. year old male who is a primary patient of [name], DO.  The CCM team was consulted for assistance with disease management and care coordination needs.    Engaged with patient by telephone for follow up visit in response to provider referral for pharmacy case management and/or care coordination services.   Consent to Services:  The patient was given information about Chronic Care Management services, agreed to services, and gave verbal consent prior to initiation of services.  Please see initial visit note for detailed documentation.   Patient Care Team: Carollee Herter, Alferd Apa, DO as PCP - General (Family Medicine) Eduard Roux, MD (Gastroenterology) Renita Papa, MD as Referring Physician (Dermatology) Paulla Dolly Tamala Fothergill, DPM as Consulting Physician (Podiatry) Marin Olp Rudell Cobb, MD as Consulting Physician (Oncology) Robbie Louis, MD as Referring Physician (Urology) Cherre Robins, PharmD (Pharmacist)  Recent office visits: 03/09/2020 - PCP (Dr [name]) F/U chronci conditions. Added tadalafil 10 to 20 mg prn  Recent consult visits: 08/27/2020 - Hem / Onc (Dr. Marin Olp) F/U MDS; famotidine stopped per med list but not mentioned in OV notes.  07/16/2020 - GI (Dr Truddie Crumble [facility]) Evaluation for dysphagia.  Doing well since omeprazole started. Ordered EGD; Lifestyle modifications for GERD. [facility] - Hypogonadism. Continued testosterone replacement therapy. Checked labs.   Hospital visits: None in previous 6 months  Objective:  Lab Results  Component Value Date   CREATININE 1.16 08/27/2020   CREATININE 1.08 03/09/2020   CREATININE 1.24 02/23/2020    Lab Results  Component Value Date   HGBA1C 5.9 03/09/2020   Last diabetic Eye exam:  Lab Results  Component Value  Date/Time   HMDIABEYEEXA No Retinopathy 12/26/2014 12:00 AM    Last diabetic Foot exam: No results found for: HMDIABFOOTEX      Component Value Date/Time   CHOL 109 03/09/2020 1007   TRIG 69.0 03/09/2020 1007   TRIG 74 04/03/2006 1152   HDL 32.00 (L) 03/09/2020 1007   CHOLHDL 3 03/09/2020 1007   VLDL 13.8 03/09/2020 1007   LDLCALC 63 03/09/2020 1007    Hepatic Function Latest Ref Rng & Units 08/27/2020 03/09/2020 02/23/2020  Total Protein 6.5 - 8.1 g/dL 6.7 7.1 7.4  Albumin 3.5 - 5.0 g/dL 4.1 4.3 4.3  AST 15 - 41 U/L 27 21 24   ALT 0 - 44 U/L 14 15 13   Alk Phosphatase 38 - 126 U/L 74 78 78  Total Bilirubin 0.3 - 1.2 mg/dL 0.8 0.6 0.5  Bilirubin, Direct 0.0 - 0.3 mg/dL - - -    Lab Results  Component Value Date/Time   TSH 4.38 03/09/2020 10:07 AM   TSH 2.16 09/15/2019 09:32 AM   FREET4 0.71 12/28/2015 08:55 AM   FREET4 0.77 09/18/2012 11:07 AM    CBC Latest Ref Rng & Units 08/27/2020 03/09/2020 02/23/2020  WBC 4.0 - 10.5 K/uL 2.3(L) 2.4(L) 2.5(L)  Hemoglobin 13.0 - 17.0 g/dL 13.1 13.9 13.5  Hematocrit 39.0 - 52.0 % 38.2(L) 40.9 39.8  Platelets 150 - 400 K/uL 162 163.0 192    No results found for: VD25OH  Clinical ASCVD: No  The ASCVD Risk score Mikey Bussing DC Jr., et al., 2013) failed to calculate for the following reasons:   The 2013 ASCVD risk score is only valid for ages 75 to 61  Social History   Tobacco Use  Smoking Status Former  Smokeless Tobacco Never  Tobacco Comments   quit 1970   BP Readings from Last 3 Encounters:  08/27/20 135/69  03/09/20 140/82  02/23/20 135/66   Pulse Readings from Last 3 Encounters:  08/27/20 (!) 56  03/09/20 72  02/23/20 (!) 55   Wt Readings from Last 3 Encounters:  08/27/20 194 lb 0.6 oz (88 kg)  03/09/20 191 lb 3.2 oz (86.7 kg)  02/23/20 193 lb 4 oz (87.7 kg)    Assessment: Review of patient past medical history, allergies, medications, health status, including review of consultants reports, laboratory and other test  data, was performed as part of comprehensive evaluation and provision of chronic care management services.   SDOH:  (Social Determinants of Health) assessments and interventions performed:  SDOH Interventions    Flowsheet Row Most Recent Value  SDOH Interventions   Financial Strain Interventions Intervention Not Indicated  Physical Activity Interventions Other (Comments)  [encouraged patient increase physical activity to goal of 150 minutes per week]       CCM Care Plan  No Known Allergies  Medications Reviewed Today     Reviewed by Cherre Robins, PharmD (Pharmacist) on 10/07/20 at 1416  Med List Status: <None>   Medication Order Taking? Sig Documenting Provider Last Dose Status Informant  aspirin 81 MG tablet [id number] Yes Take 81 mg by mouth daily. [provider] Taking Active   Bioflavonoid Products (ESTER-C) 500-200-60 MG TABS [id number] Yes Take 500 mg by mouth daily. [provider] Taking Active   calcipotriene-betamethasone [facility] SCALP) external suspension [id number] Yes Apply topically daily. [provider] Taking Active Self  CALCITRIOL EX [id number] Yes Apply topically daily as needed. [provider] Taking Active Self  Calcium Carbonate (CALCIUM 600 PO) [id number] Yes Take 600 mg by mouth daily. [provider] Taking Active   Cholecalciferol (VITAMIN D) 2000 units CAPS [id number] Yes Take 2,000 Units by mouth daily. [provider] Taking Active   clobetasol cream (TEMOVATE) 0.05 % [id number] No Apply 1 application topically as needed.   Patient not taking: Reported on 10/07/2020   [provider] Not Taking Active   Emollient (EUCERIN) lotion [id number] Yes Apply topically as needed for dry skin. [provider] Taking Active Self  enalapril (VASOTEC) 10 MG tablet [id number] Yes Take 1 tablet (10 mg total) by mouth daily.  Patient taking differently: Take 10 mg by mouth daily. night   Roma Schanz R, DO Taking Active   furosemide (LASIX) 20 MG tablet [id number] Yes Take 1 tablet (20 mg total) by mouth daily. Roma Schanz R, DO Taking Active   glucose blood (FREESTYLE PRECISION NEO TEST) test strip [id number] Yes Check glucose 1 time daily dx:E11.9 Carollee Herter, Alferd Apa, DO Taking Active   labetalol (NORMODYNE) 100 MG tablet [id number] Yes Take 1 tablet (100 mg total) by mouth 2 (two) times daily. [name], DO Taking Active   levothyroxine (SYNTHROID) 75 MCG tablet [id number] Yes Take 1 tablet (75 mcg total) by mouth daily. [name], DO Taking Active   lovastatin (MEVACOR) 20 MG tablet [id number] Yes Take 1 tablet (20 mg total) by mouth at bedtime. [name], DO Taking Active   Multiple Vitamins-Minerals (MULTIVITAMIN WITH MINERALS) tablet [id number] Yes Take 1 tablet by mouth daily. CitraVit [provider] Taking Active            Med Note (BYRD, RONECIA E  Mon May 03, 2015 10:04 AM) Received from: [facility] Received Sig: Take by mouth.  NIFEdipine (PROCARDIA XL/NIFEDICAL XL) 60 MG 24 hr tablet [id number] Yes Take 1 tablet (60 mg total) by mouth daily. Roma Schanz R, DO Taking Active   omeprazole (PRILOSEC) 20 MG capsule [id number] Yes Take 1 capsule by mouth daily. [provider] Taking Active   tadalafil (CIALIS) 20 MG tablet [id number] Yes Take 0.5-1 tablets (10-20 mg total) by mouth every other day as needed for erectile dysfunction. [name], DO Taking Active   testosterone cypionate (DEPOTESTOSTERONE CYPIONATE) 200 MG/ML injection [id number] Yes Inject 75 mg into the muscle once a week. [provider] Taking Active Self  Zinc 50 MG CAPS [id number] Yes Take 1 capsule by mouth daily. [provider] Taking Active             Patient Active Problem List   Diagnosis Date Noted   MDS (myelodysplastic syndrome), low grade ([place]) 04/26/2016   Arthritis 08/01/2011    Routine health maintenance 08/01/2011   PSORIASIS 05/31/2010   CHEST WALL PAIN, ACUTE 04/12/2010   Hypogonadism male 02/12/2007   Hypothyroidism 10/06/2006   Diabetes mellitus, type 2 ([place]) 10/06/2006   Hyperlipidemia 10/06/2006   Essential hypertension 10/06/2006   GERD 10/06/2006   Controlled type 2 diabetes mellitus without complication ([place]) 24/58/0998    Immunization History  Administered Date(s) Administered   Fluad Quad(high Dose 65+) 01/02/2019, 01/08/2020   H1N1 04/21/2008   Influenza Split 01/30/2012   Influenza Whole 12/28/2003, 01/02/2008, 12/29/2009   Influenza, High Dose Seasonal PF 01/27/2013, 01/30/2014, 01/28/2015, 01/27/2016, 01/25/2017, 01/23/2018   PFIZER Comirnaty(Gray Top)Covid-19 Tri-Sucrose Vaccine 04/19/2019, 05/10/2019, 08/12/2020   PFIZER(Purple Top)SARS-COV-2 Vaccination 04/19/2019, 05/10/2019, 01/29/2020   Pneumococcal Conjugate-13 04/07/2013   Pneumococcal Polysaccharide-23 04/12/2010, 09/15/2019   Td 09/25/1995   Tdap 10/21/2010   Zoster Recombinat (Shingrix) 11/12/2017, 02/07/2018   Zoster, Live 01/30/2012    Conditions to be addressed/monitored: HTN, HLD, DMII, and osteopenia  Care Plan : General Pharmacy (Adult)  Updates made by Cherre Robins, PHARMD since 10/10/2020 12:00 AM     Problem: HTN, HLD, DM, Hypothyroidism   Priority: High  Onset Date: 06/08/2020     Long-Range Goal: Patient-Specific Goal   Start Date: 06/07/2020  Expected End Date: 12/09/2020  Recent Progress: On track  Priority: High  Note:   Current Barriers:  No barriers identified at today's visit.   Pharmacist Clinical Goal(s):  Over the next 180 days, patient will achieve adherence to monitoring guidelines and medication adherence to achieve therapeutic efficacy maintain control of blood pressure and glucose as evidenced by maintaining goals listed below adhere to prescribed medication regimen as evidenced by filld ates through collaboration with PharmD and  provider.   Interventions: 1:1 collaboration with Carollee Herter, Alferd Apa, DO regarding development and update of comprehensive plan of care as evidenced by provider attestation and co-signature Inter-disciplinary care team collaboration (see longitudinal plan of care) Comprehensive medication review performed; medication list updated in electronic medical record  Hypertension (BP goal <140/90) Controlled Current treatment: Enalapril 69m daily Labetaolol 1077mtwice daily Furosemide 2022maily Nifedipine XL 38m42mily (fills at [employer] insurance used) Medications previously tried: none noted  Current home readings: 141/77; 135/70; 142/78; 138/77 Denies hypotensive/hypertensive symptoms Interventions:  Reviewed BP goals and benefits of medications for prevention of heart attack, stroke and kidney damage; Continue to walk for exercise; Discussed exercise goal of 150 minutes per week; Recommended continue to monitor BP  at home daily, document, and provide log at future appointments Recommended to continue current medication  Hyperlipidemia: (LDL goal < 70) Controlled Current treatment: Lovastatin 71m daily Medications previously tried: none noted Diet Wife does a lot of cooking, they don't eat out much B - Quiche, polenta, bacon, grits, fruits L - Cabbage, beans; PB sandwich; Banana sandwich D - same as above including sweet potatoes Snacks - Peanuts, candy Drinks - Water, pepsi every other day, coffee (half caffienated/half de-caf), water Exercise:  walking for 35 minutes 3 days per week and yardwork.  Interventions:  Educated on Cholesterol goals;  Discussed benefits of statin for ASCVD risk reduction; \ Recommended to continue current medication  Diabetes (A1c goal <7%) Controlled Current medications: None Medications previously tried: none noted  Current home glucose readings: 107; 92; 126 Report no BP <80 or over 150 Denies hypoglycemic/hyperglycemic  symptoms Current meal patterns: see above Current exercise: see above Interventions:  Reviewed A1c goal Reviewed home blood glucose readings and reviewed goals  Fasting blood glucose goal (before meals) = 80 to 130 Blood glucose goal after a meal = less than 180  Discussed exercise goal of 150 minutes per week;  Counseled to check feet daily and get yearly eye exams Recommended continue current management strategies  Osteopenia (Goal Prevent fractures) Controlled Last DEXA Scan: 12/02/19             T-Score femoral neck: -2.2             T-Score total hip: -1.2             T-Score lumbar spine: -0.7             10-year probability of major osteoporotic fracture: 11%             10-year probability of hip fracture: 5.1%  Patient is a candidate for pharmacologic treatment due to T-Score -1.0 to -2.5 and 10-year risk of hip fracture > 3% Of note patient mentions he has had swallowing trouble in the past and will have EGD 10/11/2020. Take into consideration if plan to start pharmacotherpay in future.  Current treatment  Calcium 6052mdaily Vitamin D 2000 units daily Medications previously tried: none noted Interventions (addressed at previous visits:  Recommend [phone] units of vitamin D daily. Recommend 1200 mg of calcium daily from dietary and supplemental sources.  Recommended to continue current supplementation   Patient Goals/Self-Care Activities Over the next 90 days, patient will:  Take medications as prescribed check blood pressure daily, document, and provide at future appointments target a minimum of 150 minutes of moderate intensity exercise weekly  Follow Up Plan: The care management team will reach out to the patient again over the next 180 days.       Medication Assistance: None required.  Patient affirms current coverage meets needs.  [facility]'s [facility] pharmacy [facility]  [id number] [place] [address] [place]: [phone]: [address] [place]: [phone]: [phone] [place] [address]one: [phone]: [phone] Up:  Patient agrees to Care Plan and Follow-up.  Plan: Telephone follow up appointment with care management team member scheduled for:  6 months  .tbe

## 2020-10-10 NOTE — Patient Instructions (Signed)
Visit Information  PATIENT GOALS:  Goals Addressed             This Visit's Progress    Chronic Care Management Pharmacy Care Plan   On track    CARE PLAN ENTRY (see longitudinal plan of care for additional care plan information)  Current Barriers:  Chronic Disease Management support, education, and care coordination needs related to Hypertension, Hyperlipidemia/Hx of Stroke, Diabetes, Hypothyroidism, Osteopenia, Hypogonadism, GERD, Psoriasis   Hypertension BP Readings from Last 3 Encounters:  08/27/20 135/69  03/09/20 140/82  02/23/20 135/66  Pharmacist Clinical Goal(s): Over the next 90 days, patient will work with PharmD and providers to maintain BP goal <140/90 Current regimen:  Enalapril 10mg  daily Labetaolol 100mg  twice daily Furosemide 20mg  daily Nifedipine XL 60mg  daily Interventions: Discussed options of combo htn meds Discussed purchasing nifedipine at more affordable price to maintain regimen (pt [facility]_ Patient self care activities - Over the next 90 days, patient will: Maintain hypertension medication regimen.   Hyperlipidemia Lab Results  Component Value Date/Time   LDLCALC 63 03/09/2020 10:07 AM  Pharmacist Clinical Goal(s): Over the next 90 days, patient will work with PharmD and providers to achieve LDL goal < 70 Current regimen:  Lovastatin 20mg  daily Interventions: Discussed diet and exercise extensively Discussed exercise goal of walking 3 days a week for 30 minutes Discussed decreasing cholesterol containing foods Patient self care activities - Over the next 90 days, patient will: Maintain hypertension medication regimen.  Increase exercise as able to goal of 150 minutes per week of physical activity like walking  Decrease cholesterol containing foods  Diabetes Lab Results  Component Value Date/Time   HGBA1C 5.9 03/09/2020 10:07 AM   HGBA1C 6.1 03/17/2019 09:37 AM  Pharmacist Clinical Goal(s): Over the next 90 days,  patient will work with PharmD and providers to maintain A1c goal <7% Current regimen:  Diet and exercise management   Interventions: Discussed diet and exercise Patient self care activities - Over the next 90 days, patient will: Maintain a1c <7%  Osteopenia Pharmacist Clinical Goal(s) Over the next 90 days, patient will work with PharmD and providers to reduce risk of fracture due to osteopenia Current regimen:  Calcium 600mg  daily Vitamin D 2000 units daily Interventions: Discussed indication for bisphosphonate therapy noting risk of hip fracture greater than 3% Discussed intake of 1200mg  of calcium daily through diet and/or supplementation Discussed intake of [phone] units of vitamin D through supplementation  Patient self care activities - Over the next 90 days, patient will: Ensure intake of 1200mg  of calcium daily from combination of supplementation and diet intake  Health Maintenance  Pharmacist Clinical Goal(s) Over the next 90 days, patient will work with PharmD and providers to complete health maintenance screenings/vaccinations Interventions: Discussed follow up with gastro (Dr. Nyoka Cowden) Due to have 6 month follow up with Primary care provider.  Patient self care activities - Over the next 90 days, patient will: Scheduled for endoscopy 10/11/2020 Scheduled follow up with primary care provider - 10/15/2020 at 9am  Medication management Pharmacist Clinical Goal(s): Over the next 90 days, patient will work with PharmD and providers to maintain optimal medication adherence [facility] Interventions Comprehensive medication review performed. Continue current medication management strategy Patient self care activities - Over the next 90 days, patient will: Focus on medication adherence by filling and taking medications appropriately  Take medications as prescribed Report any questions or concerns to PharmD and/or provider(s)  Please see  past updates  related to this goal by clicking on the "Past Updates" button in the selected goal          Patient verbalizes understanding of instructions provided today and agrees to view in [place].   Telephone follow up appointment with care management team member scheduled for: 6 months  Cherre Robins, PharmD Clinical Pharmacist [place] [facility]

## 2020-10-11 DIAGNOSIS — R1319 Other dysphagia: Secondary | ICD-10-CM | POA: Diagnosis not present

## 2020-10-11 DIAGNOSIS — K2950 Unspecified chronic gastritis without bleeding: Secondary | ICD-10-CM | POA: Diagnosis not present

## 2020-10-11 DIAGNOSIS — K222 Esophageal obstruction: Secondary | ICD-10-CM | POA: Diagnosis not present

## 2020-10-11 DIAGNOSIS — K449 Diaphragmatic hernia without obstruction or gangrene: Secondary | ICD-10-CM | POA: Diagnosis not present

## 2020-10-11 DIAGNOSIS — K219 Gastro-esophageal reflux disease without esophagitis: Secondary | ICD-10-CM | POA: Diagnosis not present

## 2020-10-15 ENCOUNTER — Encounter: Payer: Self-pay | Admitting: Family Medicine

## 2020-10-15 ENCOUNTER — Other Ambulatory Visit: Payer: Self-pay

## 2020-10-15 ENCOUNTER — Ambulatory Visit (INDEPENDENT_AMBULATORY_CARE_PROVIDER_SITE_OTHER): Payer: Medicare Other | Admitting: Family Medicine

## 2020-10-15 VITALS — BP 140/70 | HR 53 | Temp 97.7°F | Resp 16 | Ht 68.0 in | Wt 189.0 lb

## 2020-10-15 DIAGNOSIS — E785 Hyperlipidemia, unspecified: Secondary | ICD-10-CM | POA: Diagnosis not present

## 2020-10-15 DIAGNOSIS — E1169 Type 2 diabetes mellitus with other specified complication: Secondary | ICD-10-CM

## 2020-10-15 DIAGNOSIS — E119 Type 2 diabetes mellitus without complications: Secondary | ICD-10-CM

## 2020-10-15 DIAGNOSIS — I10 Essential (primary) hypertension: Secondary | ICD-10-CM

## 2020-10-15 DIAGNOSIS — E039 Hypothyroidism, unspecified: Secondary | ICD-10-CM | POA: Diagnosis not present

## 2020-10-15 DIAGNOSIS — D4620 Refractory anemia with excess of blasts, unspecified: Secondary | ICD-10-CM | POA: Diagnosis not present

## 2020-10-15 LAB — LIPID PANEL
Cholesterol: 92 mg/dL (ref 0–200)
HDL: 30.2 mg/dL — ABNORMAL LOW (ref >39.00)
LDL Cholesterol: 51 mg/dL (ref 0–99)
NonHDL: 61.35
Total CHOL/HDL Ratio: 3
Triglycerides: 54 mg/dL (ref 0.0–149.0)
VLDL: 10.8 mg/dL (ref 0.0–40.0)

## 2020-10-15 LAB — COMPREHENSIVE METABOLIC PANEL
ALT: 14 U/L (ref 0–53)
AST: 23 U/L (ref 0–37)
Albumin: 4.2 g/dL (ref 3.5–5.2)
Alkaline Phosphatase: 77 U/L (ref 39–117)
BUN: 16 mg/dL (ref 6–23)
CO2: 31 mEq/L (ref 19–32)
Calcium: 8.5 mg/dL (ref 8.4–10.5)
Chloride: 100 mEq/L (ref 96–112)
Creatinine, Ser: 1.15 mg/dL (ref 0.40–1.50)
GFR: 59.65 mL/min — ABNORMAL LOW (ref >60.00)
Glucose, Bld: 110 mg/dL — ABNORMAL HIGH (ref 70–99)
Potassium: 4 mEq/L (ref 3.5–5.1)
Sodium: 137 mEq/L (ref 135–145)
Total Bilirubin: 0.9 mg/dL (ref 0.2–1.2)
Total Protein: 6.8 g/dL (ref 6.0–8.3)

## 2020-10-15 LAB — HEMOGLOBIN A1C: Hgb A1c MFr Bld: 6.1 % (ref 4.6–6.5)

## 2020-10-15 LAB — TSH: TSH: 3.29 u[IU]/mL (ref 0.35–5.50)

## 2020-10-15 MED ORDER — LABETALOL HCL 100 MG PO TABS: 100.0000 mg | ORAL_TABLET | Freq: Two times a day (BID) | ORAL | 3 refills | Status: DC

## 2020-10-15 MED ORDER — FUROSEMIDE 20 MG PO TABS: 20.0000 mg | ORAL_TABLET | Freq: Every day | ORAL | 3 refills | Status: DC

## 2020-10-15 MED ORDER — ENALAPRIL MALEATE 10 MG PO TABS: 10.0000 mg | ORAL_TABLET | Freq: Every day | ORAL | 3 refills | Status: DC

## 2020-10-15 MED ORDER — NIFEDIPINE ER OSMOTIC RELEASE 60 MG PO TB24: 60.0000 mg | ORAL_TABLET | Freq: Every day | ORAL | 3 refills | Status: DC

## 2020-10-15 MED ORDER — LEVOTHYROXINE SODIUM 75 MCG PO TABS: 75.0000 ug | ORAL_TABLET | Freq: Every day | ORAL | 3 refills | Status: DC

## 2020-10-15 MED ORDER — LOVASTATIN 20 MG PO TABS: 20.0000 mg | ORAL_TABLET | Freq: Every day | ORAL | 3 refills | Status: DC

## 2020-10-15 NOTE — Assessment & Plan Note (Signed)
Well controlled, no changes to meds. Encouraged heart healthy diet such as the DASH diet and exercise as tolerated.  °

## 2020-10-15 NOTE — Assessment & Plan Note (Signed)
Stable Check labs 

## 2020-10-15 NOTE — Patient Instructions (Signed)

## 2020-10-15 NOTE — Assessment & Plan Note (Signed)
Check labs  Diet controlled

## 2020-10-15 NOTE — Assessment & Plan Note (Signed)
Encourage heart healthy diet such as MIND or DASH diet, increase exercise, avoid trans fats, simple carbohydrates and processed foods, consider a krill or fish or flaxseed oil cap daily.  °

## 2020-10-15 NOTE — Progress Notes (Signed)
Patient ID: Trevor Cooke, male    DOB: 1940-11-19  Age: 80 y.o. MRN: [id number]    Subjective:  Subjective  HPI Trevor Cooke presents for an office visit and f/u on HLD, HTN, and T2DM today. He complains of lesion on his R shin. He notes that he has the lesion for months and the area won't heal.  Pt is planning on going to his dermatologist.  He endorses taking 20 mg Lovastatin PO QD for his dx of HLD. Lab Results  Component Value Date   CHOL 109 03/09/2020   CHOL 130 09/15/2019   CHOL 136 03/17/2019   Lab Results  Component Value Date   HDL 32.00 (L) 03/09/2020   HDL 40.60 09/15/2019   HDL 45.50 03/17/2019   Lab Results  Component Value Date   LDLCALC 63 03/09/2020   LDLCALC 76 09/15/2019   LDLCALC 72 03/17/2019   Lab Results  Component Value Date   TRIG 69.0 03/09/2020   TRIG 69.0 09/15/2019   TRIG 96.0 03/17/2019   Lab Results  Component Value Date   CHOLHDL 3 03/09/2020   CHOLHDL 3 09/15/2019   CHOLHDL 3 03/17/2019  He notes taking 10 mg Vasotec PO QD, 20 mg Lasix PO QD, 100 mg Labetalol BID, and 60 mg nifedipine PO QD for his dx of HTN.   BP Readings from Last 3 Encounters:  10/15/20 140/70  08/27/20 135/69  03/09/20 140/82  He states that his blood sugar level is well managed with diet.  Lab Results  Component Value Date   HGBA1C 5.9 03/09/2020  He denies any chest pain, SOB, fever, abdominal pain, cough, chills, sore throat, dysuria, urinary incontinence, back pain, HA, or N/V/D at this time.    Review of Systems  Constitutional:  Negative for chills, fatigue and fever.  HENT:  Negative for ear pain, rhinorrhea, sinus pressure, sinus pain, sore throat and tinnitus.   Eyes:  Negative for pain.  Respiratory:  Negative for cough, shortness of breath and wheezing.   Cardiovascular:  Negative for chest pain.  Gastrointestinal:  Negative for abdominal pain, anal bleeding, constipation, diarrhea, nausea and vomiting.  Genitourinary:  Negative for  flank pain.  Musculoskeletal:  Negative for back pain and neck pain.  Skin:  Negative for rash.       (+) lesion R shin   Neurological:  Negative for seizures, weakness, light-headedness, numbness and headaches.   History Past Medical History:  Diagnosis Date   Arthritis    in neck   CVA (cerebral infarction) 1996   GERD (gastroesophageal reflux disease)    History of prediabetes    Hyperlipidemia    Hypertension    MDS (myelodysplastic syndrome), low grade ([place]) 04/26/2016   Neutropenia ([place])    Psoriasis    Thyroid disease     He has a past surgical history that includes Cleft palate repair (1940's).   His family history includes Cancer in his maternal uncle; Diabetes in his maternal uncle and sister; Hypertension in his father and mother; Stroke in his father.He reports that he has quit smoking. He has never used smokeless tobacco. He reports current alcohol use. He reports that he does not use drugs.  Current Outpatient Medications on File Prior to Visit  Medication Sig Dispense Refill   aspirin 81 MG tablet Take 81 mg by mouth daily.     Bioflavonoid Products (ESTER-C) 500-200-60 MG TABS Take 500 mg by mouth daily.     calcipotriene-betamethasone (TACLONEX SCALP) external suspension Apply  topically daily.     CALCITRIOL EX Apply topically daily as needed.     Calcium Carbonate (CALCIUM 600 PO) Take 600 mg by mouth daily.     Cholecalciferol (VITAMIN D) 2000 units CAPS Take 2,000 Units by mouth daily.     clobetasol cream (TEMOVATE) AB-123456789 % Apply 1 application topically as needed.     Emollient (EUCERIN) lotion Apply topically as needed for dry skin.     glucose blood (FREESTYLE PRECISION NEO TEST) test strip Check glucose 1 time daily dx:E11.9 100 each 4   Multiple Vitamins-Minerals (MULTIVITAMIN WITH MINERALS) tablet Take 1 tablet by mouth daily. CitraVit     omeprazole (PRILOSEC) 20 MG capsule Take 1 capsule by mouth daily.     tadalafil (CIALIS) 20 MG tablet Take 0.5-1  tablets (10-20 mg total) by mouth every other day as needed for erectile dysfunction. 5 tablet 11   testosterone cypionate (DEPOTESTOSTERONE CYPIONATE) 200 MG/ML injection Inject 75 mg into the muscle once a week.     Zinc 50 MG CAPS Take 1 capsule by mouth daily.     No current facility-administered medications on file prior to visit.     Objective:  Objective  Physical Exam Vitals and nursing note reviewed.  Constitutional:      General: He is not in acute distress.    Appearance: Normal appearance. He is well-developed. He is not ill-appearing.  HENT:     Head: Normocephalic and atraumatic.     Right Ear: External ear normal.     Left Ear: External ear normal.     Nose: Nose normal.  Eyes:     General:        Right eye: No discharge.        Left eye: No discharge.     Extraocular Movements: Extraocular movements intact.     Pupils: Pupils are equal, round, and reactive to light.  Cardiovascular:     Rate and Rhythm: Normal rate and regular rhythm.     Pulses: Normal pulses.     Heart sounds: Normal heart sounds. No murmur heard.   No friction rub. No gallop.  Pulmonary:     Effort: Pulmonary effort is normal. No respiratory distress.     Breath sounds: Normal breath sounds. No stridor. No wheezing, rhonchi or rales.  Chest:     Chest wall: No tenderness.  Abdominal:     General: Bowel sounds are normal. There is no distension.     Palpations: Abdomen is soft. There is no mass.     Tenderness: There is no abdominal tenderness. There is no guarding or rebound.     Hernia: No hernia is present.  Musculoskeletal:        General: Normal range of motion.     Cervical back: Normal range of motion and neck supple.     Right lower leg: No edema.     Left lower leg: No edema.  Skin:    General: Skin is warm and dry.     Findings: Erythema and lesion present.     Comments: There is 3/4 inch wide by 1 inch long , erythremic scaly lesion  Neurological:     Mental Status: He is  alert and oriented to person, place, and time.  Psychiatric:        Behavior: Behavior normal.        Thought Content: Thought content normal.    BP 140/70 (BP Location: Left Arm, Patient Position: Sitting, Cuff Size: Normal)  Pulse (!) 53   Temp 97.7 F (36.5 C) (Oral)   Resp 16   Ht '5\' 8"'$  (1.727 m)   Wt 189 lb (85.7 kg)   SpO2 97%   BMI 28.74 kg/m  Wt Readings from Last 3 Encounters:  10/15/20 189 lb (85.7 kg)  08/27/20 194 lb 0.6 oz (88 kg)  03/09/20 191 lb 3.2 oz (86.7 kg)     Lab Results  Component Value Date   WBC 2.3 (L) 08/27/2020   HGB 13.1 08/27/2020   HCT 38.2 (L) 08/27/2020   PLT 162 08/27/2020   GLUCOSE 125 (H) 08/27/2020   CHOL 109 03/09/2020   TRIG 69.0 03/09/2020   HDL 32.00 (L) 03/09/2020   LDLCALC 63 03/09/2020   ALT 14 08/27/2020   AST 27 08/27/2020   NA 140 08/27/2020   K 3.8 08/27/2020   CL 102 08/27/2020   CREATININE 1.16 08/27/2020   BUN 14 08/27/2020   CO2 32 08/27/2020   TSH 4.38 03/09/2020   PSA 0.40 03/09/2020   HGBA1C 5.9 03/09/2020   MICROALBUR 0.8 03/17/2019    No results found.   Assessment & Plan:  Plan    Meds ordered this encounter  Medications   DISCONTD: NIFEdipine (PROCARDIA XL/NIFEDICAL XL) 60 MG 24 hr tablet    Sig: Take 1 tablet (60 mg total) by mouth daily.    Dispense:  90 tablet    Refill:  3   DISCONTD: lovastatin (MEVACOR) 20 MG tablet    Sig: Take 1 tablet (20 mg total) by mouth at bedtime.    Dispense:  90 tablet    Refill:  3   DISCONTD: furosemide (LASIX) 20 MG tablet    Sig: Take 1 tablet (20 mg total) by mouth daily.    Dispense:  90 tablet    Refill:  3   DISCONTD: labetalol (NORMODYNE) 100 MG tablet    Sig: Take 1 tablet (100 mg total) by mouth 2 (two) times daily.    Dispense:  180 tablet    Refill:  3   DISCONTD: enalapril (VASOTEC) 10 MG tablet    Sig: Take 1 tablet (10 mg total) by mouth daily.    Dispense:  90 tablet    Refill:  3   levothyroxine (SYNTHROID) 75 MCG tablet    Sig:  Take 1 tablet (75 mcg total) by mouth daily.    Dispense:  90 tablet    Refill:  3   NIFEdipine (PROCARDIA XL/NIFEDICAL XL) 60 MG 24 hr tablet    Sig: Take 1 tablet (60 mg total) by mouth daily.    Dispense:  90 tablet    Refill:  3   lovastatin (MEVACOR) 20 MG tablet    Sig: Take 1 tablet (20 mg total) by mouth at bedtime.    Dispense:  90 tablet    Refill:  3   furosemide (LASIX) 20 MG tablet    Sig: Take 1 tablet (20 mg total) by mouth daily.    Dispense:  90 tablet    Refill:  3   labetalol (NORMODYNE) 100 MG tablet    Sig: Take 1 tablet (100 mg total) by mouth 2 (two) times daily.    Dispense:  180 tablet    Refill:  3   enalapril (VASOTEC) 10 MG tablet    Sig: Take 1 tablet (10 mg total) by mouth daily.    Dispense:  90 tablet    Refill:  3    Problem List Items Addressed  This Visit       Unprioritized   MDS (myelodysplastic syndrome), low grade (HCC) (Chronic)    Per oncology       Controlled type 2 diabetes mellitus without complication (HCC)    Check labs  Diet controlled       Relevant Medications   lovastatin (MEVACOR) 20 MG tablet   enalapril (VASOTEC) 10 MG tablet   Diabetes mellitus, type 2 (HCC)    Check labs  con't meds       Relevant Medications   lovastatin (MEVACOR) 20 MG tablet   enalapril (VASOTEC) 10 MG tablet   Essential hypertension    Well controlled, no changes to meds. Encouraged heart healthy diet such as the DASH diet and exercise as tolerated.        Relevant Medications   NIFEdipine (PROCARDIA XL/NIFEDICAL XL) 60 MG 24 hr tablet   lovastatin (MEVACOR) 20 MG tablet   furosemide (LASIX) 20 MG tablet   labetalol (NORMODYNE) 100 MG tablet   enalapril (VASOTEC) 10 MG tablet   Other Relevant Orders   Lipid panel   Hemoglobin A1c   Comprehensive metabolic panel   TSH   Hyperlipidemia    Encourage heart healthy diet such as MIND or DASH diet, increase exercise, avoid trans fats, simple carbohydrates and processed foods,  consider a krill or fish or flaxseed oil cap daily.        Relevant Medications   NIFEdipine (PROCARDIA XL/NIFEDICAL XL) 60 MG 24 hr tablet   lovastatin (MEVACOR) 20 MG tablet   furosemide (LASIX) 20 MG tablet   labetalol (NORMODYNE) 100 MG tablet   enalapril (VASOTEC) 10 MG tablet   Other Relevant Orders   Lipid panel   Hemoglobin A1c   Comprehensive metabolic panel   TSH   Hypothyroidism    Stable Check labs        Relevant Medications   levothyroxine (SYNTHROID) 75 MCG tablet   labetalol (NORMODYNE) 100 MG tablet   Other Relevant Orders   TSH   Other Visit Diagnoses     Diet-controlled diabetes mellitus ([place])    -  Primary   Relevant Medications   lovastatin (MEVACOR) 20 MG tablet   enalapril (VASOTEC) 10 MG tablet   Other Relevant Orders   Hemoglobin A1c   Comprehensive metabolic panel       Follow-up: Return in about 6 months (around 04/17/2021), or if symptoms worsen or fail to improve, for hypertension, hyperlipidemia.   I,Gordon Zheng,acting as a Education administrator for [employer], DO.,have documented all relevant documentation on the behalf of [name], DO,as directed by  [name], DO while in the presence of [place], DO, have reviewed all documentation for this visit. The documentation on 10/15/20 for the exam, diagnosis, procedures, and orders are all accurate and complete.

## 2020-10-15 NOTE — Assessment & Plan Note (Signed)
Check labs con't meds 

## 2020-10-15 NOTE — Assessment & Plan Note (Signed)
Per oncology °

## 2020-10-19 ENCOUNTER — Encounter: Payer: Self-pay | Admitting: Family Medicine

## 2020-10-21 DIAGNOSIS — L400 Psoriasis vulgaris: Secondary | ICD-10-CM | POA: Diagnosis not present

## 2020-11-26 ENCOUNTER — Other Ambulatory Visit (HOSPITAL_BASED_OUTPATIENT_CLINIC_OR_DEPARTMENT_OTHER): Payer: Self-pay

## 2020-12-07 ENCOUNTER — Telehealth: Payer: Self-pay | Admitting: Pharmacist

## 2020-12-07 NOTE — Chronic Care Management (AMB) (Signed)
    Chronic Care Management Pharmacy Assistant   Name: Trevor Cooke  MRN: [id number] DOB: 1940/08/12  Reason for Encounter: Disease State Hypertension    Recent office visits:  10/15/20-Trevor Gibson Ramp, DO (PCP) Seen for general follow up visit. Labs ordered. Follow up in 6 months.  Recent consult visits:  None noted  Hospital visits:  None in previous 6 months  Medications: Outpatient Encounter Medications as of 12/07/2020  Medication Sig Note   aspirin 81 MG tablet Take 81 mg by mouth daily.    Bioflavonoid Products (ESTER-C) 500-200-60 MG TABS Take 500 mg by mouth daily.    calcipotriene-betamethasone (TACLONEX SCALP) external suspension Apply topically daily.    CALCITRIOL EX Apply topically daily as needed.    Calcium Carbonate (CALCIUM 600 PO) Take 600 mg by mouth daily.    Cholecalciferol (VITAMIN D) 2000 units CAPS Take 2,000 Units by mouth daily.    clobetasol cream (TEMOVATE) AB-123456789 % Apply 1 application topically as needed.    Emollient (EUCERIN) lotion Apply topically as needed for dry skin.    enalapril (VASOTEC) 10 MG tablet Take 1 tablet (10 mg total) by mouth daily.    furosemide (LASIX) 20 MG tablet Take 1 tablet (20 mg total) by mouth daily.    glucose blood (FREESTYLE PRECISION NEO TEST) test strip Check glucose 1 time daily dx:E11.9    labetalol (NORMODYNE) 100 MG tablet Take 1 tablet (100 mg total) by mouth 2 (two) times daily.    levothyroxine (SYNTHROID) 75 MCG tablet Take 1 tablet (75 mcg total) by mouth daily.    lovastatin (MEVACOR) 20 MG tablet Take 1 tablet (20 mg total) by mouth at bedtime.    Multiple Vitamins-Minerals (MULTIVITAMIN WITH MINERALS) tablet Take 1 tablet by mouth daily. CitraVit 05/03/2015: Received from: [facility] Received Sig: Take by mouth.   NIFEdipine (PROCARDIA XL/NIFEDICAL XL) 60 MG 24 hr tablet Take 1 tablet (60 mg total) by mouth daily.    omeprazole (PRILOSEC) 20 MG capsule Take 1 capsule by  mouth daily.    tadalafil (CIALIS) 20 MG tablet Take 0.5-1 tablets (10-20 mg total) by mouth every other day as needed for erectile dysfunction.    testosterone cypionate (DEPOTESTOSTERONE CYPIONATE) 200 MG/ML injection Inject 75 mg into the muscle once a week.    Zinc 50 MG CAPS Take 1 capsule by mouth daily.    No facility-administered encounter medications on file as of 12/07/2020.   Current antihypertensive regimen:  Enalapril '10mg'$  daily Labetaolol '100mg'$  twice daily Furosemide '20mg'$  daily Nifedipine XL '60mg'$  daily (fills at [facility] - no insurance used)  Unsuccessful out reach and tried to contact patient 3x to attempt assessment call. I have left 3 voicemail's as well to return my call.  Adherence Review: Is the patient currently on ACE/ARB medication? Yes Does the patient have >5 day gap between last estimated fill dates? No   Star Rating Drugs: Enalapril 10 mg Last filled:12/06/20 90 DS Lovastatin 20 mg Last filled:12/06/20 90 DS  Myriam Elta [place], [place]

## 2021-01-10 DIAGNOSIS — E291 Testicular hypofunction: Secondary | ICD-10-CM | POA: Diagnosis not present

## 2021-01-17 ENCOUNTER — Other Ambulatory Visit (HOSPITAL_BASED_OUTPATIENT_CLINIC_OR_DEPARTMENT_OTHER): Payer: Self-pay

## 2021-01-17 DIAGNOSIS — Z23 Encounter for immunization: Secondary | ICD-10-CM | POA: Diagnosis not present

## 2021-01-17 MED ORDER — INFLUENZA VAC A&B SA ADJ QUAD 0.5 ML IM PRSY
PREFILLED_SYRINGE | INTRAMUSCULAR | 0 refills | Status: DC
  Filled 2021-01-17: qty 0.5, 1d supply, fill #0

## 2021-01-25 DIAGNOSIS — L570 Actinic keratosis: Secondary | ICD-10-CM | POA: Diagnosis not present

## 2021-01-25 DIAGNOSIS — L400 Psoriasis vulgaris: Secondary | ICD-10-CM | POA: Diagnosis not present

## 2021-01-25 DIAGNOSIS — L814 Other melanin hyperpigmentation: Secondary | ICD-10-CM | POA: Diagnosis not present

## 2021-02-04 ENCOUNTER — Ambulatory Visit: Payer: Medicare Other | Attending: Internal Medicine

## 2021-02-04 ENCOUNTER — Encounter: Payer: Self-pay | Admitting: Hematology & Oncology

## 2021-02-04 ENCOUNTER — Inpatient Hospital Stay: Payer: Medicare Other | Attending: Hematology & Oncology

## 2021-02-04 ENCOUNTER — Other Ambulatory Visit: Payer: Self-pay

## 2021-02-04 ENCOUNTER — Inpatient Hospital Stay (HOSPITAL_BASED_OUTPATIENT_CLINIC_OR_DEPARTMENT_OTHER): Payer: Medicare Other | Admitting: Hematology & Oncology

## 2021-02-04 VITALS — BP 132/72 | HR 58 | Temp 97.9°F | Resp 16 | Wt 191.0 lb

## 2021-02-04 DIAGNOSIS — D469 Myelodysplastic syndrome, unspecified: Secondary | ICD-10-CM | POA: Diagnosis not present

## 2021-02-04 DIAGNOSIS — D4620 Refractory anemia with excess of blasts, unspecified: Secondary | ICD-10-CM

## 2021-02-04 DIAGNOSIS — Z23 Encounter for immunization: Secondary | ICD-10-CM

## 2021-02-04 LAB — CMP (CANCER CENTER ONLY)
ALT: 15 U/L (ref 0–44)
AST: 23 U/L (ref 15–41)
Albumin: 4.3 g/dL (ref 3.5–5.0)
Alkaline Phosphatase: 80 U/L (ref 38–126)
Anion gap: 5 (ref 5–15)
BUN: 14 mg/dL (ref 8–23)
CO2: 33 mmol/L — ABNORMAL HIGH (ref 22–32)
Calcium: 9.2 mg/dL (ref 8.9–10.3)
Chloride: 100 mmol/L (ref 98–111)
Creatinine: 1.29 mg/dL — ABNORMAL HIGH (ref 0.61–1.24)
GFR, Estimated: 56 mL/min — ABNORMAL LOW (ref >60)
Glucose, Bld: 136 mg/dL — ABNORMAL HIGH (ref 70–99)
Potassium: 4.3 mmol/L (ref 3.5–5.1)
Sodium: 138 mmol/L (ref 135–145)
Total Bilirubin: 0.9 mg/dL (ref 0.3–1.2)
Total Protein: 6.9 g/dL (ref 6.5–8.1)

## 2021-02-04 LAB — CBC WITH DIFFERENTIAL (CANCER CENTER ONLY)
Abs Immature Granulocytes: 0.02 10*3/uL (ref 0.00–0.07)
Basophils Absolute: 0 10*3/uL (ref 0.0–0.1)
Basophils Relative: 0 %
Eosinophils Absolute: 0.1 10*3/uL (ref 0.0–0.5)
Eosinophils Relative: 2 %
HCT: 41.3 % (ref 39.0–52.0)
Hemoglobin: 14.1 g/dL (ref 13.0–17.0)
Immature Granulocytes: 1 %
Lymphocytes Relative: 39 %
Lymphs Abs: 0.9 10*3/uL (ref 0.7–4.0)
MCH: 33.4 pg (ref 26.0–34.0)
MCHC: 34.1 g/dL (ref 30.0–36.0)
MCV: 97.9 fL (ref 80.0–100.0)
Monocytes Absolute: 0.1 10*3/uL (ref 0.1–1.0)
Monocytes Relative: 6 %
Neutro Abs: 1.2 10*3/uL — ABNORMAL LOW (ref 1.7–7.7)
Neutrophils Relative %: 52 %
Platelet Count: 162 10*3/uL (ref 150–400)
RBC: 4.22 MIL/uL (ref 4.22–5.81)
RDW: 13.2 % (ref 11.5–15.5)
WBC Count: 2.3 10*3/uL — ABNORMAL LOW (ref 4.0–10.5)
nRBC: 0 % (ref 0.0–0.2)

## 2021-02-04 LAB — RETICULOCYTES
Immature Retic Fract: 12.5 % (ref 2.3–15.9)
RBC.: 4.22 MIL/uL (ref 4.22–5.81)
Retic Count, Absolute: 80.2 10*3/uL (ref 19.0–186.0)
Retic Ct Pct: 1.9 % (ref 0.4–3.1)

## 2021-02-04 LAB — IRON AND TIBC
Iron: 89 ug/dL (ref 42–163)
Saturation Ratios: 29 % (ref 20–55)
TIBC: 304 ug/dL (ref 202–409)
UIBC: 215 ug/dL (ref 117–376)

## 2021-02-04 LAB — LACTATE DEHYDROGENASE: LDH: 223 U/L — ABNORMAL HIGH (ref 98–192)

## 2021-02-04 LAB — SAVE SMEAR(SSMR), FOR PROVIDER SLIDE REVIEW

## 2021-02-04 LAB — FERRITIN: Ferritin: 56 ng/mL (ref 24–336)

## 2021-02-04 NOTE — Progress Notes (Signed)
Hematology and Oncology Follow Up Visit  Trevor Cooke [id number] November 23, 1940 80 y.o. 02/04/2021   Principle Diagnosis:  Myelodysplastic syndrome-low-grade (IPSS-R score = 1) - tp53+  Current Therapy:   Observation     Interim History:  Trevor Cooke is back for follow-up.  We see him every 6 months.  As always, he travels.  He has been busy over the summertime.  He says he feels well.  He really has no specific complaints.  There is been no problems with fever.  He has had no change in bowel or bladder habits.  There is been no cough or shortness of breath.  He has had no issues with cough.   His iron studies back in June showed a ferritin of 80 with iron saturation of 32%.  Currently, I would say his performance status is ECOG 1.     Medications:  Current Outpatient Medications:    aspirin 81 MG tablet, Take 81 mg by mouth daily., Disp: , Rfl:    Bioflavonoid Products (ESTER-C) 500-200-60 MG TABS, Take 500 mg by mouth daily., Disp: , Rfl:    calcipotriene-betamethasone (TACLONEX SCALP) external suspension, Apply topically daily., Disp: , Rfl:    CALCITRIOL EX, Apply topically daily as needed., Disp: , Rfl:    Calcium Carbonate (CALCIUM 600 PO), Take 600 mg by mouth daily., Disp: , Rfl:    Cholecalciferol (VITAMIN D) 2000 units CAPS, Take 2,000 Units by mouth daily., Disp: , Rfl:    clobetasol cream (TEMOVATE) 3.50 %, Apply 1 application topically as needed., Disp: , Rfl:    Emollient (EUCERIN) lotion, Apply topically as needed for dry skin., Disp: , Rfl:    enalapril (VASOTEC) 10 MG tablet, Take 1 tablet (10 mg total) by mouth daily., Disp: 90 tablet, Rfl: 3   furosemide (LASIX) 20 MG tablet, Take 1 tablet (20 mg total) by mouth daily., Disp: 90 tablet, Rfl: 3   glucose blood (FREESTYLE PRECISION NEO TEST) test strip, Check glucose 1 time daily dx:E11.9, Disp: 100 each, Rfl: 4   influenza vaccine adjuvanted (FLUAD) 0.5 ML injection, Inject into the muscle., Disp: 0.5 mL, Rfl:  0   labetalol (NORMODYNE) 100 MG tablet, Take 1 tablet (100 mg total) by mouth 2 (two) times daily., Disp: 180 tablet, Rfl: 3   levothyroxine (SYNTHROID) 75 MCG tablet, Take 1 tablet (75 mcg total) by mouth daily., Disp: 90 tablet, Rfl: 3   lovastatin (MEVACOR) 20 MG tablet, Take 1 tablet (20 mg total) by mouth at bedtime., Disp: 90 tablet, Rfl: 3   Multiple Vitamins-Minerals (MULTIVITAMIN WITH MINERALS) tablet, Take 1 tablet by mouth daily. CitraVit, Disp: , Rfl:    NIFEdipine (PROCARDIA XL/NIFEDICAL XL) 60 MG 24 hr tablet, Take 1 tablet (60 mg total) by mouth daily., Disp: 90 tablet, Rfl: 3   omeprazole (PRILOSEC) 20 MG capsule, Take 1 capsule by mouth daily., Disp: , Rfl:    tadalafil (CIALIS) 20 MG tablet, Take 0.5-1 tablets (10-20 mg total) by mouth every other day as needed for erectile dysfunction., Disp: 5 tablet, Rfl: 11   testosterone cypionate (DEPOTESTOSTERONE CYPIONATE) 200 MG/ML injection, Inject 75 mg into the muscle once a week., Disp: , Rfl:    Zinc 50 MG CAPS, Take 1 capsule by mouth daily., Disp: , Rfl:   Allergies: No Known Allergies  Past Medical History, Surgical history, Social history, and Family History were reviewed and updated.  Review of Systems: Review of Systems  Constitutional: Negative.   HENT: Negative.    Eyes: Negative.  Respiratory: Negative.    Cardiovascular: Negative.   Gastrointestinal: Negative.   Genitourinary: Negative.   Musculoskeletal: Negative.   Skin: Negative.   Neurological: Negative.   Endo/Heme/Allergies: Negative.   Psychiatric/Behavioral: Negative.      Physical Exam:  weight is 191 lb (86.6 kg). His oral temperature is 97.9 F (36.6 C). His blood pressure is 132/72 and his pulse is 58 (abnormal). His respiration is 16 and oxygen saturation is 97%.   Wt Readings from Last 3 Encounters:  02/04/21 191 lb (86.6 kg)  10/15/20 189 lb (85.7 kg)  08/27/20 194 lb 0.6 oz (88 kg)     Physical Exam Vitals reviewed.  HENT:      Head: Normocephalic and atraumatic.  Eyes:     Pupils: Pupils are equal, round, and reactive to light.  Cardiovascular:     Rate and Rhythm: Normal rate and regular rhythm.     Heart sounds: Normal heart sounds.  Pulmonary:     Effort: Pulmonary effort is normal.     Breath sounds: Normal breath sounds.  Abdominal:     General: Bowel sounds are normal.     Palpations: Abdomen is soft.  Musculoskeletal:        General: No tenderness or deformity. Normal range of motion.     Cervical back: Normal range of motion.  Lymphadenopathy:     Cervical: No cervical adenopathy.  Skin:    General: Skin is warm and dry.     Findings: No erythema or rash.  Neurological:     Mental Status: He is alert and oriented to person, place, and time.  Psychiatric:        Behavior: Behavior normal.        Thought Content: Thought content normal.        Judgment: Judgment normal.     Lab Results  Component Value Date   WBC 2.3 (L) 02/04/2021   HGB 14.1 02/04/2021   HCT 41.3 02/04/2021   MCV 97.9 02/04/2021   PLT 162 02/04/2021     Chemistry      Component Value Date/Time   NA 138 02/04/2021 0848   NA 144 04/26/2016 1117   K 4.3 02/04/2021 0848   K 3.9 04/26/2016 1117   CL 100 02/04/2021 0848   CL 98 04/26/2016 1117   CO2 33 (H) 02/04/2021 0848   CO2 18 04/26/2016 1117   BUN 14 02/04/2021 0848   BUN 13 04/26/2016 1117   CREATININE 1.29 (H) 02/04/2021 0848   CREATININE 1.2 04/26/2016 1117      Component Value Date/Time   CALCIUM 9.2 02/04/2021 0848   CALCIUM 9.1 04/26/2016 1117   ALKPHOS 80 02/04/2021 0848   ALKPHOS 99 (H) 04/26/2016 1117   AST 23 02/04/2021 0848   ALT 15 02/04/2021 0848   ALT 22 04/26/2016 1117   BILITOT 0.9 02/04/2021 0848      Impression and Plan: Trevor Cooke is a 80 year old white male. He has myelodysplasia. This is manifested as leukopenia.   I looked at his blood under the microscope.  I did not see any immature myeloid cells.  He has no nucleated red  blood cells.  His platelets look fine and are well granulated.  He had no blasts.  From my point of view, his myelodysplasia is holding nice and steady right now.  I am happy about this.  His quality of life is doing well.  I still think we can follow him up in 6 months.  I just  would not imagine that his blood counts are going to change significantly in that time period.   Volanda Napoleon, MD 11/11/202210:11 AM

## 2021-02-04 NOTE — Progress Notes (Signed)
   Covid-19 Vaccination Clinic  Name:  Trevor Cooke    MRN: [id number] DOB: October 03, 1940  02/04/2021  [name] was observed post Covid-19 immunization for 15 minutes without incident. He was provided with Vaccine Information Sheet and instruction to access the V-Safe system.   [name] was instructed to call 911 with any severe reactions post vaccine: Difficulty breathing  Swelling of face and throat  A fast heartbeat  A bad rash all over body  Dizziness and weakness   Immunizations Administered     Name Date Dose VIS Date Route   Pfizer Covid-19 Vaccine Bivalent Booster 02/04/2021 12:41 PM 0.3 mL 11/24/2020 Intramuscular   Manufacturer: [place]   Lot: [id number]   [place]: [phone]      Given by Waverly Ferrari PharmD

## 2021-02-22 ENCOUNTER — Other Ambulatory Visit (HOSPITAL_BASED_OUTPATIENT_CLINIC_OR_DEPARTMENT_OTHER): Payer: Self-pay

## 2021-02-22 DIAGNOSIS — Z23 Encounter for immunization: Secondary | ICD-10-CM | POA: Diagnosis not present

## 2021-02-22 MED ORDER — PFIZER COVID-19 VAC BIVALENT 30 MCG/0.3ML IM SUSP
INTRAMUSCULAR | 0 refills | Status: DC
  Filled 2021-02-22: qty 0.3, 1d supply, fill #0

## 2021-04-07 ENCOUNTER — Ambulatory Visit (INDEPENDENT_AMBULATORY_CARE_PROVIDER_SITE_OTHER): Payer: Medicare Other | Admitting: Pharmacist

## 2021-04-07 DIAGNOSIS — E785 Hyperlipidemia, unspecified: Secondary | ICD-10-CM

## 2021-04-07 DIAGNOSIS — E039 Hypothyroidism, unspecified: Secondary | ICD-10-CM

## 2021-04-07 DIAGNOSIS — E119 Type 2 diabetes mellitus without complications: Secondary | ICD-10-CM

## 2021-04-07 DIAGNOSIS — I10 Essential (primary) hypertension: Secondary | ICD-10-CM

## 2021-04-07 DIAGNOSIS — K219 Gastro-esophageal reflux disease without esophagitis: Secondary | ICD-10-CM

## 2021-04-07 NOTE — Patient Instructions (Signed)
Trevor Cooke,  It was a pleasure speaking with you today.  I have attached a summary of our visit today and information about your health goals.   Our next appointment is by telephone on Aug 11, 2021 at 1:45pm  Please call the care guide team at [phone] if you need to cancel or reschedule your appointment.    Patient Goals/Self-Care Activities Over the next 90 days, patient will:  Take medications as prescribed check blood pressure daily, document, and provide at future appointments target a minimum of 150 minutes of moderate intensity exercise weekly Bring blood pressure records and blood pressure cuff to appointment with PCP 04/19/2020 to check accuracy of home monitor.   If you have any questions or concerns, please feel free to contact me either at the phone number below or with a MyChart message.   Keep up the good work!  Cherre Robins, PharmD Clinical Pharmacist [facility] [phone] (direct line)  [phone] (main office number)   CARE PLAN ENTRY (see longitudinal plan of care for additional care plan information)  Current Barriers:  Chronic Disease Management support, education, and care coordination needs related to Hypertension, Hyperlipidemia/Hx of Stroke, Diabetes, Hypothyroidism, Osteopenia, Hypogonadism, GERD, Psoriasis   Hypertension BP Readings from Last 3 Encounters:  02/04/21 132/72  10/15/20 140/70  08/27/20 135/69   Pharmacist Clinical Goal(s): Over the next 90 days, patient will work with PharmD and providers to maintain BP goal <140/90 Current regimen:  Enalapril 10mg  daily Labetaolol 100mg  twice daily Furosemide 20mg  daily Nifedipine XL 60mg  daily Interventions: Recommended patient bring blood pressure records and blood pressure cuff to appointment with PCP 04/19/2020 to check accuracy of home monitor.  Patient self care activities - Over the next 90 days, patient will: Maintain hypertension medication  regimen.  Bring blood pressure records and blood pressure cuff to appointment with PCP 04/19/2020 to check accuracy of home monitor.  Hyperlipidemia Lab Results  Component Value Date/Time   LDLCALC 51 10/15/2020 09:30 AM   Pharmacist Clinical Goal(s): Over the next 90 days, patient will work with PharmD and providers to achieve LDL goal < 70 Current regimen:  Lovastatin 20mg  daily Interventions: Discussed exercise goal of walking 3 days a week for 30 minutes Patient self care activities - Over the next 90 days, patient will: Maintain hypertension medication regimen.  Increase exercise as able to goal of 150 minutes per week of physical activity like walking  Follow heart healthy diet - limiting intake of sodium and high fat foods  Diabetes Lab Results  Component Value Date/Time   HGBA1C 6.1 10/15/2020 09:30 AM   HGBA1C 5.9 03/09/2020 10:07 AM   Pharmacist Clinical Goal(s): Over the next 90 days, patient will work with PharmD and providers to maintain A1c goal <7% Current regimen:  Diet and exercise management   Interventions: Discussed diet and exercise Patient self care activities - Over the next 90 days, patient will: Maintain a1c <7%  Osteopenia Pharmacist Clinical Goal(s) Over the next 90 days, patient will work with PharmD and providers to reduce risk of fracture due to osteopenia Current regimen:  Calcium 600mg  daily Vitamin D 2000 units daily Interventions: Discussed indication for bisphosphonate therapy noting risk of hip fracture greater than 3% Discussed intake of 1200mg  of calcium daily through diet and/or supplementation Discussed intake of [phone] units of vitamin D through supplementation  Patient self care activities - Over the next 90 days, patient will: Ensure intake of 1200mg  of calcium daily from combination of supplementation  and diet intake  Medication management Pharmacist Clinical Goal(s): Over the next 90 days, patient will work with PharmD  and providers to maintain optimal medication adherence [facility] Interventions Comprehensive medication review performed. Continue current medication management strategy Patient self care activities - Over the next 90 days, patient will: Focus on medication adherence by filling and taking medications appropriately  Take medications as prescribed Report any questions or concerns to PharmD and/or provider(s)  Health Maintenance:  Reviewed vaccination history and discussed benefits of updated Tetanus vaccination Reminded patient that eye exam is due - has appointment 05/2021 Patient to get the following vaccines at next provider appointment, local pharmacy or [place]: Tetanus   Patient Goals/Self-Care Activities Over the next 90 days, patient will:  Take medications as prescribed check blood pressure daily, document, and provide at future appointments target a minimum of 150 minutes of moderate intensity exercise weekly Recommended patient bring blood pressure records and blood pressure cuff to appointment with PCP 04/19/2020 to check accuracy of home monitor.   Patient verbalizes understanding of instructions and care plan provided today and agrees to view in [place]. Active MyChart status confirmed with patient.

## 2021-04-07 NOTE — Chronic Care Management (AMB) (Signed)
Chronic Care Management Pharmacy Note  04/07/2021 Name:  Trevor Cooke MRN:  [id number] DOB:  [birth year]   Subjective: Trevor Cooke is an 81 y.o. year old male who is a primary patient of [name], DO.  The CCM team was consulted for assistance with disease management and care coordination needs.    Engaged with patient by telephone for follow up visit in response to provider referral for pharmacy case management and/or care coordination services.   Consent to Services:  The patient was given information about Chronic Care Management services, agreed to services, and gave verbal consent prior to initiation of services.  Please see initial visit note for detailed documentation.   Patient Care Team: Carollee Herter, Alferd Apa, DO as PCP - General (Family Medicine) Eduard Roux, MD (Gastroenterology) Renita Papa, MD as Referring Physician (Dermatology) Paulla Dolly Tamala Fothergill, DPM as Consulting Physician (Podiatry) Marin Olp, Rudell Cobb, MD as Consulting Physician (Oncology) Robbie Louis, MD as Referring Physician (Urology) Cherre Robins, RPH-CPP (Pharmacist)  Recent office visits: 10/15/2020 - PCP (Dr Carollee Herter)   Recent consult visits: 02/04/2021 - Hem/Onc (Dr Marin Olp) F/U melodysplastic syndrome. Labs checked - stable. F/U 6 months. 01/10/2021 - Urology (Dr Jenetta Loges - [facility]) F/U hypogonadism. Recommended trial of 2.44m Cialis instead of 529mdue to HA with 43m61mChecked lab CBC and PSA due to testosteone therapy.    Hospital visits: None in previous 6 months  Objective:  Lab Results  Component Value Date   CREATININE 1.29 (H) 02/04/2021   CREATININE 1.15 10/15/2020   CREATININE 1.16 08/27/2020    Lab Results  Component Value Date   HGBA1C 6.1 10/15/2020   Last diabetic Eye exam:  Lab Results  Component Value Date/Time   HMDIABEYEEXA No Retinopathy 12/26/2014 12:00 AM    Last diabetic Foot exam: No results found for: HMDIABFOOTEX       Component Value Date/Time   CHOL 92 10/15/2020 0930   TRIG 54.0 10/15/2020 0930   TRIG 74 04/03/2006 1152   HDL 30.20 (L) 10/15/2020 0930   CHOLHDL 3 10/15/2020 0930   VLDL 10.8 10/15/2020 0930   LDLCALC 51 10/15/2020 0930    Hepatic Function Latest Ref Rng & Units 02/04/2021 10/15/2020 08/27/2020  Total Protein 6.5 - 8.1 g/dL 6.9 6.8 6.7  Albumin 3.5 - 5.0 g/dL 4.3 4.2 4.1  AST 15 - 41 U/L 23 23 27   ALT 0 - 44 U/L 15 14 14   Alk Phosphatase 38 - 126 U/L 80 77 74  Total Bilirubin 0.3 - 1.2 mg/dL 0.9 0.9 0.8  Bilirubin, Direct 0.0 - 0.3 mg/dL - - -    Lab Results  Component Value Date/Time   TSH 3.29 10/15/2020 09:30 AM   TSH 4.38 03/09/2020 10:07 AM   FREET4 0.71 12/28/2015 08:55 AM   FREET4 0.77 09/18/2012 11:07 AM    CBC Latest Ref Rng & Units 02/04/2021 08/27/2020 03/09/2020  WBC 4.0 - 10.5 K/uL 2.3(L) 2.3(L) 2.4(L)  Hemoglobin 13.0 - 17.0 g/dL 14.1 13.1 13.9  Hematocrit 39.0 - 52.0 % 41.3 38.2(L) 40.9  Platelets 150 - 400 K/uL 162 162 163.0    No results found for: VD25OH  Clinical ASCVD: No  The ASCVD Risk score (Arnett DK, et al., 2019) failed to calculate for the following reasons:   The 2019 ASCVD risk score is only valid for ages 40 55 79 6  Social History   Tobacco Use  Smoking Status Former  Smokeless Tobacco Never  Tobacco Comments  quit 1970   BP Readings from Last 3 Encounters:  02/04/21 132/72  10/15/20 140/70  08/27/20 135/69   Pulse Readings from Last 3 Encounters:  02/04/21 (!) 58  10/15/20 (!) 53  08/27/20 (!) 56   Wt Readings from Last 3 Encounters:  02/04/21 191 lb (86.6 kg)  10/15/20 189 lb (85.7 kg)  08/27/20 194 lb 0.6 oz (88 kg)    Assessment: Review of patient past medical history, allergies, medications, health status, including review of consultants reports, laboratory and other test data, was performed as part of comprehensive evaluation and provision of chronic care management services.   SDOH:  (Social Determinants of  Health) assessments and interventions performed:  SDOH Interventions    Flowsheet Row Most Recent Value  SDOH Interventions   Financial Strain Interventions Intervention Not Indicated  Physical Activity Interventions Other (Comments)  [active at home and in yard]  Transportation Interventions Intervention Not Indicated       CCM Care Plan  No Known Allergies  Medications Reviewed Today     Reviewed by Cherre Robins, RPH-CPP (Pharmacist) on 04/07/21 at 69  Med List Status: <None>   Medication Order Taking? Sig Documenting Provider Last Dose Status Informant  aspirin 81 MG tablet [id number] Yes Take 81 mg by mouth daily. [provider] Taking Active   calcipotriene-betamethasone [facility] SCALP) external suspension [id number] Yes Apply topically daily. [provider] Taking Active Self  CALCITRIOL EX [id number] Yes Apply topically daily as needed. [provider] Taking Active Self  Calcium Carbonate (CALCIUM 600 PO) [id number] No Take 600 mg by mouth daily.  Patient not taking: Reported on 04/07/2021   [provider] Not Taking Active   Cholecalciferol (VITAMIN D) 2000 units CAPS [id number] Yes Take 2,000 Units by mouth daily. [provider] Taking Active   clobetasol cream (TEMOVATE) 0.05 % [id number] Yes Apply 1 application topically as needed. [provider] Taking Active   Emollient (EUCERIN) lotion [id number] Yes Apply topically as needed for dry skin. [provider] Taking Active Self  enalapril (VASOTEC) 10 MG tablet [id number] Yes Take 1 tablet (10 mg total) by mouth daily.  Patient taking differently: Take 10 mg by mouth daily. Takes at night   Carollee Herter, Alferd Apa, DO Taking Active   furosemide (LASIX) 20 MG tablet [id number] Yes Take 1 tablet (20 mg total) by mouth daily. Roma Schanz R, DO Taking Active   glucose blood (FREESTYLE PRECISION NEO TEST) test strip [id number] Yes Check glucose 1 time daily  dx:E11.9 Carollee Herter, Alferd Apa, DO Taking Active   labetalol (NORMODYNE) 100 MG tablet [id number] Yes Take 1 tablet (100 mg total) by mouth 2 (two) times daily. [name], DO Taking Active   levothyroxine (SYNTHROID) 75 MCG tablet [id number] Yes Take 1 tablet (75 mcg total) by mouth daily. [name], DO Taking Active   lovastatin (MEVACOR) 20 MG tablet [id number] Yes Take 1 tablet (20 mg total) by mouth at bedtime. [name], DO Taking Active   Multiple Vitamins-Minerals (MULTIVITAMIN WITH MINERALS) tablet [id number] Yes Take 1 tablet by mouth daily. CitraVit [provider] Taking Active            Med Note Evonnie Pat May 03, 2015 10:04 AM) Received from: [facility] Received Sig: Take by mouth.  NIFEdipine (PROCARDIA XL/NIFEDICAL XL) 60 MG 24 hr tablet [id number] Yes Take 1 tablet (60 mg total) by mouth daily. Carollee Herter, Kendrick Fries  R, DO Taking Active   omeprazole (PRILOSEC) 20 MG capsule [id number] Yes Take 1 capsule by mouth daily. [provider] Taking Active   tadalafil (CIALIS) 20 MG tablet [id number] Yes Take 0.5-1 tablets (10-20 mg total) by mouth every other day as needed for erectile dysfunction. [name], DO Taking Active   testosterone cypionate (DEPOTESTOSTERONE CYPIONATE) 200 MG/ML injection [id number] Yes Inject 75 mg into the muscle once a week. [provider] Taking Active Self  Zinc 50 MG CAPS [id number] Yes Take 1 capsule by mouth daily. [provider] Taking Active             Patient Active Problem List   Diagnosis Date Noted   MDS (myelodysplastic syndrome), low grade ([place]) 04/26/2016   Arthritis 08/01/2011   Routine health maintenance 08/01/2011   PSORIASIS 05/31/2010   CHEST WALL PAIN, ACUTE 04/12/2010   Hypogonadism male 02/12/2007   Hypothyroidism 10/06/2006   Diabetes mellitus, type 2 ([place]) 10/06/2006   Hyperlipidemia 10/06/2006   Essential  hypertension 10/06/2006   GERD 10/06/2006   Controlled type 2 diabetes mellitus without complication ([place]) 76/19/5093    Immunization History  Administered Date(s) Administered   Fluad Quad(high Dose 65+) 01/02/2019, 01/08/2020, 01/17/2021   H1N1 04/21/2008   Influenza Split 01/30/2012   Influenza Whole 12/28/2003, 01/02/2008, 12/29/2009   Influenza, High Dose Seasonal PF 01/27/2013, 01/30/2014, 01/28/2015, 01/27/2016, 01/25/2017, 01/23/2018   PFIZER Comirnaty(Gray Top)Covid-19 Tri-Sucrose Vaccine 04/19/2019, 05/10/2019, 08/12/2020   PFIZER(Purple Top)SARS-COV-2 Vaccination 04/19/2019, 05/10/2019, 01/29/2020   Pfizer Covid-19 Vaccine Bivalent Booster 25yr & up 02/04/2021   Pneumococcal Conjugate-13 04/07/2013   Pneumococcal Polysaccharide-23 04/12/2010, 09/15/2019   Td 09/25/1995   Tdap 10/21/2010   Zoster Recombinat (Shingrix) 11/12/2017, 02/07/2018   Zoster, Live 01/30/2012    Conditions to be addressed/monitored: HTN, HLD, DMII, and osteopenia  Care Plan : General Pharmacy (Adult)  Updates made by ECherre Robins RPH-CPP since 04/07/2021 12:00 AM     Problem: HTN, HLD, DM, Hypothyroidism   Priority: High  Onset Date: 06/08/2020     Long-Range Goal: Patient-Specific Goal   Start Date: 06/07/2020  Expected End Date: 12/09/2020  Recent Progress: On track  Priority: High  Note:   Current Barriers:  No barriers identified at today's visit.   Pharmacist Clinical Goal(s):  Over the next 180 days, patient will achieve adherence to monitoring guidelines and medication adherence to achieve therapeutic efficacy maintain control of blood pressure and glucose as evidenced by maintaining goals listed below adhere to prescribed medication regimen as evidenced by filld ates through collaboration with PharmD and provider.   Interventions: 1:1 collaboration with LCarollee Herter YAlferd Apa DO regarding development and update of comprehensive plan of care as evidenced by provider attestation  and co-signature Inter-disciplinary care team collaboration (see longitudinal plan of care) Comprehensive medication review performed; medication list updated in electronic medical record  Hypertension (BP goal <140/90) Controlled per office blood pressure reading but recent home blood pressure readings have been slightly above goal Current treatment: Enalapril 175mdaily Labetaolol 10045mwice daily Furosemide 71m11mily Nifedipine XL 60mg13mly (fills at [employer] insurance used) Medications previously tried: none noted  Current home reading: 147/74 HR; 58 Denies hypotensive/hypertensive symptoms Interventions:  Reviewed BP goals and benefits of medications for prevention of heart attack, stroke and kidney damage; Recommended continue to monitor BP at home daily, document, and provide log at future appointments Recommended patient bring blood pressure records and blood pressure cuff to appointment with PCP 04/19/2020 to check accuracy  of home monitor.  Continue to walk for exercise; Discussed exercise goal of 150 minutes per week; Recommended to continue current medication  Hyperlipidemia: (LDL goal < 70) Controlled Current treatment: Lovastatin 49m daily Medications previously tried: none noted Exercise:  walking for 35 minutes 3 days per week and yardwork.  Interventions:  Educated on Cholesterol goals;  Discussed benefits of statin for ASCVD risk reduction; \ Recommended to continue current medication  Diabetes (A1c goal <7%) Controlled Current medications: None Medications previously tried: none noted  Current home glucose readings: checked once weekly - usually 90 to 130 Report no BG <80 or over 150 Denies hypoglycemic/hyperglycemic symptoms Current meal patterns: see above Current exercise: see above Interventions:  Reviewed A1c goal Reviewed home blood glucose readings and reviewed goals  Fasting blood glucose goal (before meals) = 80 to 130 Blood glucose  goal after a meal = less than 180  Discussed exercise goal of 150 minutes per week;  Counseled to check feet daily and get yearly eye exams Recommended continue current management strategies  Osteopenia (Goal Prevent fractures) Controlled Last DEXA Scan: 12/02/19             T-Score femoral neck: -2.2             T-Score total hip: -1.2             T-Score lumbar spine: -0.7             10-year probability of major osteoporotic fracture: 11%             10-year probability of hip fracture: 5.1% Patient is a candidate for pharmacologic treatment due to T-Score -1.0 to -2.5 and 10-year risk of hip fracture > 3% Of note patient mentions he has had swallowing trouble in the past and will have EGD 10/11/2020. Take into consideration if plan to start pharmacotherpay in future.  Current treatment  Calcium 603mdaily Vitamin D 2000 units daily Medications previously tried: none noted Interventions (addressed at previous visits)  Recommend [phone] units of vitamin D daily. Recommend 1200 mg of calcium daily from dietary and supplemental sources.  Recommended to continue current supplementation  Health Maintenance:  Reviewed vaccination history and discussed benefits of updated Tetanus vaccination Reminded patient that eye exam is due - has appointment 05/2021 Patient to get the following vaccines at next provider appointment, local pharmacy or [place]   Patient Goals/Self-Care Activities Over the next 90 days, patient will:  Take medications as prescribed check blood pressure daily, document, and provide at future appointments target a minimum of 150 minutes of moderate intensity exercise weekly Recommended patient bring blood pressure records and blood pressure cuff to appointment with PCP 04/19/2020 to check accuracy of home monitor.   Follow Up Plan: The care management team will reach out to the patient again over the next 120 days.       Medication  Assistance: None required.  Patient affirms current coverage meets needs.  [facility]  [id number]  [address]: [phone]: [address] [place]one: [phone]: [phone] [place] Registered Caremark Sites [address] [place]one: [phone]: [phone] Up:  Patient agrees to Care Plan and Follow-up.  Plan: Telephone follow up appointment with care management team member scheduled for:  08/11/2021  Cherre Robins, PharmD Clinical Pharmacist [place] [facility]

## 2021-04-19 ENCOUNTER — Ambulatory Visit (INDEPENDENT_AMBULATORY_CARE_PROVIDER_SITE_OTHER): Payer: Medicare Other | Admitting: Family Medicine

## 2021-04-19 ENCOUNTER — Encounter: Payer: Self-pay | Admitting: Family Medicine

## 2021-04-19 VITALS — BP 128/88 | HR 56 | Temp 97.8°F | Resp 18 | Ht 68.0 in | Wt 192.4 lb

## 2021-04-19 DIAGNOSIS — E039 Hypothyroidism, unspecified: Secondary | ICD-10-CM | POA: Diagnosis not present

## 2021-04-19 DIAGNOSIS — I10 Essential (primary) hypertension: Secondary | ICD-10-CM

## 2021-04-19 DIAGNOSIS — E291 Testicular hypofunction: Secondary | ICD-10-CM | POA: Diagnosis not present

## 2021-04-19 DIAGNOSIS — E119 Type 2 diabetes mellitus without complications: Secondary | ICD-10-CM | POA: Diagnosis not present

## 2021-04-19 DIAGNOSIS — D708 Other neutropenia: Secondary | ICD-10-CM

## 2021-04-19 DIAGNOSIS — D4620 Refractory anemia with excess of blasts, unspecified: Secondary | ICD-10-CM | POA: Diagnosis not present

## 2021-04-19 DIAGNOSIS — E785 Hyperlipidemia, unspecified: Secondary | ICD-10-CM

## 2021-04-19 DIAGNOSIS — E1151 Type 2 diabetes mellitus with diabetic peripheral angiopathy without gangrene: Secondary | ICD-10-CM | POA: Insufficient documentation

## 2021-04-19 LAB — COMPREHENSIVE METABOLIC PANEL
ALT: 15 U/L (ref 0–53)
AST: 21 U/L (ref 0–37)
Albumin: 4.3 g/dL (ref 3.5–5.2)
Alkaline Phosphatase: 70 U/L (ref 39–117)
BUN: 14 mg/dL (ref 6–23)
CO2: 34 mEq/L — ABNORMAL HIGH (ref 19–32)
Calcium: 8.9 mg/dL (ref 8.4–10.5)
Chloride: 99 mEq/L (ref 96–112)
Creatinine, Ser: 1.12 mg/dL (ref 0.40–1.50)
GFR: 61.35 mL/min (ref >60.00)
Glucose, Bld: 112 mg/dL — ABNORMAL HIGH (ref 70–99)
Potassium: 4.1 mEq/L (ref 3.5–5.1)
Sodium: 138 mEq/L (ref 135–145)
Total Bilirubin: 0.8 mg/dL (ref 0.2–1.2)
Total Protein: 7 g/dL (ref 6.0–8.3)

## 2021-04-19 LAB — LIPID PANEL
Cholesterol: 118 mg/dL (ref 0–200)
HDL: 30.3 mg/dL — ABNORMAL LOW (ref >39.00)
LDL Cholesterol: 74 mg/dL (ref 0–99)
NonHDL: 87.89
Total CHOL/HDL Ratio: 4
Triglycerides: 70 mg/dL (ref 0.0–149.0)
VLDL: 14 mg/dL (ref 0.0–40.0)

## 2021-04-19 LAB — CBC WITH DIFFERENTIAL/PLATELET
Basophils Absolute: 0 10*3/uL (ref 0.0–0.1)
Basophils Relative: 0.4 % (ref 0.0–3.0)
Eosinophils Absolute: 0.1 10*3/uL (ref 0.0–0.7)
Eosinophils Relative: 2.7 % (ref 0.0–5.0)
HCT: 42.3 % (ref 39.0–52.0)
Hemoglobin: 14.2 g/dL (ref 13.0–17.0)
Lymphocytes Relative: 44 % (ref 12.0–46.0)
Lymphs Abs: 0.9 10*3/uL (ref 0.7–4.0)
MCHC: 33.6 g/dL (ref 30.0–36.0)
MCV: 98.5 fl (ref 78.0–100.0)
Monocytes Absolute: 0.1 10*3/uL (ref 0.1–1.0)
Monocytes Relative: 4.9 % (ref 3.0–12.0)
Neutro Abs: 1 10*3/uL — ABNORMAL LOW (ref 1.4–7.7)
Neutrophils Relative %: 48 % (ref 43.0–77.0)
Platelets: 156 10*3/uL (ref 150.0–400.0)
RBC: 4.29 Mil/uL (ref 4.22–5.81)
RDW: 14.6 % (ref 11.5–15.5)
WBC: 2 10*3/uL — ABNORMAL LOW (ref 4.0–10.5)

## 2021-04-19 LAB — HEMOGLOBIN A1C: Hgb A1c MFr Bld: 6.3 % (ref 4.6–6.5)

## 2021-04-19 LAB — TSH: TSH: 4.02 u[IU]/mL (ref 0.35–5.50)

## 2021-04-19 MED ORDER — FREESTYLE PRECISION NEO TEST VI STRP: ORAL_STRIP | 4 refills | Status: DC

## 2021-04-19 NOTE — Assessment & Plan Note (Signed)
Encourage heart healthy diet such as MIND or DASH diet, increase exercise, avoid trans fats, simple carbohydrates and processed foods, consider a krill or fish or flaxseed oil cap daily.  °

## 2021-04-19 NOTE — Assessment & Plan Note (Signed)
Per u rology 

## 2021-04-19 NOTE — Assessment & Plan Note (Signed)
Per heme-onc. ?

## 2021-04-19 NOTE — Progress Notes (Signed)
Established Patient Office Visit  Subjective:  Patient ID: Trevor Cooke, male    DOB: [birth year]  Age: 81 y.o. MRN: [id number]  CC:  Chief Complaint  Patient presents with   Hypertension   Hyperlipidemia   Follow-up    HPI Trevor Cooke presents for f/u thyroid, bp and cholesterol.   Overall he is doing well .  No complaints   Past Medical History:  Diagnosis Date   Arthritis    in neck   CVA (cerebral infarction) 1996   GERD (gastroesophageal reflux disease)    History of prediabetes    Hyperlipidemia    Hypertension    MDS (myelodysplastic syndrome), low grade ([place]) 04/26/2016   Neutropenia ([place])    Psoriasis    Thyroid disease     Past Surgical History:  Procedure Laterality Date   CLEFT PALATE REPAIR  1940's   left     Family History  Problem Relation Age of Onset   Hypertension Mother    Hypertension Father    Stroke Father        died from it    Diabetes Sister    Cancer Maternal Uncle        all 4 had cancer. One was brain cancer   Diabetes Maternal Uncle    Colon cancer Neg Hx    Thyroid disease Neg Hx     Social History   Socioeconomic History   Marital status: Married    Spouse name: Not on file   Number of children: 3   Years of education: Not on file   Highest education level: Not on file  Occupational History   Not on file  Tobacco Use   Smoking status: Former   Smokeless tobacco: Never   Tobacco comments:    quit 1970  Vaping Use   Vaping Use: Never used  Substance and Sexual Activity   Alcohol use: Yes    Alcohol/week: 0.0 standard drinks    Comment: occasional wine   Drug use: No   Sexual activity: Never  Other Topics Concern   Not on file  Social History Narrative   Not on file   Social Determinants of Health   Financial Resource Strain: Low Risk    Difficulty of Paying Living Expenses: Not hard at all  Food Insecurity: Not on file  Transportation Needs: No Transportation Needs   Lack of Transportation  (Medical): No   Lack of Transportation (Non-Medical): No  Physical Activity: Insufficiently Active   Days of Exercise per Week: 3 days   Minutes of Exercise per Session: 30 min  Stress: Not on file  Social Connections: Not on file  Intimate Partner Violence: Not on file    Outpatient Medications Prior to Visit  Medication Sig Dispense Refill   aspirin 81 MG tablet Take 81 mg by mouth daily.     calcipotriene-betamethasone (TACLONEX SCALP) external suspension Apply topically daily.     CALCITRIOL EX Apply topically daily as needed.     Cholecalciferol (VITAMIN D) 2000 units CAPS Take 2,000 Units by mouth daily.     clobetasol cream (TEMOVATE) 9.73 % Apply 1 application topically as needed.     Emollient (EUCERIN) lotion Apply topically as needed for dry skin.     enalapril (VASOTEC) 10 MG tablet Take 1 tablet (10 mg total) by mouth daily. (Patient taking differently: Take 10 mg by mouth daily. Takes at night) 90 tablet 3   furosemide (LASIX) 20 MG tablet Take 1 tablet (20  mg total) by mouth daily. 90 tablet 3   labetalol (NORMODYNE) 100 MG tablet Take 1 tablet (100 mg total) by mouth 2 (two) times daily. 180 tablet 3   levothyroxine (SYNTHROID) 75 MCG tablet Take 1 tablet (75 mcg total) by mouth daily. 90 tablet 3   lovastatin (MEVACOR) 20 MG tablet Take 1 tablet (20 mg total) by mouth at bedtime. 90 tablet 3   Multiple Vitamins-Minerals (MULTIVITAMIN WITH MINERALS) tablet Take 1 tablet by mouth daily. CitraVit     NIFEdipine (PROCARDIA XL/NIFEDICAL XL) 60 MG 24 hr tablet Take 1 tablet (60 mg total) by mouth daily. 90 tablet 3   omeprazole (PRILOSEC) 20 MG capsule Take 1 capsule by mouth daily.     tadalafil (CIALIS) 20 MG tablet Take 0.5-1 tablets (10-20 mg total) by mouth every other day as needed for erectile dysfunction. 5 tablet 11   testosterone cypionate (DEPOTESTOSTERONE CYPIONATE) 200 MG/ML injection Inject 75 mg into the muscle once a week.     Zinc 50 MG CAPS Take 1 capsule by  mouth daily.     glucose blood (FREESTYLE PRECISION NEO TEST) test strip Check glucose 1 time daily dx:E11.9 100 each 4   Calcium Carbonate (CALCIUM 600 PO) Take 600 mg by mouth daily. (Patient not taking: Reported on 04/07/2021)     No facility-administered medications prior to visit.    No Known Allergies  ROS Review of Systems  Constitutional:  Negative for chills and fever.  HENT:  Negative for congestion and hearing loss.   Eyes:  Negative for discharge.  Respiratory:  Negative for cough and shortness of breath.   Cardiovascular:  Negative for chest pain, palpitations and leg swelling.  Gastrointestinal:  Negative for abdominal pain, blood in stool, constipation, diarrhea, nausea and vomiting.  Genitourinary:  Negative for dysuria, frequency, hematuria and urgency.  Musculoskeletal:  Negative for back pain and myalgias.  Skin:  Negative for rash.  Allergic/Immunologic: Negative for environmental allergies.  Neurological:  Negative for dizziness, weakness and headaches.  Hematological:  Does not bruise/bleed easily.  Psychiatric/Behavioral:  Negative for suicidal ideas. The patient is not nervous/anxious.      Objective:    Physical Exam Vitals and nursing note reviewed.  Constitutional:      Appearance: He is well-developed.  HENT:     Head: Normocephalic and atraumatic.  Eyes:     Pupils: Pupils are equal, round, and reactive to light.  Neck:     Thyroid: No thyromegaly.  Cardiovascular:     Rate and Rhythm: Normal rate and regular rhythm.     Heart sounds: No murmur heard. Pulmonary:     Effort: Pulmonary effort is normal. No respiratory distress.     Breath sounds: Normal breath sounds. No wheezing or rales.  Chest:     Chest wall: No tenderness.  Musculoskeletal:        General: No tenderness.     Cervical back: Normal range of motion and neck supple.  Skin:    General: Skin is warm and dry.  Neurological:     General: No focal deficit present.     Mental  Status: He is alert and oriented to person, place, and time.  Psychiatric:        Behavior: Behavior normal.        Thought Content: Thought content normal.        Judgment: Judgment normal.   BP 128/88 (BP Location: Left Arm, Patient Position: Sitting, Cuff Size: Normal)    Pulse Marland Kitchen)  56    Temp 97.8 F (36.6 C) (Oral)    Resp 18    Ht 5\' 8"  (1.727 m)    Wt 192 lb 6.4 oz (87.3 kg)    SpO2 96%    BMI 29.25 kg/m  Wt Readings from Last 3 Encounters:  04/19/21 192 lb 6.4 oz (87.3 kg)  02/04/21 191 lb (86.6 kg)  10/15/20 189 lb (85.7 kg)     Health Maintenance Due  Topic Date Due   TETANUS/TDAP  10/20/2020   OPHTHALMOLOGY EXAM  02/10/2021   HEMOGLOBIN A1C  04/17/2021    There are no preventive care reminders to display for this patient.  Lab Results  Component Value Date   TSH 3.29 10/15/2020   Lab Results  Component Value Date   WBC 2.3 (L) 02/04/2021   HGB 14.1 02/04/2021   HCT 41.3 02/04/2021   MCV 97.9 02/04/2021   PLT 162 02/04/2021   Lab Results  Component Value Date   NA 138 02/04/2021   K 4.3 02/04/2021   CO2 33 (H) 02/04/2021   GLUCOSE 136 (H) 02/04/2021   BUN 14 02/04/2021   CREATININE 1.29 (H) 02/04/2021   BILITOT 0.9 02/04/2021   ALKPHOS 80 02/04/2021   AST 23 02/04/2021   ALT 15 02/04/2021   PROT 6.9 02/04/2021   ALBUMIN 4.3 02/04/2021   CALCIUM 9.2 02/04/2021   ANIONGAP 5 02/04/2021   GFR 59.65 (L) 10/15/2020   Lab Results  Component Value Date   CHOL 92 10/15/2020   Lab Results  Component Value Date   HDL 30.20 (L) 10/15/2020   Lab Results  Component Value Date   LDLCALC 51 10/15/2020   Lab Results  Component Value Date   TRIG 54.0 10/15/2020   Lab Results  Component Value Date   CHOLHDL 3 10/15/2020   Lab Results  Component Value Date   HGBA1C 6.1 10/15/2020      Assessment & Plan:   Problem List Items Addressed This Visit       Unprioritized   MDS (myelodysplastic syndrome), low grade ([facility]) (Chronic)    Per heme/  onc      Diet-controlled diabetes mellitus ([place])    Check labs today hgba1c to be checked , minimize simple carbs. Increase exercise as tolerated. Continue current meds       Relevant Medications   glucose blood (FREESTYLE PRECISION NEO TEST) test strip   Other Relevant Orders   Hemoglobin A1c   Hyperlipidemia    Encourage heart healthy diet such as MIND or DASH diet, increase exercise, avoid trans fats, simple carbohydrates and processed foods, consider a krill or fish or flaxseed oil cap daily.       Relevant Orders   Comprehensive metabolic panel   Lipid panel   Hypogonadism male    Per urology      Hypothyroidism    Check labs today con't synthroid      Relevant Orders   TSH   Other neutropenia ([place])    Per heme onc      Primary hypertension - Primary    Well controlled, no changes to meds. Encouraged heart healthy diet such as the DASH diet and exercise as tolerated.       Relevant Orders   CBC with Differential/Platelet    Meds ordered this encounter  Medications   glucose blood (FREESTYLE PRECISION NEO TEST) test strip    Sig: Check glucose 1 time daily dx:E11.9    Dispense:  100 each  Refill:  4    Follow-up: Return in about 6 months (around 10/17/2021), or if symptoms worsen or fail to improve, for annual exam, fasting.    [name], DO

## 2021-04-19 NOTE — Patient Instructions (Signed)

## 2021-04-19 NOTE — Assessment & Plan Note (Signed)
Check labs today [id number] to be checked , minimize simple carbs. Increase exercise as tolerated. Continue current meds  

## 2021-04-19 NOTE — Assessment & Plan Note (Signed)
Well controlled, no changes to meds. Encouraged heart healthy diet such as the DASH diet and exercise as tolerated.  °

## 2021-04-19 NOTE — Assessment & Plan Note (Signed)
Check labs today con't synthroid

## 2021-04-26 DIAGNOSIS — I10 Essential (primary) hypertension: Secondary | ICD-10-CM | POA: Diagnosis not present

## 2021-04-26 DIAGNOSIS — E119 Type 2 diabetes mellitus without complications: Secondary | ICD-10-CM

## 2021-04-26 DIAGNOSIS — E785 Hyperlipidemia, unspecified: Secondary | ICD-10-CM | POA: Diagnosis not present

## 2021-07-11 DIAGNOSIS — E291 Testicular hypofunction: Secondary | ICD-10-CM | POA: Diagnosis not present

## 2021-07-11 DIAGNOSIS — Z125 Encounter for screening for malignant neoplasm of prostate: Secondary | ICD-10-CM | POA: Diagnosis not present

## 2021-07-15 DIAGNOSIS — H52223 Regular astigmatism, bilateral: Secondary | ICD-10-CM | POA: Diagnosis not present

## 2021-07-15 DIAGNOSIS — E119 Type 2 diabetes mellitus without complications: Secondary | ICD-10-CM | POA: Diagnosis not present

## 2021-07-15 DIAGNOSIS — H524 Presbyopia: Secondary | ICD-10-CM | POA: Diagnosis not present

## 2021-07-15 DIAGNOSIS — H2513 Age-related nuclear cataract, bilateral: Secondary | ICD-10-CM | POA: Diagnosis not present

## 2021-07-15 DIAGNOSIS — H5213 Myopia, bilateral: Secondary | ICD-10-CM | POA: Diagnosis not present

## 2021-07-16 ENCOUNTER — Other Ambulatory Visit: Payer: Self-pay

## 2021-07-16 ENCOUNTER — Emergency Department (HOSPITAL_BASED_OUTPATIENT_CLINIC_OR_DEPARTMENT_OTHER): Payer: Medicare Other

## 2021-07-16 ENCOUNTER — Telehealth (HOSPITAL_BASED_OUTPATIENT_CLINIC_OR_DEPARTMENT_OTHER): Payer: Self-pay | Admitting: Emergency Medicine

## 2021-07-16 ENCOUNTER — Emergency Department (HOSPITAL_BASED_OUTPATIENT_CLINIC_OR_DEPARTMENT_OTHER)
Admission: EM | Admit: 2021-07-16 | Discharge: 2021-07-16 | Disposition: A | Discharge status: Home / Self Care | Payer: Medicare Other | Attending: Emergency Medicine | Admitting: Emergency Medicine

## 2021-07-16 ENCOUNTER — Encounter (HOSPITAL_BASED_OUTPATIENT_CLINIC_OR_DEPARTMENT_OTHER): Payer: Self-pay | Admitting: Emergency Medicine

## 2021-07-16 DIAGNOSIS — M7732 Calcaneal spur, left foot: Secondary | ICD-10-CM | POA: Diagnosis not present

## 2021-07-16 DIAGNOSIS — M79672 Pain in left foot: Secondary | ICD-10-CM

## 2021-07-16 DIAGNOSIS — Z79899 Other long term (current) drug therapy: Secondary | ICD-10-CM | POA: Insufficient documentation

## 2021-07-16 DIAGNOSIS — Z7982 Long term (current) use of aspirin: Secondary | ICD-10-CM | POA: Diagnosis not present

## 2021-07-16 DIAGNOSIS — I10 Essential (primary) hypertension: Secondary | ICD-10-CM | POA: Insufficient documentation

## 2021-07-16 DIAGNOSIS — M7989 Other specified soft tissue disorders: Secondary | ICD-10-CM | POA: Diagnosis not present

## 2021-07-16 DIAGNOSIS — M79605 Pain in left leg: Secondary | ICD-10-CM | POA: Diagnosis not present

## 2021-07-16 MED ORDER — DEXAMETHASONE 4 MG PO TABS
10.0000 mg | ORAL_TABLET | Freq: Once | ORAL | Status: AC
Start: 2021-07-16 — End: 2021-07-16
  Administered 2021-07-16: 10 mg via ORAL
  Filled 2021-07-16: qty 3

## 2021-07-16 NOTE — ED Provider Notes (Signed)
?[facility] ?Provider Note ? ? ?CSN: [id number] ?Arrival date & time: 07/16/21  1407 ? ?  ? ?History ? ?Chief Complaint  ?Patient presents with  ? Foot Pain  ? ? ?Trevor Cooke is a 81 y.o. male. ? ?HPI ? ?  ? ?81yo male with history of hypertension, hyperlipidemia, CVA, MDS, presents with concern for left heel pain and leg swelling. ? ?Began about one week ago, worsened over lats few days.  Worse when he firstps out of bed in the AM but continuing throughout the day. Severe with walking on it, not painful when resting. Left leg has been swelling over the last day.  No long trips, recent surgery, immobilization. No fevers, chest pain, dyspnea. No injuries. Got new shoes and tried them but then switched to older shoes again.  Has podiatry appt on Thursday.  ? ?Past Medical History:  ?Diagnosis Date  ? Arthritis   ? in neck  ? CVA (cerebral infarction) 1996  ? GERD (gastroesophageal reflux disease)   ? History of prediabetes   ? Hyperlipidemia   ? Hypertension   ? MDS (myelodysplastic syndrome), low grade ([place]) 04/26/2016  ? Neutropenia ([place])   ? Psoriasis   ? Thyroid disease   ?  ? ?Home Medications ?Prior to Admission medications   ?Medication Sig Start Date End Date Taking? Authorizing Provider  ?aspirin 81 MG tablet Take 81 mg by mouth daily.    [provider]  ?calcipotriene-betamethasone (TACLONEX SCALP) external suspension Apply topically daily.    [provider]  ?CALCITRIOL EX Apply topically daily as needed.    [provider]  ?Cholecalciferol (VITAMIN D) 2000 units CAPS Take 2,000 Units by mouth daily.    [provider]  ?clobetasol cream (TEMOVATE) 0.08 % Apply 1 application topically as needed.    [provider]  ?Emollient (EUCERIN) lotion Apply topically as needed for dry skin.    [provider]  ?enalapril (VASOTEC) 10 MG tablet Take 1 tablet (10 mg total) by mouth daily. ?Patient taking differently: Take 10  mg by mouth daily. Takes at night 10/15/20   Carollee Herter, Kendrick Fries R, DO  ?furosemide (LASIX) 20 MG tablet Take 1 tablet (20 mg total) by mouth daily. 10/15/20   Roma Schanz R, DO  ?glucose blood (FREESTYLE PRECISION NEO TEST) test strip Check glucose 1 time daily dx:E11.9 04/19/21   Carollee Herter, Alferd Apa, DO  ?labetalol (NORMODYNE) 100 MG tablet Take 1 tablet (100 mg total) by mouth 2 (two) times daily. 10/15/20   [name], DO  ?levothyroxine (SYNTHROID) 75 MCG tablet Take 1 tablet (75 mcg total) by mouth daily. 10/15/20   [name], DO  ?lovastatin (MEVACOR) 20 MG tablet Take 1 tablet (20 mg total) by mouth at bedtime. 10/15/20   [name], DO  ?Multiple Vitamins-Minerals (MULTIVITAMIN WITH MINERALS) tablet Take 1 tablet by mouth daily. CitraVit    [provider]  ?NIFEdipine (PROCARDIA XL/NIFEDICAL XL) 60 MG 24 hr tablet Take 1 tablet (60 mg total) by mouth daily. 10/15/20   [name], DO  ?omeprazole (PRILOSEC) 20 MG capsule Take 1 capsule by mouth daily. 06/24/20   [provider]  ?tadalafil (CIALIS) 20 MG tablet Take 0.5-1 tablets (10-20 mg total) by mouth every other day as needed for erectile dysfunction. 03/09/20   [name], DO  ?testosterone cypionate (DEPOTESTOSTERONE CYPIONATE) 200 MG/ML injection Inject 75 mg into the muscle once  a week.    [provider]  ?Zinc 50 MG CAPS Take 1 capsule by mouth daily.    [provider]  ?   ? ?Allergies    ?Patient has no known allergies.   ? ?Review of Systems   ?Review of Systems ? ?Physical Exam ?Updated Vital Signs ?BP 124/71 (BP Location: Right Arm)   Pulse (!) 53   Temp 97.6 ?F (36.4 ?C) (Oral)   Resp 16   SpO2 96%  ?Physical Exam ?Vitals and nursing note reviewed.  ?Constitutional:   ?   General: He is not in acute distress. ?   Appearance: He is well-developed. He is not diaphoretic.  ?HENT:  ?   Head: Normocephalic and atraumatic.  ?Eyes:  ?    Conjunctiva/sclera: Conjunctivae normal.  ?Cardiovascular:  ?   Rate and Rhythm: Normal rate and regular rhythm.  ?   Heart sounds: Normal heart sounds. No murmur heard. ?  No friction rub. No gallop.  ?Pulmonary:  ?   Effort: Pulmonary effort is normal. No respiratory distress.  ?   Breath sounds: Normal breath sounds. No wheezing or rales.  ?Abdominal:  ?   General: There is no distension.  ?   Palpations: Abdomen is soft.  ?   Tenderness: There is no abdominal tenderness. There is no guarding.  ?Musculoskeletal:  ?   Cervical back: Normal range of motion.  ?Skin: ?   General: Skin is warm and dry.  ?Neurological:  ?   Mental Status: He is alert and oriented to person, place, and time.  ? ? ?ED Results / Procedures / Treatments   ?Labs ?(all labs ordered are listed, but only abnormal results are displayed) ?Labs Reviewed - No data to display ? ?EKG ?None ? ?Radiology ?DG Ankle Complete Left ? ?Result Date: 07/16/2021 ?CLINICAL DATA:  Pain and swelling. Left foot and ankle pain and swelling for 1 week. EXAM: LEFT ANKLE COMPLETE - 3+ VIEW COMPARISON:  Left calcaneus radiographs 09/18/2013 FINDINGS: There 2 small chronic ossicles just distal to the medial malleolus on frontal view. The ankle mortise remains symmetric and intact. Small plantar calcaneal heel spur. Minimal chronic enthesopathic change at the Achilles insertion on the calcaneus. No acute fracture or dislocation. Vascular calcifications are noted. IMPRESSION: Small plantar calcaneal heel spur. No acute fracture. Electronically Signed   By: Yvonne Kendall M.D.   On: 07/16/2021 14:56  ? ?US Venous Img Lower Unilateral Left ? ?Result Date: 07/16/2021 ?CLINICAL DATA:  Swelling and pain left leg. EXAM: Left LOWER EXTREMITY VENOUS DOPPLER ULTRASOUND TECHNIQUE: Gray-scale sonography with compression, as well as color and duplex ultrasound, were performed to evaluate the deep venous system(s) from the level of the common femoral vein through the popliteal and  proximal calf veins. COMPARISON:  None. FINDINGS: VENOUS Normal compressibility of the common femoral, superficial femoral, and popliteal veins, as well as the visualized calf veins. Visualized portions of profunda femoral vein and great saphenous vein unremarkable. No filling defects to suggest DVT on grayscale or color Doppler imaging. Doppler waveforms show normal direction of venous flow, normal respiratory plasticity and response to augmentation. Limited views of the contralateral common femoral vein are unremarkable. OTHER None. Limitations: none IMPRESSION: No deep venous thrombosis of left lower extremity. Electronically Signed   By: Iven Finn M.D.   On: 07/16/2021 16:39   ? ?Procedures ?Procedures  ? ? ?Medications Ordered in ED ?Medications  ?dexamethasone (DECADRON) tablet 10 mg (10 mg Oral Given 07/16/21 1659)  ? ? ?  ED Course/ Medical Decision Making/ A&P ?  ?                        ?Medical Decision Making ?Amount and/or Complexity of Data Reviewed ?Radiology: ordered. ? ?Risk ?Prescription drug management. ? ? ?81yo male with history of hypertension, hyperlipidemia, CVA, MDS, presents with concern for left heel pain and leg swelling. ? ?No fever, no erythema, no hx of septic joints, low suspicion for septic arthritis, abscess, osteomyelitis at this time. Normal pulses and doubt acute arterial thrombus. Doubt gout given location of pain over heel. ? ?XR with small calcaneal heel spur. ? ?Given leg swelling, DVT study performed and negative. ? ?Given steroid, consider plantar fasciitis, pain related to heel spur. Recommend close follow up as scheduled on Thursday with Podiatry.  Patient discharged in stable condition with understanding of reasons to return.  ? ? ? ? ? ? ? ?Final Clinical Impression(s) / ED Diagnoses ?Final diagnoses:  ?Heel spur, left  ?Left foot pain  ?Left leg swelling  ? ? ?Rx / DC Orders ?ED Discharge Orders   ? ? None  ? ?  ? ? ?  ?Gareth Morgan, MD ?07/17/21 2259 ? ?

## 2021-07-16 NOTE — ED Notes (Signed)
ED Provider at bedside. 

## 2021-07-16 NOTE — ED Triage Notes (Addendum)
Pt reports left foot/ankle pain and swelling. Denies injury to the area.  ?

## 2021-07-21 ENCOUNTER — Ambulatory Visit: Payer: Medicare Other | Admitting: Podiatry

## 2021-07-28 ENCOUNTER — Encounter: Payer: Self-pay | Admitting: Podiatry

## 2021-07-28 ENCOUNTER — Ambulatory Visit (INDEPENDENT_AMBULATORY_CARE_PROVIDER_SITE_OTHER): Payer: Medicare Other | Admitting: Podiatry

## 2021-07-28 DIAGNOSIS — M722 Plantar fascial fibromatosis: Secondary | ICD-10-CM | POA: Diagnosis not present

## 2021-07-28 MED ORDER — MELOXICAM 15 MG PO TABS: 15.0000 mg | ORAL_TABLET | Freq: Every day | ORAL | 0 refills | Status: DC

## 2021-07-28 NOTE — Patient Instructions (Signed)

## 2021-07-28 NOTE — Progress Notes (Signed)
?  Subjective:  ?Patient ID: Trevor Cooke, male    DOB: 1941/01/30,   MRN: [id number] ? ?No chief complaint on file. ? ? ?81 y.o. male presents for concern of left foot plantar fasciitis. Relates it started a few weeks ago and was getting mostly pain in the morning when standing. Was seem a couple weeks ago in urgent care for left heel pain. X-rays were taken that showed small spur of the heel. Was given a steroid pack and told to follow-up. Relates the pack did help. Also relates concern for his right foot bunion that he has had for several years and has been aching.  Denies any other pedal complaints. Denies n/v/f/c.  ? ?Past Medical History:  ?Diagnosis Date  ? Arthritis   ? in neck  ? CVA (cerebral infarction) 1996  ? GERD (gastroesophageal reflux disease)   ? History of prediabetes   ? Hyperlipidemia   ? Hypertension   ? MDS (myelodysplastic syndrome), low grade ([place]) 04/26/2016  ? Neutropenia ([place])   ? Psoriasis   ? Thyroid disease   ? ? ?Objective:  ?Physical Exam: ?Vascular: DP/PT pulses 2/4 bilateral. CFT <3 seconds. Normal hair growth on digits. No edema.  ?Skin. No lacerations or abrasions bilateral feet.  ?Musculoskeletal: MMT 5/5 bilateral lower extremities in DF, PF, Inversion and Eversion. Deceased ROM in DF of ankle joint. Tender to medial calcaneal tubercle on the left. No pain to achilles or PT tendon. No pain with DF.  ?Neurological: Sensation intact to light touch.  ? ?Assessment:  ? ?1. Plantar fasciitis of left foot   ? ? ? ?Plan:  ?Patient was evaluated and treated and all questions answered. ?Discussed plantar fasciitis with patient.  ?X-rays reviewed and discussed with patient. No acute fractures or dislocations noted. Mild spurring noted at inferior calcaneus.  ?Discussed treatment options including, ice, NSAIDS, supportive shoes, bracing, and stretching. Stretching exercises provided to be done on a daily basis.   ?Prescription for meloxicam provided and sent to pharmacy. ?Follow-up 6  weeks or sooner if any problems arise. In the meantime, encouraged to call the office with any questions, concerns, change in symptoms.  ? ? ? ? ?Lorenda Peck, DPM  ? ? ?

## 2021-08-04 ENCOUNTER — Encounter: Payer: Self-pay | Admitting: Hematology & Oncology

## 2021-08-04 ENCOUNTER — Other Ambulatory Visit: Payer: Self-pay

## 2021-08-04 ENCOUNTER — Inpatient Hospital Stay (HOSPITAL_BASED_OUTPATIENT_CLINIC_OR_DEPARTMENT_OTHER): Payer: Medicare Other | Admitting: Hematology & Oncology

## 2021-08-04 ENCOUNTER — Inpatient Hospital Stay: Payer: Medicare Other | Attending: Hematology & Oncology

## 2021-08-04 VITALS — BP 138/70 | HR 54 | Temp 97.5°F | Resp 18 | Wt 193.0 lb

## 2021-08-04 DIAGNOSIS — D469 Myelodysplastic syndrome, unspecified: Secondary | ICD-10-CM | POA: Diagnosis not present

## 2021-08-04 DIAGNOSIS — D46Z Other myelodysplastic syndromes: Secondary | ICD-10-CM

## 2021-08-04 LAB — CBC WITH DIFFERENTIAL (CANCER CENTER ONLY)
Abs Immature Granulocytes: 0.03 10*3/uL (ref 0.00–0.07)
Basophils Absolute: 0 10*3/uL (ref 0.0–0.1)
Basophils Relative: 0 %
Eosinophils Absolute: 0 10*3/uL (ref 0.0–0.5)
Eosinophils Relative: 1 %
HCT: 40.4 % (ref 39.0–52.0)
Hemoglobin: 14 g/dL (ref 13.0–17.0)
Immature Granulocytes: 1 %
Lymphocytes Relative: 43 %
Lymphs Abs: 1 10*3/uL (ref 0.7–4.0)
MCH: 34.1 pg — ABNORMAL HIGH (ref 26.0–34.0)
MCHC: 34.7 g/dL (ref 30.0–36.0)
MCV: 98.3 fL (ref 80.0–100.0)
Monocytes Absolute: 0.2 10*3/uL (ref 0.1–1.0)
Monocytes Relative: 7 %
Neutro Abs: 1.1 10*3/uL — ABNORMAL LOW (ref 1.7–7.7)
Neutrophils Relative %: 48 %
Platelet Count: 162 10*3/uL (ref 150–400)
RBC: 4.11 MIL/uL — ABNORMAL LOW (ref 4.22–5.81)
RDW: 13.2 % (ref 11.5–15.5)
WBC Count: 2.3 10*3/uL — ABNORMAL LOW (ref 4.0–10.5)
nRBC: 0 % (ref 0.0–0.2)

## 2021-08-04 LAB — CMP (CANCER CENTER ONLY)
ALT: 14 U/L (ref 0–44)
AST: 21 U/L (ref 15–41)
Albumin: 4.4 g/dL (ref 3.5–5.0)
Alkaline Phosphatase: 72 U/L (ref 38–126)
Anion gap: 5 (ref 5–15)
BUN: 15 mg/dL (ref 8–23)
CO2: 32 mmol/L (ref 22–32)
Calcium: 9.2 mg/dL (ref 8.9–10.3)
Chloride: 100 mmol/L (ref 98–111)
Creatinine: 1.19 mg/dL (ref 0.61–1.24)
GFR, Estimated: >60 mL/min (ref >60)
Glucose, Bld: 129 mg/dL — ABNORMAL HIGH (ref 70–99)
Potassium: 3.8 mmol/L (ref 3.5–5.1)
Sodium: 137 mmol/L (ref 135–145)
Total Bilirubin: 0.9 mg/dL (ref 0.3–1.2)
Total Protein: 7.1 g/dL (ref 6.5–8.1)

## 2021-08-04 LAB — IRON AND IRON BINDING CAPACITY (CC-WL,HP ONLY)
Iron: 134 ug/dL (ref 45–182)
Saturation Ratios: 40 % — ABNORMAL HIGH (ref 17.9–39.5)
TIBC: 332 ug/dL (ref 250–450)
UIBC: 198 ug/dL (ref 117–376)

## 2021-08-04 LAB — RETICULOCYTES
Immature Retic Fract: 9.8 % (ref 2.3–15.9)
RBC.: 4.09 MIL/uL — ABNORMAL LOW (ref 4.22–5.81)
Retic Count, Absolute: 81.8 10*3/uL (ref 19.0–186.0)
Retic Ct Pct: 2 % (ref 0.4–3.1)

## 2021-08-04 LAB — SAVE SMEAR(SSMR), FOR PROVIDER SLIDE REVIEW

## 2021-08-04 LAB — FERRITIN: Ferritin: 50 ng/mL (ref 24–336)

## 2021-08-04 NOTE — Progress Notes (Signed)
?Hematology and Oncology Follow Up Visit ? ?Trevor Cooke ?[id number] ?1940/10/18 81 y.o. ?08/04/2021 ? ? ?Principle Diagnosis:  ?Myelodysplastic syndrome-low-grade (IPSS-R score = 1) - tp53+ ? ?Current Therapy:   ?Observation ?    ?Interim History:  Trevor Cooke is back for follow-up.  We see him every 6 months.  He has been traveling as always.  I am very impressed that he will have his 58th wedding anniversary coming up.  I think this will be in June. ? ?He has had no problems with fever.  He has had no issues with infection.  He has had no problems with nausea or vomiting.  There is been no change in bowel or bladder habits. ? ?His last iron studies back in November showed a ferritin of 56 with an iron saturation of 29%. ? ?Currently, I would say his performance status is probably ECOG 1. ? ? ?Medications:  ?Current Outpatient Medications:  ?  aspirin 81 MG tablet, Take 81 mg by mouth daily., Disp: , Rfl:  ?  calcipotriene-betamethasone (TACLONEX SCALP) external suspension, Apply topically daily., Disp: , Rfl:  ?  CALCITRIOL EX, Apply topically daily as needed., Disp: , Rfl:  ?  Cholecalciferol (VITAMIN D) 2000 units CAPS, Take 2,000 Units by mouth daily., Disp: , Rfl:  ?  clobetasol cream (TEMOVATE) 6.20 %, Apply 1 application topically as needed., Disp: , Rfl:  ?  Emollient (EUCERIN) lotion, Apply topically as needed for dry skin., Disp: , Rfl:  ?  enalapril (VASOTEC) 10 MG tablet, Take 1 tablet (10 mg total) by mouth daily. (Patient taking differently: Take 10 mg by mouth daily. Takes at night), Disp: 90 tablet, Rfl: 3 ?  furosemide (LASIX) 20 MG tablet, Take 1 tablet (20 mg total) by mouth daily., Disp: 90 tablet, Rfl: 3 ?  glucose blood (FREESTYLE PRECISION NEO TEST) test strip, Check glucose 1 time daily dx:E11.9, Disp: 100 each, Rfl: 4 ?  labetalol (NORMODYNE) 100 MG tablet, Take 1 tablet (100 mg total) by mouth 2 (two) times daily., Disp: 180 tablet, Rfl: 3 ?  levothyroxine (SYNTHROID) 75 MCG tablet,  Take 1 tablet (75 mcg total) by mouth daily., Disp: 90 tablet, Rfl: 3 ?  lovastatin (MEVACOR) 20 MG tablet, Take 1 tablet (20 mg total) by mouth at bedtime., Disp: 90 tablet, Rfl: 3 ?  Multiple Vitamins-Minerals (MULTIVITAMIN WITH MINERALS) tablet, Take 1 tablet by mouth daily. CitraVit, Disp: , Rfl:  ?  NIFEdipine (PROCARDIA XL/NIFEDICAL XL) 60 MG 24 hr tablet, Take 1 tablet (60 mg total) by mouth daily., Disp: 90 tablet, Rfl: 3 ?  omeprazole (PRILOSEC) 20 MG capsule, Take 1 capsule by mouth daily., Disp: , Rfl:  ?  tadalafil (CIALIS) 20 MG tablet, Take 0.5-1 tablets (10-20 mg total) by mouth every other day as needed for erectile dysfunction., Disp: 5 tablet, Rfl: 11 ?  testosterone cypionate (DEPOTESTOSTERONE CYPIONATE) 200 MG/ML injection, Inject 75 mg into the muscle once a week., Disp: , Rfl:  ?  Zinc 50 MG CAPS, Take 1 capsule by mouth daily., Disp: , Rfl:  ? ?Allergies: No Known Allergies ? ?Past Medical History, Surgical history, Social history, and Family History were reviewed and updated. ? ?Review of Systems: ?Review of Systems  ?Constitutional: Negative.   ?HENT: Negative.    ?Eyes: Negative.   ?Respiratory: Negative.    ?Cardiovascular: Negative.   ?Gastrointestinal: Negative.   ?Genitourinary: Negative.   ?Musculoskeletal: Negative.   ?Skin: Negative.   ?Neurological: Negative.   ?Endo/Heme/Allergies: Negative.   ?Psychiatric/Behavioral: Negative.    ? ? ?  Physical Exam: ? weight is 193 lb (87.5 kg). His oral temperature is 97.5 ?F (36.4 ?C) (abnormal). His blood pressure is 138/70 and his pulse is 54 (abnormal). His respiration is 18 and oxygen saturation is 100%.  ? ?Wt Readings from Last 3 Encounters:  ?08/04/21 193 lb (87.5 kg)  ?04/19/21 192 lb 6.4 oz (87.3 kg)  ?02/04/21 191 lb (86.6 kg)  ? ? ? ?Physical Exam ?Vitals reviewed.  ?HENT:  ?   Head: Normocephalic and atraumatic.  ?Eyes:  ?   Pupils: Pupils are equal, round, and reactive to light.  ?Cardiovascular:  ?   Rate and Rhythm: Normal rate  and regular rhythm.  ?   Heart sounds: Normal heart sounds.  ?Pulmonary:  ?   Effort: Pulmonary effort is normal.  ?   Breath sounds: Normal breath sounds.  ?Abdominal:  ?   General: Bowel sounds are normal.  ?   Palpations: Abdomen is soft.  ?Musculoskeletal:     ?   General: No tenderness or deformity. Normal range of motion.  ?   Cervical back: Normal range of motion.  ?Lymphadenopathy:  ?   Cervical: No cervical adenopathy.  ?Skin: ?   General: Skin is warm and dry.  ?   Findings: No erythema or rash.  ?Neurological:  ?   Mental Status: He is alert and oriented to person, place, and time.  ?Psychiatric:     ?   Behavior: Behavior normal.     ?   Thought Content: Thought content normal.     ?   Judgment: Judgment normal.  ? ? ? ?Lab Results  ?Component Value Date  ? WBC 2.3 (L) 08/04/2021  ? HGB 14.0 08/04/2021  ? HCT 40.4 08/04/2021  ? MCV 98.3 08/04/2021  ? PLT 162 08/04/2021  ? ?  Chemistry   ?   ?Component Value Date/Time  ? NA 137 08/04/2021 0838  ? NA 144 04/26/2016 1117  ? K 3.8 08/04/2021 0838  ? K 3.9 04/26/2016 1117  ? CL 100 08/04/2021 0838  ? CL 98 04/26/2016 1117  ? CO2 32 08/04/2021 0838  ? CO2 18 04/26/2016 1117  ? BUN 15 08/04/2021 0838  ? BUN 13 04/26/2016 1117  ? CREATININE 1.19 08/04/2021 0838  ? CREATININE 1.2 04/26/2016 1117  ?    ?Component Value Date/Time  ? CALCIUM 9.2 08/04/2021 0838  ? CALCIUM 9.1 04/26/2016 1117  ? ALKPHOS 72 08/04/2021 0838  ? ALKPHOS 99 (H) 04/26/2016 1117  ? AST 21 08/04/2021 0838  ? ALT 14 08/04/2021 0838  ? ALT 22 04/26/2016 1117  ? BILITOT 0.9 08/04/2021 0838  ?  ? ? ?Impression and Plan: ?Trevor Cooke is a 81 year old white male. He has myelodysplasia. This is manifested as leukopenia.  ? ?I looked at his blood under the microscope.  I did not see any immature myeloid cells.  He has no nucleated red blood cells.  His platelets look fine and are well granulated.  He had no blasts. ? ?From my point of view, his myelodysplasia is holding nice and steady right now.   I am happy about this.  His quality of life is doing well. ? ?I still think we can follow him up in 6 months.  I just would not imagine that his blood counts are going to change significantly in that time period. ? ? ?Volanda Napoleon, MD ?5/11/202310:11 AM ?

## 2021-08-11 ENCOUNTER — Ambulatory Visit (INDEPENDENT_AMBULATORY_CARE_PROVIDER_SITE_OTHER): Payer: Medicare Other | Admitting: Pharmacist

## 2021-08-11 DIAGNOSIS — E119 Type 2 diabetes mellitus without complications: Secondary | ICD-10-CM

## 2021-08-11 DIAGNOSIS — E785 Hyperlipidemia, unspecified: Secondary | ICD-10-CM

## 2021-08-11 DIAGNOSIS — R7989 Other specified abnormal findings of blood chemistry: Secondary | ICD-10-CM

## 2021-08-11 DIAGNOSIS — I10 Essential (primary) hypertension: Secondary | ICD-10-CM

## 2021-08-11 NOTE — Patient Instructions (Signed)
[name] It was a pleasure speaking with you  Below is a summary of your health goals and care plan   Patient Goals/Self-Care Activities Take medications as prescribed check blood pressure 2 to 3 times per week, document, and provide at future appointments Check blood glucose weekly  Blood glucose goals  Fasting blood glucose goal (before meals) = 80 to 130 Blood glucose goal after a meal = less than 180  target a minimum of 150 minutes of moderate intensity exercise weekly Get Tetanus booster    If you have any questions or concerns, please feel free to contact me either at the phone number below or with a MyChart message.   Keep up the good work!  Cherre Robins, PharmD Clinical Pharmacist [facility] [phone] (direct line)  [phone] (main office number)  CARE PLAN - updated 08/11/2021    Hypertension BP Readings from Last 3 Encounters:  08/04/21 138/70  07/16/21 124/71  04/19/21 128/88   Pharmacist Clinical Goal(s): Over the next 90 days, patient will work with PharmD and providers to maintain BP goal <140/90 Current regimen:  Enalapril '10mg'$  daily Labetaolol '100mg'$  twice daily Furosemide '20mg'$  daily Nifedipine XL '60mg'$  daily Interventions: Recommended patient bring blood pressure records and blood pressure cuff to appointment with PCP 04/19/2020 to check accuracy of home monitor.  Patient self care activities - Over the next 90 days, patient will: Maintain hypertension medication regimen.  check blood pressure 2 to 3 times per week, document, and provide at future appointments   Hyperlipidemia Lab Results  Component Value Date/Time   [facility] 74 04/19/2021 09:28 AM   Pharmacist Clinical Goal(s): Over the next 90 days, patient will work with PharmD and providers to achieve LDL goal < 70 Current regimen:  Lovastatin '20mg'$  daily Interventions: Discussed exercise goal of walking 3 days a week for 30 minutes Patient self  care activities - Over the next 90 days, patient will: Maintain hypertension medication regimen.  Increase exercise as able to goal of 150 minutes per week of physical activity like walking  Follow heart healthy diet - limiting intake of sodium and high fat foods  Diabetes Lab Results  Component Value Date/Time   HGBA1C 6.3 04/19/2021 09:28 AM   HGBA1C 6.1 10/15/2020 09:30 AM   Pharmacist Clinical Goal(s): Over the next 90 days, patient will work with PharmD and providers to maintain A1c goal <7% Current regimen:  Diet and exercise management   Interventions: Discussed diet and exercise Maintain a1c <7% Check blood glucose weekly  Blood glucose goals  Fasting blood glucose goal (before meals) = 80 to 130 Blood glucose goal after a meal = less than 180   Osteopenia Pharmacist Clinical Goal(s) Over the next 90 days, patient will work with PharmD and providers to reduce risk of fracture due to osteopenia Current regimen:  Calcium '600mg'$  daily Vitamin D 2000 units daily Interventions: Discussed intake of '1200mg'$  of calcium daily through diet and/or supplementation Discussed intake of [phone] units of vitamin D through supplementation  Continue with plan to recheck bone density in September 2023.   Medication management Pharmacist Clinical Goal(s): Over the next 90 days, patient will work with PharmD and providers to maintain optimal medication adherence Current pharmacy: [facility] Interventions Comprehensive medication review performed. Continue current medication management strategy Patient self care activities - Over the next 90 days, patient will: Focus on medication adherence by filling and taking medications appropriately  Take medications as prescribed Report any questions  or concerns to PharmD and/or provider(s)  Health Maintenance:  Reviewed vaccination history and discussed benefits of updated Tetanus vaccination Patient to get the following  vaccines at next provider appointment, local pharmacy or [place]: Tetanus   Patient Goals/Self-Care Activities Take medications as prescribed check blood pressure 2 to 3 times per week, document, and provide at future appointments Check blood glucose weekly  Blood glucose goals  Fasting blood glucose goal (before meals) = 80 to 130 Blood glucose goal after a meal = less than 180  target a minimum of 150 minutes of moderate intensity exercise weekly Get Tetanus booster   Patient verbalizes understanding of instructions and care plan provided today and agrees to view in [place]. Active MyChart status and patient understanding of how to access instructions and care plan via MyChart confirmed with patient.

## 2021-08-11 NOTE — Chronic Care Management (AMB) (Signed)
Chronic Care Management Pharmacy Note  08/11/2021 Name:  Trevor Cooke MRN:  [id number] DOB:  1940/10/25   Subjective: Trevor Cooke is an 81 y.o. year old male who is a primary patient of [name], DO.  The CCM team was consulted for assistance with disease management and care coordination needs.    Engaged with patient by telephone for follow up visit in response to provider referral for pharmacy case management and/or care coordination services.   Consent to Services:  The patient was given information about Chronic Care Management services, agreed to services, and gave verbal consent prior to initiation of services.  Please see initial visit note for detailed documentation.   Patient Care Team: Carollee Herter, Alferd Apa, DO as PCP - General (Family Medicine) Eduard Roux, MD (Gastroenterology) Renita Papa, MD as Referring Physician (Dermatology) Paulla Dolly Tamala Fothergill, DPM as Consulting Physician (Podiatry) Marin Olp, Rudell Cobb, MD as Consulting Physician (Oncology) Robbie Louis, MD as Referring Physician (Urology) Cherre Robins, RPH-CPP (Pharmacist)  Recent office visits: 04/19/2021 - Fam Med (Dr Carollee Herter) F/U HTN, HDL and thyroid. removed calcium from med like - patient not taking. Labs checked.  Recent consult visits: 08/04/2021 - Oncology (Dr Marin Olp) F/U MDS. No current therapy - observation. Labs checked - noted to be stable. F/U 6 months. Removed meloxicam from med list - patient no longer needs.  07/28/2021 - Podiatry (Dr Blenda Mounts) Seen for inital consult for plantar fasciitis of left foot. Prescribed meloxicam 90m daily. F/U 6 weeks.  07/15/2021 - opthalmology (Dr [facility]) Eye exam. Did not see mention of +/- retinopathy.   07[facility]11[facility]2021 [facility] hypogonasidm and prostrate screening. Labs checked. Continue testosterone injections. DEXA ordered for 11/2021. F/U 6 months.  02/04/2021 - Hem/Onc (Dr  EMarin Olp F/U melodysplastic syndrome. Labs checked - stable. F/U 6 months.  [facility]: [facility] Xray showed small heel spur. Checked for DVT - negative. Given dexamethasone 118morally for 1 dose. Referrred to podiatry.  Objective:  Lab Results  Component Value Date   CREATININE 1.19 08/04/2021   CREATININE 1.12 04/19/2021   CREATININE 1.29 (H) 02/04/2021    Lab Results  Component Value Date   HGBA1C 6.3 04/19/2021   Last diabetic Eye exam:  Lab Results  Component Value Date/Time   HMDIABEYEEXA No Retinopathy 12/26/2014 12:00 AM    Last diabetic Foot exam: No results found for: HMDIABFOOTEX      Component Value Date/Time   CHOL 118 04/19/2021 0928   TRIG 70.0 04/19/2021 0928   TRIG 74 04/03/2006 1152   HDL 30.30 (L) 04/19/2021 0928   CHOLHDL 4 04/19/2021 0928   VLDL 14.0 04/19/2021 0928   LDLCALC 74 04/19/2021 0928       Latest Ref Rng & Units 08/04/2021    8:38 AM 04/19/2021    9:28 AM 02/04/2021    8:48 AM  Hepatic Function  Total Protein 6.5 - 8.1 g/dL 7.1   7.0   6.9    Albumin 3.5 - 5.0 g/dL 4.4   4.3   4.3    AST 15 - 41 U/L 21   21   23     ALT 0 - 44 U/L 14   15   15     Alk Phosphatase 38 - 126 U/L 72   70   80    Total Bilirubin 0.3 - 1.2 mg/dL 0.9  0.8   0.9      Lab Results  Component Value Date/Time   TSH 4.02 04/19/2021 09:28 AM   TSH 3.29 10/15/2020 09:30 AM   FREET4 0.71 12/28/2015 08:55 AM   FREET4 0.77 09/18/2012 11:07 AM       Latest Ref Rng & Units 08/04/2021    8:38 AM 04/19/2021    9:28 AM 02/04/2021    8:48 AM  CBC  WBC 4.0 - 10.5 K/uL 2.3   2.0 Repeated and verified X2.   2.3    Hemoglobin 13.0 - 17.0 g/dL 14.0   14.2   14.1    Hematocrit 39.0 - 52.0 % 40.4   42.3   41.3    Platelets 150 - 400 K/uL 162   156.0   162      No results found for: VD25OH  Clinical ASCVD: No  The ASCVD Risk score (Arnett DK, et al., 2019) failed to calculate for the  following reasons:   The 2019 ASCVD risk score is only valid for ages 15 to 66     Social History   Tobacco Use  Smoking Status Former  Smokeless Tobacco Never  Tobacco Comments   quit 1970   BP Readings from Last 3 Encounters:  08/04/21 138/70  07/16/21 124/71  04/19/21 128/88   Pulse Readings from Last 3 Encounters:  08/04/21 (!) 54  07/16/21 (!) 53  04/19/21 (!) 56   Wt Readings from Last 3 Encounters:  08/04/21 193 lb (87.5 kg)  04/19/21 192 lb 6.4 oz (87.3 kg)  02/04/21 191 lb (86.6 kg)    Assessment: Review of patient past medical history, allergies, medications, health status, including review of consultants reports, laboratory and other test data, was performed as part of comprehensive evaluation and provision of chronic care management services.   SDOH:  (Social Determinants of Health) assessments and interventions performed:  SDOH Interventions    Flowsheet Row Most Recent Value  SDOH Interventions   Food Insecurity Interventions Intervention Not Indicated  Physical Activity Interventions Other (Comments)       CCM Care Plan  No Known Allergies  Medications Reviewed Today     Reviewed by Cherre Robins, RPH-CPP (Pharmacist) on 08/11/21 at 29  Med List Status: <None>   Medication Order Taking? Sig Documenting Provider Last Dose Status Informant  aspirin 81 MG tablet [id number] Yes Take 81 mg by mouth daily. [provider] Taking Active   calcipotriene-betamethasone [facility] SCALP) external suspension [id number] Yes Apply topically daily. [provider] Taking Active Self  CALCITRIOL EX [id number] No Apply topically daily as needed.  Patient not taking: Reported on 08/11/2021   [provider] Not Taking Active Self  Calcium Carb-Cholecalciferol (CALCIUM 500+D) 500-10 MG-MCG TABS [id number] Yes Take 1 tablet by mouth daily. [provider] Taking Active   Cholecalciferol (VITAMIN D) 2000 units CAPS [id number] Yes Take  2,000 Units by mouth daily. [provider] Taking Active   clobetasol cream (TEMOVATE) 0.05 % [id number] No Apply 1 application topically as needed.  Patient not taking: Reported on 08/11/2021   [provider] Not Taking Active   Emollient (EUCERIN) lotion [id number] Yes Apply topically as needed for dry skin. [provider] Taking Active Self  enalapril (VASOTEC) 10 MG tablet [id number] Yes Take 1 tablet (10 mg total) by mouth daily.  Patient taking differently: Take 10 mg by mouth daily. Takes at night   Carollee Herter, Alferd Apa, DO Taking Active   furosemide (LASIX) 20 MG tablet  [id number] Yes Take 1 tablet (20 mg total) by mouth daily. Roma Schanz R, DO Taking Active   glucose blood (FREESTYLE PRECISION NEO TEST) test strip [id number] Yes Check glucose 1 time daily dx:E11.9 Carollee Herter, Alferd Apa, DO Taking Active   labetalol (NORMODYNE) 100 MG tablet [id number] Yes Take 1 tablet (100 mg total) by mouth 2 (two) times daily. [name], DO Taking Active   levothyroxine (SYNTHROID) 75 MCG tablet [id number] Yes Take 1 tablet (75 mcg total) by mouth daily. [name], DO Taking Active   lovastatin (MEVACOR) 20 MG tablet [id number] Yes Take 1 tablet (20 mg total) by mouth at bedtime. [name], DO Taking Active   Multiple Vitamins-Minerals (MULTIVITAMIN WITH MINERALS) tablet [id number] Yes Take 1 tablet by mouth daily. CitraVit [provider] Taking Active            Med Note Antony Contras, Adria Dill Aug 11, 2021  2:06 PM)    NIFEdipine (PROCARDIA XL/NIFEDICAL XL) 60 MG 24 hr tablet [id number] Yes Take 1 tablet (60 mg total) by mouth daily. Roma Schanz R, DO Taking Active   omeprazole (PRILOSEC) 20 MG capsule [id number] Yes Take 1 capsule by mouth daily. [provider] Taking Active   tadalafil (CIALIS) 20 MG tablet [id number] Yes Take 0.5-1 tablets (10-20 mg total) by mouth every other day as needed for erectile  dysfunction. [name], DO Taking Active   testosterone cypionate (DEPOTESTOSTERONE CYPIONATE) 200 MG/ML injection [id number] Yes Inject 75 mg into the muscle once a week. [provider] Taking Active Self           Med Note Jonathon Jordan Aug 11, 2021  2:07 PM) Gets at compounding pharmacy  Zinc 50 MG CAPS [id number] Yes Take 1 capsule by mouth daily. [provider] Taking Active             Patient Active Problem List   Diagnosis Date Noted   Other neutropenia ([place]) 04/19/2021   Type 2 diabetes mellitus with diabetic peripheral angiopathy without gangrene, without long-term current use of insulin ([place]) 04/19/2021   MDS (myelodysplastic syndrome), low grade ([place]) 04/26/2016   Pharyngoesophageal dysphagia 12/03/2014   Routine health maintenance 08/01/2011   PSORIASIS 05/31/2010   CHEST WALL PAIN, ACUTE 04/12/2010   Hypogonadism male 02/12/2007   Hypothyroidism 10/06/2006   Diabetes mellitus, type 2 ([place]) 10/06/2006   Hyperlipidemia 10/06/2006   Primary hypertension 10/06/2006   GERD 10/06/2006   Diet-controlled diabetes mellitus ([place]) 10/06/2006   Anemia 10/06/2006    Immunization History  Administered Date(s) Administered   Fluad Quad(high Dose 65+) 01/02/2019, 01/08/2020, 01/17/2021   H1N1 04/21/2008   Influenza Split 12/28/2003, 01/02/2008, 12/29/2009, 01/30/2012   Influenza Whole 12/28/2003, 01/02/2008, 12/29/2009   Influenza, High Dose Seasonal PF 01/27/2013, 01/30/2014, 01/28/2015, 01/27/2016, 01/25/2017, 01/23/2018, 01/02/2019, 01/08/2020   PFIZER Comirnaty(Gray Top)Covid-19 Tri-Sucrose Vaccine 04/19/2019, 05/10/2019, 01/29/2020, 08/12/2020   PFIZER(Purple Top)SARS-COV-2 Vaccination 04/19/2019, 05/10/2019, 01/29/2020   Pfizer Covid-19 Vaccine Bivalent Booster 26yr & up 02/04/2021   Pneumococcal Conjugate-13 04/07/2013   Pneumococcal Polysaccharide-23 04/12/2010, 09/15/2019   Td 09/25/1995, 09/25/1995   Tdap 10/21/2010   Zoster  Recombinat (Shingrix) 11/12/2017, 02/07/2018   Zoster, Live 01/30/2012, 11/12/2017, 02/07/2018   Zoster, Unspecified 11/12/2017, 02/07/2018    Conditions to be addressed/monitored: HTN, HLD, DMII, and osteopenia  Care Plan : General Pharmacy (Adult)  Updates made by ECherre Robins RPH-CPP since 08/11/2021 12:00 AM  Problem: HTN, HLD, DM, Hypothyroidism   Priority: High  Onset Date: 06/08/2020     Long-Range Goal: Patient-Specific Goal   Start Date: 06/07/2020  Expected End Date: 12/09/2020  Recent Progress: On track  Priority: High  Note:   Current Barriers:  No barriers identified at today's visit.   Pharmacist Clinical Goal(s):  Over the next 180 days, patient will achieve adherence to monitoring guidelines and medication adherence to achieve therapeutic efficacy maintain control of blood pressure and glucose as evidenced by maintaining goals listed below adhere to prescribed medication regimen as evidenced by filld ates through collaboration with PharmD and provider.   Interventions: 1:1 collaboration with Carollee Herter, Alferd Apa, DO regarding development and update of comprehensive plan of care as evidenced by provider attestation and co-signature Inter-disciplinary care team collaboration (see longitudinal plan of care) Comprehensive medication review performed; medication list updated in electronic medical record  Hypertension (BP goal <140/90) Controlled per office blood pressure reading; Had 1 home reading that was above goal but blood pressure usually < 140/90 Current treatment: Enalapril 51m daily Labetaolol 1050mtwice daily Furosemide 2051maily Nifedipine XL 50m40mily (fills at [employer] insurance used) Medications previously tried: none noted  Current home reading: 150/80; 122/66; 134/74 Denies hypotensive/hypertensive symptoms Interventions:  Reviewed blood pressure goals and benefits of medications for prevention of heart attack, stroke and kidney  damage; Recommended continue to monitor blood pressure at home 2 to 3 times per day, document, and provide log at future appointments Continue to walk for exercise; Discussed exercise goal of 150 minutes per week; Recommended to continue current medication  Hyperlipidemia: (LDL goal < 70) Controlled Current treatment: Lovastatin 20mg31mly Medications previously tried: none noted Exercise:  walking for 30 minutes 2 or days per week and yardwork.  Interventions:  Educated on Cholesterol goals;  Discussed benefits of statin for ASCVD risk reduction; \ Recommended to continue current medication  Diabetes (A1c goal <7%) Controlled Current medications: None Medications previously tried: none noted  Current home glucose readings: checked once weekly - usually 96 to 105 Report no BG <80 or over 150 Denies hypoglycemic/hyperglycemic symptoms Current meal patterns: reports that he limits intake of sugar containing foods and breads Current exercise: see above Interventions:  Reviewed A1c goal Reviewed home blood glucose readings and reviewed goals  Fasting blood glucose goal (before meals) = 80 to 130 Blood glucose goal after a meal = less than 180  Discussed exercise goal of 150 minutes per week;  Counseled to check feet daily and get yearly eye exams Recommended continue current management strategies  Osteopenia (Goal: Prevent fractures) Controlled Last DEXA Scan: 12/02/19 T-Score femoral neck: -2.2  T-Score total hip: -1.2 T-Score lumbar spine: -0.7 10-year probability of major osteoporotic fracture: 11% 10-year probability of hip fracture: 5.1% Patient is a candidate for pharmacologic treatment due to T-Score -1.0 to -2.5 and 10-year risk of hip fracture > 3%. Patient is getting testosterone replacement injections which can help with improve / maintain bone density. Of note patient mentions he has had swallowing trouble in the past and will have EGD 10/11/2020. Take into  consideration if plan to start pharmacotherpay in future.  Current treatment  Calcium 600mg 79my Vitamin D 2000 units daily Medications previously tried: none noted Interventions  Recommend [phone] units of vitamin D daily. Recommend 1200 mg of calcium daily from dietary and supplemental sources.  Recommended to continue current supplementation Urologist has plans to recheck DEXA 11/2021  Health Maintenance:  Reviewed vaccination history and  discussed benefits of updated Tetanus vaccination Eye exam completed 05/2021, no mention of retinopathy. Patient to get the following vaccines at next provider appointment, local pharmacy or [place]: Tetanus   Patient Goals/Self-Care Activities Over the next 90 days, patient will:  Take medications as prescribed check blood pressure 2 to 3 times per week, document, and provide at future appointments Check blood glucose weekly  Blood glucose goals  Fasting blood glucose goal (before meals) = 80 to 130 Blood glucose goal after a meal = less than 180  target a minimum of 150 minutes of moderate intensity exercise weekly Get Tetanus booster    Follow Up Plan: The care management team will reach out to the patient again over the next 120 days.        Medication Assistance: None required.  Patient affirms current coverage meets needs.  Patient's preferred pharmacy is:  [id number] [place] [address]Phone: [phone]: [address] [address]Phone: [phone]: [phone] [facility] [address] [facility] [facility] [address]Phone: [phone]: [phone]  Follow Up:  Patient agrees to Care Plan and Follow-up.  Plan:  Telephone follow up appointment with care management team member scheduled for:  08/11/2021  TCherre Robins PharmD Clinical Pharmacist [facility]

## 2021-08-24 DIAGNOSIS — E1169 Type 2 diabetes mellitus with other specified complication: Secondary | ICD-10-CM | POA: Diagnosis not present

## 2021-08-24 DIAGNOSIS — Z87891 Personal history of nicotine dependence: Secondary | ICD-10-CM | POA: Diagnosis not present

## 2021-08-24 DIAGNOSIS — M8580 Other specified disorders of bone density and structure, unspecified site: Secondary | ICD-10-CM

## 2021-08-24 DIAGNOSIS — I10 Essential (primary) hypertension: Secondary | ICD-10-CM

## 2021-08-24 DIAGNOSIS — E785 Hyperlipidemia, unspecified: Secondary | ICD-10-CM | POA: Diagnosis not present

## 2021-09-08 ENCOUNTER — Ambulatory Visit: Payer: Medicare Other | Admitting: Podiatry

## 2021-10-20 ENCOUNTER — Encounter: Payer: Self-pay | Admitting: Family Medicine

## 2021-10-20 ENCOUNTER — Telehealth: Payer: Self-pay | Admitting: Family Medicine

## 2021-10-20 ENCOUNTER — Ambulatory Visit (INDEPENDENT_AMBULATORY_CARE_PROVIDER_SITE_OTHER): Payer: Medicare Other | Admitting: Family Medicine

## 2021-10-20 DIAGNOSIS — I10 Essential (primary) hypertension: Secondary | ICD-10-CM | POA: Diagnosis not present

## 2021-10-20 DIAGNOSIS — E119 Type 2 diabetes mellitus without complications: Secondary | ICD-10-CM | POA: Diagnosis not present

## 2021-10-20 DIAGNOSIS — E785 Hyperlipidemia, unspecified: Secondary | ICD-10-CM | POA: Diagnosis not present

## 2021-10-20 DIAGNOSIS — E039 Hypothyroidism, unspecified: Secondary | ICD-10-CM

## 2021-10-20 LAB — CBC WITH DIFFERENTIAL/PLATELET
Basophils Absolute: 0 10*3/uL (ref 0.0–0.1)
Basophils Relative: 0.3 % (ref 0.0–3.0)
Eosinophils Absolute: 0 10*3/uL (ref 0.0–0.7)
Eosinophils Relative: 2 % (ref 0.0–5.0)
HCT: 40.8 % (ref 39.0–52.0)
Hemoglobin: 14.1 g/dL (ref 13.0–17.0)
Lymphocytes Relative: 41.2 % (ref 12.0–46.0)
Lymphs Abs: 0.9 10*3/uL (ref 0.7–4.0)
MCHC: 34.6 g/dL (ref 30.0–36.0)
MCV: 99.2 fl (ref 78.0–100.0)
Monocytes Absolute: 0.1 10*3/uL (ref 0.1–1.0)
Monocytes Relative: 5.3 % (ref 3.0–12.0)
Neutro Abs: 1.1 10*3/uL — ABNORMAL LOW (ref 1.4–7.7)
Neutrophils Relative %: 51.2 % (ref 43.0–77.0)
Platelets: 161 10*3/uL (ref 150.0–400.0)
RBC: 4.11 Mil/uL — ABNORMAL LOW (ref 4.22–5.81)
RDW: 14.5 % (ref 11.5–15.5)
WBC: 2.1 10*3/uL — ABNORMAL LOW (ref 4.0–10.5)

## 2021-10-20 LAB — COMPREHENSIVE METABOLIC PANEL
ALT: 18 U/L (ref 0–53)
AST: 24 U/L (ref 0–37)
Albumin: 4.5 g/dL (ref 3.5–5.2)
Alkaline Phosphatase: 64 U/L (ref 39–117)
BUN: 15 mg/dL (ref 6–23)
CO2: 32 mEq/L (ref 19–32)
Calcium: 8.9 mg/dL (ref 8.4–10.5)
Chloride: 100 mEq/L (ref 96–112)
Creatinine, Ser: 1.24 mg/dL (ref 0.40–1.50)
GFR: 54.1 mL/min — ABNORMAL LOW (ref >60.00)
Glucose, Bld: 116 mg/dL — ABNORMAL HIGH (ref 70–99)
Potassium: 4.2 mEq/L (ref 3.5–5.1)
Sodium: 138 mEq/L (ref 135–145)
Total Bilirubin: 0.8 mg/dL (ref 0.2–1.2)
Total Protein: 7.3 g/dL (ref 6.0–8.3)

## 2021-10-20 LAB — LIPID PANEL
Cholesterol: 109 mg/dL (ref 0–200)
HDL: 27.7 mg/dL — ABNORMAL LOW (ref >39.00)
LDL Cholesterol: 65 mg/dL (ref 0–99)
NonHDL: 81.24
Total CHOL/HDL Ratio: 4
Triglycerides: 83 mg/dL (ref 0.0–149.0)
VLDL: 16.6 mg/dL (ref 0.0–40.0)

## 2021-10-20 LAB — TSH: TSH: 4.08 u[IU]/mL (ref 0.35–5.50)

## 2021-10-20 MED ORDER — LEVOTHYROXINE SODIUM 75 MCG PO TABS: 75.0000 ug | ORAL_TABLET | Freq: Every day | ORAL | 3 refills | Status: DC

## 2021-10-20 MED ORDER — FREESTYLE PRECISION NEO TEST VI STRP: ORAL_STRIP | 4 refills | Status: DC

## 2021-10-20 MED ORDER — ENALAPRIL MALEATE 10 MG PO TABS: 10.0000 mg | ORAL_TABLET | Freq: Every day | ORAL | 3 refills | Status: DC

## 2021-10-20 MED ORDER — LOVASTATIN 20 MG PO TABS: 20.0000 mg | ORAL_TABLET | Freq: Every day | ORAL | 3 refills | Status: DC

## 2021-10-20 MED ORDER — NIFEDIPINE ER OSMOTIC RELEASE 60 MG PO TB24: 60.0000 mg | ORAL_TABLET | Freq: Every day | ORAL | 3 refills | Status: DC

## 2021-10-20 MED ORDER — LABETALOL HCL 100 MG PO TABS: 100.0000 mg | ORAL_TABLET | Freq: Two times a day (BID) | ORAL | 3 refills | Status: DC

## 2021-10-20 MED ORDER — FUROSEMIDE 20 MG PO TABS: 20.0000 mg | ORAL_TABLET | Freq: Every day | ORAL | 3 refills | Status: DC

## 2021-10-20 NOTE — Assessment & Plan Note (Signed)
Encourage heart healthy diet such as MIND or DASH diet, increase exercise, avoid trans fats, simple carbohydrates and processed foods, consider a krill or fish or flaxseed oil cap daily.  °

## 2021-10-20 NOTE — Patient Instructions (Signed)
Preventive Care 65 Years and Older, Male Preventive care refers to lifestyle choices and visits with your health care provider that can promote health and wellness. Preventive care visits are also called wellness exams. What can I expect for my preventive care visit? Counseling During your preventive care visit, your health care provider may ask about your: Medical history, including: Past medical problems. Family medical history. History of falls. Current health, including: Emotional well-being. Home life and relationship well-being. Sexual activity. Memory and ability to understand (cognition). Lifestyle, including: Alcohol, nicotine or tobacco, and drug use. Access to firearms. Diet, exercise, and sleep habits. Work and work environment. Sunscreen use. Safety issues such as seatbelt and bike helmet use. Physical exam Your health care provider will check your: Height and weight. These may be used to calculate your BMI (body mass index). BMI is a measurement that tells if you are at a healthy weight. Waist circumference. This measures the distance around your waistline. This measurement also tells if you are at a healthy weight and may help predict your risk of certain diseases, such as type 2 diabetes and high blood pressure. Heart rate and blood pressure. Body temperature. Skin for abnormal spots. What immunizations do I need?  Vaccines are usually given at various ages, according to a schedule. Your health care provider will recommend vaccines for you based on your age, medical history, and lifestyle or other factors, such as travel or where you work. What tests do I need? Screening Your health care provider may recommend screening tests for certain conditions. This may include: Lipid and cholesterol levels. Diabetes screening. This is done by checking your blood sugar (glucose) after you have not eaten for a while (fasting). Hepatitis C test. Hepatitis B test. HIV (human  immunodeficiency virus) test. STI (sexually transmitted infection) testing, if you are at risk. Lung cancer screening. Colorectal cancer screening. Prostate cancer screening. Abdominal aortic aneurysm (AAA) screening. You may need this if you are a current or former smoker. Talk with your health care provider about your test results, treatment options, and if necessary, the need for more tests. Follow these instructions at home: Eating and drinking  Eat a diet that includes fresh fruits and vegetables, whole grains, lean protein, and low-fat dairy products. Limit your intake of foods with high amounts of sugar, saturated fats, and salt. Take vitamin and mineral supplements as recommended by your health care provider. Do not drink alcohol if your health care provider tells you not to drink. If you drink alcohol: Limit how much you have to 0-2 drinks a day. Know how much alcohol is in your drink. In the [place] one drink equals one 12 oz bottle of beer (355 mL), one 5 oz glass of wine (148 mL), or one 1 oz glass of hard liquor (44 mL). Lifestyle Brush your teeth every morning and night with fluoride toothpaste. Floss one time each day. Exercise for at least 30 minutes 5 or more days each week. Do not use any products that contain nicotine or tobacco. These products include cigarettes, chewing tobacco, and vaping devices, such as e-cigarettes. If you need help quitting, ask your health care provider. Do not use drugs. If you are sexually active, practice safe sex. Use a condom or other form of protection to prevent STIs. Take aspirin only as told by your health care provider. Make sure that you understand how much to take and what form to take. Work with your health care provider to find out whether it is safe   and beneficial for you to take aspirin daily. Ask your health care provider if you need to take a cholesterol-lowering medicine (statin). Find healthy ways to manage stress, such  as: Meditation, yoga, or listening to music. Journaling. Talking to a trusted person. Spending time with friends and family. Safety Always wear your seat belt while driving or riding in a vehicle. Do not drive: If you have been drinking alcohol. Do not ride with someone who has been drinking. When you are tired or distracted. While texting. If you have been using any mind-altering substances or drugs. Wear a helmet and other protective equipment during sports activities. If you have firearms in your house, make sure you follow all gun safety procedures. Minimize exposure to UV radiation to reduce your risk of skin cancer. What's next? Visit your health care provider once a year for an annual wellness visit. Ask your health care provider how often you should have your eyes and teeth checked. Stay up to date on all vaccines. This information is not intended to replace advice given to you by your health care provider. Make sure you discuss any questions you have with your health care provider. Document Revised: 09/08/2020 Document Reviewed: 09/08/2020 Elsevier Patient Education  2023 [employer]  

## 2021-10-20 NOTE — Progress Notes (Signed)
Subjective:   By signing my name below, I, Trevor Cooke, attest that this documentation has been prepared under the direction and in the presence of [name], DO  10/20/2021   Patient ID: Trevor Cooke, male    DOB: 08-Sep-1940, 81 y.o.   MRN: [id number]  Chief Complaint  Patient presents with   Hypertension   Hyperlipidemia   Hypothyroidism   Follow-up    HPI Patient is in today for follow up office visit.  He reports that he had multiple eye infections due to a nerve. Has plantar fasciitis in his left foot, and he was told to do simple exercises to manage the discomfort. He adds that he could not ambulate without having pain prior to the visit, he is improving at this time.   He states has been gaining weight, he is currently 193 lbs at this time. For his diet, he normally makes meals at home. He regularly sees his urologist and the injections he receives helped. For activity, he works in his yard but admits that he has not been able to walk regularly. He plans to start walking more.   He reports that his hearing is worsening, and he tries to manage this with his hearing aids.  His rib on his left side gives him pain sometimes, but he adds that the pain has been improving. His rib does not hurt when palpated.  He regularly sees the dentist.    Past Medical History:  Diagnosis Date   Arthritis    in neck   CVA (cerebral infarction) 1996   GERD (gastroesophageal reflux disease)    History of prediabetes    Hyperlipidemia    Hypertension    MDS (myelodysplastic syndrome), low grade ([place]) 04/26/2016   Neutropenia ([place])    Psoriasis    Thyroid disease     Past Surgical History:  Procedure Laterality Date   CLEFT PALATE REPAIR  1940's   left     Family History  Problem Relation Age of Onset   Hypertension Mother    Hypertension Father    Stroke Father        died from it    Diabetes Sister    Cancer Maternal Uncle        all 4 had cancer.  One was brain cancer   Diabetes Maternal Uncle    Colon cancer Neg Hx    Thyroid disease Neg Hx     Social History   Socioeconomic History   Marital status: Married    Spouse name: Not on file   Number of children: 3   Years of education: Not on file   Highest education level: Not on file  Occupational History   Not on file  Tobacco Use   Smoking status: Former   Smokeless tobacco: Never   Tobacco comments:    quit 1970  Vaping Use   Vaping Use: Never used  Substance and Sexual Activity   Alcohol use: Yes    Alcohol/week: 0.0 standard drinks of alcohol    Comment: occasional wine   Drug use: No   Sexual activity: Never  Other Topics Concern   Not on file  Social History Narrative   Not on file   Social Determinants of Health   Financial Resource Strain: Low Risk  (04/07/2021)   Overall Financial Resource Strain (CARDIA)    Difficulty of Paying Living Expenses: Not hard at all  Food Insecurity: No Food Insecurity (08/11/2021)   Hunger Vital  Sign    Worried About Charity fundraiser in the Last Year: Never true    [place] in the Last Year: Never true  Transportation Needs: No Transportation Needs (04/07/2021)   PRAPARE - Hydrologist (Medical): No    Lack of Transportation (Non-Medical): No  Physical Activity: Insufficiently Active (08/11/2021)   Exercise Vital Sign    Days of Exercise per Week: 2 days    Minutes of Exercise per Session: 30 min  Stress: Not on file  Social Connections: Not on file  Intimate Partner Violence: Not on file    Outpatient Medications Prior to Visit  Medication Sig Dispense Refill   aspirin 81 MG tablet Take 81 mg by mouth daily.     calcipotriene-betamethasone (TACLONEX SCALP) external suspension Apply topically daily.     Calcium Carb-Cholecalciferol (CALCIUM 500+D) 500-10 MG-MCG TABS Take 1 tablet by mouth daily.     Cholecalciferol (VITAMIN D) 2000 units CAPS Take 2,000 Units by mouth daily.      clobetasol cream (TEMOVATE) 4.00 % Apply 1 application topically as needed. (Patient not taking: Reported on 08/11/2021)     Emollient (EUCERIN) lotion Apply topically as needed for dry skin.     Multiple Vitamins-Minerals (MULTIVITAMIN WITH MINERALS) tablet Take 1 tablet by mouth daily. CitraVit     omeprazole (PRILOSEC) 20 MG capsule Take 1 capsule by mouth daily.     tadalafil (CIALIS) 20 MG tablet Take 0.5-1 tablets (10-20 mg total) by mouth every other day as needed for erectile dysfunction. 5 tablet 11   testosterone cypionate (DEPOTESTOSTERONE CYPIONATE) 200 MG/ML injection Inject 75 mg into the muscle once a week.     Zinc 50 MG CAPS Take 1 capsule by mouth daily.     CALCITRIOL EX Apply topically daily as needed. (Patient not taking: Reported on 08/11/2021)     enalapril (VASOTEC) 10 MG tablet Take 1 tablet (10 mg total) by mouth daily. (Patient taking differently: Take 10 mg by mouth daily. Takes at night) 90 tablet 3   furosemide (LASIX) 20 MG tablet Take 1 tablet (20 mg total) by mouth daily. 90 tablet 3   glucose blood (FREESTYLE PRECISION NEO TEST) test strip Check glucose 1 time daily dx:E11.9 100 each 4   labetalol (NORMODYNE) 100 MG tablet Take 1 tablet (100 mg total) by mouth 2 (two) times daily. 180 tablet 3   levothyroxine (SYNTHROID) 75 MCG tablet Take 1 tablet (75 mcg total) by mouth daily. 90 tablet 3   lovastatin (MEVACOR) 20 MG tablet Take 1 tablet (20 mg total) by mouth at bedtime. 90 tablet 3   NIFEdipine (PROCARDIA XL/NIFEDICAL XL) 60 MG 24 hr tablet Take 1 tablet (60 mg total) by mouth daily. 90 tablet 3   No facility-administered medications prior to visit.    No Known Allergies  Review of Systems  Constitutional:  Negative for fever and malaise/fatigue.       (+) Weight Gain  HENT:  Negative for congestion.   Eyes:  Negative for blurred vision.  Respiratory:  Negative for cough and shortness of breath.   Cardiovascular:  Negative for chest pain, palpitations  and leg swelling.  Gastrointestinal:  Negative for vomiting.  Musculoskeletal:  Negative for back pain.       (+) Plantar Faciitis  Skin:  Negative for rash.  Neurological:  Negative for loss of consciousness and headaches.       Objective:    Physical Exam Constitutional:  Appearance: Normal appearance. He is not ill-appearing.  HENT:     Head: Normocephalic and atraumatic.     Right Ear: External ear normal.     Left Ear: External ear normal.  Eyes:     Extraocular Movements: Extraocular movements intact.     Pupils: Pupils are equal, round, and reactive to light.  Cardiovascular:     Rate and Rhythm: Normal rate and regular rhythm.     Pulses: Normal pulses.     Heart sounds: Normal heart sounds. No murmur heard.    No gallop.  Pulmonary:     Effort: Pulmonary effort is normal. No respiratory distress.     Breath sounds: Normal breath sounds. No wheezing or rales.  Skin:    General: Skin is warm and dry.  Neurological:     Mental Status: He is alert and oriented to person, place, and time.  Psychiatric:        Judgment: Judgment normal.     BP 140/84 (BP Location: Left Arm, Patient Position: Sitting, Cuff Size: Normal)   Pulse (!) 56   Temp 97.7 F (36.5 C) (Oral)   Resp 18   Ht '5\' 8"'$  (1.727 m)   Wt 195 lb 4.8 oz (88.6 kg)   SpO2 96%   BMI 29.70 kg/m  Wt Readings from Last 3 Encounters:  10/20/21 195 lb 4.8 oz (88.6 kg)  08/04/21 193 lb (87.5 kg)  04/19/21 192 lb 6.4 oz (87.3 kg)    Diabetic Foot Exam - Simple   No data filed    Lab Results  Component Value Date   WBC 2.1 Repeated and verified X2. (L) 10/20/2021   HGB 14.1 10/20/2021   HCT 40.8 10/20/2021   PLT 161.0 10/20/2021   GLUCOSE 116 (H) 10/20/2021   CHOL 109 10/20/2021   TRIG 83.0 10/20/2021   HDL 27.70 (L) 10/20/2021   LDLCALC 65 10/20/2021   ALT 18 10/20/2021   AST 24 10/20/2021   NA 138 10/20/2021   K 4.2 10/20/2021   CL 100 10/20/2021   CREATININE 1.24 10/20/2021   BUN 15  10/20/2021   CO2 32 10/20/2021   TSH 4.08 10/20/2021   PSA 0.40 03/09/2020   HGBA1C 6.3 04/19/2021   MICROALBUR 0.8 03/17/2019    Lab Results  Component Value Date   TSH 4.08 10/20/2021   Lab Results  Component Value Date   WBC 2.1 Repeated and verified X2. (L) 10/20/2021   HGB 14.1 10/20/2021   HCT 40.8 10/20/2021   MCV 99.2 10/20/2021   PLT 161.0 10/20/2021   Lab Results  Component Value Date   NA 138 10/20/2021   K 4.2 10/20/2021   CO2 32 10/20/2021   GLUCOSE 116 (H) 10/20/2021   BUN 15 10/20/2021   CREATININE 1.24 10/20/2021   BILITOT 0.8 10/20/2021   ALKPHOS 64 10/20/2021   AST 24 10/20/2021   ALT 18 10/20/2021   PROT 7.3 10/20/2021   ALBUMIN 4.5 10/20/2021   CALCIUM 8.9 10/20/2021   ANIONGAP 5 08/04/2021   GFR 54.10 (L) 10/20/2021   Lab Results  Component Value Date   CHOL 109 10/20/2021   Lab Results  Component Value Date   HDL 27.70 (L) 10/20/2021   Lab Results  Component Value Date   LDLCALC 65 10/20/2021   Lab Results  Component Value Date   TRIG 83.0 10/20/2021   Lab Results  Component Value Date   CHOLHDL 4 10/20/2021   Lab Results  Component Value Date  HGBA1C 6.3 04/19/2021      Colonoscopy- Last completed 03/06/2013. Results have not been released.  Assessment & Plan:   Problem List Items Addressed This Visit       Unprioritized   Primary hypertension    Well controlled, no changes to meds. Encouraged heart healthy diet such as the DASH diet and exercise as tolerated.       Relevant Medications   enalapril (VASOTEC) 10 MG tablet   NIFEdipine (PROCARDIA XL/NIFEDICAL XL) 60 MG 24 hr tablet   furosemide (LASIX) 20 MG tablet   lovastatin (MEVACOR) 20 MG tablet   labetalol (NORMODYNE) 100 MG tablet   Hypothyroidism    Check labs       Relevant Medications   levothyroxine (SYNTHROID) 75 MCG tablet   labetalol (NORMODYNE) 100 MG tablet   Other Relevant Orders   TSH (Completed)   Hyperlipidemia    Encourage heart  healthy diet such as MIND or DASH diet, increase exercise, avoid trans fats, simple carbohydrates and processed foods, consider a krill or fish or flaxseed oil cap daily.       Relevant Medications   enalapril (VASOTEC) 10 MG tablet   NIFEdipine (PROCARDIA XL/NIFEDICAL XL) 60 MG 24 hr tablet   furosemide (LASIX) 20 MG tablet   lovastatin (MEVACOR) 20 MG tablet   labetalol (NORMODYNE) 100 MG tablet   Other Relevant Orders   Comprehensive metabolic panel (Completed)   Lipid panel (Completed)   Diet-controlled diabetes mellitus ([place])    Check labs  hgba1c to be checked , minimize simple carbs. Increase exercise as tolerated. Continue current meds       Relevant Medications   enalapril (VASOTEC) 10 MG tablet   lovastatin (MEVACOR) 20 MG tablet   glucose blood (FREESTYLE PRECISION NEO TEST) test strip   Other Visit Diagnoses     Essential hypertension       Relevant Medications   enalapril (VASOTEC) 10 MG tablet   NIFEdipine (PROCARDIA XL/NIFEDICAL XL) 60 MG 24 hr tablet   furosemide (LASIX) 20 MG tablet   lovastatin (MEVACOR) 20 MG tablet   labetalol (NORMODYNE) 100 MG tablet   Other Relevant Orders   CBC with Differential/Platelet (Completed)   Comprehensive metabolic panel (Completed)   Lipid panel (Completed)   TSH (Completed)        Meds ordered this encounter  Medications   enalapril (VASOTEC) 10 MG tablet    Sig: Take 1 tablet (10 mg total) by mouth daily.    Dispense:  90 tablet    Refill:  3   NIFEdipine (PROCARDIA XL/NIFEDICAL XL) 60 MG 24 hr tablet    Sig: Take 1 tablet (60 mg total) by mouth daily.    Dispense:  90 tablet    Refill:  3   furosemide (LASIX) 20 MG tablet    Sig: Take 1 tablet (20 mg total) by mouth daily.    Dispense:  90 tablet    Refill:  3   lovastatin (MEVACOR) 20 MG tablet    Sig: Take 1 tablet (20 mg total) by mouth at bedtime.    Dispense:  90 tablet    Refill:  3   levothyroxine (SYNTHROID) 75 MCG tablet    Sig: Take 1 tablet  (75 mcg total) by mouth daily.    Dispense:  90 tablet    Refill:  3   labetalol (NORMODYNE) 100 MG tablet    Sig: Take 1 tablet (100 mg total) by mouth 2 (two) times daily.  Dispense:  180 tablet    Refill:  3   glucose blood (FREESTYLE PRECISION NEO TEST) test strip    Sig: Check glucose 1 time daily dx:E11.9    Dispense:  100 each    Refill:  4    I, [name], DO, personally preformed the services described in this documentation.  All medical record entries made by the scribe were at my direction and in my presence.  I have reviewed the chart and discharge instructions (if applicable) and agree that the record reflects my personal performance and is accurate and complete. 10/20/2021   I,Tinashe Williams,acting as a scribe for [name], DO.,have documented all relevant documentation on the behalf of [name], DO,as directed by  [name], DO while in the presence of [name], DO.    [name], DO

## 2021-10-20 NOTE — Assessment & Plan Note (Signed)
Check labs 

## 2021-10-20 NOTE — Assessment & Plan Note (Signed)
Well controlled, no changes to meds. Encouraged heart healthy diet such as the DASH diet and exercise as tolerated.  °

## 2021-10-20 NOTE — Assessment & Plan Note (Signed)
Check labs  hgba1c to be checked, minimize simple carbs. Increase exercise as tolerated. Continue current meds  

## 2021-10-24 ENCOUNTER — Other Ambulatory Visit (HOSPITAL_COMMUNITY): Payer: Self-pay

## 2021-11-23 ENCOUNTER — Other Ambulatory Visit: Payer: Self-pay | Admitting: Family Medicine

## 2021-11-23 DIAGNOSIS — N529 Male erectile dysfunction, unspecified: Secondary | ICD-10-CM

## 2021-11-24 NOTE — Telephone Encounter (Signed)
error 

## 2021-12-02 DIAGNOSIS — E291 Testicular hypofunction: Secondary | ICD-10-CM | POA: Diagnosis not present

## 2022-01-23 DIAGNOSIS — E291 Testicular hypofunction: Secondary | ICD-10-CM | POA: Diagnosis not present

## 2022-01-26 DIAGNOSIS — L298 Other pruritus: Secondary | ICD-10-CM | POA: Diagnosis not present

## 2022-01-26 DIAGNOSIS — R209 Unspecified disturbances of skin sensation: Secondary | ICD-10-CM | POA: Diagnosis not present

## 2022-01-26 DIAGNOSIS — Z872 Personal history of diseases of the skin and subcutaneous tissue: Secondary | ICD-10-CM | POA: Diagnosis not present

## 2022-01-26 DIAGNOSIS — B353 Tinea pedis: Secondary | ICD-10-CM | POA: Diagnosis not present

## 2022-01-26 DIAGNOSIS — R238 Other skin changes: Secondary | ICD-10-CM | POA: Diagnosis not present

## 2022-01-26 DIAGNOSIS — Z09 Encounter for follow-up examination after completed treatment for conditions other than malignant neoplasm: Secondary | ICD-10-CM | POA: Diagnosis not present

## 2022-01-26 DIAGNOSIS — L814 Other melanin hyperpigmentation: Secondary | ICD-10-CM | POA: Diagnosis not present

## 2022-01-26 DIAGNOSIS — L400 Psoriasis vulgaris: Secondary | ICD-10-CM | POA: Diagnosis not present

## 2022-01-26 DIAGNOSIS — X32XXXA Exposure to sunlight, initial encounter: Secondary | ICD-10-CM | POA: Diagnosis not present

## 2022-01-26 DIAGNOSIS — L821 Other seborrheic keratosis: Secondary | ICD-10-CM | POA: Diagnosis not present

## 2022-01-26 DIAGNOSIS — L578 Other skin changes due to chronic exposure to nonionizing radiation: Secondary | ICD-10-CM | POA: Diagnosis not present

## 2022-01-26 DIAGNOSIS — D225 Melanocytic nevi of trunk: Secondary | ICD-10-CM | POA: Diagnosis not present

## 2022-02-01 ENCOUNTER — Other Ambulatory Visit (HOSPITAL_BASED_OUTPATIENT_CLINIC_OR_DEPARTMENT_OTHER): Payer: Self-pay

## 2022-02-01 DIAGNOSIS — Z23 Encounter for immunization: Secondary | ICD-10-CM | POA: Diagnosis not present

## 2022-02-01 MED ORDER — FLUAD QUADRIVALENT 0.5 ML IM PRSY
PREFILLED_SYRINGE | INTRAMUSCULAR | 0 refills | Status: DC
  Filled 2022-02-01: qty 0.5, 1d supply, fill #0

## 2022-02-03 ENCOUNTER — Other Ambulatory Visit: Payer: Self-pay

## 2022-02-03 ENCOUNTER — Inpatient Hospital Stay (HOSPITAL_BASED_OUTPATIENT_CLINIC_OR_DEPARTMENT_OTHER): Payer: Medicare Other | Admitting: Hematology & Oncology

## 2022-02-03 ENCOUNTER — Encounter: Payer: Self-pay | Admitting: Hematology & Oncology

## 2022-02-03 ENCOUNTER — Inpatient Hospital Stay: Payer: Medicare Other | Attending: Hematology & Oncology

## 2022-02-03 VITALS — BP 124/71 | HR 55 | Temp 97.4°F | Resp 18 | Wt 188.0 lb

## 2022-02-03 DIAGNOSIS — Z79899 Other long term (current) drug therapy: Secondary | ICD-10-CM | POA: Insufficient documentation

## 2022-02-03 DIAGNOSIS — D46Z Other myelodysplastic syndromes: Secondary | ICD-10-CM | POA: Diagnosis not present

## 2022-02-03 DIAGNOSIS — D469 Myelodysplastic syndrome, unspecified: Secondary | ICD-10-CM | POA: Diagnosis not present

## 2022-02-03 DIAGNOSIS — Z79890 Hormone replacement therapy (postmenopausal): Secondary | ICD-10-CM | POA: Insufficient documentation

## 2022-02-03 DIAGNOSIS — Z7982 Long term (current) use of aspirin: Secondary | ICD-10-CM | POA: Diagnosis not present

## 2022-02-03 LAB — RETICULOCYTES
Immature Retic Fract: 8.3 % (ref 2.3–15.9)
RBC.: 4.03 MIL/uL — ABNORMAL LOW (ref 4.22–5.81)
Retic Count, Absolute: 75.4 10*3/uL (ref 19.0–186.0)
Retic Ct Pct: 1.9 % (ref 0.4–3.1)

## 2022-02-03 LAB — CBC WITH DIFFERENTIAL (CANCER CENTER ONLY)
Abs Immature Granulocytes: 0.01 10*3/uL (ref 0.00–0.07)
Basophils Absolute: 0 10*3/uL (ref 0.0–0.1)
Basophils Relative: 1 %
Eosinophils Absolute: 0.1 10*3/uL (ref 0.0–0.5)
Eosinophils Relative: 3 %
HCT: 39.9 % (ref 39.0–52.0)
Hemoglobin: 13.7 g/dL (ref 13.0–17.0)
Immature Granulocytes: 1 %
Lymphocytes Relative: 38 %
Lymphs Abs: 0.8 10*3/uL (ref 0.7–4.0)
MCH: 34.3 pg — ABNORMAL HIGH (ref 26.0–34.0)
MCHC: 34.3 g/dL (ref 30.0–36.0)
MCV: 100 fL (ref 80.0–100.0)
Monocytes Absolute: 0.2 10*3/uL (ref 0.1–1.0)
Monocytes Relative: 9 %
Neutro Abs: 1 10*3/uL — ABNORMAL LOW (ref 1.7–7.7)
Neutrophils Relative %: 48 %
Platelet Count: 177 10*3/uL (ref 150–400)
RBC: 3.99 MIL/uL — ABNORMAL LOW (ref 4.22–5.81)
RDW: 13.4 % (ref 11.5–15.5)
WBC Count: 2.1 10*3/uL — ABNORMAL LOW (ref 4.0–10.5)
nRBC: 0 % (ref 0.0–0.2)

## 2022-02-03 LAB — CMP (CANCER CENTER ONLY)
ALT: 14 U/L (ref 0–44)
AST: 20 U/L (ref 15–41)
Albumin: 4.5 g/dL (ref 3.5–5.0)
Alkaline Phosphatase: 64 U/L (ref 38–126)
Anion gap: 7 (ref 5–15)
BUN: 18 mg/dL (ref 8–23)
CO2: 30 mmol/L (ref 22–32)
Calcium: 9.3 mg/dL (ref 8.9–10.3)
Chloride: 101 mmol/L (ref 98–111)
Creatinine: 1.42 mg/dL — ABNORMAL HIGH (ref 0.61–1.24)
GFR, Estimated: 49 mL/min — ABNORMAL LOW (ref >60)
Glucose, Bld: 174 mg/dL — ABNORMAL HIGH (ref 70–99)
Potassium: 4.3 mmol/L (ref 3.5–5.1)
Sodium: 138 mmol/L (ref 135–145)
Total Bilirubin: 0.7 mg/dL (ref 0.3–1.2)
Total Protein: 7.5 g/dL (ref 6.5–8.1)

## 2022-02-03 LAB — IRON AND IRON BINDING CAPACITY (CC-WL,HP ONLY)
Iron: 73 ug/dL (ref 45–182)
Saturation Ratios: 23 % (ref 17.9–39.5)
TIBC: 319 ug/dL (ref 250–450)
UIBC: 246 ug/dL (ref 117–376)

## 2022-02-03 LAB — FERRITIN: Ferritin: 64 ng/mL (ref 24–336)

## 2022-02-03 LAB — SAVE SMEAR(SSMR), FOR PROVIDER SLIDE REVIEW

## 2022-02-03 NOTE — Progress Notes (Signed)
Hematology and Oncology Follow Up Visit  Trevor Cooke [id number] 09/03/1940 81 y.o. 02/03/2022   Principle Diagnosis:  Myelodysplastic syndrome-low-grade (IPSS-R score = 1) - tp53+  Current Therapy:   Observation     Interim History:  Trevor Cooke is back for follow-up.  We see him every 6 months.  Since we last saw him, has been doing pretty well.  He really has had no complaints.  He has had no new medications given.  He has had no nausea or vomiting.  He has had no rashes.  There is been no change in bowel or bladder habits.  He has had no swollen lymph nodes.  When we last saw her in May, his ferritin was 50 with an iron saturation of 40%.  He has had no problems with weight loss or weight gain.  His appetite has been quite good.  Overall, I would say his performance status is probably ECOG 1.    Medications:  Current Outpatient Medications:    aspirin 81 MG tablet, Take 81 mg by mouth daily., Disp: , Rfl:    calcipotriene-betamethasone (TACLONEX SCALP) external suspension, Apply topically daily., Disp: , Rfl:    Calcium Carb-Cholecalciferol (CALCIUM 500+D) 500-10 MG-MCG TABS, Take 1 tablet by mouth daily., Disp: , Rfl:    Cholecalciferol (VITAMIN D) 2000 units CAPS, Take 2,000 Units by mouth daily., Disp: , Rfl:    clobetasol cream (TEMOVATE) 7.82 %, Apply 1 application topically as needed. (Patient not taking: Reported on 08/11/2021), Disp: , Rfl:    Emollient (EUCERIN) lotion, Apply topically as needed for dry skin., Disp: , Rfl:    enalapril (VASOTEC) 10 MG tablet, Take 1 tablet (10 mg total) by mouth daily., Disp: 90 tablet, Rfl: 3   furosemide (LASIX) 20 MG tablet, Take 1 tablet (20 mg total) by mouth daily., Disp: 90 tablet, Rfl: 3   glucose blood (FREESTYLE PRECISION NEO TEST) test strip, Check glucose 1 time daily dx:E11.9, Disp: 100 each, Rfl: 4   influenza vaccine adjuvanted (FLUAD QUADRIVALENT) 0.5 ML injection, Inject into the muscle., Disp: 0.5 mL, Rfl: 0    labetalol (NORMODYNE) 100 MG tablet, Take 1 tablet (100 mg total) by mouth 2 (two) times daily., Disp: 180 tablet, Rfl: 3   levothyroxine (SYNTHROID) 75 MCG tablet, Take 1 tablet (75 mcg total) by mouth daily., Disp: 90 tablet, Rfl: 3   lovastatin (MEVACOR) 20 MG tablet, Take 1 tablet (20 mg total) by mouth at bedtime., Disp: 90 tablet, Rfl: 3   Multiple Vitamins-Minerals (MULTIVITAMIN WITH MINERALS) tablet, Take 1 tablet by mouth daily. CitraVit, Disp: , Rfl:    NIFEdipine (PROCARDIA XL/NIFEDICAL XL) 60 MG 24 hr tablet, Take 1 tablet (60 mg total) by mouth daily., Disp: 90 tablet, Rfl: 3   omeprazole (PRILOSEC) 20 MG capsule, Take 1 capsule by mouth daily., Disp: , Rfl:    tadalafil (CIALIS) 20 MG tablet, TAKE HALF TO ONE TABLET BY MOUTH EVERY OTHER DAY AS NEEDED FOR ERECTILE DYSFUNCTION, Disp: 5 tablet, Rfl: 0   testosterone cypionate (DEPOTESTOSTERONE CYPIONATE) 200 MG/ML injection, Inject 75 mg into the muscle once a week., Disp: , Rfl:    Zinc 50 MG CAPS, Take 1 capsule by mouth daily., Disp: , Rfl:   Allergies: No Known Allergies  Past Medical History, Surgical history, Social history, and Family History were reviewed and updated.  Review of Systems: Review of Systems  Constitutional: Negative.   HENT: Negative.    Eyes: Negative.   Respiratory: Negative.    Cardiovascular:  Negative.   Gastrointestinal: Negative.   Genitourinary: Negative.   Musculoskeletal: Negative.   Skin: Negative.   Neurological: Negative.   Endo/Heme/Allergies: Negative.   Psychiatric/Behavioral: Negative.       Physical Exam:  weight is 188 lb (85.3 kg). His oral temperature is 97.4 F (36.3 C) (abnormal). His blood pressure is 124/71 and his pulse is 55 (abnormal). His respiration is 18 and oxygen saturation is 97%.   Wt Readings from Last 3 Encounters:  02/03/22 188 lb (85.3 kg)  10/20/21 195 lb 4.8 oz (88.6 kg)  08/04/21 193 lb (87.5 kg)     Physical Exam Vitals reviewed.  HENT:     Head:  Normocephalic and atraumatic.  Eyes:     Pupils: Pupils are equal, round, and reactive to light.  Cardiovascular:     Rate and Rhythm: Normal rate and regular rhythm.     Heart sounds: Normal heart sounds.  Pulmonary:     Effort: Pulmonary effort is normal.     Breath sounds: Normal breath sounds.  Abdominal:     General: Bowel sounds are normal.     Palpations: Abdomen is soft.  Musculoskeletal:        General: No tenderness or deformity. Normal range of motion.     Cervical back: Normal range of motion.  Lymphadenopathy:     Cervical: No cervical adenopathy.  Skin:    General: Skin is warm and dry.     Findings: No erythema or rash.  Neurological:     Mental Status: He is alert and oriented to person, place, and time.  Psychiatric:        Behavior: Behavior normal.        Thought Content: Thought content normal.        Judgment: Judgment normal.      Lab Results  Component Value Date   WBC 2.1 (L) 02/03/2022   HGB 13.7 02/03/2022   HCT 39.9 02/03/2022   MCV 100.0 02/03/2022   PLT 177 02/03/2022     Chemistry      Component Value Date/Time   NA 138 02/03/2022 0847   NA 144 04/26/2016 1117   K 4.3 02/03/2022 0847   K 3.9 04/26/2016 1117   CL 101 02/03/2022 0847   CL 98 04/26/2016 1117   CO2 30 02/03/2022 0847   CO2 18 04/26/2016 1117   BUN 18 02/03/2022 0847   BUN 13 04/26/2016 1117   CREATININE 1.42 (H) 02/03/2022 0847   CREATININE 1.2 04/26/2016 1117      Component Value Date/Time   CALCIUM 9.3 02/03/2022 0847   CALCIUM 9.1 04/26/2016 1117   ALKPHOS 64 02/03/2022 0847   ALKPHOS 99 (H) 04/26/2016 1117   AST 20 02/03/2022 0847   ALT 14 02/03/2022 0847   ALT 22 04/26/2016 1117   BILITOT 0.7 02/03/2022 0847      Impression and Plan: Trevor Cooke is a 81 year old white male. He has myelodysplasia. This is manifested as leukopenia.   I looked at his blood under the microscope.  I did not see any immature myeloid cells.  He has no nucleated red blood  cells.  His platelets look fine and are well granulated.  He had no blasts.  From my point of view, his myelodysplasia is holding nice and steady right now.  I am happy about this.  His quality of life is doing well.  I still think we can follow him up in 6 months.  I just would not imagine that  his blood counts are going to change significantly in that time period.   Volanda Napoleon, MD 11/10/20239:23 AM

## 2022-02-06 ENCOUNTER — Encounter: Payer: Self-pay | Admitting: Family Medicine

## 2022-02-06 ENCOUNTER — Ambulatory Visit (INDEPENDENT_AMBULATORY_CARE_PROVIDER_SITE_OTHER): Payer: Medicare Other | Admitting: Family Medicine

## 2022-02-06 VITALS — BP 106/62 | HR 60 | Temp 97.4°F | Resp 18 | Ht 68.0 in | Wt 188.6 lb

## 2022-02-06 DIAGNOSIS — R944 Abnormal results of kidney function studies: Secondary | ICD-10-CM

## 2022-02-06 DIAGNOSIS — L409 Psoriasis, unspecified: Secondary | ICD-10-CM | POA: Diagnosis not present

## 2022-02-06 NOTE — Assessment & Plan Note (Signed)
Pt concerned about labs done in hematology   Chemistry      Component Value Date/Time   NA 138 02/03/2022 0847   NA 144 04/26/2016 1117   K 4.3 02/03/2022 0847   K 3.9 04/26/2016 1117   CL 101 02/03/2022 0847   CL 98 04/26/2016 1117   CO2 30 02/03/2022 0847   CO2 18 04/26/2016 1117   BUN 18 02/03/2022 0847   BUN 13 04/26/2016 1117   CREATININE 1.42 (H) 02/03/2022 0847   CREATININE 1.2 04/26/2016 1117      Component Value Date/Time   CALCIUM 9.3 02/03/2022 0847   CALCIUM 9.1 04/26/2016 1117   ALKPHOS 64 02/03/2022 0847   ALKPHOS 99 (H) 04/26/2016 1117   AST 20 02/03/2022 0847   ALT 14 02/03/2022 0847   ALT 22 04/26/2016 1117   BILITOT 0.7 02/03/2022 0847     Inc fluid intake  Will recheck when he comes in in 2 months  If still low will refer to nephrology

## 2022-02-06 NOTE — Progress Notes (Addendum)
Subjective:   By signing my name below, I, Carylon Perches, attest that this documentation has been prepared under the direction and in the presence of [name] DO 02/06/2022   Patient ID: Trevor Cooke, male    DOB: May 28, 1940, 81 y.o.   MRN: [id number]  Chief Complaint  Patient presents with   lab review    HPI Patient is in today for an office visit  He reports that his GFR dropped to 49 mL/min. He started testosterone around the time the drop happened and he is not sure whether there is correlation between the two. He denies of any side effects or flank pain. He states that he is now beginning to drink more water.   He is currently being seen at [facility]. He is requesting to be referred to a different location closer to home [facility]).   Past Medical History:  Diagnosis Date   Arthritis    in neck   CVA (cerebral infarction) 1996   GERD (gastroesophageal reflux disease)    History of prediabetes    Hyperlipidemia    Hypertension    MDS (myelodysplastic syndrome), low grade ([place]) 04/26/2016   Neutropenia ([place])    Psoriasis    Thyroid disease    Past Surgical History:  Procedure Laterality Date   CLEFT PALATE REPAIR  1940's   left     Family History  Problem Relation Age of Onset   Hypertension Mother    Hypertension Father    Stroke Father        died from it    Diabetes Sister    Cancer Maternal Uncle        all 4 had cancer. One was brain cancer   Diabetes Maternal Uncle    Colon cancer Neg Hx    Thyroid disease Neg Hx     Social History   Socioeconomic History   Marital status: Married    Spouse name: Not on file   Number of children: 3   Years of education: Not on file   Highest education level: Not on file  Occupational History   Not on file  Tobacco Use   Smoking status: Former   Smokeless tobacco: Never   Tobacco comments:    quit 1970  Vaping Use   Vaping Use: Never used  Substance and Sexual Activity    Alcohol use: Yes    Alcohol/week: 0.0 standard drinks of alcohol    Comment: occasional wine   Drug use: No   Sexual activity: Never  Other Topics Concern   Not on file  Social History Narrative   Not on file   Social Determinants of Health   Financial Resource Strain: Low Risk  (04/07/2021)   Overall Financial Resource Strain (CARDIA)    Difficulty of Paying Living Expenses: Not hard at all  Food Insecurity: No Food Insecurity (08/11/2021)   Hunger Vital Sign    Worried About Running Out of Food in the Last Year: Never true    Ran Out of Food in the Last Year: Never true  Transportation Needs: No Transportation Needs (04/07/2021)   PRAPARE - Hydrologist (Medical): No    Lack of Transportation (Non-Medical): No  Physical Activity: Insufficiently Active (08/11/2021)   Exercise Vital Sign    Days of Exercise per Week: 2 days    Minutes of Exercise per Session: 30 min  Stress: Not on file  Social Connections: Not on file  Intimate Partner Violence: Not on file    Outpatient Medications Prior to Visit  Medication Sig Dispense Refill   aspirin 81 MG tablet Take 81 mg by mouth daily.     calcipotriene-betamethasone (TACLONEX SCALP) external suspension Apply topically daily.     Calcium Carb-Cholecalciferol (CALCIUM 500+D) 500-10 MG-MCG TABS Take 1 tablet by mouth daily.     Cholecalciferol (VITAMIN D) 2000 units CAPS Take 2,000 Units by mouth daily.     clobetasol cream (TEMOVATE) 1.28 % Apply 1 application  topically as needed.     Emollient (EUCERIN) lotion Apply topically as needed for dry skin.     enalapril (VASOTEC) 10 MG tablet Take 1 tablet (10 mg total) by mouth daily. 90 tablet 3   furosemide (LASIX) 20 MG tablet Take 1 tablet (20 mg total) by mouth daily. 90 tablet 3   glucose blood (FREESTYLE PRECISION NEO TEST) test strip Check glucose 1 time daily dx:E11.9 100 each 4   influenza vaccine adjuvanted (FLUAD QUADRIVALENT) 0.5 ML injection  Inject into the muscle. 0.5 mL 0   labetalol (NORMODYNE) 100 MG tablet Take 1 tablet (100 mg total) by mouth 2 (two) times daily. 180 tablet 3   levothyroxine (SYNTHROID) 75 MCG tablet Take 1 tablet (75 mcg total) by mouth daily. 90 tablet 3   lovastatin (MEVACOR) 20 MG tablet Take 1 tablet (20 mg total) by mouth at bedtime. 90 tablet 3   Multiple Vitamins-Minerals (MULTIVITAMIN WITH MINERALS) tablet Take 1 tablet by mouth daily. CitraVit     NIFEdipine (PROCARDIA XL/NIFEDICAL XL) 60 MG 24 hr tablet Take 1 tablet (60 mg total) by mouth daily. 90 tablet 3   omeprazole (PRILOSEC) 20 MG capsule Take 1 capsule by mouth daily.     tadalafil (CIALIS) 20 MG tablet TAKE HALF TO ONE TABLET BY MOUTH EVERY OTHER DAY AS NEEDED FOR ERECTILE DYSFUNCTION 5 tablet 0   testosterone cypionate (DEPOTESTOSTERONE CYPIONATE) 200 MG/ML injection Inject 75 mg into the muscle once a week.     Zinc 50 MG CAPS Take 1 capsule by mouth daily.     No facility-administered medications prior to visit.    No Known Allergies  Review of Systems  Constitutional:  Negative for chills, fever and malaise/fatigue.  HENT:  Negative for congestion and hearing loss.   Eyes:  Negative for discharge.  Respiratory:  Negative for cough, sputum production and shortness of breath.   Cardiovascular:  Negative for chest pain, palpitations and leg swelling.  Gastrointestinal:  Negative for abdominal pain, blood in stool, constipation, diarrhea, heartburn, nausea and vomiting.  Genitourinary:  Negative for dysuria, frequency, hematuria and urgency.  Musculoskeletal:  Negative for back pain, falls and myalgias.       (-) Flank Pain  Skin:  Negative for rash.  Neurological:  Negative for dizziness, sensory change, loss of consciousness, weakness and headaches.  Endo/Heme/Allergies:  Negative for environmental allergies. Does not bruise/bleed easily.  Psychiatric/Behavioral:  Negative for depression and suicidal ideas. The patient is not  nervous/anxious and does not have insomnia.        Objective:    Physical Exam Vitals and nursing note reviewed.  Constitutional:      General: He is not in acute distress.    Appearance: Normal appearance. He is not ill-appearing.  HENT:     Head: Normocephalic and atraumatic.  Cardiovascular:     Rate and Rhythm: Normal rate and regular rhythm.     Heart sounds:     No gallop.  Pulmonary:     Effort: Pulmonary effort is normal. No respiratory distress.     Breath sounds: Normal breath sounds. No wheezing or rales.  Musculoskeletal:        General: No tenderness.  Neurological:     General: No focal deficit present.     Mental Status: He is alert and oriented to person, place, and time.     Cranial Nerves: No cranial nerve deficit.     Sensory: No sensory deficit.     Motor: No weakness.     Coordination: Coordination normal.     Gait: Gait normal.     Deep Tendon Reflexes: Reflexes normal.  Psychiatric:        Mood and Affect: Mood normal.        Behavior: Behavior normal.        Thought Content: Thought content normal.        Judgment: Judgment normal.     BP 106/62 (BP Location: Left Arm, Patient Position: Sitting, Cuff Size: Normal)   Pulse 60   Temp (!) 97.4 F (36.3 C) (Oral)   Resp 18   Ht '5\' 8"'$  (1.727 m)   Wt 188 lb 9.6 oz (85.5 kg)   SpO2 97%   BMI 28.68 kg/m  Wt Readings from Last 3 Encounters:  02/06/22 188 lb 9.6 oz (85.5 kg)  02/03/22 188 lb (85.3 kg)  10/20/21 195 lb 4.8 oz (88.6 kg)    Diabetic Foot Exam - Simple   No data filed    Lab Results  Component Value Date   WBC 2.1 (L) 02/03/2022   HGB 13.7 02/03/2022   HCT 39.9 02/03/2022   PLT 177 02/03/2022   GLUCOSE 174 (H) 02/03/2022   CHOL 109 10/20/2021   TRIG 83.0 10/20/2021   HDL 27.70 (L) 10/20/2021   LDLCALC 65 10/20/2021   ALT 14 02/03/2022   AST 20 02/03/2022   NA 138 02/03/2022   K 4.3 02/03/2022   CL 101 02/03/2022   CREATININE 1.42 (H) 02/03/2022   BUN 18 02/03/2022    CO2 30 02/03/2022   TSH 4.08 10/20/2021   PSA 0.40 03/09/2020   HGBA1C 6.3 04/19/2021   MICROALBUR 0.8 03/17/2019    Lab Results  Component Value Date   TSH 4.08 10/20/2021   Lab Results  Component Value Date   WBC 2.1 (L) 02/03/2022   HGB 13.7 02/03/2022   HCT 39.9 02/03/2022   MCV 100.0 02/03/2022   PLT 177 02/03/2022   Lab Results  Component Value Date   NA 138 02/03/2022   K 4.3 02/03/2022   CO2 30 02/03/2022   GLUCOSE 174 (H) 02/03/2022   BUN 18 02/03/2022   CREATININE 1.42 (H) 02/03/2022   BILITOT 0.7 02/03/2022   ALKPHOS 64 02/03/2022   AST 20 02/03/2022   ALT 14 02/03/2022   PROT 7.5 02/03/2022   ALBUMIN 4.5 02/03/2022   CALCIUM 9.3 02/03/2022   ANIONGAP 7 02/03/2022   GFR 54.10 (L) 10/20/2021   Lab Results  Component Value Date   CHOL 109 10/20/2021   Lab Results  Component Value Date   HDL 27.70 (L) 10/20/2021   Lab Results  Component Value Date   LDLCALC 65 10/20/2021   Lab Results  Component Value Date   TRIG 83.0 10/20/2021   Lab Results  Component Value Date   CHOLHDL 4 10/20/2021   Lab Results  Component Value Date   HGBA1C 6.3 04/19/2021       Assessment & Plan:  Problem List Items Addressed This Visit       Unprioritized   Decreased GFR - Primary    Pt concerned about labs done in hematology   Chemistry      Component Value Date/Time   NA 138 02/03/2022 0847   NA 144 04/26/2016 1117   K 4.3 02/03/2022 0847   K 3.9 04/26/2016 1117   CL 101 02/03/2022 0847   CL 98 04/26/2016 1117   CO2 30 02/03/2022 0847   CO2 18 04/26/2016 1117   BUN 18 02/03/2022 0847   BUN 13 04/26/2016 1117   CREATININE 1.42 (H) 02/03/2022 0847   CREATININE 1.2 04/26/2016 1117      Component Value Date/Time   CALCIUM 9.3 02/03/2022 0847   CALCIUM 9.1 04/26/2016 1117   ALKPHOS 64 02/03/2022 0847   ALKPHOS 99 (H) 04/26/2016 1117   AST 20 02/03/2022 0847   ALT 14 02/03/2022 0847   ALT 22 04/26/2016 1117   BILITOT 0.7 02/03/2022 0847      Inc fluid intake  Will recheck when he comes in in 2 months  If still low will refer to nephrology      Other Visit Diagnoses     Psoriasis       Relevant Orders   Ambulatory referral to Dermatology      No orders of the defined types were placed in this encounter.   IAnn Held, DO, personally preformed the services described in this documentation.  All medical record entries made by the scribe were at my direction and in my presence.  I have reviewed the chart and discharge instructions (if applicable) and agree that the record reflects my personal performance and is accurate and complete. 02/06/2022   I,Amber Collins,acting as a scribe for [name], DO.,have documented all relevant documentation on the behalf of [name], DO,as directed by  [name], DO while in the presence of [name], DO.    [name], DO

## 2022-02-07 ENCOUNTER — Ambulatory Visit (INDEPENDENT_AMBULATORY_CARE_PROVIDER_SITE_OTHER): Payer: Medicare Other | Admitting: Pharmacist

## 2022-02-07 DIAGNOSIS — E785 Hyperlipidemia, unspecified: Secondary | ICD-10-CM

## 2022-02-07 DIAGNOSIS — R7989 Other specified abnormal findings of blood chemistry: Secondary | ICD-10-CM

## 2022-02-07 DIAGNOSIS — I10 Essential (primary) hypertension: Secondary | ICD-10-CM

## 2022-02-07 DIAGNOSIS — E119 Type 2 diabetes mellitus without complications: Secondary | ICD-10-CM

## 2022-02-07 NOTE — Progress Notes (Signed)
Pharmacy Note  02/07/2022 Name: JANDIEL MAGALLANES MRN: [id number] DOB: [birth year]  Subjective: Trevor Cooke is a 81 y.o. year old male who is a primary care patient of Carollee Herter, Alferd Apa, DO. Clinical Pharmacist Practitioner referral was placed to assist with medication management.    Engaged with patient by telephone for follow up visit today.  Hypertension:  Home blood pressure has been 126/76; 110/59; 135/73; 144/76. Patient denies dizziness or syncope.  Hyperlipidemia Reports taking lovastatin as prescribed - filling on time. LDL was 65 when last checked.  Type 2 DM No current pharmacotherapy. Last A1c at goal. Checked blood glucose at home about 2 to 3 times per week. Reports usually 110 to 175. Blood glucose was 174 when checked at Hematology office 02/03/2022. Patient states he had eaten frosted flakes and a banana prior to appointment Osteopenia and low testosterone Using testosterone therapy since 08/2019. BMD has improved since starting SQTR.  Patient however is concerned that recent decrease in eGFR might be related to testosterone therapy.  Last DEXA Scan: 12/02/2021 T-Score femoral neck (left): -2.0 (previously was  -2.27 November 2019)  T-Score total hip (left) : -0.4 (previously was -1.27 November 2019) T-Score lumbar spine: + 0.7 (previously was -1.02 December 2019)   SDOH (Social Determinants of Health) assessments and interventions performed:  SDOH Interventions    Flowsheet Row Office Visit from 02/07/2022 in [place] at [place] [facility] from 08/11/2021 in [place] at [place] [facility] from 04/07/2021 in [place] at [place] [facility] from 10/07/2020 in [place] at [place] Interventions      Food Insecurity Interventions -- Intervention Not Indicated -- --  Housing  Interventions Intervention Not Indicated -- -- --  Transportation Interventions -- -- Intervention Not Indicated --  Utilities Interventions Intervention Not Indicated -- -- --  Financial Strain Interventions Intervention Not Indicated -- Intervention Not Indicated Intervention Not Indicated  Physical Activity Interventions Intervention Not Indicated Other (Comments) Other (Comments)  [active at home and in yard] Other (Comments)  [encouraged patient increase physical activity to goal of 150 minutes per week]  Social Connections Interventions Intervention Not Indicated -- -- --        Objective: Review of patient status, including review of consultants reports, laboratory and other test data, was performed as part of comprehensive evaluation and provision of chronic care management services.   Lab Results  Component Value Date   CREATININE 1.42 (H) 02/03/2022   CREATININE 1.24 10/20/2021   CREATININE 1.19 08/04/2021    Lab Results  Component Value Date   HGBA1C 6.3 04/19/2021       Component Value Date/Time   CHOL 109 10/20/2021 0937   TRIG 83.0 10/20/2021 0937   TRIG 74 04/03/2006 1152   HDL 27.70 (L) 10/20/2021 0937   CHOLHDL 4 10/20/2021 0937   VLDL 16.6 10/20/2021 0937   LDLCALC 65 10/20/2021 0937     Clinical ASCVD: No  The ASCVD Risk score (Arnett DK, et al., 2019) failed to calculate for the following reasons:   The 2019 ASCVD risk score is only valid for ages 4 to 50    BP Readings from Last 3 Encounters:  02/06/22 106/62  02/03/22 124/71  10/20/21 140/84     No Known Allergies  Medications Reviewed Today     Reviewed by Cherre Robins, RPH-CPP (Pharmacist) on 02/07/22 at 1027  Med List Status: <None>   Medication Order Taking? Sig Documenting Provider Last Dose Status Informant  aspirin 81 MG tablet [id number] Yes Take 81 mg by mouth daily. [provider] Taking Active   B Complex-Biotin-FA (B-50 COMPLEX PO) [id number] Yes Take 1 tablet by mouth  daily. [provider] Taking Active   calcipotriene-betamethasone [facility] SCALP) external suspension [id number] Yes Apply topically daily. [provider] Taking Active Self  Calcitriol 3 MCG/GM cream [id number] Yes Apply topically. [provider] Taking Active   Calcium Carb-Cholecalciferol (CALCIUM 500+D) 500-10 MG-MCG TABS [id number] Yes Take 1 tablet by mouth daily. [provider] Taking Active   Cholecalciferol (VITAMIN D) 2000 units CAPS [id number] Yes Take 2,000 Units by mouth daily. [provider] Taking Active   clobetasol cream (TEMOVATE) 0.05 % [id number] No Apply 1 application  topically as needed.  Patient not taking: Reported on 02/07/2022   [provider] Not Taking Active   Emollient (EUCERIN) lotion [id number]  Apply topically as needed for dry skin. [provider]  Active Self  enalapril (VASOTEC) 10 MG tablet [id number] Yes Take 1 tablet (10 mg total) by mouth daily. [name], DO Taking Active            Med Note ([place] Feb 07, 2022 10:26 AM) Dewaine Conger at night  furosemide (LASIX) 20 MG tablet [id number] Yes Take 1 tablet (20 mg total) by mouth daily. Roma Schanz R, DO Taking Active   glucose blood (FREESTYLE PRECISION NEO TEST) test strip [id number]  Check glucose 1 time daily dx:E11.9 Carollee Herter, Alferd Apa, DO  Active   labetalol (NORMODYNE) 100 MG tablet [id number] Yes Take 1 tablet (100 mg total) by mouth 2 (two) times daily. [name], DO Taking Active   levothyroxine (SYNTHROID) 75 MCG tablet [id number] Yes Take 1 tablet (75 mcg total) by mouth daily. [name], DO Taking Active   lovastatin (MEVACOR) 20 MG tablet [id number] Yes Take 1 tablet (20 mg total) by mouth at bedtime. [name], DO Taking Active   Multiple Vitamins-Minerals (MULTIVITAMIN WITH MINERALS) tablet [id number] Yes Take 1 tablet by mouth daily. CitraVit [provider]  Taking Active            Med Note Antony Contras, Adria Dill Aug 11, 2021  2:06 PM)    NIFEdipine (PROCARDIA XL/NIFEDICAL XL) 60 MG 24 hr tablet [id number] Yes Take 1 tablet (60 mg total) by mouth daily. [name], DO Taking Active            Med Note Antony Contras, [place] B   Tue Feb 07, 2022 10:27 AM) Gets at [facility]  omeprazole (PRILOSEC) 20 MG capsule [id number] Yes Take 1 capsule by mouth daily. [provider] Taking Active            Med Note Antony Contras, [facility] B   Tue Feb 07, 2022 10:18 AM) Gets over-the-counter   selenium 50 MCG TABS tablet [id number] Yes Take 50 mcg by mouth daily. [provider] Taking Active   tadalafil (CIALIS) 20 MG tablet [id number] No TAKE HALF TO ONE TABLET BY MOUTH EVERY OTHER DAY AS NEEDED FOR ERECTILE DYSFUNCTION  Patient not taking: Reported on 02/07/2022   [name], DO Not Taking Active   testosterone cypionate (DEPOTESTOSTERONE CYPIONATE) 200 MG/ML injection [id number] Yes Inject 75 mg into the muscle once a week. [provider] Taking Active Self  Med Note Jonathon Jordan Aug 11, 2021  2:07 PM) Gets at compounding pharmacy  Zinc 50 MG CAPS [id number] Yes Take 1 capsule by mouth daily. [provider] Taking Active             Patient Active Problem List   Diagnosis Date Noted   Decreased GFR 02/06/2022   Other neutropenia ([place]) 04/19/2021   Type 2 diabetes mellitus with diabetic peripheral angiopathy without gangrene, without long-term current use of insulin ([place]) 04/19/2021   MDS (myelodysplastic syndrome), low grade ([place]) 04/26/2016   Pharyngoesophageal dysphagia 12/03/2014   Routine health maintenance 08/01/2011   PSORIASIS 05/31/2010   CHEST WALL PAIN, ACUTE 04/12/2010   Hypogonadism male 02/12/2007   Hypothyroidism 10/06/2006   Diabetes mellitus, type 2 ([place]) 10/06/2006   Hyperlipidemia 10/06/2006   Primary hypertension 10/06/2006   GERD 10/06/2006   Diet-controlled diabetes  mellitus ([place]) 10/06/2006   Anemia 10/06/2006     Medication Assistance:  None required.  Patient affirms current coverage meets needs.   Assessment / Plan: Hypertension:  Reviewed blood pressure goal of < 130/80 Continue to check blood pressure 2 to 3 times per week - Notify office if any dizziness or blood pressure < 100/60 Continue current blood pressure medications - enalapril, furosemide, labetalol and nifedipine. If weight and blood pressure continues to trend down, could consider lowering dose of either labetalol or nifedipine.  Hyperlipidemia Reviewed LDL goal of < 70 Continue lovastatin 58m daily  Discussed that he could stop daily aspirin due to age and current conditions. Patient is considering.  Type 2 DM Discussed A1c goal. Patient to see PCP in January. Consider checking urine microalbumin along with Scr. Could consider addition of SGLT2 for renal protection if urine protein is elevated.  Discussed limiting serving size of fruit (especially bananas) and change to cereal without lower added sugar.  Osteopenia and low testosterone Continue SQ testosterone injection and calcium + vitamin D supplement. I have low suspicion that testosterone is cause of change in eGFR. If eGFR continues to decrease could add SGLT2 or hold omeprazole.    Follow Up:  Telephone follow up appointment with care management team member scheduled for:  1 year   TCherre Robins PharmD Clinical Pharmacist [place] [phone]

## 2022-02-07 NOTE — Patient Instructions (Addendum)
Trevor Cooke It was a pleasure speaking with you today.  Below is a summary of your health goals and summary of our recent visit. [place] Kitchen   Hypertension / high Blood pressure:  Blood pressure goal of < 130/80 Continue to check blood pressure 2 to 3 times per week - Notify office if any dizziness or blood pressure < 100/60 Continue current blood pressure medications - enalapril, furosemide, labetalol and nifedipine. Hyperlipidemia LDL goal of < 70 Continue lovastatin '20mg'$  daily  OK to stop daily aspirin  Type 2 Diabetes Discussed A1c goal which is < 7.0% - great job.  Discussed limiting serving size of fruit (especially bananas) and change to cereal without lower added sugar.   Osteopenia and low testosterone Continue testosterone injection and calcium + vitamin D supplement.   Last DEXA Scan: 12/02/2021 - bone density is improving T-Score femoral neck (left): -2.0 (previously was  -2.27 November 2019)  T-Score total hip (left) : -0.4 (previously was -1.27 November 2019) T-Score lumbar spine: + 0.7 (previously was -1.02 December 2019)    As always if you have any questions or concerns especially regarding medications, please feel free to contact me either at the phone number below or with a MyChart message.   Keep up the good work!  Cherre Robins, PharmD Clinical Pharmacist [place] [phone] (direct line)  [phone] (main office number)   Diet changes to improve blood sugar / diabetes:   Increase non-starchy vegetables - carrots, green bean, squash, zucchini, tomatoes, onions, peppers, spinach and other green leafy vegetables, cabbage, lettuce, cucumbers, asparagus, okra (not fried), eggplant  Limit sugar and processed foods (cakes, cereal, cookies, ice cream, crackers and chips)  Increase fresh fruit but limit serving sizes 1/2 cup or about the size of tennis or baseball; 1/2 of a banana is recommended serving size  Limit red meat to  no more than 1-2 times per week (serving size about the size of your palm)  Choose whole grains / lean proteins - whole wheat bread, quinoa, whole grain rice (1/2 cup), fish, chicken, [place]  Avoid sugar and calorie containing beverages - soda, sweet tea and juice.  Choose water or unsweetened tea instead.   Patient verbalizes understanding of instructions and care plan provided today and agrees to view in [place]. Active MyChart status and patient understanding of how to access instructions and care plan via MyChart confirmed with patient.

## 2022-02-20 ENCOUNTER — Other Ambulatory Visit (HOSPITAL_BASED_OUTPATIENT_CLINIC_OR_DEPARTMENT_OTHER): Payer: Self-pay

## 2022-02-20 DIAGNOSIS — Z23 Encounter for immunization: Secondary | ICD-10-CM | POA: Diagnosis not present

## 2022-02-20 MED ORDER — COMIRNATY 30 MCG/0.3ML IM SUSY
PREFILLED_SYRINGE | INTRAMUSCULAR | 0 refills | Status: DC
  Filled 2022-02-20: qty 0.3, 1d supply, fill #0

## 2022-02-21 ENCOUNTER — Other Ambulatory Visit: Payer: Self-pay | Admitting: Family Medicine

## 2022-02-21 DIAGNOSIS — I10 Essential (primary) hypertension: Secondary | ICD-10-CM

## 2022-03-06 ENCOUNTER — Other Ambulatory Visit (HOSPITAL_BASED_OUTPATIENT_CLINIC_OR_DEPARTMENT_OTHER): Payer: Self-pay

## 2022-03-06 MED ORDER — AREXVY 120 MCG/0.5ML IM SUSR
INTRAMUSCULAR | 0 refills | Status: DC
  Filled 2022-03-06: qty 0.5, 1d supply, fill #0

## 2022-04-24 ENCOUNTER — Ambulatory Visit (INDEPENDENT_AMBULATORY_CARE_PROVIDER_SITE_OTHER): Payer: Medicare Other | Admitting: Family Medicine

## 2022-04-24 ENCOUNTER — Encounter: Payer: Self-pay | Admitting: Family Medicine

## 2022-04-24 VITALS — BP 122/86 | HR 65 | Temp 97.8°F | Resp 16 | Ht 68.0 in | Wt 189.4 lb

## 2022-04-24 DIAGNOSIS — N529 Male erectile dysfunction, unspecified: Secondary | ICD-10-CM

## 2022-04-24 DIAGNOSIS — E785 Hyperlipidemia, unspecified: Secondary | ICD-10-CM | POA: Diagnosis not present

## 2022-04-24 DIAGNOSIS — I10 Essential (primary) hypertension: Secondary | ICD-10-CM

## 2022-04-24 DIAGNOSIS — E039 Hypothyroidism, unspecified: Secondary | ICD-10-CM

## 2022-04-24 DIAGNOSIS — E1151 Type 2 diabetes mellitus with diabetic peripheral angiopathy without gangrene: Secondary | ICD-10-CM

## 2022-04-24 DIAGNOSIS — E119 Type 2 diabetes mellitus without complications: Secondary | ICD-10-CM

## 2022-04-24 LAB — CBC WITH DIFFERENTIAL/PLATELET
Basophils Absolute: 0 10*3/uL (ref 0.0–0.1)
Basophils Relative: 0.7 % (ref 0.0–3.0)
Eosinophils Absolute: 0 10*3/uL (ref 0.0–0.7)
Eosinophils Relative: 1.3 % (ref 0.0–5.0)
HCT: 41.3 % (ref 39.0–52.0)
Hemoglobin: 14.3 g/dL (ref 13.0–17.0)
Lymphocytes Relative: 39.5 % (ref 12.0–46.0)
Lymphs Abs: 0.9 10*3/uL (ref 0.7–4.0)
MCHC: 34.6 g/dL (ref 30.0–36.0)
MCV: 100.3 fl — ABNORMAL HIGH (ref 78.0–100.0)
Monocytes Absolute: 0.1 10*3/uL (ref 0.1–1.0)
Monocytes Relative: 4.9 % (ref 3.0–12.0)
Neutro Abs: 1.2 10*3/uL — ABNORMAL LOW (ref 1.4–7.7)
Neutrophils Relative %: 53.6 % (ref 43.0–77.0)
Platelets: 139 10*3/uL — ABNORMAL LOW (ref 150.0–400.0)
RBC: 4.11 Mil/uL — ABNORMAL LOW (ref 4.22–5.81)
RDW: 14.3 % (ref 11.5–15.5)
WBC: 2.2 10*3/uL — ABNORMAL LOW (ref 4.0–10.5)

## 2022-04-24 LAB — COMPREHENSIVE METABOLIC PANEL
ALT: 16 U/L (ref 0–53)
AST: 21 U/L (ref 0–37)
Albumin: 4.2 g/dL (ref 3.5–5.2)
Alkaline Phosphatase: 56 U/L (ref 39–117)
BUN: 17 mg/dL (ref 6–23)
CO2: 30 mEq/L (ref 19–32)
Calcium: 8.7 mg/dL (ref 8.4–10.5)
Chloride: 99 mEq/L (ref 96–112)
Creatinine, Ser: 1.23 mg/dL (ref 0.40–1.50)
GFR: 54.44 mL/min — ABNORMAL LOW (ref >60.00)
Glucose, Bld: 111 mg/dL — ABNORMAL HIGH (ref 70–99)
Potassium: 4.5 mEq/L (ref 3.5–5.1)
Sodium: 137 mEq/L (ref 135–145)
Total Bilirubin: 0.7 mg/dL (ref 0.2–1.2)
Total Protein: 6.8 g/dL (ref 6.0–8.3)

## 2022-04-24 LAB — HEMOGLOBIN A1C: Hgb A1c MFr Bld: 6.2 % (ref 4.6–6.5)

## 2022-04-24 LAB — TSH: TSH: 3.64 u[IU]/mL (ref 0.35–5.50)

## 2022-04-24 LAB — MICROALBUMIN / CREATININE URINE RATIO
Creatinine,U: 184.4 mg/dL
Microalb Creat Ratio: 0.4 mg/g (ref 0.0–30.0)
Microalb, Ur: 0.8 mg/dL (ref 0.0–1.9)

## 2022-04-24 LAB — LIPID PANEL
Cholesterol: 95 mg/dL (ref 0–200)
HDL: 28.3 mg/dL — ABNORMAL LOW (ref >39.00)
LDL Cholesterol: 54 mg/dL (ref 0–99)
NonHDL: 66.83
Total CHOL/HDL Ratio: 3
Triglycerides: 62 mg/dL (ref 0.0–149.0)
VLDL: 12.4 mg/dL (ref 0.0–40.0)

## 2022-04-24 MED ORDER — ENALAPRIL MALEATE 10 MG PO TABS: 10.0000 mg | ORAL_TABLET | Freq: Every day | ORAL | 3 refills | Status: DC

## 2022-04-24 MED ORDER — LABETALOL HCL 100 MG PO TABS: 100.0000 mg | ORAL_TABLET | Freq: Two times a day (BID) | ORAL | 3 refills | Status: DC

## 2022-04-24 MED ORDER — FREESTYLE PRECISION NEO TEST VI STRP: ORAL_STRIP | 4 refills | Status: DC

## 2022-04-24 MED ORDER — LEVOTHYROXINE SODIUM 75 MCG PO TABS: 75.0000 ug | ORAL_TABLET | Freq: Every day | ORAL | 3 refills | Status: DC

## 2022-04-24 MED ORDER — TADALAFIL 20 MG PO TABS: ORAL_TABLET | ORAL | 1 refills | Status: DC

## 2022-04-24 MED ORDER — LOVASTATIN 20 MG PO TABS: 20.0000 mg | ORAL_TABLET | Freq: Every day | ORAL | 3 refills | Status: DC

## 2022-04-24 MED ORDER — NIFEDIPINE ER OSMOTIC RELEASE 60 MG PO TB24: 60.0000 mg | ORAL_TABLET | Freq: Every day | ORAL | 3 refills | Status: DC

## 2022-04-24 NOTE — Assessment & Plan Note (Signed)
hgba1c to be checked, minimize simple carbs. Increase exercise as tolerated. Continue current meds  

## 2022-04-24 NOTE — Assessment & Plan Note (Signed)
Check labs  Con't synthroid 

## 2022-04-24 NOTE — Patient Instructions (Addendum)
Carbohyd                                                                                                                                        te Counting for Diabetes Mellitus, Adult Carbohydrate counting is a method of keeping track of how many carbohydrates you eat. Eating carbohydrates increases the amount of sugar (glucose) in the blood. Counting how many carbohydrates you eat improves how well you manage your blood glucose. This, in turn, helps you manage your diabetes. Carbohydrates are measured in grams (g) per serving. It is important to know how many carbohydrates (in grams or by serving size) you can have in each meal. This is different for every person. A dietitian can help you make a meal plan and calculate how many carbohydrates you should have at each meal and snack. What foods contain carbohydrates? Carbohydrates are found in the following foods: Grains, such as breads and cereals. Dried beans and soy products. Starchy vegetables, such as potatoes, peas, and corn. Fruit and fruit juices. Milk and yogurt. Sweets and snack foods, such as cake, cookies, candy, chips, and soft drinks. How do I count carbohydrates in foods? There are two ways to count carbohydrates in food. You can read food labels or learn standard serving sizes of foods. You can use either of these methods or a combination of both. Using the Nutrition Facts label The Nutrition Facts list is included on the labels of almost all packaged foods and beverages in the [place]. It includes: The serving size. Information about nutrients in each serving, including the grams of carbohydrate per serving. To use the Nutrition Facts, decide how many servings you will have. Then, multiply the number of servings by the number of carbohydrates per serving. The resulting number is the total grams  of carbohydrates that you will be having. Learning the standard serving sizes of foods When you eat carbohydrate foods that are not packaged or do not include Nutrition Facts on the label, you need to measure the servings in order to count the grams of carbohydrates. Measure the foods that you will eat with a food scale or measuring cup, if needed. Decide how many standard-size servings you will eat. Multiply the number of servings by 15. For foods that contain carbohydrates, one serving equals 15 g of carbohydrates. For example, if you eat 2 cups or 10 oz (300 g) of strawberries, you will have eaten 2 servings and 30 g of carbohydrates (2 servings x 15 g = 30 g). For foods that have more than one food mixed, such as soups and casseroles, you must count the carbohydrates in each food that is included. The following list contains standard serving sizes of common carbohydrate-rich foods. Each of these servings has about 15 g of carbohydrates: 1 slice of bread. 1 six-inch (15 cm) tortilla. ? cup or 2 oz (53 g) cooked rice or pasta.  cup or 3 oz (85 g) cooked or canned, drained and rinsed beans or lentils.  cup or 3 oz (85 g) starchy vegetable, such as peas, corn, or squash.  cup or 4 oz (120 g) hot cereal.  cup or 3 oz (85 g) boiled or mashed potatoes, or  or 3 oz (85 g) of a large baked potato.  cup or 4 fl oz (118 mL) fruit juice. 1 cup or 8 fl oz (237 mL) milk. 1 small or 4 oz (106 g) apple.  or 2 oz (63 g) of a medium banana. 1 cup or 5 oz (150 g) strawberries. 3 cups or 1 oz (28.3 g) popped popcorn. What is an example of carbohydrate counting? To calculate the grams of carbohydrates in this sample meal, follow the steps shown below. Sample meal 3 oz (85 g) chicken breast. ? cup or 4 oz (106 g) brown rice.  cup or 3 oz (85 g) corn. 1 cup or 8 fl oz (237 mL) milk. 1 cup or 5 oz (150 g) strawberries with sugar-free whipped topping. Carbohydrate calculation Identify the foods  that contain carbohydrates: Rice. Corn. Milk. Strawberries. Calculate how many servings you have of each food: 2 servings rice. 1 serving corn. 1 serving milk. 1 serving strawberries. Multiply each number of servings by 15 g: 2 servings rice x 15 g = 30 g. 1 serving corn x 15 g = 15 g. 1 serving milk x 15 g = 15 g. 1 serving strawberries x 15 g = 15 g. Add together all of the amounts to find the total grams of carbohydrates eaten: 30 g + 15 g + 15 g + 15 g = 75 g of carbohydrates total. What are tips for following this plan? Shopping Develop a meal plan and then make a shopping list. Buy fresh and frozen vegetables, fresh and frozen fruit, dairy, eggs, beans, lentils, and whole grains. Look at food labels. Choose foods that have more fiber and less sugar. Avoid processed foods and foods with added sugars. Meal planning Aim to have the same number of grams of carbohydrates at each meal and for each snack time. Plan to have regular, balanced meals and snacks. Where to find more information American Diabetes Association: diabetes.org Centers for Disease Control and Prevention: StoreMirror.com.cy Academy of Nutrition and Dietetics: eatright.org Association of Diabetes Care & Education Specialists: diabeteseducator.org Summary Carbohydrate counting is a method of keeping track of how many carbohydrates you eat. Eating carbohydrates increases the amount of sugar (glucose) in your blood. Counting how many carbohydrates you eat improves how well you manage your blood glucose. This helps you manage your diabetes. A dietitian can help you make a meal plan and calculate how many carbohydrates you should have at each meal and snack. This information is not intended to replace advice given to you by your health care provider. Make sure you discuss any questions you have with your health care provider. Document Revised: 10/15/2019 Document Reviewed: 10/15/2019 Elsevier Patient Education  [place].

## 2022-04-24 NOTE — Progress Notes (Signed)
Established Patient Office Visit  Subjective   Patient ID: Trevor Cooke, male    DOB: 1940/12/23  Age: 82 y.o. MRN: [id number]  Chief Complaint  Patient presents with   Hypothyroidism   Hypertension   Hyperlipidemia   Follow-up    HPI Pt is here f/u dm, chol , bp and thyroid    overall pt is feeling good   HYPERTENSION  Blood pressure range-not checking   Chest pain- no      Dyspnea- no Lightheadedness- no   Edema- no Other side effects - no   Medication compliance: good Low salt diet- yes  DIABETES  Blood Sugar ranges-low 100s   Polyuria- no New Visual problems- no Hypoglycemic symptoms- no Other side effects-no Medication compliance - good Last eye exam- due soon  Foot exam- today  HYPERLIPIDEMIA  Medication compliance- good  RUQ pain- no  Muscle aches- no Other side effects-no    Patient Active Problem List   Diagnosis Date Noted   Decreased GFR 02/06/2022   Other neutropenia ([place]) 04/19/2021   Type 2 diabetes mellitus with diabetic peripheral angiopathy without gangrene, without long-term current use of insulin ([place]) 04/19/2021   MDS (myelodysplastic syndrome), low grade ([place]) 04/26/2016   Pharyngoesophageal dysphagia 12/03/2014   Routine health maintenance 08/01/2011   PSORIASIS 05/31/2010   CHEST WALL PAIN, ACUTE 04/12/2010   Hypogonadism male 02/12/2007   Hypothyroidism 10/06/2006   Diabetes mellitus, type 2 ([place]) 10/06/2006   Hyperlipidemia 10/06/2006   Primary hypertension 10/06/2006   GERD 10/06/2006   Diet-controlled diabetes mellitus ([place]) 10/06/2006   Anemia 10/06/2006   Past Medical History:  Diagnosis Date   Arthritis    in neck   CVA (cerebral infarction) 1996   GERD (gastroesophageal reflux disease)    History of prediabetes    Hyperlipidemia    Hypertension    MDS (myelodysplastic syndrome), low grade ([place]) 04/26/2016   Neutropenia ([place])    Psoriasis    Thyroid disease    Past Surgical History:  Procedure  Laterality Date   CLEFT PALATE REPAIR  1940's   left    Social History   Tobacco Use   Smoking status: Former   Smokeless tobacco: Never   Tobacco comments:    quit 1970  Vaping Use   Vaping Use: Never used  Substance Use Topics   Alcohol use: Yes    Alcohol/week: 0.0 standard drinks of alcohol    Comment: occasional wine   Drug use: No   Social History   Socioeconomic History   Marital status: Married    Spouse name: Not on file   Number of children: 3   Years of education: Not on file   Highest education level: Not on file  Occupational History   Not on file  Tobacco Use   Smoking status: Former   Smokeless tobacco: Never   Tobacco comments:    quit 1970  Vaping Use   Vaping Use: Never used  Substance and Sexual Activity   Alcohol use: Yes    Alcohol/week: 0.0 standard drinks of alcohol    Comment: occasional wine   Drug use: No   Sexual activity: Never  Other Topics Concern   Not on file  Social History Narrative   Not on file   Social Determinants of Health   Financial Resource Strain: Low Risk  (02/07/2022)   Overall Financial Resource Strain (CARDIA)    Difficulty of Paying Living Expenses: Not hard at all  Food Insecurity: No Food Insecurity (  08/11/2021)   Hunger Vital Sign    Worried About Running Out of Food in the Last Year: Never true    [place] in the Last Year: Never true  Transportation Needs: No Transportation Needs (04/07/2021)   PRAPARE - Hydrologist (Medical): No    Lack of Transportation (Non-Medical): No  Physical Activity: Insufficiently Active (02/07/2022)   Exercise Vital Sign    Days of Exercise per Week: 4 days    Minutes of Exercise per Session: 30 min  Stress: Not on file  Social Connections: Socially Integrated (02/07/2022)   Social Connection and Isolation Panel [NHANES]    Frequency of Communication with Friends and Family: More than three times a week    Frequency of Social Gatherings  with Friends and Family: Once a week    Attends Religious Services: More than 4 times per year    Active Member of [employer] or Organizations: Yes    Attends Music therapist: More than 4 times per year    Marital Status: Married  Human resources officer Violence: Not on file   Family Status  Relation Name Status   Mother  Deceased   Father  Deceased   Sister carolyn Deceased   Sister emma Alive   Administrator  (Not Specified)   Mat Uncle Jerly Alive   Neg Hx  (Not Specified)   Family History  Problem Relation Age of Onset   Hypertension Mother    Hypertension Father    Stroke Father        died from it    Diabetes Sister    Pancreatic cancer Sister    Cancer Maternal Uncle        all 4 had cancer. One was brain cancer   Diabetes Maternal Uncle    Colon cancer Neg Hx    Thyroid disease Neg Hx    No Known Allergies    Review of Systems  Constitutional:  Negative for fever and malaise/fatigue.  HENT:  Negative for congestion.   Eyes:  Negative for blurred vision.  Respiratory:  Negative for cough and shortness of breath.   Cardiovascular:  Negative for chest pain, palpitations and leg swelling.  Gastrointestinal:  Negative for vomiting.  Musculoskeletal:  Negative for back pain.  Skin:  Negative for rash.  Neurological:  Negative for loss of consciousness and headaches.      Objective:     BP 122/86 (BP Location: Left Arm, Patient Position: Sitting, Cuff Size: Normal)   Pulse 65   Temp 97.8 F (36.6 C) (Oral)   Resp 16   Ht '5\' 8"'$  (1.727 m)   Wt 189 lb 6.4 oz (85.9 kg)   SpO2 97%   BMI 28.80 kg/m  BP Readings from Last 3 Encounters:  04/24/22 122/86  02/06/22 106/62  02/03/22 124/71   Wt Readings from Last 3 Encounters:  04/24/22 189 lb 6.4 oz (85.9 kg)  02/06/22 188 lb 9.6 oz (85.5 kg)  02/03/22 188 lb (85.3 kg)   SpO2 Readings from Last 3 Encounters:  04/24/22 97%  02/06/22 97%  02/03/22 97%      Physical Exam Vitals and nursing note  reviewed.  Constitutional:      Appearance: He is well-developed.  HENT:     Head: Normocephalic and atraumatic.  Eyes:     Pupils: Pupils are equal, round, and reactive to light.  Neck:     Thyroid: No thyromegaly.  Cardiovascular:  Rate and Rhythm: Normal rate and regular rhythm.     Heart sounds: No murmur heard. Pulmonary:     Effort: Pulmonary effort is normal. No respiratory distress.     Breath sounds: Normal breath sounds. No wheezing or rales.  Chest:     Chest wall: No tenderness.  Musculoskeletal:     Cervical back: Normal range of motion and neck supple.     Right hip: Tenderness present. Normal range of motion. Normal strength.     Left hip: Tenderness present. Normal range of motion. Normal strength.     Right foot: Bony tenderness present. No swelling.     Left foot: Bony tenderness present. No swelling.  Skin:    General: Skin is warm and dry.  Neurological:     General: No focal deficit present.     Mental Status: He is alert and oriented to person, place, and time.  Psychiatric:        Behavior: Behavior normal.        Thought Content: Thought content normal.        Judgment: Judgment normal.     No results found for any visits on 04/24/22.  Last CBC Lab Results  Component Value Date   WBC 2.1 (L) 02/03/2022   HGB 13.7 02/03/2022   HCT 39.9 02/03/2022   MCV 100.0 02/03/2022   MCH 34.3 (H) 02/03/2022   RDW 13.4 02/03/2022   PLT 177 27/08/2374   Last metabolic panel Lab Results  Component Value Date   GLUCOSE 174 (H) 02/03/2022   NA 138 02/03/2022   K 4.3 02/03/2022   CL 101 02/03/2022   CO2 30 02/03/2022   BUN 18 02/03/2022   CREATININE 1.42 (H) 02/03/2022   GFRNONAA 49 (L) 02/03/2022   CALCIUM 9.3 02/03/2022   PROT 7.5 02/03/2022   ALBUMIN 4.5 02/03/2022   BILITOT 0.7 02/03/2022   ALKPHOS 64 02/03/2022   AST 20 02/03/2022   ALT 14 02/03/2022   ANIONGAP 7 02/03/2022   Last lipids Lab Results  Component Value Date   CHOL 109  10/20/2021   HDL 27.70 (L) 10/20/2021   LDLCALC 65 10/20/2021   TRIG 83.0 10/20/2021   CHOLHDL 4 10/20/2021   Last hemoglobin A1c Lab Results  Component Value Date   HGBA1C 6.3 04/19/2021   Last thyroid functions Lab Results  Component Value Date   TSH 4.08 10/20/2021   Last vitamin D No results found for: "25OHVITD2", "25OHVITD3", "VD25OH" Last vitamin B12 and Folate Lab Results  Component Value Date   VITAMINB12 359 04/08/2007   FOLATE > 20.0 ng/mL 04/08/2007    The ASCVD Risk score (Arnett DK, et al., 2019) failed to calculate for the following reasons:   The 2019 ASCVD risk score is only valid for ages 14 to 23    Assessment & Plan:   Problem List Items Addressed This Visit       Unprioritized   Type 2 diabetes mellitus with diabetic peripheral angiopathy without gangrene, without long-term current use of insulin ([place]) - Primary    hgba1c to be checked , minimize simple carbs. Increase exercise as tolerated. Continue current meds       Relevant Medications   enalapril (VASOTEC) 10 MG tablet   labetalol (NORMODYNE) 100 MG tablet   lovastatin (MEVACOR) 20 MG tablet   NIFEdipine (PROCARDIA XL/NIFEDICAL XL) 60 MG 24 hr tablet   tadalafil (CIALIS) 20 MG tablet   Hypothyroidism    Check labs  Con't synthroid  Relevant Medications   labetalol (NORMODYNE) 100 MG tablet   levothyroxine (SYNTHROID) 75 MCG tablet   Other Relevant Orders   TSH   Hyperlipidemia   Relevant Medications   enalapril (VASOTEC) 10 MG tablet   labetalol (NORMODYNE) 100 MG tablet   lovastatin (MEVACOR) 20 MG tablet   NIFEdipine (PROCARDIA XL/NIFEDICAL XL) 60 MG 24 hr tablet   tadalafil (CIALIS) 20 MG tablet   Other Relevant Orders   Comprehensive metabolic panel   Lipid panel   Diet-controlled diabetes mellitus (HCC)   Relevant Medications   enalapril (VASOTEC) 10 MG tablet   glucose blood (FREESTYLE PRECISION NEO TEST) test strip   lovastatin (MEVACOR) 20 MG tablet   Other  Relevant Orders   Comprehensive metabolic panel   Hemoglobin A1c   Microalbumin / creatinine urine ratio   Other Visit Diagnoses     Essential hypertension       Relevant Medications   enalapril (VASOTEC) 10 MG tablet   labetalol (NORMODYNE) 100 MG tablet   lovastatin (MEVACOR) 20 MG tablet   NIFEdipine (PROCARDIA XL/NIFEDICAL XL) 60 MG 24 hr tablet   tadalafil (CIALIS) 20 MG tablet   Other Relevant Orders   CBC with Differential/Platelet   Comprehensive metabolic panel   Lipid panel   TSH   Erectile dysfunction, unspecified erectile dysfunction type       Relevant Medications   tadalafil (CIALIS) 20 MG tablet       Return in about 6 months (around 10/23/2022), or if symptoms worsen or fail to improve, for hypertension, hyperlipidemia, diabetes II.    [name], DO

## 2022-07-18 DIAGNOSIS — H2513 Age-related nuclear cataract, bilateral: Secondary | ICD-10-CM | POA: Diagnosis not present

## 2022-07-18 DIAGNOSIS — E119 Type 2 diabetes mellitus without complications: Secondary | ICD-10-CM | POA: Diagnosis not present

## 2022-08-04 ENCOUNTER — Encounter: Payer: Self-pay | Admitting: Hematology & Oncology

## 2022-08-04 ENCOUNTER — Other Ambulatory Visit: Payer: Self-pay

## 2022-08-04 ENCOUNTER — Inpatient Hospital Stay: Payer: Medicare Other | Attending: Hematology & Oncology

## 2022-08-04 ENCOUNTER — Inpatient Hospital Stay (HOSPITAL_BASED_OUTPATIENT_CLINIC_OR_DEPARTMENT_OTHER): Payer: Medicare Other | Admitting: Hematology & Oncology

## 2022-08-04 VITALS — BP 140/74 | HR 54 | Temp 97.5°F | Resp 16 | Ht 68.0 in | Wt 189.0 lb

## 2022-08-04 DIAGNOSIS — M7989 Other specified soft tissue disorders: Secondary | ICD-10-CM | POA: Diagnosis not present

## 2022-08-04 DIAGNOSIS — Z79890 Hormone replacement therapy (postmenopausal): Secondary | ICD-10-CM | POA: Insufficient documentation

## 2022-08-04 DIAGNOSIS — D46Z Other myelodysplastic syndromes: Secondary | ICD-10-CM

## 2022-08-04 DIAGNOSIS — D469 Myelodysplastic syndrome, unspecified: Secondary | ICD-10-CM | POA: Diagnosis not present

## 2022-08-04 DIAGNOSIS — D72819 Decreased white blood cell count, unspecified: Secondary | ICD-10-CM | POA: Insufficient documentation

## 2022-08-04 DIAGNOSIS — Z79899 Other long term (current) drug therapy: Secondary | ICD-10-CM | POA: Insufficient documentation

## 2022-08-04 LAB — CMP (CANCER CENTER ONLY)
ALT: 16 U/L (ref 0–44)
AST: 23 U/L (ref 15–41)
Albumin: 4.6 g/dL (ref 3.5–5.0)
Alkaline Phosphatase: 58 U/L (ref 38–126)
Anion gap: 9 (ref 5–15)
BUN: 18 mg/dL (ref 8–23)
CO2: 33 mmol/L — ABNORMAL HIGH (ref 22–32)
Calcium: 9.8 mg/dL (ref 8.9–10.3)
Chloride: 98 mmol/L (ref 98–111)
Creatinine: 1.35 mg/dL — ABNORMAL HIGH (ref 0.61–1.24)
GFR, Estimated: 52 mL/min — ABNORMAL LOW (ref >60)
Glucose, Bld: 154 mg/dL — ABNORMAL HIGH (ref 70–99)
Potassium: 4.4 mmol/L (ref 3.5–5.1)
Sodium: 140 mmol/L (ref 135–145)
Total Bilirubin: 0.8 mg/dL (ref 0.3–1.2)
Total Protein: 7.5 g/dL (ref 6.5–8.1)

## 2022-08-04 LAB — CBC WITH DIFFERENTIAL (CANCER CENTER ONLY)
Abs Immature Granulocytes: 0.02 10*3/uL (ref 0.00–0.07)
Basophils Absolute: 0 10*3/uL (ref 0.0–0.1)
Basophils Relative: 0 %
Eosinophils Absolute: 0 10*3/uL (ref 0.0–0.5)
Eosinophils Relative: 2 %
HCT: 40.9 % (ref 39.0–52.0)
Hemoglobin: 14.1 g/dL (ref 13.0–17.0)
Immature Granulocytes: 1 %
Lymphocytes Relative: 44 %
Lymphs Abs: 1 10*3/uL (ref 0.7–4.0)
MCH: 34.2 pg — ABNORMAL HIGH (ref 26.0–34.0)
MCHC: 34.5 g/dL (ref 30.0–36.0)
MCV: 99.3 fL (ref 80.0–100.0)
Monocytes Absolute: 0.2 10*3/uL (ref 0.1–1.0)
Monocytes Relative: 6 %
Neutro Abs: 1.1 10*3/uL — ABNORMAL LOW (ref 1.7–7.7)
Neutrophils Relative %: 47 %
Platelet Count: 147 10*3/uL — ABNORMAL LOW (ref 150–400)
RBC: 4.12 MIL/uL — ABNORMAL LOW (ref 4.22–5.81)
RDW: 13.8 % (ref 11.5–15.5)
WBC Count: 2.4 10*3/uL — ABNORMAL LOW (ref 4.0–10.5)
nRBC: 0 % (ref 0.0–0.2)

## 2022-08-04 LAB — RETICULOCYTES
Immature Retic Fract: 10.3 % (ref 2.3–15.9)
RBC.: 4.1 MIL/uL — ABNORMAL LOW (ref 4.22–5.81)
Retic Count, Absolute: 80.8 10*3/uL (ref 19.0–186.0)
Retic Ct Pct: 2 % (ref 0.4–3.1)

## 2022-08-04 LAB — IRON AND IRON BINDING CAPACITY (CC-WL,HP ONLY)
Iron: 93 ug/dL (ref 45–182)
Saturation Ratios: 28 % (ref 17.9–39.5)
TIBC: 333 ug/dL (ref 250–450)
UIBC: 240 ug/dL (ref 117–376)

## 2022-08-04 LAB — SAVE SMEAR(SSMR), FOR PROVIDER SLIDE REVIEW

## 2022-08-04 LAB — FERRITIN: Ferritin: 54 ng/mL (ref 24–336)

## 2022-08-04 NOTE — Progress Notes (Signed)
Hematology and Oncology Follow Up Visit  Trevor Cooke [id number] February 11, 1941 82 y.o. 08/04/2022   Principle Diagnosis:  Myelodysplastic syndrome-low-grade (IPSS-R score = 1) - tp53+  Current Therapy:   Observation     Interim History:  [name] is back for follow-up.  We see him every 6 months.  Overall, he has been doing quite well.  He has had no problems since we last saw him.  There were no issues over the Holiday season.  He had no issues over the Winter.  He has been traveling.  He and his wife like to go down to [place].  He will have cataract surgery in June.  I do not see a problem him having cataract surgery.  When we last saw him back in November, his ferritin was 64 with an iron saturation of 23%.     His appetite is good.  He has had no problems with nausea or vomiting.  There is been no change in bowel or bladder habits.  He has had no cough or shortness of breath.  There have been no rashes.  He has limited swelling in his legs.  I know he is on Lasix.  I do not know if he really is taking Lasix.  From my point of view, I think his overall performance status is probably ECOG 1.    Medications:  Current Outpatient Medications:    B Complex-Biotin-FA (B-50 COMPLEX PO), Take 1 tablet by mouth daily., Disp: , Rfl:    calcipotriene-betamethasone (TACLONEX SCALP) external suspension, Apply topically daily., Disp: , Rfl:    Calcitriol 3 MCG/GM cream, Apply topically., Disp: , Rfl:    Calcium Carb-Cholecalciferol (CALCIUM 500+D) 500-10 MG-MCG TABS, Take 1 tablet by mouth daily., Disp: , Rfl:    Cholecalciferol (VITAMIN D) 2000 units CAPS, Take 2,000 Units by mouth daily., Disp: , Rfl:    clobetasol cream (TEMOVATE) 0.05 %, Apply 1 application  topically as needed. (Patient not taking: Reported on 02/07/2022), Disp: , Rfl:    Emollient (EUCERIN) lotion, Apply topically as needed for dry skin., Disp: , Rfl:    enalapril (VASOTEC) 10 MG tablet, Take 1 tablet (10  mg total) by mouth daily., Disp: 90 tablet, Rfl: 3   glucose blood (FREESTYLE PRECISION NEO TEST) test strip, Check glucose 1 time daily dx:E11.9, Disp: 100 each, Rfl: 4   labetalol (NORMODYNE) 100 MG tablet, Take 1 tablet (100 mg total) by mouth 2 (two) times daily., Disp: 180 tablet, Rfl: 3   levothyroxine (SYNTHROID) 75 MCG tablet, Take 1 tablet (75 mcg total) by mouth daily., Disp: 90 tablet, Rfl: 3   lovastatin (MEVACOR) 20 MG tablet, Take 1 tablet (20 mg total) by mouth at bedtime., Disp: 90 tablet, Rfl: 3   Multiple Vitamins-Minerals (MULTIVITAMIN WITH MINERALS) tablet, Take 1 tablet by mouth daily. CitraVit, Disp: , Rfl:    NIFEdipine (PROCARDIA XL/NIFEDICAL XL) 60 MG 24 hr tablet, Take 1 tablet (60 mg total) by mouth daily., Disp: 90 tablet, Rfl: 3   omeprazole (PRILOSEC) 20 MG capsule, Take 1 capsule by mouth daily., Disp: , Rfl:    Testosterone Enanthate (XYOSTED) 75 MG/0.5ML SOAJ, Inject into the skin., Disp: , Rfl:    Zinc 50 MG CAPS, Take 1 capsule by mouth daily., Disp: , Rfl:   Allergies: No Known Allergies  Past Medical History, Surgical history, Social history, and Family History were reviewed and updated.  Review of Systems: Review of Systems  Constitutional: Negative.   HENT: Negative.  Eyes: Negative.   Respiratory: Negative.    Cardiovascular: Negative.   Gastrointestinal: Negative.   Genitourinary: Negative.   Musculoskeletal: Negative.   Skin: Negative.   Neurological: Negative.   Endo/Heme/Allergies: Negative.   Psychiatric/Behavioral: Negative.       Physical Exam:  height is 5\' 8"  (1.727 m) and weight is 189 lb (85.7 kg). His oral temperature is 97.5 F (36.4 C) (abnormal). His blood pressure is 140/74 (abnormal) and his pulse is 54 (abnormal). His respiration is 16 and oxygen saturation is 95%.   Wt Readings from Last 3 Encounters:  08/04/22 189 lb (85.7 kg)  04/24/22 189 lb 6.4 oz (85.9 kg)  02/06/22 188 lb 9.6 oz (85.5 kg)     Physical  Exam Vitals reviewed.  HENT:     Head: Normocephalic and atraumatic.  Eyes:     Pupils: Pupils are equal, round, and reactive to light.  Cardiovascular:     Rate and Rhythm: Normal rate and regular rhythm.     Heart sounds: Normal heart sounds.  Pulmonary:     Effort: Pulmonary effort is normal.     Breath sounds: Normal breath sounds.  Abdominal:     General: Bowel sounds are normal.     Palpations: Abdomen is soft.  Musculoskeletal:        General: No tenderness or deformity. Normal range of motion.     Cervical back: Normal range of motion.  Lymphadenopathy:     Cervical: No cervical adenopathy.  Skin:    General: Skin is warm and dry.     Findings: No erythema or rash.  Neurological:     Mental Status: He is alert and oriented to person, place, and time.  Psychiatric:        Behavior: Behavior normal.        Thought Content: Thought content normal.        Judgment: Judgment normal.      Lab Results  Component Value Date   WBC 2.4 (L) 08/04/2022   HGB 14.1 08/04/2022   HCT 40.9 08/04/2022   MCV 99.3 08/04/2022   PLT 147 (L) 08/04/2022     Chemistry      Component Value Date/Time   NA 140 08/04/2022 0842   NA 144 04/26/2016 1117   K 4.4 08/04/2022 0842   K 3.9 04/26/2016 1117   CL 98 08/04/2022 0842   CL 98 04/26/2016 1117   CO2 33 (H) 08/04/2022 0842   CO2 18 04/26/2016 1117   BUN 18 08/04/2022 0842   BUN 13 04/26/2016 1117   CREATININE 1.35 (H) 08/04/2022 0842   CREATININE 1.2 04/26/2016 1117      Component Value Date/Time   CALCIUM 9.8 08/04/2022 0842   CALCIUM 9.1 04/26/2016 1117   ALKPHOS 58 08/04/2022 0842   ALKPHOS 99 (H) 04/26/2016 1117   AST 23 08/04/2022 0842   ALT 16 08/04/2022 0842   ALT 22 04/26/2016 1117   BILITOT 0.8 08/04/2022 0842      Impression and Plan: [name] is a 82 year old white male. He has myelodysplasia. This is manifested as leukopenia.   I looked at his blood under the microscope.  I did not see any immature  myeloid cells.  He has no nucleated red blood cells.  His platelets look fine and are well granulated.  He had no blasts.  From my point of view, his myelodysplasia is holding nice and steady right now.  I am happy about this.  His quality of life  is doing well.  I still think we can follow him up in 6 months.  I just would not imagine that his blood counts are going to change significantly in that time period.   Josph Macho, MD 5/10/20249:58 AM

## 2022-08-07 DIAGNOSIS — E291 Testicular hypofunction: Secondary | ICD-10-CM | POA: Diagnosis not present

## 2022-08-14 ENCOUNTER — Other Ambulatory Visit: Payer: Self-pay | Admitting: Family Medicine

## 2022-08-14 ENCOUNTER — Telehealth: Payer: Self-pay | Admitting: Family Medicine

## 2022-08-14 DIAGNOSIS — E119 Type 2 diabetes mellitus without complications: Secondary | ICD-10-CM

## 2022-08-14 DIAGNOSIS — E039 Hypothyroidism, unspecified: Secondary | ICD-10-CM

## 2022-08-14 DIAGNOSIS — D46Z Other myelodysplastic syndromes: Secondary | ICD-10-CM

## 2022-08-14 DIAGNOSIS — I10 Essential (primary) hypertension: Secondary | ICD-10-CM

## 2022-08-14 DIAGNOSIS — E785 Hyperlipidemia, unspecified: Secondary | ICD-10-CM

## 2022-08-14 NOTE — Telephone Encounter (Signed)
Patient states he was supposed to get lab work done 3 months after his Jan appointment with Dr. Laury Axon. Informed pt that there were no future labs but an OV could be set up. Pt refused and stated it should just be lab. Please advise.

## 2022-08-14 NOTE — Telephone Encounter (Signed)
Please advise on labs needed.

## 2022-08-26 ENCOUNTER — Ambulatory Visit
Admission: EM | Admit: 2022-08-26 | Discharge: 2022-08-26 | Disposition: A | Discharge status: Home / Self Care | Payer: Medicare Other

## 2022-09-04 DIAGNOSIS — H25813 Combined forms of age-related cataract, bilateral: Secondary | ICD-10-CM | POA: Diagnosis not present

## 2022-09-18 ENCOUNTER — Encounter: Payer: Self-pay | Admitting: Family Medicine

## 2022-09-18 ENCOUNTER — Ambulatory Visit (INDEPENDENT_AMBULATORY_CARE_PROVIDER_SITE_OTHER): Payer: Medicare Other | Admitting: Family Medicine

## 2022-09-18 VITALS — BP 100/70 | HR 63 | Temp 97.9°F | Resp 18 | Ht 68.0 in | Wt 189.0 lb

## 2022-09-18 DIAGNOSIS — D46Z Other myelodysplastic syndromes: Secondary | ICD-10-CM

## 2022-09-18 DIAGNOSIS — D649 Anemia, unspecified: Secondary | ICD-10-CM

## 2022-09-18 DIAGNOSIS — E039 Hypothyroidism, unspecified: Secondary | ICD-10-CM | POA: Diagnosis not present

## 2022-09-18 DIAGNOSIS — E785 Hyperlipidemia, unspecified: Secondary | ICD-10-CM | POA: Diagnosis not present

## 2022-09-18 DIAGNOSIS — I10 Essential (primary) hypertension: Secondary | ICD-10-CM

## 2022-09-18 DIAGNOSIS — E119 Type 2 diabetes mellitus without complications: Secondary | ICD-10-CM | POA: Diagnosis not present

## 2022-09-18 NOTE — Assessment & Plan Note (Signed)
Tolerating statin, encouraged heart healthy diet, avoid trans fats, minimize simple carbs and saturated fats. Increase exercise as tolerated  Lab Results  Component Value Date   CHOL 95 04/24/2022   HDL 28.30 (L) 04/24/2022   LDLCALC 54 04/24/2022   TRIG 62.0 04/24/2022   CHOLHDL 3 04/24/2022

## 2022-09-18 NOTE — Assessment & Plan Note (Addendum)
Check labs  Lab Results  Component Value Date   TSH 3.64 04/24/2022   Con't synthroid

## 2022-09-18 NOTE — Assessment & Plan Note (Signed)
Check cbc 

## 2022-09-18 NOTE — Assessment & Plan Note (Signed)
Check labs hgba1c to be checked.  minimize simple carbs. Increase exercise as tolerated. Continue current meds  

## 2022-09-18 NOTE — Assessment & Plan Note (Signed)
Well controlled, no changes to meds. Encouraged heart healthy diet such as the DASH diet and exercise as tolerated.  °

## 2022-09-18 NOTE — Progress Notes (Signed)
Established Patient Office Visit  Subjective   Patient ID: Trevor Cooke, male    DOB: [birth year]  Age: 82 y.o. MRN: [id number]  Chief Complaint  Patient presents with   Hypertension    Pt states blood pressures have been running high at home and other doctors office around 160/82.   Follow-up    HPI Discussed the use of AI scribe software for clinical note transcription with the patient, who gave verbal consent to proceed.  History of Present Illness   The patient, with a history of hypertension and declining kidney function, presents with concerns about recent elevated blood pressure readings. He reports readings of 160/84 and 160/85 at recent appointments with other providers, but also note that his blood pressure was 140/74 with Dr. Curly Rim. He has been monitoring his blood pressure at home, with readings as high as 150. He has made dietary changes, including reducing caffeine and possibly salt intake, which he believes may have contributed to the elevated readings. He expresses anxiety about these high readings, particularly as he understands the potential impact on his kidney function.  In addition to his primary concerns, the patient also mentions a recent illness, described as a respiratory infection that lasted about a week and a half. He reports feeling unwell and coughing, but has since recovered. He also mentions having stable, but low, blood counts, which are being monitored but not actively treated.      Patient Active Problem List   Diagnosis Date Noted   Decreased GFR 02/06/2022   Other neutropenia (HCC) 04/19/2021   Type 2 diabetes mellitus with diabetic peripheral angiopathy without gangrene, without long-term current use of insulin (HCC) 04/19/2021   MDS (myelodysplastic syndrome), low grade (HCC) 04/26/2016   Pharyngoesophageal dysphagia 12/03/2014   Routine health maintenance 08/01/2011   PSORIASIS 05/31/2010   CHEST WALL PAIN, ACUTE 04/12/2010    Hypogonadism male 02/12/2007   Hypothyroidism 10/06/2006   Diabetes mellitus, type 2 (HCC) 10/06/2006   Hyperlipidemia 10/06/2006   Primary hypertension 10/06/2006   GERD 10/06/2006   Diet-controlled diabetes mellitus (HCC) 10/06/2006   Anemia 10/06/2006   Past Medical History:  Diagnosis Date   Arthritis    in neck   CVA (cerebral infarction) 1996   GERD (gastroesophageal reflux disease)    History of prediabetes    Hyperlipidemia    Hypertension    MDS (myelodysplastic syndrome), low grade (HCC) 04/26/2016   Neutropenia (HCC)    Psoriasis    Thyroid disease    Past Surgical History:  Procedure Laterality Date   CLEFT PALATE REPAIR  1940's   left    Social History   Tobacco Use   Smoking status: Former   Smokeless tobacco: Never   Tobacco comments:    quit 1970  Vaping Use   Vaping Use: Never used  Substance Use Topics   Alcohol use: Yes    Alcohol/week: 0.0 standard drinks of alcohol    Comment: occasional wine   Drug use: No   Social History   Socioeconomic History   Marital status: Married    Spouse name: Not on file   Number of children: 3   Years of education: Not on file   Highest education level: Master's degree (e.g., MA, MS, MEng, MEd, MSW, MBA)  Occupational History   Not on file  Tobacco Use   Smoking status: Former   Smokeless tobacco: Never   Tobacco comments:    quit 1970  Vaping Use   Vaping Use: Never  used  Substance and Sexual Activity   Alcohol use: Yes    Alcohol/week: 0.0 standard drinks of alcohol    Comment: occasional wine   Drug use: No   Sexual activity: Never  Other Topics Concern   Not on file  Social History Narrative   Not on file   Social Determinants of Health   Financial Resource Strain: Low Risk  (09/16/2022)   Overall Financial Resource Strain (CARDIA)    Difficulty of Paying Living Expenses: Not hard at all  Food Insecurity: No Food Insecurity (09/16/2022)   Hunger Vital Sign    Worried About Running Out  of Food in the Last Year: Never true    Ran Out of Food in the Last Year: Never true  Transportation Needs: No Transportation Needs (09/16/2022)   PRAPARE - Administrator, Civil Service (Medical): No    Lack of Transportation (Non-Medical): No  Physical Activity: Insufficiently Active (09/16/2022)   Exercise Vital Sign    Days of Exercise per Week: 3 days    Minutes of Exercise per Session: 30 min  Stress: No Stress Concern Present (09/16/2022)   [employer] of Occupational Health - Occupational Stress Questionnaire    Feeling of Stress : Not at all  Social Connections: Socially Integrated (09/16/2022)   Social Connection and Isolation Panel [NHANES]    Frequency of Communication with Friends and Family: More than three times a week    Frequency of Social Gatherings with Friends and Family: Once a week    Attends Religious Services: More than 4 times per year    Active Member of [employer] or Organizations: Yes    Attends Engineer, structural: More than 4 times per year    Marital Status: Married  Catering manager Violence: Not on file   Family Status  Relation Name Status   Mother  Deceased   Father  Deceased   Sister carolyn Deceased   Sister emma Alive   Nurse, mental health  (Not Specified)   Mat Uncle Jerly Alive   Neg Hx  (Not Specified)   Family History  Problem Relation Age of Onset   Hypertension Mother    Hypertension Father    Stroke Father        died from it    Diabetes Sister    Pancreatic cancer Sister    Cancer Maternal Uncle        all 4 had cancer. One was brain cancer   Diabetes Maternal Uncle    Colon cancer Neg Hx    Thyroid disease Neg Hx    No Known Allergies    Review of Systems  Constitutional:  Negative for fever and malaise/fatigue.  HENT:  Negative for congestion.   Eyes:  Negative for blurred vision.  Respiratory:  Negative for shortness of breath.   Cardiovascular:  Negative for chest pain, palpitations and leg swelling.   Gastrointestinal:  Negative for abdominal pain, blood in stool and nausea.  Genitourinary:  Negative for dysuria and frequency.  Musculoskeletal:  Negative for falls.  Skin:  Negative for rash.  Neurological:  Negative for dizziness, loss of consciousness and headaches.  Endo/Heme/Allergies:  Negative for environmental allergies.  Psychiatric/Behavioral:  Negative for depression. The patient is not nervous/anxious.       Objective:     BP 100/70 (BP Location: Left Arm, Patient Position: Sitting, Cuff Size: Normal)   Pulse 63   Temp 97.9 F (36.6 C) (Oral)   Resp 18   Ht  5\' 8"  (1.727 m)   Wt 189 lb (85.7 kg)   SpO2 95%   BMI 28.74 kg/m  BP Readings from Last 3 Encounters:  09/18/22 100/70  08/04/22 (!) 140/74  04/24/22 122/86   Wt Readings from Last 3 Encounters:  09/18/22 189 lb (85.7 kg)  08/04/22 189 lb (85.7 kg)  04/24/22 189 lb 6.4 oz (85.9 kg)   SpO2 Readings from Last 3 Encounters:  09/18/22 95%  08/04/22 95%  04/24/22 97%      Physical Exam Vitals and nursing note reviewed.  Constitutional:      Appearance: He is well-developed.  HENT:     Head: Normocephalic and atraumatic.  Eyes:     Pupils: Pupils are equal, round, and reactive to light.  Neck:     Thyroid: No thyromegaly.  Cardiovascular:     Rate and Rhythm: Normal rate and regular rhythm.     Heart sounds: No murmur heard. Pulmonary:     Effort: Pulmonary effort is normal. No respiratory distress.     Breath sounds: Normal breath sounds. No wheezing or rales.  Chest:     Chest wall: No tenderness.  Musculoskeletal:        General: Normal range of motion.     Cervical back: Normal range of motion and neck supple.     Right hip: No tenderness. Normal range of motion. Normal strength.     Left hip: No tenderness. Normal range of motion. Normal strength.     Right foot: No swelling or bony tenderness.     Left foot: No swelling or bony tenderness.  Skin:    General: Skin is warm and dry.   Neurological:     General: No focal deficit present.     Mental Status: He is alert and oriented to person, place, and time.  Psychiatric:        Behavior: Behavior normal.        Thought Content: Thought content normal.        Judgment: Judgment normal.      No results found for any visits on 09/18/22.  Last CBC Lab Results  Component Value Date   WBC 2.4 (L) 08/04/2022   HGB 14.1 08/04/2022   HCT 40.9 08/04/2022   MCV 99.3 08/04/2022   MCH 34.2 (H) 08/04/2022   RDW 13.8 08/04/2022   PLT 147 (L) 08/04/2022   Last metabolic panel Lab Results  Component Value Date   GLUCOSE 154 (H) 08/04/2022   NA 140 08/04/2022   K 4.4 08/04/2022   CL 98 08/04/2022   CO2 33 (H) 08/04/2022   BUN 18 08/04/2022   CREATININE 1.35 (H) 08/04/2022   GFRNONAA 52 (L) 08/04/2022   CALCIUM 9.8 08/04/2022   PROT 7.5 08/04/2022   ALBUMIN 4.6 08/04/2022   BILITOT 0.8 08/04/2022   ALKPHOS 58 08/04/2022   AST 23 08/04/2022   ALT 16 08/04/2022   ANIONGAP 9 08/04/2022   Last lipids Lab Results  Component Value Date   CHOL 95 04/24/2022   HDL 28.30 (L) 04/24/2022   LDLCALC 54 04/24/2022   TRIG 62.0 04/24/2022   CHOLHDL 3 04/24/2022   Last hemoglobin A1c Lab Results  Component Value Date   HGBA1C 6.2 04/24/2022   Last thyroid functions Lab Results  Component Value Date   TSH 3.64 04/24/2022   Last vitamin D No results found for: "25OHVITD2", "25OHVITD3", "VD25OH" Last vitamin B12 and Folate Lab Results  Component Value Date   VITAMINB12 359 04/08/2007  FOLATE > 20.0 ng/mL 04/08/2007      The ASCVD Risk score (Arnett DK, et al., 2019) failed to calculate for the following reasons:   The 2019 ASCVD risk score is only valid for ages 69 to 71    Assessment & Plan:   Problem List Items Addressed This Visit       Unprioritized   Primary hypertension    Well controlled, no changes to meds. Encouraged heart healthy diet such as the DASH diet and exercise as tolerated.         MDS (myelodysplastic syndrome), low grade (HCC) (Chronic)    Check cbc      Hypothyroidism    Check labs  Lab Results  Component Value Date   TSH 3.64 04/24/2022  Con't synthroid      Hyperlipidemia    Tolerating statin, encouraged heart healthy diet, avoid trans fats, minimize simple carbs and saturated fats. Increase exercise as tolerated  Lab Results  Component Value Date   CHOL 95 04/24/2022   HDL 28.30 (L) 04/24/2022   LDLCALC 54 04/24/2022   TRIG 62.0 04/24/2022   CHOLHDL 3 04/24/2022         Diet-controlled diabetes mellitus (HCC)    Check labs  hgba1c to be checked--- minimize simple carbs. Increase exercise as tolerated. Continue current meds       Anemia - Primary    Check labs       Other Visit Diagnoses     Essential hypertension           No follow-ups on file.    Donato Schultz, DO

## 2022-09-18 NOTE — Assessment & Plan Note (Signed)
Check labs 

## 2022-09-19 LAB — TSH: TSH: 4.2 u[IU]/mL (ref 0.35–5.50)

## 2022-09-19 LAB — LIPID PANEL
Cholesterol: 81 mg/dL (ref 0–200)
HDL: 25.6 mg/dL — ABNORMAL LOW (ref >39.00)
LDL Cholesterol: 44 mg/dL (ref 0–99)
NonHDL: 55.84
Total CHOL/HDL Ratio: 3
Triglycerides: 59 mg/dL (ref 0.0–149.0)
VLDL: 11.8 mg/dL (ref 0.0–40.0)

## 2022-09-19 LAB — CBC WITH DIFFERENTIAL/PLATELET
Basophils Absolute: 0 10*3/uL (ref 0.0–0.1)
Basophils Relative: 0.8 % (ref 0.0–3.0)
Eosinophils Absolute: 0.1 10*3/uL (ref 0.0–0.7)
Eosinophils Relative: 2.3 % (ref 0.0–5.0)
HCT: 38.4 % — ABNORMAL LOW (ref 39.0–52.0)
Hemoglobin: 12.9 g/dL — ABNORMAL LOW (ref 13.0–17.0)
Lymphocytes Relative: 47.4 % — ABNORMAL HIGH (ref 12.0–46.0)
Lymphs Abs: 1.1 10*3/uL (ref 0.7–4.0)
MCHC: 33.4 g/dL (ref 30.0–36.0)
MCV: 102.2 fl — ABNORMAL HIGH (ref 78.0–100.0)
Monocytes Absolute: 0.2 10*3/uL (ref 0.1–1.0)
Monocytes Relative: 6.8 % (ref 3.0–12.0)
Neutro Abs: 1 10*3/uL — ABNORMAL LOW (ref 1.4–7.7)
Neutrophils Relative %: 42.7 % — ABNORMAL LOW (ref 43.0–77.0)
Platelets: 139 10*3/uL — ABNORMAL LOW (ref 150.0–400.0)
RBC: 3.76 Mil/uL — ABNORMAL LOW (ref 4.22–5.81)
RDW: 14.8 % (ref 11.5–15.5)
WBC: 2.3 10*3/uL — ABNORMAL LOW (ref 4.0–10.5)

## 2022-09-19 LAB — COMPREHENSIVE METABOLIC PANEL
ALT: 18 U/L (ref 0–53)
AST: 23 U/L (ref 0–37)
Albumin: 3.9 g/dL (ref 3.5–5.2)
Alkaline Phosphatase: 64 U/L (ref 39–117)
BUN: 18 mg/dL (ref 6–23)
CO2: 30 mEq/L (ref 19–32)
Calcium: 8.8 mg/dL (ref 8.4–10.5)
Chloride: 100 mEq/L (ref 96–112)
Creatinine, Ser: 1.24 mg/dL (ref 0.40–1.50)
GFR: 53.76 mL/min — ABNORMAL LOW (ref >60.00)
Glucose, Bld: 87 mg/dL (ref 70–99)
Potassium: 4.3 mEq/L (ref 3.5–5.1)
Sodium: 136 mEq/L (ref 135–145)
Total Bilirubin: 0.6 mg/dL (ref 0.2–1.2)
Total Protein: 6.7 g/dL (ref 6.0–8.3)

## 2022-09-19 LAB — HEMOGLOBIN A1C: Hgb A1c MFr Bld: 5.9 % (ref 4.6–6.5)

## 2022-10-05 ENCOUNTER — Ambulatory Visit: Payer: Medicare Other | Admitting: Dermatology

## 2022-10-05 VITALS — BP 117/70 | HR 84

## 2022-10-05 DIAGNOSIS — L570 Actinic keratosis: Secondary | ICD-10-CM | POA: Diagnosis not present

## 2022-10-05 DIAGNOSIS — L408 Other psoriasis: Secondary | ICD-10-CM

## 2022-10-05 DIAGNOSIS — Z79899 Other long term (current) drug therapy: Secondary | ICD-10-CM | POA: Diagnosis not present

## 2022-10-05 DIAGNOSIS — L578 Other skin changes due to chronic exposure to nonionizing radiation: Secondary | ICD-10-CM

## 2022-10-05 DIAGNOSIS — W908XXA Exposure to other nonionizing radiation, initial encounter: Secondary | ICD-10-CM | POA: Diagnosis not present

## 2022-10-05 DIAGNOSIS — L409 Psoriasis, unspecified: Secondary | ICD-10-CM

## 2022-10-05 MED ORDER — CALCIPOTRIENE 0.005 % EX CREA
TOPICAL_CREAM | CUTANEOUS | 11 refills | Status: DC
Start: 2022-10-05 — End: 2023-08-21

## 2022-10-05 MED ORDER — CALCIPOTRIENE-BETAMETH DIPROP 0.005-0.064 % EX SUSP
Freq: Every day | CUTANEOUS | 11 refills | Status: AC
Start: 2022-10-05 — End: ?

## 2022-10-05 NOTE — Progress Notes (Signed)
New pt Visit   Subjective  Trevor Cooke is a 82 y.o. male who presents for the following: Psoriasis Patient here today to establish care. Reports psoriasis many years. Active areas Many years, elbows, knees and right hip scalp  Wife is with patient and contributes to history. Patient currently uses calcipotriene solution at scalp and cream at affected areas of body for psoriasis.  The patient has spots, moles and lesions to be evaluated, some may be new or changing and the patient may have concern these could be cancer.  The following portions of the chart were reviewed this encounter and updated as appropriate: medications, allergies, medical history  Review of Systems:  No other skin or systemic complaints except as noted in HPI or Assessment and Plan.  Objective  Well appearing patient in no apparent distress; mood and affect are within normal limits.  Areas Examined: Scalp, face, right hip, b/l legs, b/l arms   Relevant exam findings are noted in the Assessment and Plan.    scalp and face x 5 (5) Erythematous thin papules/macules with gritty scale.    Assessment & Plan   Other psoriasis  Related Medications calcipotriene-betamethasone (TACLONEX SCALP) external suspension Apply topically daily. For psoriasis at scalp  calcipotriene (DOVONOX) 0.005 % cream Apply topically to aa of psoriasis qd/bid prn  Actinic keratosis (5) scalp and face x 5  Actinic keratoses are precancerous spots that appear secondary to cumulative UV radiation exposure/sun exposure over time. They are chronic with expected duration over 1 year. A portion of actinic keratoses will progress to squamous cell carcinoma of the skin. It is not possible to reliably predict which spots will progress to skin cancer and so treatment is recommended to prevent development of skin cancer.  Recommend daily broad spectrum sunscreen SPF 30+ to sun-exposed areas, reapply every 2 hours as needed.  Recommend  staying in the shade or wearing long sleeves, sun glasses (UVA+UVB protection) and wide brim hats (4-inch brim around the entire circumference of the hat). Call for new or changing lesions.  Destruction of lesion - scalp and face x 5 (5) Complexity: simple   Destruction method: cryotherapy   Informed consent: discussed and consent obtained   Timeout:  patient name, date of birth, surgical site, and procedure verified Lesion destroyed using liquid nitrogen: Yes   Region frozen until ice ball extended beyond lesion: Yes   Outcome: patient tolerated procedure well with no complications   Post-procedure details: wound care instructions given    Medication management  Psoriasis  Actinic skin damage   PSORIASIS Well-demarcated erythematous papules/plaques with silvery scale, guttate pink scaly papules.  10 % BSA. B/l Elbows and right hip  Chronic and persistent condition with duration or expected duration over one year. Condition is symptomatic/ bothersome to patient. Not currently at goal.  Patient denies joint pain   Treatment Plan: Continue calcipotriene solution - apply topically to scaly as needed for itchy and scale Continue calcipotriene cream - apply topically to aa's of body qd prn for psoriasis   Discussed topical steroids, patient declined.   Will consider topical steroid in future twice daily 3 x weekly on Friday, Saturday, and Sunday.   Long term medication management.  Patient is using long term (months to years) prescription medication  to control their dermatologic condition.  These medications require periodic monitoring to evaluate for efficacy and side effects and may require periodic laboratory monitoring.  Counseling on psoriasis and coordination of care  psoriasis is a chronic non-curable,  but treatable genetic/hereditary disease that may have other systemic features affecting other organ systems such as joints (Psoriatic Arthritis). It is associated with an  increased risk of inflammatory bowel disease, heart disease, non-alcoholic fatty liver disease, and depression.  Treatments include light and laser treatments; topical medications; and systemic medications including oral and injectables.  ACTINIC DAMAGE - chronic, secondary to cumulative UV radiation exposure/sun exposure over time - diffuse scaly erythematous macules with underlying dyspigmentation - Recommend daily broad spectrum sunscreen SPF 30+ to sun-exposed areas, reapply every 2 hours as needed.  - Recommend staying in the shade or wearing long sleeves, sun glasses (UVA+UVB protection) and wide brim hats (4-inch brim around the entire circumference of the hat). - Call for new or changing lesions.  Return for jan - february psoriasis follow up /tbse .  IAsher Muir, CMA, am acting as scribe for Armida Sans, MD.  Documentation: I have reviewed the above documentation for accuracy and completeness, and I agree with the above.  Armida Sans, MD

## 2022-10-05 NOTE — Patient Instructions (Addendum)
Actinic keratoses are precancerous spots that appear secondary to cumulative UV radiation exposure/sun exposure over time. They are chronic with expected duration over 1 year. A portion of actinic keratoses will progress to squamous cell carcinoma of the skin. It is not possible to reliably predict which spots will progress to skin cancer and so treatment is recommended to prevent development of skin cancer.  Recommend daily broad spectrum sunscreen SPF 30+ to sun-exposed areas, reapply every 2 hours as needed.  Recommend staying in the shade or wearing long sleeves, sun glasses (UVA+UVB protection) and wide brim hats (4-inch brim around the entire circumference of the hat). Call for new or changing lesions.   Cryotherapy Aftercare  Wash gently with soap and water everyday.   Apply Vaseline and Band-Aid daily until healed.   Due to recent changes in healthcare laws, you may see results of your pathology and/or laboratory studies on MyChart before the doctors have had a chance to review them. We understand that in some cases there may be results that are confusing or concerning to you. Please understand that not all results are received at the same time and often the doctors may need to interpret multiple results in order to provide you with the best plan of care or course of treatment. Therefore, we ask that you please give us 2 business days to thoroughly review all your results before contacting the office for clarification. Should we see a critical lab result, you will be contacted sooner.   If You Need Anything After Your Visit  If you have any questions or concerns for your doctor, please call our main line at [phone] and press option 4 to reach your doctor's medical assistant. If no one answers, please leave a voicemail as directed and we will return your call as soon as possible. Messages left after 4 pm will be answered the following business day.   You may also send us a message via  MyChart. We typically respond to MyChart messages within 1-2 business days.  For prescription refills, please ask your pharmacy to contact our office. Our fax number is [phone].  If you have an urgent issue when the clinic is closed that cannot wait until the next business day, you can page your doctor at the number below.    Please note that while we do our best to be available for urgent issues outside of office hours, we are not available 24/7.   If you have an urgent issue and are unable to reach us, you may choose to seek medical care at your doctor's office, retail clinic, urgent care center, or emergency room.  If you have a medical emergency, please immediately call 911 or go to the emergency department.  Pager Numbers  - Dr. Kowalski: [phone]  - Dr. Moye: [phone]  - Dr. Stewart: [phone]  In the event of inclement weather, please call our main line at [phone] for an update on the status of any delays or closures.  Dermatology Medication Tips: Please keep the boxes that topical medications come in in order to help keep track of the instructions about where and how to use these. Pharmacies typically print the medication instructions only on the boxes and not directly on the medication tubes.   If your medication is too expensive, please contact our office at [phone] option 4 or send us a message through MyChart.   We are unable to tell what your co-pay for medications will be in advance as this is different   depending on your insurance coverage. However, we may be able to find a substitute medication at lower cost or fill out paperwork to get insurance to cover a needed medication.   If a prior authorization is required to get your medication covered by your insurance company, please allow us 1-2 business days to complete this process.  Drug prices often vary depending on where the prescription is filled and some pharmacies may offer cheaper  prices.  The website www.goodrx.com contains coupons for medications through different pharmacies. The prices here do not account for what the cost may be with help from insurance (it may be cheaper with your insurance), but the website can give you the price if you did not use any insurance.  - You can print the associated coupon and take it with your prescription to the pharmacy.  - You may also stop by our office during regular business hours and pick up a GoodRx coupon card.  - If you need your prescription sent electronically to a different pharmacy, notify our office through [facility] MyChart or by phone at [phone] option 4.     Si Usted Necesita Algo Despus de Su Visita  Tambin puede enviarnos un mensaje a travs de MyChart. Por lo general respondemos a los mensajes de MyChart en el transcurso de 1 a 2 das hbiles.  Para renovar recetas, por favor pida a su farmacia que se ponga en contacto con nuestra oficina. Nuestro nmero de fax es el [phone].  Si tiene un asunto urgente cuando la clnica est cerrada y que no puede esperar hasta el siguiente da hbil, puede llamar/localizar a su doctor(a) al nmero que aparece a continuacin.   Por favor, tenga en cuenta que aunque hacemos todo lo posible para estar disponibles para asuntos urgentes fuera del horario de oficina, no estamos disponibles las 24 horas del da, los 7 das de la semana.   Si tiene un problema urgente y no puede comunicarse con nosotros, puede optar por buscar atencin mdica  en el consultorio de su doctor(a), en una clnica privada, en un centro de atencin urgente o en una sala de emergencias.  Si tiene una emergencia mdica, por favor llame inmediatamente al 911 o vaya a la sala de emergencias.  Nmeros de bper  - Dr. Kowalski: [phone]  - Dra. Moye: [phone]  - Dra. Stewart: [phone]  En caso de inclemencias del tiempo, por favor llame a nuestra lnea principal al [phone]  para una actualizacin sobre el estado de cualquier retraso o cierre.  Consejos para la medicacin en dermatologa: Por favor, guarde las cajas en las que vienen los medicamentos de uso tpico para ayudarle a seguir las instrucciones sobre dnde y cmo usarlos. Las farmacias generalmente imprimen las instrucciones del medicamento slo en las cajas y no directamente en los tubos del medicamento.   Si su medicamento es muy caro, por favor, pngase en contacto con nuestra oficina llamando al [phone] y presione la opcin 4 o envenos un mensaje a travs de MyChart.   No podemos decirle cul ser su copago por los medicamentos por adelantado ya que esto es diferente dependiendo de la cobertura de su seguro. Sin embargo, es posible que podamos encontrar un medicamento sustituto a menor costo o llenar un formulario para que el seguro cubra el medicamento que se considera necesario.   Si se requiere una autorizacin previa para que su compaa de seguros cubra su medicamento, por favor permtanos de 1 a 2 das hbiles para completar este   proceso.  Los precios de los medicamentos varan con frecuencia dependiendo del lugar de dnde se surte la receta y alguna farmacias pueden ofrecer precios ms baratos.  El sitio web www.goodrx.com tiene cupones para medicamentos de diferentes farmacias. Los precios aqu no tienen en cuenta lo que podra costar con la ayuda del seguro (puede ser ms barato con su seguro), pero el sitio web puede darle el precio si no utiliz ningn seguro.  - Puede imprimir el cupn correspondiente y llevarlo con su receta a la farmacia.  - Tambin puede pasar por nuestra oficina durante el horario de atencin regular y recoger una tarjeta de cupones de GoodRx.  - Si necesita que su receta se enve electrnicamente a una farmacia diferente, informe a nuestra oficina a travs de MyChart de [facility] o por telfono llamando al [phone] y presione la opcin 4.  

## 2022-10-06 ENCOUNTER — Encounter: Payer: Self-pay | Admitting: Dermatology

## 2022-10-19 DIAGNOSIS — H25813 Combined forms of age-related cataract, bilateral: Secondary | ICD-10-CM | POA: Diagnosis not present

## 2022-10-26 DIAGNOSIS — Z87891 Personal history of nicotine dependence: Secondary | ICD-10-CM | POA: Diagnosis not present

## 2022-10-26 DIAGNOSIS — H2512 Age-related nuclear cataract, left eye: Secondary | ICD-10-CM | POA: Diagnosis not present

## 2022-10-26 DIAGNOSIS — E1136 Type 2 diabetes mellitus with diabetic cataract: Secondary | ICD-10-CM | POA: Diagnosis not present

## 2022-10-26 DIAGNOSIS — Z79899 Other long term (current) drug therapy: Secondary | ICD-10-CM | POA: Diagnosis not present

## 2022-10-26 DIAGNOSIS — I10 Essential (primary) hypertension: Secondary | ICD-10-CM | POA: Diagnosis not present

## 2022-10-26 DIAGNOSIS — Z7982 Long term (current) use of aspirin: Secondary | ICD-10-CM | POA: Diagnosis not present

## 2022-10-26 HISTORY — PX: CATARACT EXTRACTION: SUR2

## 2022-10-30 ENCOUNTER — Ambulatory Visit: Payer: Medicare Other | Admitting: Family Medicine

## 2022-10-30 ENCOUNTER — Encounter: Payer: Self-pay | Admitting: Family Medicine

## 2022-10-30 VITALS — BP 130/80 | HR 52 | Temp 97.6°F | Resp 18 | Ht 68.0 in | Wt 188.4 lb

## 2022-10-30 DIAGNOSIS — I10 Essential (primary) hypertension: Secondary | ICD-10-CM | POA: Diagnosis not present

## 2022-10-30 DIAGNOSIS — E119 Type 2 diabetes mellitus without complications: Secondary | ICD-10-CM | POA: Diagnosis not present

## 2022-10-30 DIAGNOSIS — E039 Hypothyroidism, unspecified: Secondary | ICD-10-CM

## 2022-10-30 DIAGNOSIS — E785 Hyperlipidemia, unspecified: Secondary | ICD-10-CM | POA: Diagnosis not present

## 2022-10-30 LAB — CBC WITH DIFFERENTIAL/PLATELET
Basophils Absolute: 0 10*3/uL (ref 0.0–0.1)
Basophils Relative: 0.5 % (ref 0.0–3.0)
Eosinophils Absolute: 0 10*3/uL (ref 0.0–0.7)
Eosinophils Relative: 1.8 % (ref 0.0–5.0)
HCT: 40.1 % (ref 39.0–52.0)
Hemoglobin: 13.4 g/dL (ref 13.0–17.0)
Lymphocytes Relative: 36 % (ref 12.0–46.0)
Lymphs Abs: 0.9 10*3/uL (ref 0.7–4.0)
MCHC: 33.5 g/dL (ref 30.0–36.0)
MCV: 101.3 fl — ABNORMAL HIGH (ref 78.0–100.0)
Monocytes Absolute: 0.2 10*3/uL (ref 0.1–1.0)
Monocytes Relative: 6.5 % (ref 3.0–12.0)
Neutro Abs: 1.4 10*3/uL (ref 1.4–7.7)
Neutrophils Relative %: 55.2 % (ref 43.0–77.0)
Platelets: 163 10*3/uL (ref 150.0–400.0)
RBC: 3.95 Mil/uL — ABNORMAL LOW (ref 4.22–5.81)
RDW: 14.5 % (ref 11.5–15.5)
WBC: 2.6 10*3/uL — ABNORMAL LOW (ref 4.0–10.5)

## 2022-10-30 LAB — COMPREHENSIVE METABOLIC PANEL
ALT: 18 U/L (ref 0–53)
AST: 20 U/L (ref 0–37)
Albumin: 4.3 g/dL (ref 3.5–5.2)
Alkaline Phosphatase: 67 U/L (ref 39–117)
BUN: 12 mg/dL (ref 6–23)
CO2: 30 mEq/L (ref 19–32)
Calcium: 8.9 mg/dL (ref 8.4–10.5)
Chloride: 99 mEq/L (ref 96–112)
Creatinine, Ser: 1.12 mg/dL (ref 0.40–1.50)
GFR: 60.69 mL/min (ref >60.00)
Glucose, Bld: 106 mg/dL — ABNORMAL HIGH (ref 70–99)
Potassium: 4.3 mEq/L (ref 3.5–5.1)
Sodium: 137 mEq/L (ref 135–145)
Total Bilirubin: 0.8 mg/dL (ref 0.2–1.2)
Total Protein: 7 g/dL (ref 6.0–8.3)

## 2022-10-30 LAB — LIPID PANEL
Cholesterol: 95 mg/dL (ref 0–200)
HDL: 31.3 mg/dL — ABNORMAL LOW (ref >39.00)
LDL Cholesterol: 53 mg/dL (ref 0–99)
NonHDL: 63.83
Total CHOL/HDL Ratio: 3
Triglycerides: 54 mg/dL (ref 0.0–149.0)
VLDL: 10.8 mg/dL (ref 0.0–40.0)

## 2022-10-30 LAB — HEMOGLOBIN A1C: Hgb A1c MFr Bld: 6 % (ref 4.6–6.5)

## 2022-10-30 LAB — TSH: TSH: 3.68 u[IU]/mL (ref 0.35–5.50)

## 2022-10-30 NOTE — Progress Notes (Signed)
Established Patient Office Visit  Subjective   Patient ID: Trevor Cooke, male    DOB: Dec 10, 1940  Age: 82 y.o. MRN: [id number]  Chief Complaint  Patient presents with   Hypertension   Hyperlipidemia   Hypothyroidism   Follow-up    HPI Discussed the use of AI scribe software for clinical note transcription with the patient, who gave verbal consent to proceed.  History of Present Illness   The patient, with a history of high blood pressure and kidney concerns, recently underwent cataract surgery on the left eye. The surgery was initially planned for the right eye, but a forgotten contact lens led to a switch in the surgical plan. The patient reports a good recovery, with some restrictions on activities for a week. He is eager to have the right eye operated on as soon as possible.  The patient also discusses concerns about blood pressure, which spiked significantly on the day of the eye surgery but subsequently came down. He is not particularly worried about this, attributing it to the stress of the procedure.  The patient also mentions concerns about kidney function, particularly in relation to his weight and diet. He has been monitoring his blood pressure and blood sugar at home, with satisfactory results. He expresses fear about the possibility of needing dialysis in the future.      Patient Active Problem List   Diagnosis Date Noted   Decreased GFR 02/06/2022   Other neutropenia (HCC) 04/19/2021   Type 2 diabetes mellitus with diabetic peripheral angiopathy without gangrene, without long-term current use of insulin (HCC) 04/19/2021   MDS (myelodysplastic syndrome), low grade (HCC) 04/26/2016   Pharyngoesophageal dysphagia 12/03/2014   Routine health maintenance 08/01/2011   PSORIASIS 05/31/2010   CHEST WALL PAIN, ACUTE 04/12/2010   Hypogonadism male 02/12/2007   Hypothyroidism 10/06/2006   Diabetes mellitus, type 2 (HCC) 10/06/2006   Hyperlipidemia 10/06/2006   Primary  hypertension 10/06/2006   GERD 10/06/2006   Diet-controlled diabetes mellitus (HCC) 10/06/2006   Anemia 10/06/2006   Past Medical History:  Diagnosis Date   Arthritis    in neck   CVA (cerebral infarction) 1996   GERD (gastroesophageal reflux disease)    History of prediabetes    Hyperlipidemia    Hypertension    MDS (myelodysplastic syndrome), low grade (HCC) 04/26/2016   Neutropenia (HCC)    Psoriasis    Thyroid disease    Past Surgical History:  Procedure Laterality Date   CATARACT EXTRACTION Left 10/26/2022   [facility]   CLEFT PALATE REPAIR  1940's   left    Social History   Tobacco Use   Smoking status: Former   Smokeless tobacco: Never   Tobacco comments:    quit 1970  Vaping Use   Vaping status: Never Used  Substance Use Topics   Alcohol use: Yes    Alcohol/week: 0.0 standard drinks of alcohol    Comment: occasional wine   Drug use: No   Social History   Socioeconomic History   Marital status: Married    Spouse name: Not on file   Number of children: 3   Years of education: Not on file   Highest education level: Master's degree (e.g., MA, MS, MEng, MEd, MSW, MBA)  Occupational History   Not on file  Tobacco Use   Smoking status: Former   Smokeless tobacco: Never   Tobacco comments:    quit 1970  Vaping Use   Vaping status: Never Used  Substance and Sexual Activity  Alcohol use: Yes    Alcohol/week: 0.0 standard drinks of alcohol    Comment: occasional wine   Drug use: No   Sexual activity: Never  Other Topics Concern   Not on file  Social History Narrative   Not on file   Social Determinants of Health   Financial Resource Strain: Low Risk  (09/16/2022)   Overall Financial Resource Strain (CARDIA)    Difficulty of Paying Living Expenses: Not hard at all  Food Insecurity: No Food Insecurity (09/16/2022)   Hunger Vital Sign    Worried About Running Out of Food in the Last Year: Never true    Ran Out of Food in the Last Year: Never true   Transportation Needs: No Transportation Needs (09/16/2022)   PRAPARE - Administrator, Civil Service (Medical): No    Lack of Transportation (Non-Medical): No  Physical Activity: Insufficiently Active (09/16/2022)   Exercise Vital Sign    Days of Exercise per Week: 3 days    Minutes of Exercise per Session: 30 min  Stress: No Stress Concern Present (09/16/2022)   [employer] of Occupational Health - Occupational Stress Questionnaire    Feeling of Stress : Not at all  Social Connections: Socially Integrated (09/16/2022)   Social Connection and Isolation Panel [NHANES]    Frequency of Communication with Friends and Family: More than three times a week    Frequency of Social Gatherings with Friends and Family: Once a week    Attends Religious Services: More than 4 times per year    Active Member of [employer] or Organizations: Yes    Attends Engineer, structural: More than 4 times per year    Marital Status: Married  Catering manager Violence: Not on file   Family Status  Relation Name Status   Mother  Deceased   Father  Deceased   Sister carolyn Deceased   Sister emma Alive   Nurse, mental health  (Not Specified)   Mat Uncle Jerly Alive   Neg Hx  (Not Specified)  No partnership data on file   Family History  Problem Relation Age of Onset   Hypertension Mother    Hypertension Father    Stroke Father        died from it    Diabetes Sister    Pancreatic cancer Sister    Cancer Maternal Uncle        all 4 had cancer. One was brain cancer   Diabetes Maternal Uncle    Colon cancer Neg Hx    Thyroid disease Neg Hx    No Known Allergies    Review of Systems  Constitutional:  Negative for fever and malaise/fatigue.  HENT:  Negative for congestion.   Eyes:  Negative for blurred vision.  Respiratory:  Negative for cough and shortness of breath.   Cardiovascular:  Negative for chest pain, palpitations and leg swelling.  Gastrointestinal:  Negative for vomiting.   Musculoskeletal:  Negative for back pain.  Skin:  Negative for rash.  Neurological:  Negative for loss of consciousness and headaches.      Objective:     BP 130/80 (BP Location: Left Arm, Patient Position: Sitting, Cuff Size: Normal)   Pulse (!) 52   Temp 97.6 F (36.4 C) (Oral)   Resp 18   Ht 5\' 8"  (1.727 m)   Wt 188 lb 6.4 oz (85.5 kg)   SpO2 97%   BMI 28.65 kg/m  BP Readings from Last 3 Encounters:  10/30/22 130/80  10/05/22 117/70  09/18/22 100/70   Wt Readings from Last 3 Encounters:  10/30/22 188 lb 6.4 oz (85.5 kg)  09/18/22 189 lb (85.7 kg)  08/04/22 189 lb (85.7 kg)   SpO2 Readings from Last 3 Encounters:  10/30/22 97%  09/18/22 95%  08/04/22 95%      Physical Exam Vitals and nursing note reviewed.  Constitutional:      General: He is not in acute distress.    Appearance: Normal appearance. He is well-developed.  HENT:     Head: Normocephalic and atraumatic.  Eyes:     General: No scleral icterus.       Right eye: No discharge.        Left eye: No discharge.  Cardiovascular:     Rate and Rhythm: Normal rate and regular rhythm.     Heart sounds: No murmur heard. Pulmonary:     Effort: Pulmonary effort is normal. No respiratory distress.     Breath sounds: Normal breath sounds.  Musculoskeletal:        General: Normal range of motion.     Cervical back: Normal range of motion and neck supple.     Right lower leg: No edema.     Left lower leg: No edema.  Skin:    General: Skin is warm and dry.  Neurological:     Mental Status: He is alert and oriented to person, place, and time.  Psychiatric:        Mood and Affect: Mood normal.        Behavior: Behavior normal.        Thought Content: Thought content normal.        Judgment: Judgment normal.      No results found for any visits on 10/30/22.  Last CBC Lab Results  Component Value Date   WBC 2.3 (L) 09/18/2022   HGB 12.9 (L) 09/18/2022   HCT 38.4 (L) 09/18/2022   MCV 102.2 (H)  09/18/2022   MCH 34.2 (H) 08/04/2022   RDW 14.8 09/18/2022   PLT 139.0 (L) 09/18/2022   Last metabolic panel Lab Results  Component Value Date   GLUCOSE 87 09/18/2022   NA 136 09/18/2022   K 4.3 09/18/2022   CL 100 09/18/2022   CO2 30 09/18/2022   BUN 18 09/18/2022   CREATININE 1.24 09/18/2022   GFR 53.76 (L) 09/18/2022   CALCIUM 8.8 09/18/2022   PROT 6.7 09/18/2022   ALBUMIN 3.9 09/18/2022   BILITOT 0.6 09/18/2022   ALKPHOS 64 09/18/2022   AST 23 09/18/2022   ALT 18 09/18/2022   ANIONGAP 9 08/04/2022   Last lipids Lab Results  Component Value Date   CHOL 81 09/18/2022   HDL 25.60 (L) 09/18/2022   LDLCALC 44 09/18/2022   TRIG 59.0 09/18/2022   CHOLHDL 3 09/18/2022   Last hemoglobin A1c Lab Results  Component Value Date   HGBA1C 5.9 09/18/2022   Last thyroid functions Lab Results  Component Value Date   TSH 4.20 09/18/2022   Last vitamin D No results found for: "25OHVITD2", "25OHVITD3", "VD25OH" Last vitamin B12 and Folate Lab Results  Component Value Date   VITAMINB12 359 04/08/2007   FOLATE > 20.0 ng/mL 04/08/2007      The ASCVD Risk score (Arnett DK, et al., 2019) failed to calculate for the following reasons:   The 2019 ASCVD risk score is only valid for ages 14 to 32    Assessment & Plan:   Problem List Items Addressed This Visit  Unprioritized   Primary hypertension    Well controlled, no changes to meds. Encouraged heart healthy diet such as the DASH diet and exercise as tolerated.        Hypothyroidism    Check labs       Relevant Orders   TSH   Hyperlipidemia    Tolerating statin, encouraged heart healthy diet, avoid trans fats, minimize simple carbs and saturated fats. Increase exercise as tolerated       Relevant Orders   Comprehensive metabolic panel   Lipid panel   Diet-controlled diabetes mellitus (HCC)    CON'T DIET AND EXERCISE       Relevant Orders   Hemoglobin A1c   Other Visit Diagnoses     Essential  hypertension    -  Primary   Relevant Orders   CBC with Differential/Platelet     Assessment and Plan    Cataract Surgery Recent left eye cataract surgery on 10/26/2022 with good recovery. Right eye surgery delayed due to retained contact lens. Patient reports grainy vision without drops. -Continue post-operative eye drops as prescribed. -Plan for right eye cataract surgery when ready.  Hypertension Elevated blood pressure readings during recent surgery, but normalized subsequently. Home readings reported as normal. -Continue current management. -Monitor blood pressure at home.  Chronic Kidney Disease Stable GFR with recent readings of 54 and 53. Patient concerned about potential progression to dialysis. -Check BUN, creatinine, GFR, and urine protein today. -Reassure patient that current kidney function is relatively good for age. -Refer to nephrology if significant changes noted in future labs.  General Health Maintenance -Continue hydration, especially during hot weather. -Continue current medications as prescribed. -Follow-up as needed for any new or worsening symptoms.        No follow-ups on file.    Donato Schultz, DO

## 2022-10-30 NOTE — Assessment & Plan Note (Signed)
Tolerating statin, encouraged heart healthy diet, avoid trans fats, minimize simple carbs and saturated fats. Increase exercise as tolerated 

## 2022-10-30 NOTE — Assessment & Plan Note (Signed)
Well controlled, no changes to meds. Encouraged heart healthy diet such as the DASH diet and exercise as tolerated.  °

## 2022-10-30 NOTE — Assessment & Plan Note (Signed)
CON'T DIET AND EXERCISE

## 2022-10-30 NOTE — Assessment & Plan Note (Signed)
Check labs 

## 2022-12-08 DIAGNOSIS — Z48810 Encounter for surgical aftercare following surgery on the sense organs: Secondary | ICD-10-CM | POA: Diagnosis not present

## 2022-12-28 DIAGNOSIS — Z7982 Long term (current) use of aspirin: Secondary | ICD-10-CM | POA: Diagnosis not present

## 2022-12-28 DIAGNOSIS — H2511 Age-related nuclear cataract, right eye: Secondary | ICD-10-CM | POA: Diagnosis not present

## 2022-12-28 DIAGNOSIS — E1136 Type 2 diabetes mellitus with diabetic cataract: Secondary | ICD-10-CM | POA: Diagnosis not present

## 2022-12-28 DIAGNOSIS — I10 Essential (primary) hypertension: Secondary | ICD-10-CM | POA: Diagnosis not present

## 2022-12-28 DIAGNOSIS — Z8673 Personal history of transient ischemic attack (TIA), and cerebral infarction without residual deficits: Secondary | ICD-10-CM | POA: Diagnosis not present

## 2022-12-28 DIAGNOSIS — Z87891 Personal history of nicotine dependence: Secondary | ICD-10-CM | POA: Diagnosis not present

## 2022-12-28 DIAGNOSIS — H52221 Regular astigmatism, right eye: Secondary | ICD-10-CM | POA: Diagnosis not present

## 2023-01-30 ENCOUNTER — Other Ambulatory Visit (HOSPITAL_BASED_OUTPATIENT_CLINIC_OR_DEPARTMENT_OTHER): Payer: Self-pay

## 2023-01-30 DIAGNOSIS — Z23 Encounter for immunization: Secondary | ICD-10-CM | POA: Diagnosis not present

## 2023-01-30 MED ORDER — FLUAD 0.5 ML IM SUSY
0.5000 mL | PREFILLED_SYRINGE | Freq: Once | INTRAMUSCULAR | 0 refills | Status: AC
  Filled 2023-01-30: qty 0.5, 1d supply, fill #0

## 2023-02-05 ENCOUNTER — Inpatient Hospital Stay: Payer: Medicare Other | Attending: Hematology & Oncology

## 2023-02-05 ENCOUNTER — Other Ambulatory Visit: Payer: Self-pay

## 2023-02-05 ENCOUNTER — Inpatient Hospital Stay (HOSPITAL_BASED_OUTPATIENT_CLINIC_OR_DEPARTMENT_OTHER): Payer: Medicare Other | Admitting: Hematology & Oncology

## 2023-02-05 ENCOUNTER — Encounter: Payer: Self-pay | Admitting: Hematology & Oncology

## 2023-02-05 VITALS — BP 107/65 | HR 59 | Temp 97.7°F | Resp 18 | Ht 68.0 in | Wt 190.0 lb

## 2023-02-05 DIAGNOSIS — D469 Myelodysplastic syndrome, unspecified: Secondary | ICD-10-CM | POA: Diagnosis not present

## 2023-02-05 DIAGNOSIS — Z79899 Other long term (current) drug therapy: Secondary | ICD-10-CM | POA: Diagnosis not present

## 2023-02-05 DIAGNOSIS — Z79890 Hormone replacement therapy (postmenopausal): Secondary | ICD-10-CM | POA: Diagnosis not present

## 2023-02-05 DIAGNOSIS — D46Z Other myelodysplastic syndromes: Secondary | ICD-10-CM

## 2023-02-05 LAB — CBC WITH DIFFERENTIAL (CANCER CENTER ONLY)
Abs Immature Granulocytes: 0.01 10*3/uL (ref 0.00–0.07)
Basophils Absolute: 0 10*3/uL (ref 0.0–0.1)
Basophils Relative: 1 %
Eosinophils Absolute: 0 10*3/uL (ref 0.0–0.5)
Eosinophils Relative: 2 %
HCT: 40.2 % (ref 39.0–52.0)
Hemoglobin: 14.1 g/dL (ref 13.0–17.0)
Immature Granulocytes: 1 %
Lymphocytes Relative: 46 %
Lymphs Abs: 0.9 10*3/uL (ref 0.7–4.0)
MCH: 34.9 pg — ABNORMAL HIGH (ref 26.0–34.0)
MCHC: 35.1 g/dL (ref 30.0–36.0)
MCV: 99.5 fL (ref 80.0–100.0)
Monocytes Absolute: 0.1 10*3/uL (ref 0.1–1.0)
Monocytes Relative: 5 %
Neutro Abs: 1 10*3/uL — ABNORMAL LOW (ref 1.7–7.7)
Neutrophils Relative %: 45 %
Platelet Count: 166 10*3/uL (ref 150–400)
RBC: 4.04 MIL/uL — ABNORMAL LOW (ref 4.22–5.81)
RDW: 13.4 % (ref 11.5–15.5)
WBC Count: 2.1 10*3/uL — ABNORMAL LOW (ref 4.0–10.5)
nRBC: 0 % (ref 0.0–0.2)

## 2023-02-05 LAB — CMP (CANCER CENTER ONLY)
ALT: 17 U/L (ref 0–44)
AST: 23 U/L (ref 15–41)
Albumin: 4.5 g/dL (ref 3.5–5.0)
Alkaline Phosphatase: 75 U/L (ref 38–126)
Anion gap: 5 (ref 5–15)
BUN: 15 mg/dL (ref 8–23)
CO2: 32 mmol/L (ref 22–32)
Calcium: 9.4 mg/dL (ref 8.9–10.3)
Chloride: 102 mmol/L (ref 98–111)
Creatinine: 1.27 mg/dL — ABNORMAL HIGH (ref 0.61–1.24)
GFR, Estimated: 56 mL/min — ABNORMAL LOW (ref >60)
Glucose, Bld: 164 mg/dL — ABNORMAL HIGH (ref 70–99)
Potassium: 4.9 mmol/L (ref 3.5–5.1)
Sodium: 139 mmol/L (ref 135–145)
Total Bilirubin: 0.8 mg/dL (ref <1.2)
Total Protein: 7.6 g/dL (ref 6.5–8.1)

## 2023-02-05 LAB — SAVE SMEAR(SSMR), FOR PROVIDER SLIDE REVIEW

## 2023-02-05 NOTE — Progress Notes (Signed)
Hematology and Oncology Follow Up Visit  AYEDIN WINFREE [id number] 1940-04-07 82 y.o. 02/05/2023   Principle Diagnosis:  Myelodysplastic syndrome-low-grade (IPSS-R score = 1) - tp53+  Current Therapy:   Observation     Interim History:  Mr. Burge is back for follow-up.  We see him every 6 months.  He is doing quite well.  He did have cataract surgery back in August and then a second 1 in October.  He has had no issues with infections.  He has had no fever.  He has had no change in bowel or bladder habits.Marland Kitchen  He is worried about his renal function.  Apparently, his GFR was less than 60.  I told him that he would not have to worry unless Below 20.  He has had no problems with bowels.  Previous had a little bit of leg swelling.  This is chronic.  He has had no nausea or vomiting.  He has had no rashes.  His last iron studies that were done back in May showed a ferritin of 54 with an iron saturation of 28%.  Overall, I would say that his performance status is probably ECOG 1.  Medications:  Current Outpatient Medications:    Calcium Carbonate POWD, Take by mouth., Disp: , Rfl:    fluocinonide (LIDEX) 0.05 % external solution, Apply 1 Application topically 2 (two) times daily., Disp: , Rfl:    furosemide (LASIX) 20 MG tablet, Take 20 mg by mouth daily., Disp: , Rfl:    ketorolac (ACULAR) 0.5 % ophthalmic solution, Place 1 drop into the right eye., Disp: , Rfl:    ofloxacin (OCUFLOX) 0.3 % ophthalmic solution, Place 1 drop into the right eye., Disp: , Rfl:    prednisoLONE acetate (PRED FORTE) 1 % ophthalmic suspension, Place into the right eye., Disp: , Rfl:    B Complex-Biotin-FA (B-50 COMPLEX PO), Take 1 tablet by mouth daily., Disp: , Rfl:    calcipotriene (DOVONOX) 0.005 % cream, Apply topically to aa of psoriasis qd/bid prn, Disp: 60 g, Rfl: 11   calcipotriene-betamethasone (TACLONEX SCALP) external suspension, Apply topically daily. For psoriasis at scalp, Disp: 60 g, Rfl:  11   Calcitriol 3 MCG/GM cream, Apply topically., Disp: , Rfl:    Calcium Carb-Cholecalciferol (CALCIUM 500+D) 500-10 MG-MCG TABS, Take 1 tablet by mouth daily., Disp: , Rfl:    Cholecalciferol (VITAMIN D) 2000 units CAPS, Take 2,000 Units by mouth daily., Disp: , Rfl:    clobetasol cream (TEMOVATE) 0.05 %, Apply 1 application  topically as needed. (Patient not taking: Reported on 02/07/2022), Disp: , Rfl:    Emollient (EUCERIN) lotion, Apply topically as needed for dry skin., Disp: , Rfl:    enalapril (VASOTEC) 10 MG tablet, Take 1 tablet (10 mg total) by mouth daily., Disp: 90 tablet, Rfl: 3   glucose blood (FREESTYLE PRECISION NEO TEST) test strip, Check glucose 1 time daily dx:E11.9, Disp: 100 each, Rfl: 4   labetalol (NORMODYNE) 100 MG tablet, Take 1 tablet (100 mg total) by mouth 2 (two) times daily., Disp: 180 tablet, Rfl: 3   levothyroxine (SYNTHROID) 75 MCG tablet, Take 1 tablet (75 mcg total) by mouth daily., Disp: 90 tablet, Rfl: 3   losartan (COZAAR) 50 MG tablet, Take 50 mg by mouth daily., Disp: , Rfl:    lovastatin (MEVACOR) 20 MG tablet, Take 1 tablet (20 mg total) by mouth at bedtime., Disp: 90 tablet, Rfl: 3   Multiple Vitamins-Minerals (MULTIVITAMIN WITH MINERALS) tablet, Take 1 tablet by mouth daily.  CitraVit, Disp: , Rfl:    NIFEdipine (PROCARDIA XL/NIFEDICAL XL) 60 MG 24 hr tablet, Take 1 tablet (60 mg total) by mouth daily., Disp: 90 tablet, Rfl: 3   omeprazole (PRILOSEC) 20 MG capsule, Take 1 capsule by mouth daily., Disp: , Rfl:    Testosterone Enanthate (XYOSTED) 75 MG/0.5ML SOAJ, Inject into the skin., Disp: , Rfl:    Zinc 50 MG CAPS, Take 1 capsule by mouth daily., Disp: , Rfl:   Allergies: No Known Allergies  Past Medical History, Surgical history, Social history, and Family History were reviewed and updated.  Review of Systems: Review of Systems  Constitutional: Negative.   HENT: Negative.    Eyes: Negative.   Respiratory: Negative.    Cardiovascular:  Negative.   Gastrointestinal: Negative.   Genitourinary: Negative.   Musculoskeletal: Negative.   Skin: Negative.   Neurological: Negative.   Endo/Heme/Allergies: Negative.   Psychiatric/Behavioral: Negative.       Physical Exam:  height is 5\' 8"  (1.727 m) and weight is 190 lb (86.2 kg). His oral temperature is 97.7 F (36.5 C). His blood pressure is 107/65 and his pulse is 59 (abnormal). His respiration is 18 and oxygen saturation is 96%.   Wt Readings from Last 3 Encounters:  02/05/23 190 lb (86.2 kg)  10/30/22 188 lb 6.4 oz (85.5 kg)  09/18/22 189 lb (85.7 kg)     Physical Exam Vitals reviewed.  HENT:     Head: Normocephalic and atraumatic.  Eyes:     Pupils: Pupils are equal, round, and reactive to light.  Cardiovascular:     Rate and Rhythm: Normal rate and regular rhythm.     Heart sounds: Normal heart sounds.  Pulmonary:     Effort: Pulmonary effort is normal.     Breath sounds: Normal breath sounds.  Abdominal:     General: Bowel sounds are normal.     Palpations: Abdomen is soft.  Musculoskeletal:        General: No tenderness or deformity. Normal range of motion.     Cervical back: Normal range of motion.  Lymphadenopathy:     Cervical: No cervical adenopathy.  Skin:    General: Skin is warm and dry.     Findings: No erythema or rash.  Neurological:     Mental Status: He is alert and oriented to person, place, and time.  Psychiatric:        Behavior: Behavior normal.        Thought Content: Thought content normal.        Judgment: Judgment normal.      Lab Results  Component Value Date   WBC 2.1 (L) 02/05/2023   HGB 14.1 02/05/2023   HCT 40.2 02/05/2023   MCV 99.5 02/05/2023   PLT 166 02/05/2023     Chemistry      Component Value Date/Time   NA 139 02/05/2023 0851   NA 144 04/26/2016 1117   K 4.9 02/05/2023 0851   K 3.9 04/26/2016 1117   CL 102 02/05/2023 0851   CL 98 04/26/2016 1117   CO2 32 02/05/2023 0851   CO2 18 04/26/2016 1117    BUN 15 02/05/2023 0851   BUN 13 04/26/2016 1117   CREATININE 1.27 (H) 02/05/2023 0851   CREATININE 1.2 04/26/2016 1117      Component Value Date/Time   CALCIUM 9.4 02/05/2023 0851   CALCIUM 9.1 04/26/2016 1117   ALKPHOS 75 02/05/2023 0851   ALKPHOS 99 (H) 04/26/2016 1117   AST 23  02/05/2023 0851   ALT 17 02/05/2023 0851   ALT 22 04/26/2016 1117   BILITOT 0.8 02/05/2023 0851      Impression and Plan: Mr. Daisy is a 82 year old white male. He has myelodysplasia. This is manifested as leukopenia.   I looked at his blood under the microscope.  I did not see any immature myeloid cells.  He has no nucleated red blood cells.  His platelets look fine and are well granulated.  He had no blasts.  From my point of view, his myelodysplasia is holding nice and steady right now.  I am happy about this.  His quality of life is doing well.  I still think we can follow him up in 6 months.  I just would not imagine that his blood counts are going to change significantly in that time period.   Josph Macho, MD 11/11/20249:35 AM

## 2023-02-06 ENCOUNTER — Ambulatory Visit: Payer: Medicare Other | Admitting: Pharmacist

## 2023-02-06 DIAGNOSIS — I10 Essential (primary) hypertension: Secondary | ICD-10-CM

## 2023-02-06 DIAGNOSIS — E785 Hyperlipidemia, unspecified: Secondary | ICD-10-CM

## 2023-02-06 DIAGNOSIS — E119 Type 2 diabetes mellitus without complications: Secondary | ICD-10-CM

## 2023-02-06 MED ORDER — FUROSEMIDE 20 MG PO TABS: 20.0000 mg | ORAL_TABLET | Freq: Every day | ORAL | 1 refills | Status: DC

## 2023-02-06 NOTE — Progress Notes (Signed)
Pharmacy Note  02/06/2023 Name: Trevor Cooke MRN: [id number] DOB: 1940-12-22  Subjective: Trevor Cooke is a 82 y.o. year old male who is a primary care patient of Zola Button, Grayling Congress, DO. Clinical Pharmacist Practitioner referral was placed to assist with medication management.    Engaged with patient by telephone for follow up visit today.  Hypertension:  Current therapy: furosemide 20mg  daily, enalapril 10mg  daily and labetalol 100mg  twice a day.  Patient reports he had stopped furosemide for awhile but noticed blood pressure had increased. He restarted and needs refill.  Patient denies dizziness or syncope.   Hyperlipidemia Reports taking lovastatin as prescribed - filling on time. Denies myalgias Exercising 2 days per week and doing yard / housework  Type 2 DM No current pharmacotherapy. Last A1c at goal. Checked blood glucose at home about 2 to 3 times per week. Reports usually 105 to 125 Tries to limit sweets  Osteopenia and low testosterone Using testosterone therapy since 08/2019. BMD has improved since starting SQTR.  Denies falls in the last year.   Last DEXA Scan: 12/02/2021 T-Score femoral neck (left): -2.0 (previously was  -2.27 November 2019)  T-Score total hip (left) : -0.4 (previously was -1.27 November 2019) T-Score lumbar spine: + 0.7 (previously was -1.02 December 2019)   SDOH (Social Determinants of Health) assessments and interventions performed:  SDOH Interventions    Flowsheet Row Office Visit from 02/06/2023 in [facility] at [facility] Office Visit from 02/07/2022 in [facility] Primary Care at [facility] Chronic Care Management from 08/11/2021 in [facility] Primary Care at [facility] Chronic Care Management from 04/07/2021 in [facility] Primary Care at [facility] Chronic Care Management from 10/07/2020 in [facility] Primary Care at [facility]  SDOH Interventions       Food Insecurity Interventions -- -- Intervention Not Indicated -- --  Housing Interventions -- Intervention Not Indicated -- -- --  Transportation Interventions -- -- -- Intervention Not Indicated --  Utilities Interventions Intervention Not Indicated Intervention Not Indicated -- -- --  Financial Strain Interventions -- Intervention Not Indicated -- Intervention Not Indicated Intervention Not Indicated  Physical Activity Interventions Other (Comments)  [discussed increasing exercise] Intervention Not Indicated Other (Comments) Other (Comments)  [active at home and in yard] Other (Comments)  [encouraged patient increase physical activity to goal of 150 minutes per week]  Social Connections Interventions -- Intervention Not Indicated -- -- --        Objective: Review of patient status, including review of consultants reports, laboratory and other test data, was performed as part of comprehensive evaluation and provision of chronic care management services.   Lab Results  Component Value Date   CREATININE 1.27 (H) 02/05/2023   CREATININE 1.12 10/30/2022   CREATININE 1.24 09/18/2022    Lab Results  Component Value Date   HGBA1C 6.0 10/30/2022       Component Value Date/Time   CHOL 95 10/30/2022 0937   TRIG 54.0 10/30/2022 0937   TRIG 74 04/03/2006 1152   HDL 31.30 (L) 10/30/2022 0937   CHOLHDL 3 10/30/2022 0937   VLDL 10.8 10/30/2022 0937   LDLCALC 53 10/30/2022 0937     Clinical ASCVD: No  The ASCVD Risk score (Arnett DK, et al., 2019) failed to calculate for the following reasons:   The 2019 ASCVD risk score is only valid for ages 11 to 36  BP Readings from Last 3 Encounters:  02/05/23 107/65  10/30/22 130/80  10/05/22 117/70     No Known Allergies  Medications Reviewed Today     Reviewed by Henrene Pastor, RPH-CPP (Pharmacist) on 02/06/23 at [phone]  Med List Status: <None>   Medication Order Taking? Sig Documenting Provider  Last Dose Status Informant  B Complex-Biotin-FA (B-50 COMPLEX PO) [id number]  Take 1 tablet by mouth daily. [provider]  Active   calcipotriene (DOVONOX) 0.005 % cream [id number] Yes Apply topically to aa of psoriasis qd/bid prn Deirdre Evener, MD Taking Active   calcipotriene-betamethasone [facility] SCALP) external suspension [id number] Yes Apply topically daily. For psoriasis at scalp Deirdre Evener, MD Taking Active   Calcitriol 3 MCG/GM cream [id number] Yes Apply topically. [provider] Taking Active   Calcium Carb-Cholecalciferol (CALCIUM 500+D) 500-10 MG-MCG TABS [id number]  Take 1 tablet by mouth daily. [provider]  Active   Calcium Carbonate POWD [id number]  Take by mouth. [provider]  Active   Cholecalciferol (VITAMIN D) 2000 units CAPS [id number]  Take 2,000 Units by mouth daily. [provider]  Active   Emollient (EUCERIN) lotion [id number]  Apply topically as needed for dry skin. [provider]  Active Self  enalapril (VASOTEC) 10 MG tablet [id number]  Take 1 tablet (10 mg total) by mouth daily. Seabron Spates R, DO  Active   furosemide (LASIX) 20 MG tablet [id number]  Take 1 tablet (20 mg total) by mouth daily. Zola Button, Myrene Buddy R, DO  Active   glucose blood (FREESTYLE PRECISION NEO TEST) test strip [id number]  Check glucose 1 time daily dx:E11.9 Zola Button, Grayling Congress, DO  Active   labetalol (NORMODYNE) 100 MG tablet [id number]  Take 1 tablet (100 mg total) by mouth 2 (two) times daily. Donato Schultz, DO  Active   levothyroxine (SYNTHROID) 75 MCG tablet [id number] Yes Take 1 tablet (75 mcg total) by mouth daily. Donato Schultz, DO Taking Active   lovastatin (MEVACOR) 20 MG tablet [id number]  Take 1 tablet (20 mg total) by mouth at bedtime. Zola Button, Grayling Congress, DO  Active   Multiple Vitamins-Minerals (MULTIVITAMIN WITH MINERALS) tablet [id number]  Take 1 tablet by mouth daily. CitraVit [provider]  Active            Med Note Clydie Braun, Cindie Laroche Aug 11, 2021  2:06 PM)    NIFEdipine (PROCARDIA XL/NIFEDICAL XL) 60 MG 24 hr tablet [id number] Yes Take 1 tablet (60 mg total) by mouth daily. Seabron Spates R, DO Taking Active   omeprazole (PRILOSEC) 20 MG capsule [id number] Yes Take 1 capsule by mouth daily. [provider] Taking Active            Med Note Clydie Braun, Glenna Durand   Tue Feb 07, 2022 10:18 AM) Gets over-the-counter   Testosterone Enanthate (XYOSTED) 75 MG/0.5ML SOAJ [id number] Yes Inject into the skin. [provider] Taking Active   Zinc 50 MG CAPS [id number]  Take 1 capsule by mouth daily. [provider]  Active             Patient Active Problem List   Diagnosis Date Noted   Decreased GFR 02/06/2022   Type 2 diabetes mellitus with diabetic peripheral angiopathy without gangrene, without long-term current use of insulin (HCC) 04/19/2021   MDS (myelodysplastic syndrome), low grade (HCC) 04/26/2016   Pharyngoesophageal dysphagia 12/03/2014   Odynophagia 12/03/2014   Neutropenia (HCC) 02/04/2014  Routine history and physical examination of adult 08/01/2011   Psoriasis 05/31/2010   CHEST WALL PAIN, ACUTE 04/12/2010   Hypogonadism male 02/12/2007   Hypothyroidism 10/06/2006   Diabetes mellitus, type 2 (HCC) 10/06/2006   Hyperlipidemia 10/06/2006   Primary hypertension 10/06/2006   Gastroesophageal reflux disease without esophagitis 10/06/2006   Diet-controlled diabetes mellitus (HCC) 10/06/2006   Anemia 10/06/2006     Medication Assistance:  None required.  Patient affirms current coverage meets needs.   Assessment / Plan: Hypertension:  Reviewed blood pressure goal of < 130/80 Continue to check blood pressure 2 to 3 times per week - Notify office if any dizziness or blood pressure < 100/60 Continue current blood pressure medications - enalapril, furosemide, labetalol and nifedipine. Meds ordered this encounter   Medications   furosemide (LASIX) 20 MG tablet    Sig: Take 1 tablet (20 mg total) by mouth daily.    Dispense:  90 tablet    Refill:  1     Hyperlipidemia Reviewed LDL goal of < 70 Continue lovastatin 20mg  daily   Type 2 DM Discussed A1c goal.  Continue to check blood glucose 1 or 2 times per week  Osteopenia and low testosterone Continue SQ testosterone injection and calcium + vitamin D supplement,  Due to recheck DEXA in 2025  Updated med list - removed losartan as patient is taking enalapril and has not taken losartan in several years Removed eye drops prescribed before and after cataract surgery since pt is not longer using.   Follow Up:  Telephone follow up appointment with care management team member scheduled for:  1 year   Henrene Pastor, PharmD Clinical Pharmacist [facility] Primary Care  - [facility] [phone]

## 2023-02-12 DIAGNOSIS — E291 Testicular hypofunction: Secondary | ICD-10-CM | POA: Diagnosis not present

## 2023-02-12 DIAGNOSIS — E7800 Pure hypercholesterolemia, unspecified: Secondary | ICD-10-CM | POA: Diagnosis not present

## 2023-03-08 ENCOUNTER — Other Ambulatory Visit (HOSPITAL_BASED_OUTPATIENT_CLINIC_OR_DEPARTMENT_OTHER): Payer: Self-pay

## 2023-03-08 DIAGNOSIS — Z23 Encounter for immunization: Secondary | ICD-10-CM | POA: Diagnosis not present

## 2023-03-08 MED ORDER — COVID-19 MRNA VAC-TRIS(PFIZER) 30 MCG/0.3ML IM SUSY
0.3000 mL | PREFILLED_SYRINGE | Freq: Once | INTRAMUSCULAR | 0 refills | Status: AC
Start: 2023-03-08 — End: 2023-03-09
  Filled 2023-03-08: qty 0.3, 1d supply, fill #0

## 2023-04-17 ENCOUNTER — Ambulatory Visit (INDEPENDENT_AMBULATORY_CARE_PROVIDER_SITE_OTHER): Payer: Medicare Other | Admitting: Dermatology

## 2023-04-17 ENCOUNTER — Encounter: Payer: Self-pay | Admitting: Dermatology

## 2023-04-17 DIAGNOSIS — D1801 Hemangioma of skin and subcutaneous tissue: Secondary | ICD-10-CM | POA: Diagnosis not present

## 2023-04-17 DIAGNOSIS — L578 Other skin changes due to chronic exposure to nonionizing radiation: Secondary | ICD-10-CM

## 2023-04-17 DIAGNOSIS — L814 Other melanin hyperpigmentation: Secondary | ICD-10-CM | POA: Diagnosis not present

## 2023-04-17 DIAGNOSIS — L820 Inflamed seborrheic keratosis: Secondary | ICD-10-CM | POA: Diagnosis not present

## 2023-04-17 DIAGNOSIS — L821 Other seborrheic keratosis: Secondary | ICD-10-CM | POA: Diagnosis not present

## 2023-04-17 DIAGNOSIS — L570 Actinic keratosis: Secondary | ICD-10-CM

## 2023-04-17 DIAGNOSIS — Z79899 Other long term (current) drug therapy: Secondary | ICD-10-CM

## 2023-04-17 DIAGNOSIS — D229 Melanocytic nevi, unspecified: Secondary | ICD-10-CM

## 2023-04-17 DIAGNOSIS — W908XXA Exposure to other nonionizing radiation, initial encounter: Secondary | ICD-10-CM | POA: Diagnosis not present

## 2023-04-17 DIAGNOSIS — Z872 Personal history of diseases of the skin and subcutaneous tissue: Secondary | ICD-10-CM

## 2023-04-17 DIAGNOSIS — Z7189 Other specified counseling: Secondary | ICD-10-CM

## 2023-04-17 DIAGNOSIS — L409 Psoriasis, unspecified: Secondary | ICD-10-CM

## 2023-04-17 DIAGNOSIS — Z1283 Encounter for screening for malignant neoplasm of skin: Secondary | ICD-10-CM

## 2023-04-17 MED ORDER — MOMETASONE FUROATE 0.1 % EX SOLN: CUTANEOUS | 6 refills | Status: DC

## 2023-04-17 MED ORDER — CALCIPOTRIENE 0.005 % EX SOLN: 1.0000 | Freq: Every morning | CUTANEOUS | 11 refills | Status: DC

## 2023-04-17 MED ORDER — DESONIDE 0.05 % EX CREA: TOPICAL_CREAM | CUTANEOUS | 6 refills | Status: DC

## 2023-04-17 NOTE — Patient Instructions (Addendum)
Recommend DAH Sal shampoo Continue Calcipotriene solution to scalp in the morning Start Mometasone lotion 5 nights a week to scalp for itching, may use as needed for the itching Continue Desonide cream 5 mornings a week to affected area of psoriasis on body as needed for flares  Cryotherapy Aftercare  Wash gently with soap and water everyday.   Apply Vaseline and Band-Aid daily until healed.   Due to recent changes in healthcare laws, you may see results of your pathology and/or laboratory studies on MyChart before the doctors have had a chance to review them. We understand that in some cases there may be results that are confusing or concerning to you. Please understand that not all results are received at the same time and often the doctors may need to interpret multiple results in order to provide you with the best plan of care or course of treatment. Therefore, we ask that you please give [place] 2 business days to thoroughly review all your results before contacting the office for clarification. Should we see a critical lab result, you will be contacted sooner.   If You Need Anything After Your Visit  If you have any questions or concerns for your doctor, please call our main line at [phone] and press option 4 to reach your doctor's medical assistant. If no one answers, please leave a voicemail as directed and we will return your call as soon as possible. Messages left after 4 pm will be answered the following business day.   You may also send [place] a message via MyChart. We typically respond to MyChart messages within 1-2 business days.  For prescription refills, please ask your pharmacy to contact our office. Our fax number is [phone].  If you have an urgent issue when the clinic is closed that cannot wait until the next business day, you can page your doctor at the number below.    Please note that while we do our best to be available for urgent issues outside of office hours, we are  not available 24/7.   If you have an urgent issue and are unable to reach [place], you may choose to seek medical care at your doctor's office, retail clinic, urgent care center, or emergency room.  If you have a medical emergency, please immediately call 911 or go to the emergency department.  Pager Numbers  - Dr. Gwen Pounds: [phone]  - Dr. Roseanne Reno: [phone]  - Dr. Katrinka Blazing: [phone]   In the event of inclement weather, please call our main line at [phone] for an update on the status of any delays or closures.  Dermatology Medication Tips: Please keep the boxes that topical medications come in in order to help keep track of the instructions about where and how to use these. Pharmacies typically print the medication instructions only on the boxes and not directly on the medication tubes.   If your medication is too expensive, please contact our office at [phone] option 4 or send [place] a message through MyChart.   We are unable to tell what your co-pay for medications will be in advance as this is different depending on your insurance coverage. However, we may be able to find a substitute medication at lower cost or fill out paperwork to get insurance to cover a needed medication.   If a prior authorization is required to get your medication covered by your insurance company, please allow [place] 1-2 business days to complete this process.  Drug prices often vary depending on where the  prescription is filled and some pharmacies may offer cheaper prices.  The website www.goodrx.com contains coupons for medications through different pharmacies. The prices here do not account for what the cost may be with help from insurance (it may be cheaper with your insurance), but the website can give you the price if you did not use any insurance.  - You can print the associated coupon and take it with your prescription to the pharmacy.  - You may also stop by our office during regular business  hours and pick up a GoodRx coupon card.  - If you need your prescription sent electronically to a different pharmacy, notify our office through [facility] or by phone at [phone] option 4.     Si Usted Necesita Algo Despus de Su Visita  Tambin puede enviarnos un mensaje a travs de Clinical cytogeneticist. Por lo general respondemos a los mensajes de MyChart en el transcurso de 1 a 2 das hbiles.  Para renovar recetas, por favor pida a su farmacia que se ponga en contacto con nuestra oficina. Annie Sable de fax es [place] [phone].  Si tiene un asunto urgente cuando la clnica est cerrada y que no puede esperar hasta el siguiente da hbil, puede llamar/localizar a su doctor(a) al nmero que aparece a continuacin.   Por favor, tenga en cuenta que aunque hacemos todo lo posible para estar disponibles para asuntos urgentes fuera del horario de [place], no estamos disponibles las 24 horas del da, los 7 [address]  [address] de la [place].   Si tiene un problema urgente y no puede comunicarse con nosotros, puede optar por buscar atencin mdica  en el consultorio de su doctor(a), en una clnica privada, en un centro de atencin urgente o en una sala de emergencias.  Si tiene Engineer, drilling, por favor llame inmediatamente al 911 o vaya a la sala de emergencias.  Nmeros de bper  - Dr. Gwen Pounds: [phone]  - Dra. Roseanne Reno: [phone]  - Dr. Katrinka Blazing: [phone]   En caso de inclemencias del tiempo, por favor llame a Lacy Duverney principal al [phone] para una actualizacin sobre el [place] de cualquier retraso o cierre.  Consejos para la medicacin en dermatologa: Por favor, guarde las cajas en las que vienen los medicamentos de uso tpico para ayudarle a seguir las instrucciones sobre dnde y cmo usarlos. Las farmacias generalmente imprimen las instrucciones del medicamento slo en las cajas y no directamente en los tubos del [place].   Si su medicamento es muy caro, por favor,  pngase en contacto con Rolm [place]ala llamando al [phone] y presione la opcin 4 o envenos un mensaje a travs de Clinical cytogeneticist.   No podemos decirle cul ser su copago por los medicamentos por adelantado ya que esto es diferente dependiendo de la cobertura de su seguro. Sin embargo, es posible que podamos encontrar un medicamento sustituto a Audiological scientist un formulario para que el seguro cubra el medicamento que se considera necesario.   Si se requiere una autorizacin previa para que su compaa de seguros [place] su medicamento, por favor permtanos de 1 a 2 das hbiles para completar [address]  Los precios de los medicamentos varan con frecuencia dependiendo del Environmental consultant de dnde se surte la receta y alguna farmacias pueden ofrecer precios ms baratos.  El sitio web www.goodrx.com tiene cupones para medicamentos de Health and safety inspector. Los precios aqu no tienen en cuenta lo que podra costar con la ayuda del seguro (puede ser ms barato con su seguro), pero el sitio  web puede darle el precio si no Visual merchandiser.  - Puede imprimir el cupn correspondiente y llevarlo con su receta a la farmacia.  - Tambin puede pasar por nuestra oficina durante el horario de atencin regular y Education officer, museum una tarjeta de cupones de GoodRx.  - Si necesita que su receta se enve electrnicamente a una farmacia diferente, informe a nuestra oficina a travs de MyChart de [place] o por telfono llamando al [phone] y presione la opcin 4.

## 2023-04-17 NOTE — Progress Notes (Signed)
Follow-Up Visit   Subjective  Trevor Cooke is a 83 y.o. male who presents for the following: Skin Cancer Screening and Full Body Skin Exam hx of Aks, Psoriasis scalp, legs, arms, trunk, Calcipotriene sol qam to scalp, Desonide cr qam to aa body, scalp itches  The patient presents for Total-Body Skin Exam (TBSE) for skin cancer screening and mole check. The patient has spots, moles and lesions to be evaluated, some may be new or changing and the patient may have concern these could be cancer.  The following portions of the chart were reviewed this encounter and updated as appropriate: medications, allergies, medical history  Review of Systems:  No other skin or systemic complaints except as noted in HPI or Assessment and Plan.  Objective  Well appearing patient in no apparent distress; mood and affect are within normal limits.  A full examination was performed including scalp, head, eyes, ears, nose, lips, neck, chest, axillae, abdomen, back, buttocks, bilateral upper extremities, bilateral lower extremities, hands, feet, fingers, toes, fingernails, and toenails. All findings within normal limits unless otherwise noted below.   Relevant physical exam findings are noted in the Assessment and Plan.  face x 5 (5) Pink scaly macules face x 1, hands x 2, abdomen x 1, R arm x 1 (5) Stuck on waxy paps with erythema  Assessment & Plan   SKIN CANCER SCREENING PERFORMED TODAY.  ACTINIC DAMAGE - Chronic condition, secondary to cumulative UV/sun exposure - diffuse scaly erythematous macules with underlying dyspigmentation - Recommend daily broad spectrum sunscreen SPF 30+ to sun-exposed areas, reapply every 2 hours as needed.  - Staying in the shade or wearing long sleeves, sun glasses (UVA+UVB protection) and wide brim hats (4-inch brim around the entire circumference of the hat) are also recommended for sun protection.  - Call for new or changing lesions.  LENTIGINES, SEBORRHEIC  KERATOSES, HEMANGIOMAS - Benign normal skin lesions - Benign-appearing - Call for any changes  MELANOCYTIC NEVI - Tan-brown and/or pink-flesh-colored symmetric macules and papules - Benign appearing on exam today - Observation - Call clinic for new or changing moles - Recommend daily use of broad spectrum spf 30+ sunscreen to sun-exposed areas.   PSORIASIS Scalp, body Exam: plaque R hip, knees, scale scalp  7% BSA. Chronic and persistent condition with duration or expected duration over one year. Condition is symptomatic/ bothersome to patient. Not currently at goal.  patient denies joint pain  Psoriasis is a chronic non-curable, but treatable genetic/hereditary disease that may have other systemic features affecting other organ systems such as joints (Psoriatic Arthritis). It is associated with an increased risk of inflammatory bowel disease, heart disease, non-alcoholic fatty liver disease, and depression.  Treatments include light and laser treatments; topical medications; and systemic medications including oral and injectables.  Treatment Plan: Cont Calcipotriene sol to scalp qam Start Mometasone lotion 3-5x/wk to aa scalp prn itch, avoid f/g/a Cont Desonide cr qam 5d/wk to aa psoriasis on body  Topical steroids (such as triamcinolone, fluocinolone, fluocinonide, mometasone, clobetasol, halobetasol, betamethasone, hydrocortisone) can cause thinning and lightening of the skin if they are used for too long in the same area. Your physician has selected the right strength medicine for your problem and area affected on the body. Please use your medication only as directed by your physician to prevent side effects.   AK (ACTINIC KERATOSIS) (5) face x 5 (5) Actinic keratoses are precancerous spots that appear secondary to cumulative UV radiation exposure/sun exposure over time. They are chronic with  expected duration over 1 year. A portion of actinic keratoses will progress to squamous  cell carcinoma of the skin. It is not possible to reliably predict which spots will progress to skin cancer and so treatment is recommended to prevent development of skin cancer.  Recommend daily broad spectrum sunscreen SPF 30+ to sun-exposed areas, reapply every 2 hours as needed.  Recommend staying in the shade or wearing long sleeves, sun glasses (UVA+UVB protection) and wide brim hats (4-inch brim around the entire circumference of the hat). Call for new or changing lesions. Destruction of lesion - face x 5 (5) Complexity: simple   Destruction method: cryotherapy   Informed consent: discussed and consent obtained   Timeout:  patient name, date of birth, surgical site, and procedure verified Lesion destroyed using liquid nitrogen: Yes   Region frozen until ice ball extended beyond lesion: Yes   Outcome: patient tolerated procedure well with no complications   Post-procedure details: wound care instructions given   INFLAMED SEBORRHEIC KERATOSIS (5) face x 1, hands x 2, abdomen x 1, R arm x 1 (5) Symptomatic, irritating, patient would like treated. Destruction of lesion - face x 1, hands x 2, abdomen x 1, R arm x 1 (5) Complexity: simple   Destruction method: cryotherapy   Informed consent: discussed and consent obtained   Timeout:  patient name, date of birth, surgical site, and procedure verified Lesion destroyed using liquid nitrogen: Yes   Region frozen until ice ball extended beyond lesion: Yes   Outcome: patient tolerated procedure well with no complications   Post-procedure details: wound care instructions given   Return in about 1 year (around 04/16/2024) for TBSE, Hx of AKs.  I, Ardis Rowan, RMA, am acting as scribe for Armida Sans, MD .   Documentation: I have reviewed the above documentation for accuracy and completeness, and I agree with the above.  Armida Sans, MD

## 2023-05-03 ENCOUNTER — Ambulatory Visit: Payer: PRIVATE HEALTH INSURANCE | Admitting: Family Medicine

## 2023-05-08 ENCOUNTER — Ambulatory Visit: Payer: Medicare Other | Admitting: Family Medicine

## 2023-05-08 ENCOUNTER — Encounter: Payer: Self-pay | Admitting: Family Medicine

## 2023-05-08 VITALS — BP 110/68 | HR 57 | Temp 97.8°F | Resp 18 | Ht 68.0 in | Wt 189.8 lb

## 2023-05-08 DIAGNOSIS — I10 Essential (primary) hypertension: Secondary | ICD-10-CM | POA: Diagnosis not present

## 2023-05-08 DIAGNOSIS — N529 Male erectile dysfunction, unspecified: Secondary | ICD-10-CM | POA: Diagnosis not present

## 2023-05-08 DIAGNOSIS — D46Z Other myelodysplastic syndromes: Secondary | ICD-10-CM

## 2023-05-08 DIAGNOSIS — E785 Hyperlipidemia, unspecified: Secondary | ICD-10-CM

## 2023-05-08 DIAGNOSIS — L409 Psoriasis, unspecified: Secondary | ICD-10-CM

## 2023-05-08 DIAGNOSIS — E039 Hypothyroidism, unspecified: Secondary | ICD-10-CM

## 2023-05-08 DIAGNOSIS — E119 Type 2 diabetes mellitus without complications: Secondary | ICD-10-CM

## 2023-05-08 LAB — TSH: TSH: 3.83 u[IU]/mL (ref 0.35–5.50)

## 2023-05-08 LAB — COMPREHENSIVE METABOLIC PANEL
ALT: 26 U/L (ref 0–53)
AST: 28 U/L (ref 0–37)
Albumin: 4.3 g/dL (ref 3.5–5.2)
Alkaline Phosphatase: 80 U/L (ref 39–117)
BUN: 12 mg/dL (ref 6–23)
CO2: 32 meq/L (ref 19–32)
Calcium: 8.6 mg/dL (ref 8.4–10.5)
Chloride: 99 meq/L (ref 96–112)
Creatinine, Ser: 1.08 mg/dL (ref 0.40–1.50)
GFR: 63.17 mL/min (ref >60.00)
Glucose, Bld: 105 mg/dL — ABNORMAL HIGH (ref 70–99)
Potassium: 4 meq/L (ref 3.5–5.1)
Sodium: 137 meq/L (ref 135–145)
Total Bilirubin: 0.9 mg/dL (ref 0.2–1.2)
Total Protein: 7.7 g/dL (ref 6.0–8.3)

## 2023-05-08 LAB — CBC WITH DIFFERENTIAL/PLATELET
Basophils Absolute: 0 10*3/uL (ref 0.0–0.1)
Basophils Relative: 0.5 % (ref 0.0–3.0)
Eosinophils Absolute: 0.1 10*3/uL (ref 0.0–0.7)
Eosinophils Relative: 3.4 % (ref 0.0–5.0)
HCT: 40.4 % (ref 39.0–52.0)
Hemoglobin: 13.8 g/dL (ref 13.0–17.0)
Lymphocytes Relative: 48.5 % — ABNORMAL HIGH (ref 12.0–46.0)
Lymphs Abs: 1.1 10*3/uL (ref 0.7–4.0)
MCHC: 34.2 g/dL (ref 30.0–36.0)
MCV: 101.3 fL — ABNORMAL HIGH (ref 78.0–100.0)
Monocytes Absolute: 0.1 10*3/uL (ref 0.1–1.0)
Monocytes Relative: 4.8 % (ref 3.0–12.0)
Neutro Abs: 1 10*3/uL — ABNORMAL LOW (ref 1.4–7.7)
Neutrophils Relative %: 42.8 % — ABNORMAL LOW (ref 43.0–77.0)
Platelets: 153 10*3/uL (ref 150.0–400.0)
RBC: 3.99 Mil/uL — ABNORMAL LOW (ref 4.22–5.81)
RDW: 15.1 % (ref 11.5–15.5)
WBC: 2.3 10*3/uL — ABNORMAL LOW (ref 4.0–10.5)

## 2023-05-08 LAB — HEMOGLOBIN A1C: Hgb A1c MFr Bld: 6 % (ref 4.6–6.5)

## 2023-05-08 LAB — LIPID PANEL
Cholesterol: 100 mg/dL (ref 0–200)
HDL: 36.8 mg/dL — ABNORMAL LOW (ref >39.00)
LDL Cholesterol: 52 mg/dL (ref 0–99)
NonHDL: 63.15
Total CHOL/HDL Ratio: 3
Triglycerides: 57 mg/dL (ref 0.0–149.0)
VLDL: 11.4 mg/dL (ref 0.0–40.0)

## 2023-05-08 MED ORDER — LEVOTHYROXINE SODIUM 75 MCG PO TABS
75.0000 ug | ORAL_TABLET | Freq: Every day | ORAL | 3 refills | Status: DC
Start: 2023-05-08 — End: 2023-11-05

## 2023-05-08 MED ORDER — FREESTYLE PRECISION NEO TEST VI STRP
ORAL_STRIP | 4 refills | Status: DC
Start: 2023-05-08 — End: 2023-11-05

## 2023-05-08 MED ORDER — FUROSEMIDE 20 MG PO TABS: 20.0000 mg | ORAL_TABLET | Freq: Every day | ORAL | 1 refills | Status: DC

## 2023-05-08 MED ORDER — LABETALOL HCL 100 MG PO TABS: 100.0000 mg | ORAL_TABLET | Freq: Two times a day (BID) | ORAL | 3 refills | Status: DC

## 2023-05-08 MED ORDER — NIFEDIPINE ER OSMOTIC RELEASE 60 MG PO TB24: 60.0000 mg | ORAL_TABLET | Freq: Every day | ORAL | 3 refills | Status: DC

## 2023-05-08 MED ORDER — LEVOTHYROXINE SODIUM 75 MCG PO TABS: 75.0000 ug | ORAL_TABLET | Freq: Every day | ORAL | 3 refills | Status: DC

## 2023-05-08 MED ORDER — TADALAFIL 20 MG PO TABS: 10.0000 mg | ORAL_TABLET | ORAL | 11 refills | Status: DC | PRN

## 2023-05-08 MED ORDER — LOVASTATIN 20 MG PO TABS: 20.0000 mg | ORAL_TABLET | Freq: Every day | ORAL | 3 refills | Status: DC

## 2023-05-08 MED ORDER — ENALAPRIL MALEATE 10 MG PO TABS: 10.0000 mg | ORAL_TABLET | Freq: Every day | ORAL | 3 refills | Status: DC

## 2023-05-08 NOTE — Patient Instructions (Signed)

## 2023-05-08 NOTE — Assessment & Plan Note (Signed)
Encourage heart healthy diet such as MIND or DASH diet, increase exercise, avoid trans fats, simple carbohydrates and processed foods, consider a krill or fish or flaxseed oil cap daily.

## 2023-05-08 NOTE — Assessment & Plan Note (Signed)
Cont synthroid

## 2023-05-08 NOTE — Assessment & Plan Note (Signed)
hgba1c to be checked, minimize simple carbs. Increase exercise as tolerated. Continue current meds

## 2023-05-08 NOTE — Progress Notes (Signed)
Established Patient Office Visit  Subjective   Patient ID: Trevor Cooke, male    DOB: 04-26-1940  Age: 83 y.o. MRN: [id number]  Chief Complaint  Patient presents with   Diabetes   Hypertension   Hypothyroidism   Follow-up    HPI Discussed the use of AI scribe software for clinical note transcription with the patient, who gave verbal consent to proceed.  History of Present Illness   Trevor Cooke is an 83 year old male who presents with respiratory symptoms and medication management.  He has been experiencing respiratory symptoms since October, which recurred after a trip to [place]. He describes a lack of energy and a rattling sound in his lungs when breathing. The symptoms have been slow to resolve on both occasions.  He is currently taking multiple medications, including nifedipine, lovastatin, levothyroxine, labetalol, furosemide, and enalapril. He also uses Cialis for erectile dysfunction. His blood pressure is well-controlled on this regimen.  He monitors his blood sugar levels, which have been stable, ranging from 104 to 108. He recalls a recent increase in his GFR from his last blood test and mentions drinking more water to stay hydrated.  He underwent cataract surgery in August and October, resulting in improved vision of 20/20 and 20/30. He is concerned about an upcoming procedure for his other eye, which previously had a retina removed.  He received flu and COVID vaccinations in November and December, respectively.      Patient Active Problem List   Diagnosis Date Noted   Decreased GFR 02/06/2022   Type 2 diabetes mellitus with diabetic peripheral angiopathy without gangrene, without long-term current use of insulin (HCC) 04/19/2021   MDS (myelodysplastic syndrome), low grade (HCC) 04/26/2016   Pharyngoesophageal dysphagia 12/03/2014   Odynophagia 12/03/2014   Neutropenia (HCC) 02/04/2014   Routine history and physical examination of adult 08/01/2011    Psoriasis 05/31/2010   CHEST WALL PAIN, ACUTE 04/12/2010   Hypogonadism male 02/12/2007   Hypothyroidism 10/06/2006   Diabetes mellitus, type 2 (HCC) 10/06/2006   Hyperlipidemia 10/06/2006   Primary hypertension 10/06/2006   Gastroesophageal reflux disease without esophagitis 10/06/2006   Diet-controlled diabetes mellitus (HCC) 10/06/2006   Anemia 10/06/2006   Past Medical History:  Diagnosis Date   Arthritis    in neck   CVA (cerebral infarction) 1996   GERD (gastroesophageal reflux disease)    History of prediabetes    Hyperlipidemia    Hypertension    MDS (myelodysplastic syndrome), low grade (HCC) 04/26/2016   Neutropenia (HCC)    Psoriasis    Thyroid disease    Past Surgical History:  Procedure Laterality Date   CATARACT EXTRACTION Left 10/26/2022   [facility]   CLEFT PALATE REPAIR  1940's   left    Social History   Tobacco Use   Smoking status: Former   Smokeless tobacco: Never   Tobacco comments:    quit 1970  Vaping Use   Vaping status: Never Used  Substance Use Topics   Alcohol use: Yes    Alcohol/week: 0.0 standard drinks of alcohol    Comment: occasional wine   Drug use: No   Social History   Socioeconomic History   Marital status: Married    Spouse name: Not on file   Number of children: 3   Years of education: Not on file   Highest education level: Master's degree (e.g., MA, MS, MEng, MEd, MSW, MBA)  Occupational History   Not on file  Tobacco Use   Smoking  status: Former   Smokeless tobacco: Never   Tobacco comments:    quit 1970  Vaping Use   Vaping status: Never Used  Substance and Sexual Activity   Alcohol use: Yes    Alcohol/week: 0.0 standard drinks of alcohol    Comment: occasional wine   Drug use: No   Sexual activity: Never  Other Topics Concern   Not on file  Social History Narrative   Not on file   Social Drivers of Health   Financial Resource Strain: Low Risk  (05/06/2023)   Overall Financial Resource Strain (CARDIA)     Difficulty of Paying Living Expenses: Not hard at all  Food Insecurity: No Food Insecurity (05/06/2023)   Hunger Vital Sign    Worried About Running Out of Food in the Last Year: Never true    Ran Out of Food in the Last Year: Never true  Transportation Needs: No Transportation Needs (05/06/2023)   PRAPARE - Administrator, Civil Service (Medical): No    Lack of Transportation (Non-Medical): No  Physical Activity: Insufficiently Active (05/06/2023)   Exercise Vital Sign    Days of Exercise per Week: 2 days    Minutes of Exercise per Session: 30 min  Stress: No Stress Concern Present (05/06/2023)   [employer] of Occupational Health - Occupational Stress Questionnaire    Feeling of Stress : Not at all  Social Connections: Socially Integrated (05/06/2023)   Social Connection and Isolation Panel [NHANES]    Frequency of Communication with Friends and Family: Three times a week    Frequency of Social Gatherings with Friends and Family: Once a week    Attends Religious Services: More than 4 times per year    Active Member of [employer] or Organizations: Yes    Attends Engineer, structural: More than 4 times per year    Marital Status: Married  Catering manager Violence: Not on file   Family Status  Relation Name Status   Mother  Deceased   Father  Deceased   Sister carolyn Deceased   Sister emma Alive   Nurse, mental health  (Not Specified)   Mat Uncle Jerly Alive   Neg Hx  (Not Specified)  No partnership data on file   Family History  Problem Relation Age of Onset   Hypertension Mother    Hypertension Father    Stroke Father        died from it    Diabetes Sister    Pancreatic cancer Sister    Cancer Maternal Uncle        all 4 had cancer. One was brain cancer   Diabetes Maternal Uncle    Colon cancer Neg Hx    Thyroid disease Neg Hx    No Known Allergies    Review of Systems  Constitutional:  Negative for chills, fever and malaise/fatigue.  HENT:  Negative for  congestion and hearing loss.   Eyes:  Negative for blurred vision and discharge.  Respiratory:  Negative for cough, sputum production and shortness of breath.   Cardiovascular:  Negative for chest pain, palpitations and leg swelling.  Gastrointestinal:  Negative for abdominal pain, blood in stool, constipation, diarrhea, heartburn, nausea and vomiting.  Genitourinary:  Negative for dysuria, frequency, hematuria and urgency.  Musculoskeletal:  Negative for back pain, falls and myalgias.  Skin:  Negative for rash.  Neurological:  Negative for dizziness, sensory change, loss of consciousness, weakness and headaches.  Endo/Heme/Allergies:  Negative for environmental allergies. Does not  bruise/bleed easily.  Psychiatric/Behavioral:  Negative for depression and suicidal ideas. The patient is not nervous/anxious and does not have insomnia.       Objective:     BP 110/68 (BP Location: Left Arm, Patient Position: Sitting)   Pulse (!) 57   Temp 97.8 F (36.6 C) (Oral)   Resp 18   Ht 5\' 8"  (1.727 m)   Wt 189 lb 12.8 oz (86.1 kg)   SpO2 98%   BMI 28.86 kg/m  BP Readings from Last 3 Encounters:  05/08/23 110/68  02/05/23 107/65  10/30/22 130/80   Wt Readings from Last 3 Encounters:  05/08/23 189 lb 12.8 oz (86.1 kg)  02/05/23 190 lb (86.2 kg)  10/30/22 188 lb 6.4 oz (85.5 kg)   SpO2 Readings from Last 3 Encounters:  05/08/23 98%  02/05/23 96%  10/30/22 97%      Physical Exam Vitals and nursing note reviewed.  Constitutional:      General: He is not in acute distress.    Appearance: Normal appearance. He is well-developed.  HENT:     Head: Normocephalic and atraumatic.  Eyes:     General: No scleral icterus.       Right eye: No discharge.        Left eye: No discharge.  Cardiovascular:     Rate and Rhythm: Normal rate and regular rhythm.     Heart sounds: No murmur heard. Pulmonary:     Effort: Pulmonary effort is normal. No respiratory distress.     Breath sounds:  Normal breath sounds.  Musculoskeletal:        General: Normal range of motion.     Cervical back: Normal range of motion and neck supple.     Right lower leg: No edema.     Left lower leg: No edema.  Skin:    General: Skin is warm and dry.  Neurological:     Mental Status: He is alert and oriented to person, place, and time.  Psychiatric:        Mood and Affect: Mood normal.        Behavior: Behavior normal.        Thought Content: Thought content normal.        Judgment: Judgment normal.      No results found for any visits on 05/08/23.  Last CBC Lab Results  Component Value Date   WBC 2.1 (L) 02/05/2023   HGB 14.1 02/05/2023   HCT 40.2 02/05/2023   MCV 99.5 02/05/2023   MCH 34.9 (H) 02/05/2023   RDW 13.4 02/05/2023   PLT 166 02/05/2023   Last metabolic panel Lab Results  Component Value Date   GLUCOSE 164 (H) 02/05/2023   NA 139 02/05/2023   K 4.9 02/05/2023   CL 102 02/05/2023   CO2 32 02/05/2023   BUN 15 02/05/2023   CREATININE 1.27 (H) 02/05/2023   GFRNONAA 56 (L) 02/05/2023   CALCIUM 9.4 02/05/2023   PROT 7.6 02/05/2023   ALBUMIN 4.5 02/05/2023   BILITOT 0.8 02/05/2023   ALKPHOS 75 02/05/2023   AST 23 02/05/2023   ALT 17 02/05/2023   ANIONGAP 5 02/05/2023   Last lipids Lab Results  Component Value Date   CHOL 95 10/30/2022   HDL 31.30 (L) 10/30/2022   LDLCALC 53 10/30/2022   TRIG 54.0 10/30/2022   CHOLHDL 3 10/30/2022   Last hemoglobin A1c Lab Results  Component Value Date   HGBA1C 6.0 10/30/2022   Last thyroid functions Lab Results  Component Value Date   TSH 3.68 10/30/2022   Last vitamin D No results found for: "25OHVITD2", "25OHVITD3", "VD25OH" Last vitamin B12 and Folate Lab Results  Component Value Date   VITAMINB12 359 04/08/2007   FOLATE > 20.0 ng/mL 04/08/2007      The ASCVD Risk score (Arnett DK, et al., 2019) failed to calculate for the following reasons:   The 2019 ASCVD risk score is only valid for ages 28 to 39     Assessment & Plan:   Problem List Items Addressed This Visit       Unprioritized   Psoriasis   MDS (myelodysplastic syndrome), low grade (HCC) (Chronic)   Relevant Orders   CBC with Differential/Platelet   Hypothyroidism   Con't synthroid       Relevant Medications   labetalol (NORMODYNE) 100 MG tablet   levothyroxine (SYNTHROID) 75 MCG tablet   Other Relevant Orders   TSH   Hyperlipidemia   Encourage heart healthy diet such as MIND or DASH diet, increase exercise, avoid trans fats, simple carbohydrates and processed foods, consider a krill or fish or flaxseed oil cap daily.        Relevant Medications   enalapril (VASOTEC) 10 MG tablet   furosemide (LASIX) 20 MG tablet   labetalol (NORMODYNE) 100 MG tablet   lovastatin (MEVACOR) 20 MG tablet   NIFEdipine (PROCARDIA XL/NIFEDICAL XL) 60 MG 24 hr tablet   tadalafil (CIALIS) 20 MG tablet   Other Relevant Orders   Lipid panel   Comprehensive metabolic panel   Diet-controlled diabetes mellitus (HCC)   hgba1c to be checked , minimize simple carbs. Increase exercise as tolerated. Continue current meds       Relevant Medications   enalapril (VASOTEC) 10 MG tablet   lovastatin (MEVACOR) 20 MG tablet   glucose blood (FREESTYLE PRECISION NEO TEST) test strip   Other Relevant Orders   Lipid panel   CBC with Differential/Platelet   Comprehensive metabolic panel   Hemoglobin A1c   Microalbumin / creatinine urine ratio   Other Visit Diagnoses       Essential hypertension    -  Primary   Relevant Medications   enalapril (VASOTEC) 10 MG tablet   furosemide (LASIX) 20 MG tablet   labetalol (NORMODYNE) 100 MG tablet   lovastatin (MEVACOR) 20 MG tablet   NIFEdipine (PROCARDIA XL/NIFEDICAL XL) 60 MG 24 hr tablet   tadalafil (CIALIS) 20 MG tablet   Other Relevant Orders   Lipid panel   CBC with Differential/Platelet   Comprehensive metabolic panel   Hemoglobin A1c   Microalbumin / creatinine urine ratio     Erectile  dysfunction, unspecified erectile dysfunction type       Relevant Medications   tadalafil (CIALIS) 20 MG tablet     Assessment and Plan    Erectile Dysfunction (ED)   Discussed treatment options for ED. He requested a prescription for Cialis to discuss with his doctor. Reviewed benefits of Cialis, including improved erectile function, and risks such as headache, flushing, and potential drug interactions. Print prescription for Cialis.  Hypertension   Blood pressure is well-controlled on nifedipine, labetalol, and enalapril. Emphasized adherence to medication regimen and regular blood pressure monitoring. Continue current antihypertensive medications.  Heart Failure   On furosemide for heart failure management. Emphasized monitoring for signs of fluid overload and adherence to medication. Continue furosemide.  Hyperlipidemia   On lovastatin for hyperlipidemia. Emphasized adherence to medication and regular lipid panel monitoring. Continue lovastatin.  Hypothyroidism  On levothyroxine for hypothyroidism. Emphasized regular thyroid function tests to ensure appropriate dosing. Continue levothyroxine.  General Health Maintenance   Due for tetanus shot. Received flu shot in November and COVID booster in December. Discussed the importance of regular exercise for overall health and chronic condition management. Get tetanus shot at the pharmacy. Encourage regular exercise, aiming for 30 minutes, five days a week.        Return in about 6 months (around 11/05/2023), or if symptoms worsen or fail to improve, for annual exam, fasting.    Donato Schultz, DO

## 2023-05-09 ENCOUNTER — Encounter: Payer: Self-pay | Admitting: Family Medicine

## 2023-05-10 LAB — MICROALBUMIN / CREATININE URINE RATIO
Creatinine,U: 8.6 mg/dL
Microalb Creat Ratio: 81 mg/g — ABNORMAL HIGH (ref 0.0–30.0)
Microalb, Ur: <0.7 mg/dL (ref 0.0–1.9)

## 2023-05-16 ENCOUNTER — Other Ambulatory Visit: Payer: Self-pay | Admitting: Family Medicine

## 2023-05-16 DIAGNOSIS — E1151 Type 2 diabetes mellitus with diabetic peripheral angiopathy without gangrene: Secondary | ICD-10-CM

## 2023-06-30 ENCOUNTER — Encounter: Payer: Self-pay | Admitting: Family Medicine

## 2023-07-04 ENCOUNTER — Other Ambulatory Visit: Payer: Self-pay | Admitting: Family Medicine

## 2023-07-16 DIAGNOSIS — E291 Testicular hypofunction: Secondary | ICD-10-CM | POA: Diagnosis not present

## 2023-07-30 ENCOUNTER — Other Ambulatory Visit (INDEPENDENT_AMBULATORY_CARE_PROVIDER_SITE_OTHER)

## 2023-07-30 DIAGNOSIS — E1151 Type 2 diabetes mellitus with diabetic peripheral angiopathy without gangrene: Secondary | ICD-10-CM

## 2023-07-30 LAB — MICROALBUMIN / CREATININE URINE RATIO
Creatinine,U: 94.6 mg/dL
Microalb Creat Ratio: 7.6 mg/g (ref 0.0–30.0)
Microalb, Ur: 0.7 mg/dL (ref 0.0–1.9)

## 2023-07-30 NOTE — Progress Notes (Signed)
 Specimen provided

## 2023-07-31 ENCOUNTER — Encounter: Payer: Self-pay | Admitting: Family Medicine

## 2023-08-06 ENCOUNTER — Encounter: Payer: Self-pay | Admitting: Hematology & Oncology

## 2023-08-06 ENCOUNTER — Inpatient Hospital Stay (HOSPITAL_BASED_OUTPATIENT_CLINIC_OR_DEPARTMENT_OTHER): Payer: PRIVATE HEALTH INSURANCE | Admitting: Hematology & Oncology

## 2023-08-06 ENCOUNTER — Other Ambulatory Visit: Payer: Self-pay

## 2023-08-06 ENCOUNTER — Inpatient Hospital Stay: Payer: PRIVATE HEALTH INSURANCE | Attending: Hematology & Oncology

## 2023-08-06 VITALS — BP 118/68 | HR 55 | Temp 97.4°F | Resp 19 | Wt 193.0 lb

## 2023-08-06 DIAGNOSIS — D4620 Refractory anemia with excess of blasts, unspecified: Secondary | ICD-10-CM | POA: Insufficient documentation

## 2023-08-06 DIAGNOSIS — D46Z Other myelodysplastic syndromes: Secondary | ICD-10-CM

## 2023-08-06 DIAGNOSIS — D72819 Decreased white blood cell count, unspecified: Secondary | ICD-10-CM | POA: Diagnosis not present

## 2023-08-06 LAB — CBC WITH DIFFERENTIAL (CANCER CENTER ONLY)
Abs Immature Granulocytes: 0.02 10*3/uL (ref 0.00–0.07)
Basophils Absolute: 0 10*3/uL (ref 0.0–0.1)
Basophils Relative: 0 %
Eosinophils Absolute: 0 10*3/uL (ref 0.0–0.5)
Eosinophils Relative: 2 %
HCT: 41.7 % (ref 39.0–52.0)
Hemoglobin: 14.3 g/dL (ref 13.0–17.0)
Immature Granulocytes: 1 %
Lymphocytes Relative: 42 %
Lymphs Abs: 1.1 10*3/uL (ref 0.7–4.0)
MCH: 34.2 pg — ABNORMAL HIGH (ref 26.0–34.0)
MCHC: 34.3 g/dL (ref 30.0–36.0)
MCV: 99.8 fL (ref 80.0–100.0)
Monocytes Absolute: 0.2 10*3/uL (ref 0.1–1.0)
Monocytes Relative: 7 %
Neutro Abs: 1.3 10*3/uL — ABNORMAL LOW (ref 1.7–7.7)
Neutrophils Relative %: 48 %
Platelet Count: 143 10*3/uL — ABNORMAL LOW (ref 150–400)
RBC: 4.18 MIL/uL — ABNORMAL LOW (ref 4.22–5.81)
RDW: 14.1 % (ref 11.5–15.5)
WBC Count: 2.7 10*3/uL — ABNORMAL LOW (ref 4.0–10.5)
nRBC: 0 % (ref 0.0–0.2)

## 2023-08-06 LAB — CMP (CANCER CENTER ONLY)
ALT: 19 U/L (ref 0–44)
AST: 23 U/L (ref 15–41)
Albumin: 4.7 g/dL (ref 3.5–5.0)
Alkaline Phosphatase: 72 U/L (ref 38–126)
Anion gap: 6 (ref 5–15)
BUN: 14 mg/dL (ref 8–23)
CO2: 32 mmol/L (ref 22–32)
Calcium: 9.3 mg/dL (ref 8.9–10.3)
Chloride: 100 mmol/L (ref 98–111)
Creatinine: 1.29 mg/dL — ABNORMAL HIGH (ref 0.61–1.24)
GFR, Estimated: 55 mL/min — ABNORMAL LOW (ref >60)
Glucose, Bld: 140 mg/dL — ABNORMAL HIGH (ref 70–99)
Potassium: 4.1 mmol/L (ref 3.5–5.1)
Sodium: 138 mmol/L (ref 135–145)
Total Bilirubin: 0.7 mg/dL (ref 0.0–1.2)
Total Protein: 7.8 g/dL (ref 6.5–8.1)

## 2023-08-06 LAB — SAVE SMEAR(SSMR), FOR PROVIDER SLIDE REVIEW

## 2023-08-06 LAB — LACTATE DEHYDROGENASE: LDH: 207 U/L — ABNORMAL HIGH (ref 98–192)

## 2023-08-06 NOTE — Progress Notes (Signed)
 Hematology and Oncology Follow Up Visit  WYLLIAM HANEK [id number] 04/21/1940 83 y.o. 08/06/2023   Principle Diagnosis:  Myelodysplastic syndrome-low-grade (IPSS-R score = 1) - tp53+  Current Therapy:   Observation     Interim History:  Mr. Armand is back for follow-up.  We see him every 6 months.  He made it through all the holidays.  He and his wife will be going to [place] this weekend.  They know to be careful when they drive.  They will get out every couple hours and walk around.  Thankfully, he has had no problems with infections.  He has had no fever.  He has had no rashes.  He has occasional nosebleed.  I told him to try some Vaseline to help moisten the mucous membranes.  He has had no change in bowel or bladder habits.  He has had no cough.  Has had no swollen lymph nodes.  He has had a little bit of leg swelling which might be chronic.  His last TSH back in February was 3.8.  Currently, I would have to say that his performance status is probably ECOG 1.   Medications:  Current Outpatient Medications:    B Complex-Biotin-FA (B-50 COMPLEX PO), Take 1 tablet by mouth daily., Disp: , Rfl:    calcipotriene  (DOVONOX) 0.005 % cream, Apply topically to aa of psoriasis qd/bid prn, Disp: 60 g, Rfl: 11   Calcipotriene  0.005 % solution, Apply 1 Application topically every morning. Qam to scalp for psoriasis, Disp: 60 mL, Rfl: 11   calcipotriene -betamethasone (TACLONEX SCALP) external suspension, Apply topically daily. For psoriasis at scalp (Patient not taking: Reported on 05/08/2023), Disp: 60 g, Rfl: 11   Calcitriol 3 MCG/GM cream, Apply topically., Disp: , Rfl:    Calcium Carb-Cholecalciferol (CALCIUM 500+D) 500-10 MG-MCG TABS, Take 1 tablet by mouth daily., Disp: , Rfl:    Calcium Carbonate POWD, Take by mouth., Disp: , Rfl:    Cholecalciferol (VITAMIN D ) 2000 units CAPS, Take 2,000 Units by mouth daily., Disp: , Rfl:    desonide  (DESOWEN ) 0.05 % cream, Apply topically as  directed. Qam 5 days a week to aa psoriasis on body until clear, then prn flares, Disp: 60 g, Rfl: 6   Emollient (EUCERIN) lotion, Apply topically as needed for dry skin., Disp: , Rfl:    enalapril  (VASOTEC ) 10 MG tablet, Take 1 tablet (10 mg total) by mouth daily., Disp: 90 tablet, Rfl: 3   furosemide  (LASIX ) 20 MG tablet, Take 1 tablet (20 mg total) by mouth daily., Disp: 90 tablet, Rfl: 1   glucose blood (FREESTYLE PRECISION NEO TEST) test strip, Check glucose 1 time daily dx:E11.9, Disp: 100 each, Rfl: 4   labetalol  (NORMODYNE ) 100 MG tablet, Take 1 tablet (100 mg total) by mouth 2 (two) times daily., Disp: 180 tablet, Rfl: 3   levothyroxine  (SYNTHROID ) 75 MCG tablet, Take 1 tablet (75 mcg total) by mouth daily., Disp: 90 tablet, Rfl: 3   lovastatin  (MEVACOR ) 20 MG tablet, Take 1 tablet (20 mg total) by mouth at bedtime., Disp: 90 tablet, Rfl: 3   mometasone  (ELOCON ) 0.1 % lotion, Apply topically as directed. Apply to itchy scalp 3-5 nights a week prn itch for psoriasis, Disp: 60 mL, Rfl: 6   Multiple Vitamins-Minerals (MULTIVITAMIN WITH MINERALS) tablet, Take 1 tablet by mouth daily. CitraVit, Disp: , Rfl:    NIFEdipine  (PROCARDIA  XL/NIFEDICAL XL) 60 MG 24 hr tablet, Take 1 tablet (60 mg total) by mouth daily., Disp: 90 tablet, Rfl: 3  omeprazole (PRILOSEC) 20 MG capsule, Take 1 capsule by mouth daily., Disp: , Rfl:    tadalafil  (CIALIS ) 20 MG tablet, Take 0.5-1 tablets (10-20 mg total) by mouth every other day as needed for erectile dysfunction., Disp: 10 tablet, Rfl: 11   Testosterone  Enanthate (XYOSTED) 75 MG/0.5ML SOAJ, Inject into the skin., Disp: , Rfl:    Zinc 50 MG CAPS, Take 1 capsule by mouth daily., Disp: , Rfl:   Allergies: No Known Allergies  Past Medical History, Surgical history, Social history, and Family History were reviewed and updated.  Review of Systems: Review of Systems  Constitutional: Negative.   HENT: Negative.    Eyes: Negative.   Respiratory: Negative.     Cardiovascular: Negative.   Gastrointestinal: Negative.   Genitourinary: Negative.   Musculoskeletal: Negative.   Skin: Negative.   Neurological: Negative.   Endo/Heme/Allergies: Negative.   Psychiatric/Behavioral: Negative.       Physical Exam: Vital signs are temperature 97.4.  Pulse 55.  Blood pressure 118/68.  Weight is 193 pounds.  Wt Readings from Last 3 Encounters:  05/08/23 189 lb 12.8 oz (86.1 kg)  02/05/23 190 lb (86.2 kg)  10/30/22 188 lb 6.4 oz (85.5 kg)     Physical Exam Vitals reviewed.  HENT:     Head: Normocephalic and atraumatic.  Eyes:     Pupils: Pupils are equal, round, and reactive to light.  Cardiovascular:     Rate and Rhythm: Normal rate and regular rhythm.     Heart sounds: Normal heart sounds.  Pulmonary:     Effort: Pulmonary effort is normal.     Breath sounds: Normal breath sounds.  Abdominal:     General: Bowel sounds are normal.     Palpations: Abdomen is soft.  Musculoskeletal:        General: No tenderness or deformity. Normal range of motion.     Cervical back: Normal range of motion.  Lymphadenopathy:     Cervical: No cervical adenopathy.  Skin:    General: Skin is warm and dry.     Findings: No erythema or rash.  Neurological:     Mental Status: He is alert and oriented to person, place, and time.  Psychiatric:        Behavior: Behavior normal.        Thought Content: Thought content normal.        Judgment: Judgment normal.      Lab Results  Component Value Date   WBC 2.7 (L) 08/06/2023   HGB 14.3 08/06/2023   HCT 41.7 08/06/2023   MCV 99.8 08/06/2023   PLT 143 (L) 08/06/2023     Chemistry      Component Value Date/Time   NA 137 05/08/2023 1130   NA 144 04/26/2016 1117   K 4.0 05/08/2023 1130   K 3.9 04/26/2016 1117   CL 99 05/08/2023 1130   CL 98 04/26/2016 1117   CO2 32 05/08/2023 1130   CO2 18 04/26/2016 1117   BUN 12 05/08/2023 1130   BUN 13 04/26/2016 1117   CREATININE 1.08 05/08/2023 1130    CREATININE 1.27 (H) 02/05/2023 0851   CREATININE 1.2 04/26/2016 1117      Component Value Date/Time   CALCIUM 8.6 05/08/2023 1130   CALCIUM 9.1 04/26/2016 1117   ALKPHOS 80 05/08/2023 1130   ALKPHOS 99 (H) 04/26/2016 1117   AST 28 05/08/2023 1130   AST 23 02/05/2023 0851   ALT 26 05/08/2023 1130   ALT 17 02/05/2023 0851  ALT 22 04/26/2016 1117   BILITOT 0.9 05/08/2023 1130   BILITOT 0.8 02/05/2023 0851      Impression and Plan: Mr. Hettich is a 83 year old white male. He has myelodysplasia. This is manifested as leukopenia.   I looked at his blood under the microscope.  I did not see any immature myeloid cells.  He has no nucleated red blood cells.  His platelets look fine and are well granulated.  He had no blasts.  Everything really is quite stable for him.  His white cell count is actually a little bit better.  For right now, there is no need to make a change in our follow-up.  We are doing active surveillance.  We will plan to get him back in 6 months.   Ivor Mars, MD 5/12/20259:06 AM

## 2023-08-21 ENCOUNTER — Other Ambulatory Visit: Payer: Self-pay

## 2023-08-21 MED ORDER — CALCIPOTRIENE 0.005 % EX SOLN: CUTANEOUS | 11 refills | Status: DC

## 2023-08-21 NOTE — Progress Notes (Signed)
 [facility] called requesting prescription be sent to their pharmacy, pt unsure where he had it filled in the past.

## 2023-10-30 ENCOUNTER — Other Ambulatory Visit: Payer: Self-pay | Admitting: Medical Genetics

## 2023-11-01 IMAGING — DX DG ANKLE COMPLETE 3+V*L*
3 series · 3 of 3 positions shown · non-contrast
Comparison: Left calcaneus radiographs 09/18/2013

CLINICAL DATA: Pain and swelling. Left foot and ankle pain and
swelling for 1 week.

EXAM:
LEFT ANKLE COMPLETE - 3+ VIEW

[ankle ap]
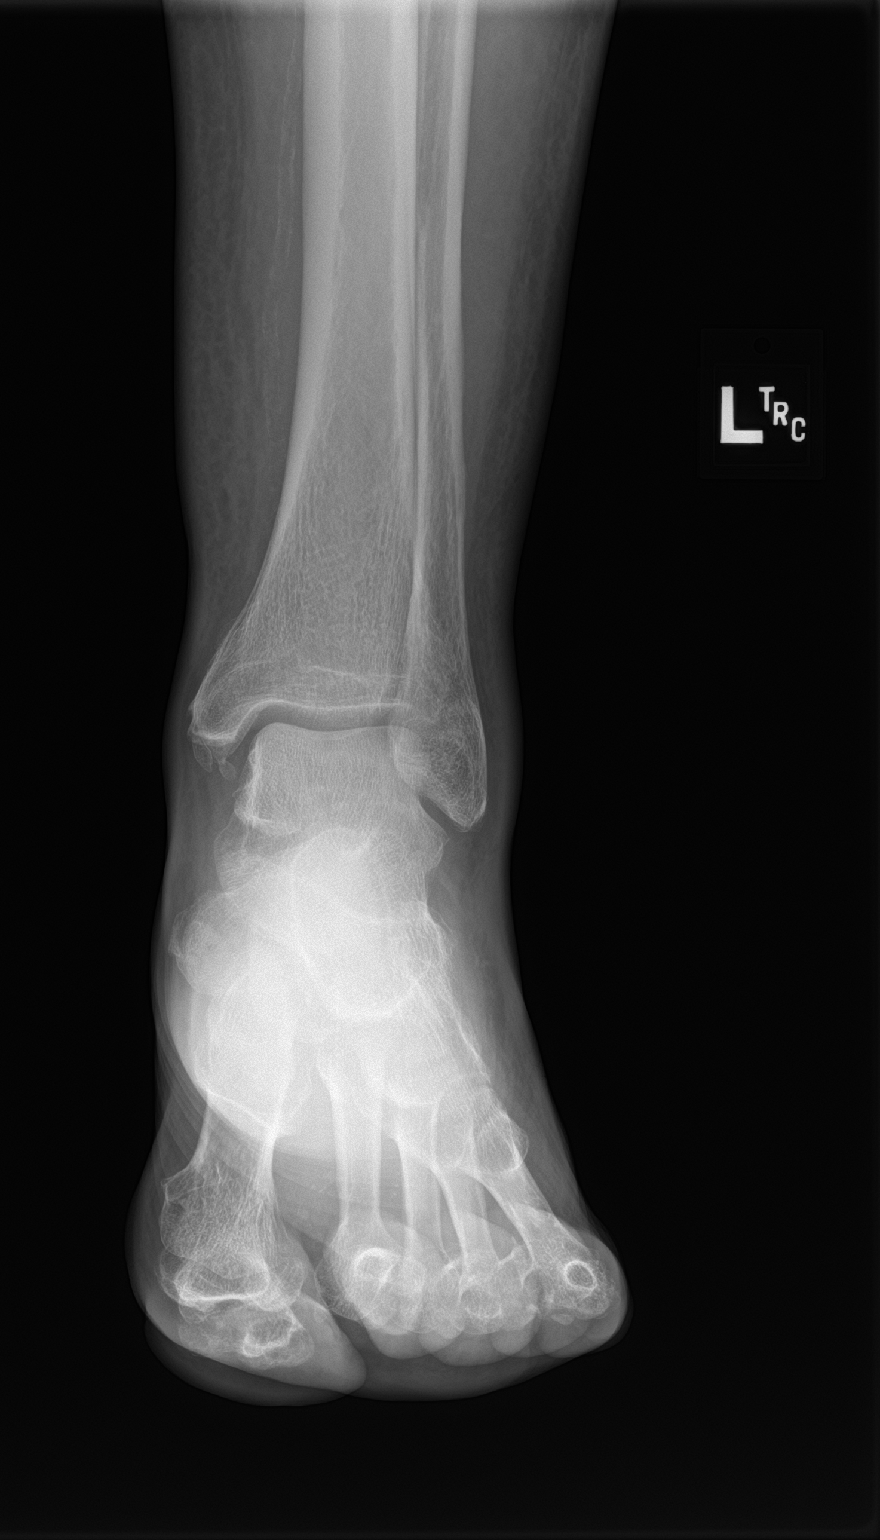

[ankle obl]
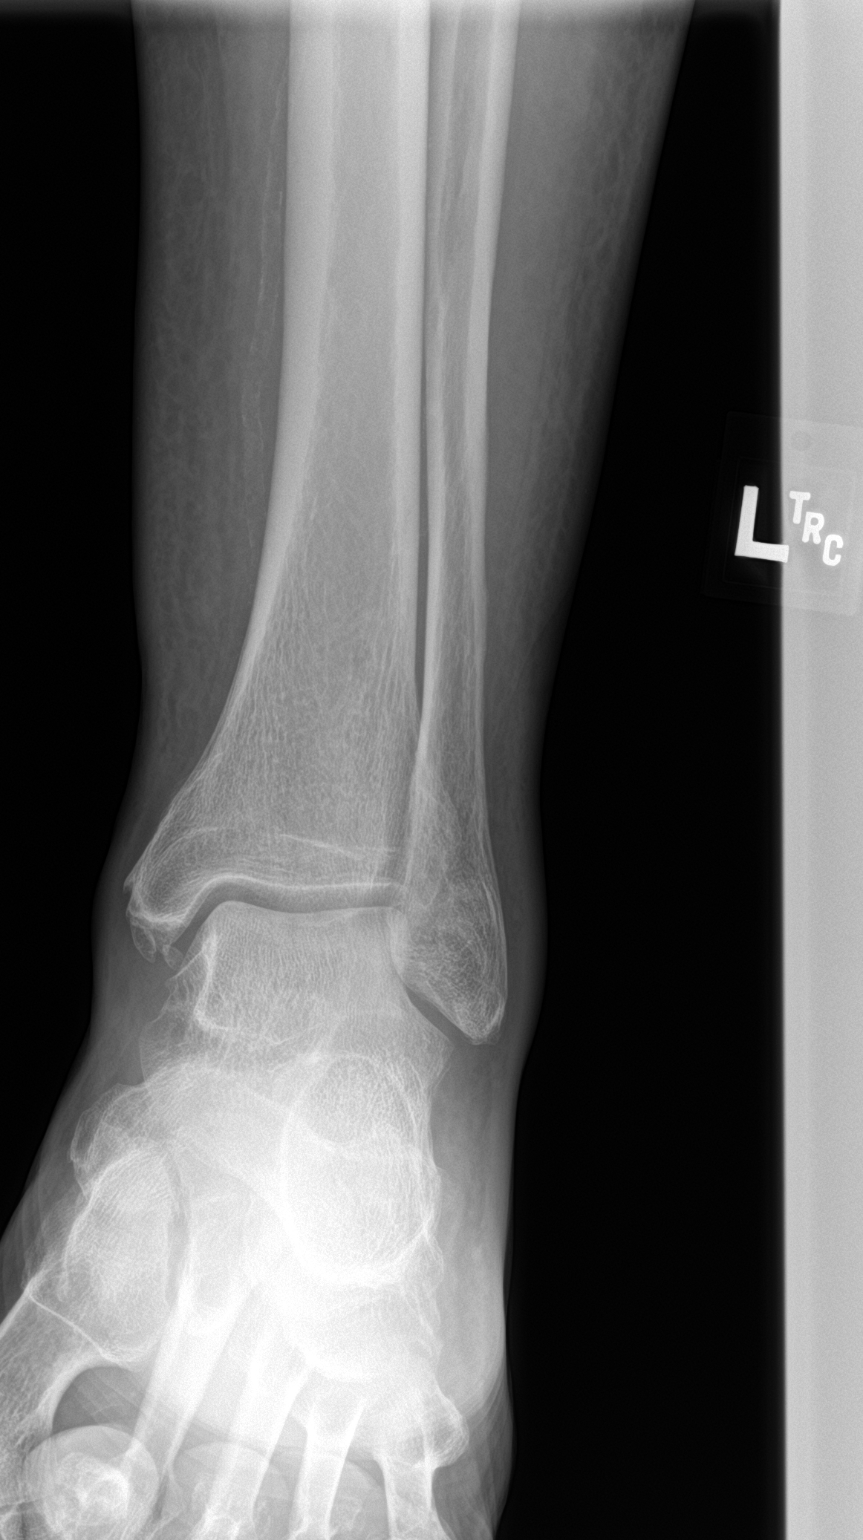

[ankle lat]
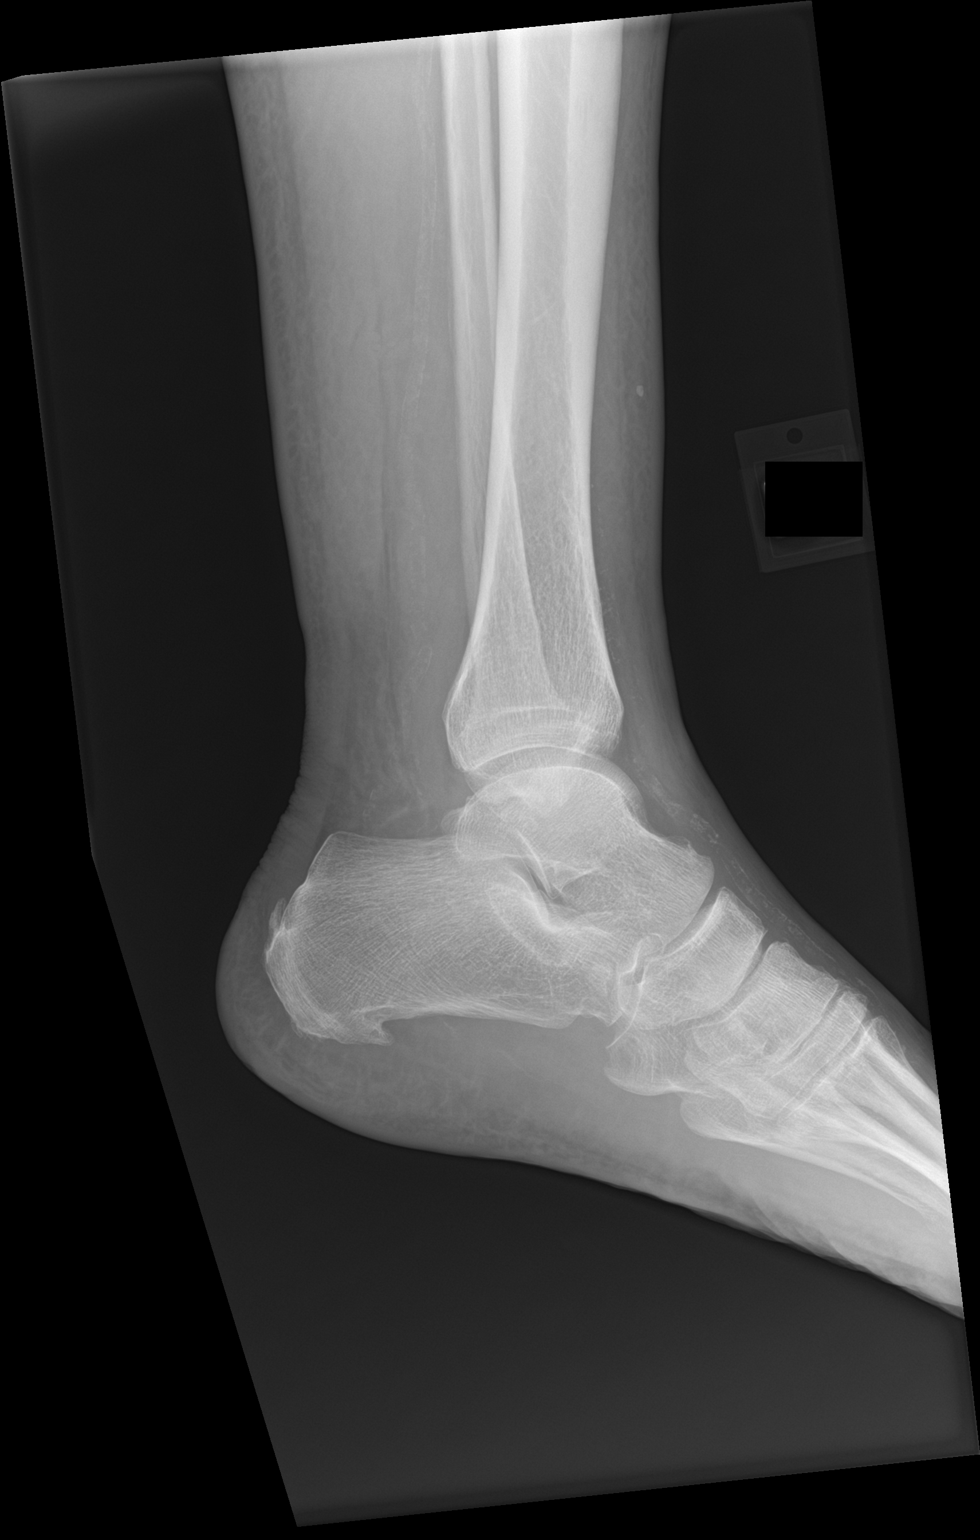

[3 of 3 positions shown; findings below may reference images not displayed]

FINDINGS: There 2 small chronic ossicles just distal to the medial malleolus
on frontal view. The ankle mortise remains symmetric and intact.
Small plantar calcaneal heel spur. Minimal chronic enthesopathic
change at the Achilles insertion on the calcaneus. No acute fracture
or dislocation. Vascular calcifications are noted.
IMPRESSION: Small plantar calcaneal heel spur.

No acute fracture.

## 2023-11-01 IMAGING — US US EXTREM LOW VENOUS*L*
1 series · 14 of 24 positions shown · non-contrast
Comparison: None.

CLINICAL DATA: Swelling and pain left leg.

EXAM:
Left LOWER EXTREMITY VENOUS DOPPLER ULTRASOUND
TECHNIQUE: Gray-scale sonography with compression, as well as color and duplex
ultrasound, were performed to evaluate the deep venous system(s)
from the level of the common femoral vein through the popliteal and
proximal calf veins.

[Series 1: us extrem low venous*left* · 14 of 42 slices shown]
[im 1/42]
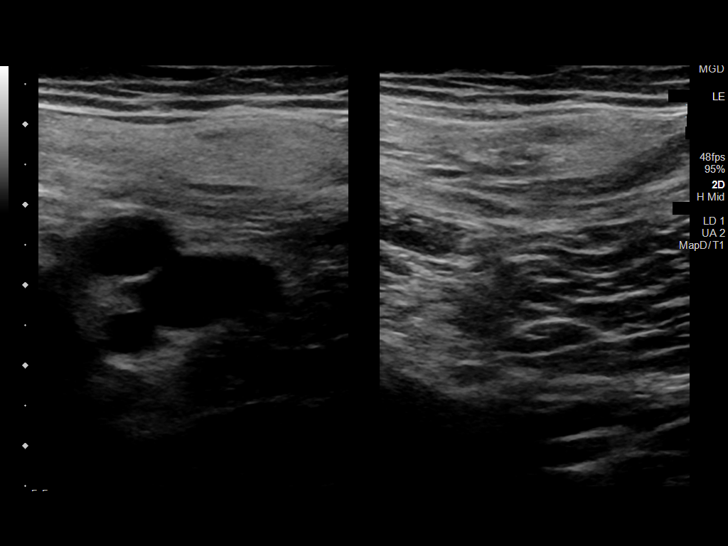
[im 4/42]
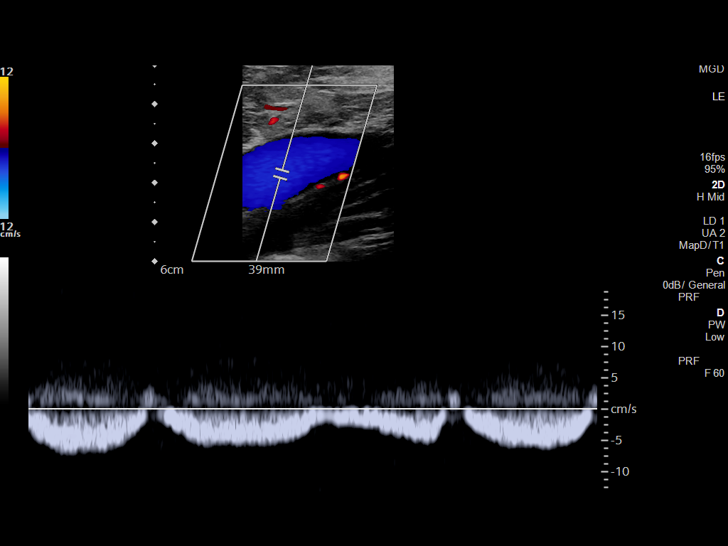
[im 8/42]
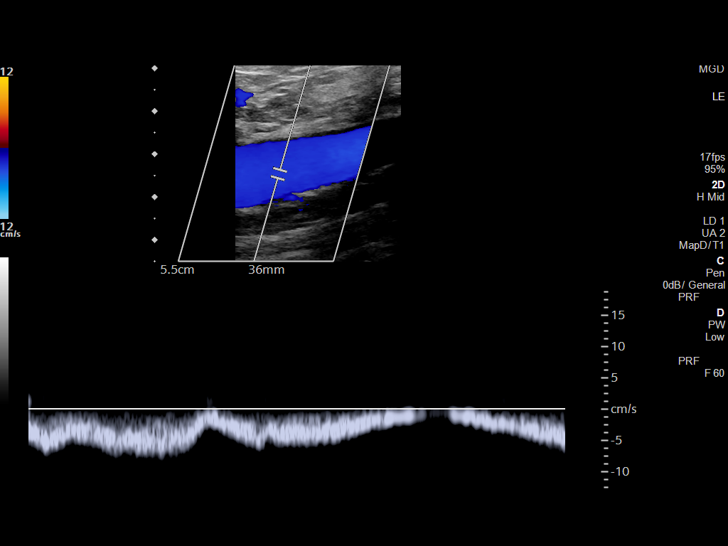
[im 11/42]
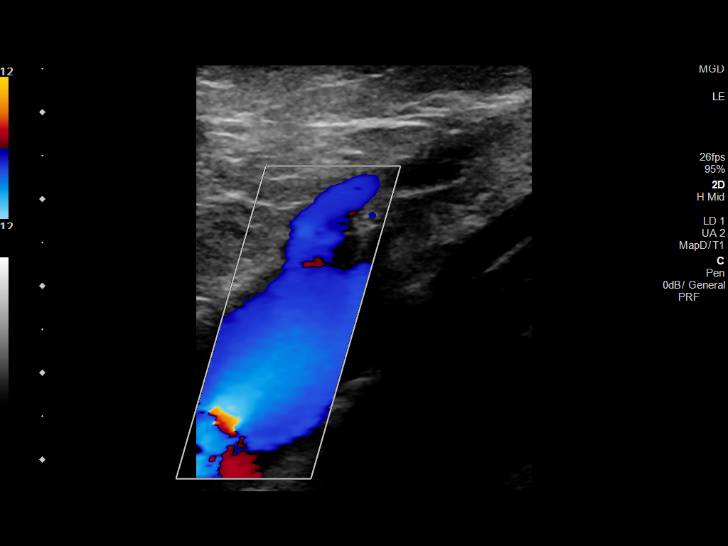
[im 13/42]
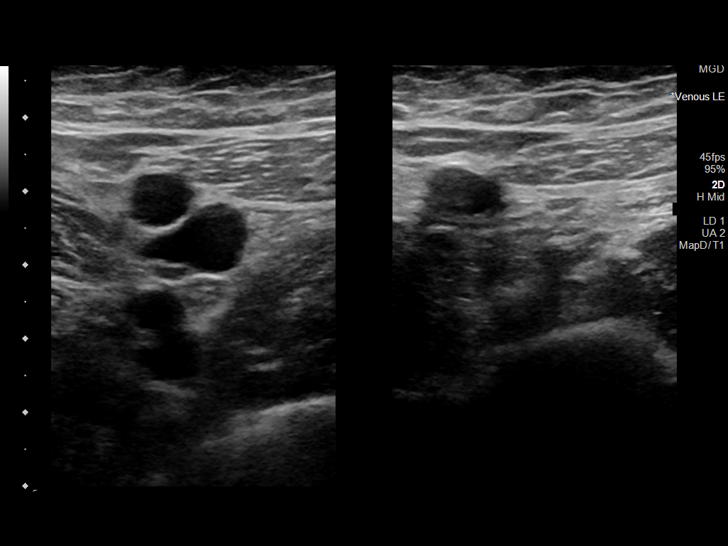
[im 17/42]
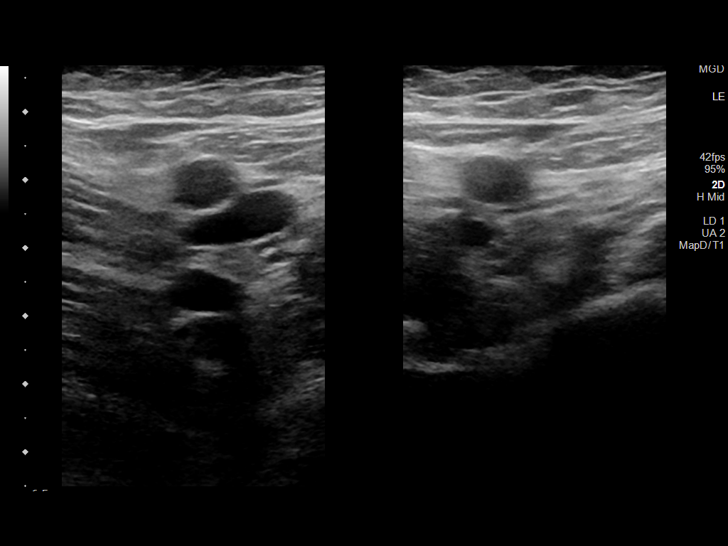
[im 20/42]
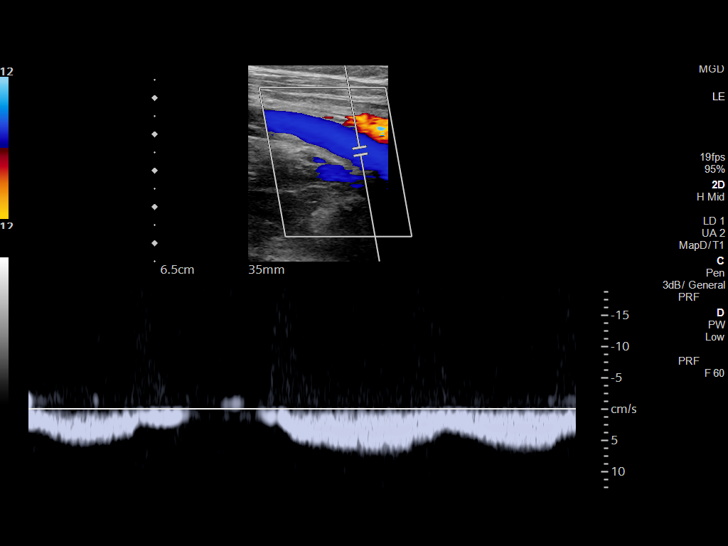
[im 22/42]
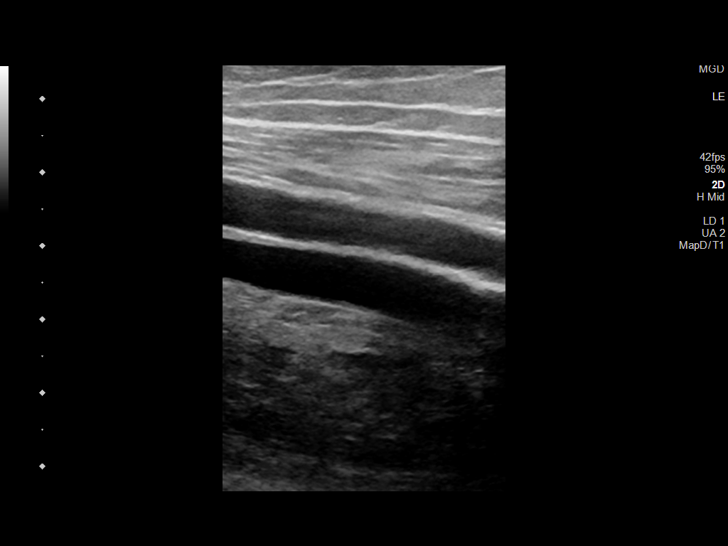
[im 25/42]
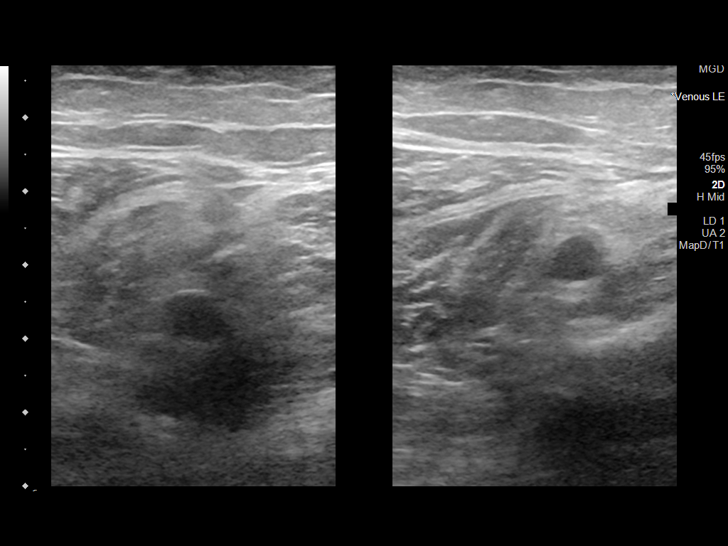
[im 29/42]
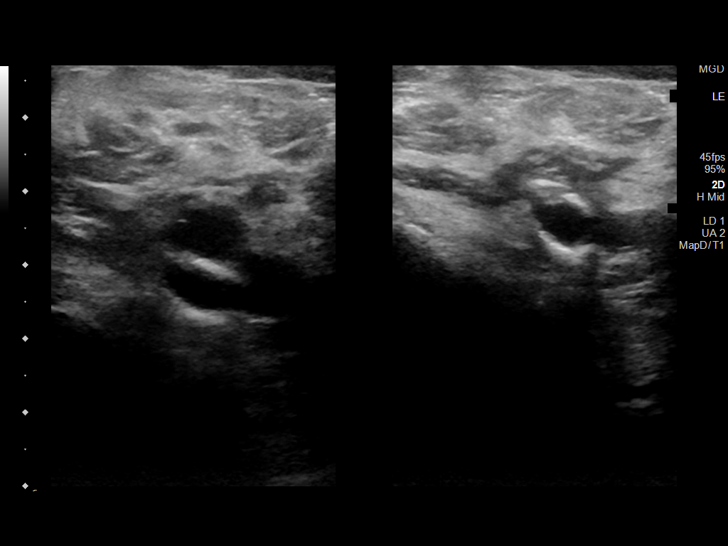
[im 33/42]
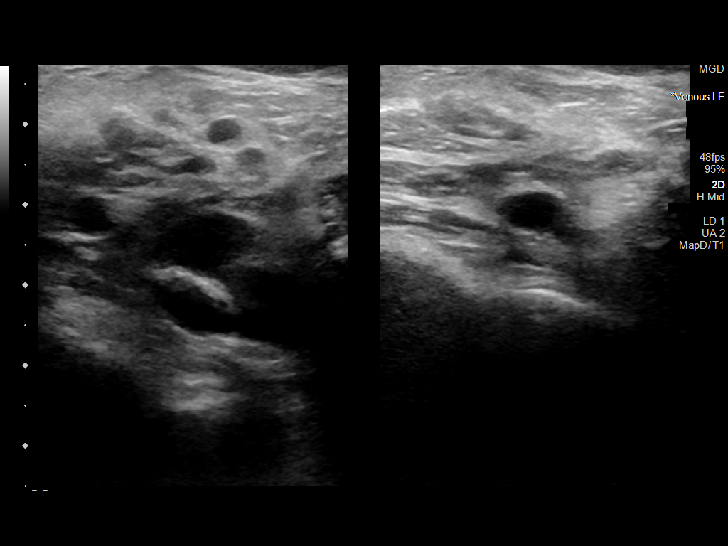
[im 34/42]
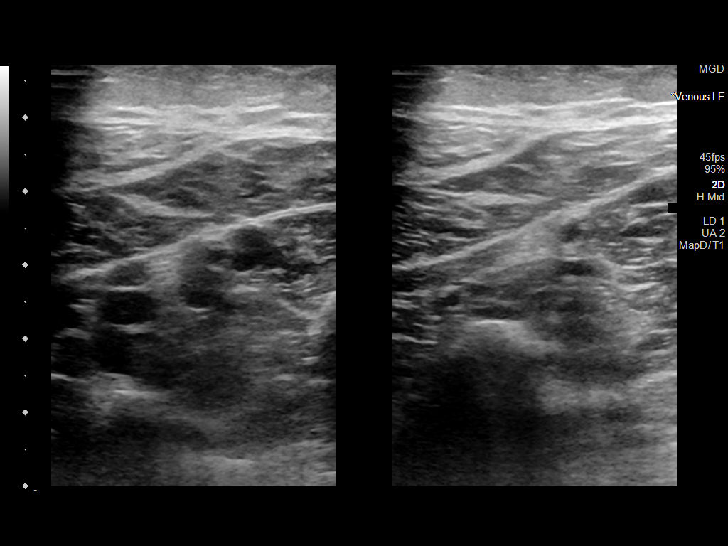
[im 38/42]
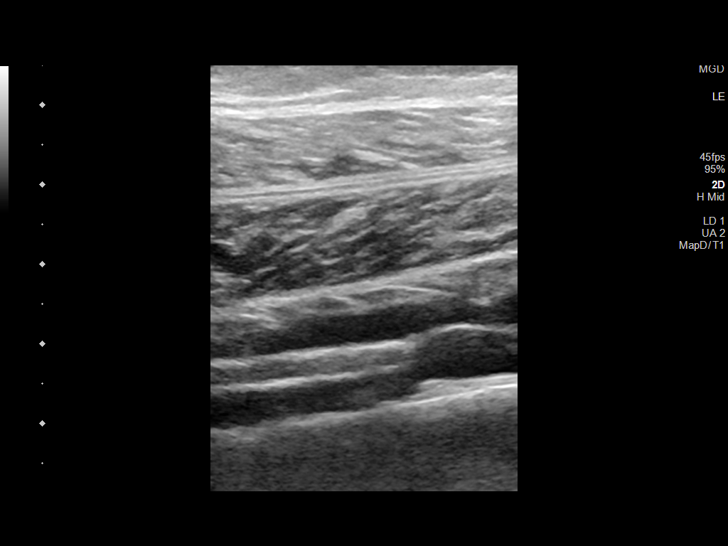
[im 42/42]
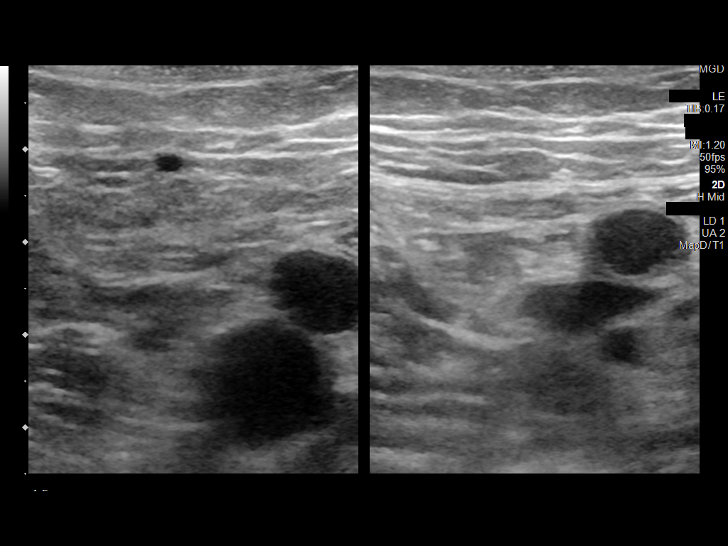

[14 of 24 positions shown; findings below may reference images not displayed]

FINDINGS: VENOUS

Normal compressibility of the common femoral, superficial femoral,
and popliteal veins, as well as the visualized calf veins.
Visualized portions of profunda femoral vein and great saphenous
vein unremarkable. No filling defects to suggest DVT on grayscale or
color Doppler imaging. Doppler waveforms show normal direction of
venous flow, normal respiratory plasticity and response to
augmentation.

Limited views of the contralateral common femoral vein are
unremarkable.

OTHER

None.

Limitations: none
IMPRESSION: No deep venous thrombosis of left lower extremity.

## 2023-11-05 ENCOUNTER — Encounter: Payer: Self-pay | Admitting: Family Medicine

## 2023-11-05 ENCOUNTER — Ambulatory Visit: Payer: Self-pay | Admitting: Family Medicine

## 2023-11-05 ENCOUNTER — Ambulatory Visit: Payer: PRIVATE HEALTH INSURANCE | Admitting: Family Medicine

## 2023-11-05 VITALS — BP 132/80 | HR 52 | Temp 97.7°F | Resp 18 | Ht 68.0 in | Wt 192.0 lb

## 2023-11-05 DIAGNOSIS — I10 Essential (primary) hypertension: Secondary | ICD-10-CM

## 2023-11-05 DIAGNOSIS — E119 Type 2 diabetes mellitus without complications: Secondary | ICD-10-CM | POA: Diagnosis not present

## 2023-11-05 DIAGNOSIS — N529 Male erectile dysfunction, unspecified: Secondary | ICD-10-CM | POA: Diagnosis not present

## 2023-11-05 DIAGNOSIS — E785 Hyperlipidemia, unspecified: Secondary | ICD-10-CM | POA: Diagnosis not present

## 2023-11-05 DIAGNOSIS — E039 Hypothyroidism, unspecified: Secondary | ICD-10-CM

## 2023-11-05 DIAGNOSIS — R350 Frequency of micturition: Secondary | ICD-10-CM | POA: Diagnosis not present

## 2023-11-05 LAB — CBC WITH DIFFERENTIAL/PLATELET
Basophils Absolute: 0 K/uL (ref 0.0–0.1)
Basophils Relative: 0.4 % (ref 0.0–3.0)
Eosinophils Absolute: 0 K/uL (ref 0.0–0.7)
Eosinophils Relative: 1.8 % (ref 0.0–5.0)
HCT: 39.5 % (ref 39.0–52.0)
Hemoglobin: 13.5 g/dL (ref 13.0–17.0)
Lymphocytes Relative: 41.2 % (ref 12.0–46.0)
Lymphs Abs: 0.9 K/uL (ref 0.7–4.0)
MCHC: 34.2 g/dL (ref 30.0–36.0)
MCV: 100.3 fl — ABNORMAL HIGH (ref 78.0–100.0)
Monocytes Absolute: 0.1 K/uL (ref 0.1–1.0)
Monocytes Relative: 5.8 % (ref 3.0–12.0)
Neutro Abs: 1.1 K/uL — ABNORMAL LOW (ref 1.4–7.7)
Neutrophils Relative %: 50.8 % (ref 43.0–77.0)
Platelets: 178 K/uL (ref 150.0–400.0)
RBC: 3.94 Mil/uL — ABNORMAL LOW (ref 4.22–5.81)
RDW: 14.9 % (ref 11.5–15.5)
WBC: 2.2 K/uL — ABNORMAL LOW (ref 4.0–10.5)

## 2023-11-05 LAB — LIPID PANEL
Cholesterol: 98 mg/dL (ref 0–200)
HDL: 28.6 mg/dL — ABNORMAL LOW (ref >39.00)
LDL Cholesterol: 55 mg/dL (ref 0–99)
NonHDL: 68.98
Total CHOL/HDL Ratio: 3
Triglycerides: 68 mg/dL (ref 0.0–149.0)
VLDL: 13.6 mg/dL (ref 0.0–40.0)

## 2023-11-05 LAB — COMPREHENSIVE METABOLIC PANEL WITH GFR
ALT: 17 U/L (ref 0–53)
AST: 21 U/L (ref 0–37)
Albumin: 4.3 g/dL (ref 3.5–5.2)
Alkaline Phosphatase: 72 U/L (ref 39–117)
BUN: 14 mg/dL (ref 6–23)
CO2: 32 meq/L (ref 19–32)
Calcium: 8.7 mg/dL (ref 8.4–10.5)
Chloride: 99 meq/L (ref 96–112)
Creatinine, Ser: 1.22 mg/dL (ref 0.40–1.50)
GFR: 54.38 mL/min — ABNORMAL LOW (ref >60.00)
Glucose, Bld: 102 mg/dL — ABNORMAL HIGH (ref 70–99)
Potassium: 4.3 meq/L (ref 3.5–5.1)
Sodium: 137 meq/L (ref 135–145)
Total Bilirubin: 0.9 mg/dL (ref 0.2–1.2)
Total Protein: 7.2 g/dL (ref 6.0–8.3)

## 2023-11-05 LAB — TSH: TSH: 4.02 u[IU]/mL (ref 0.35–5.50)

## 2023-11-05 LAB — PSA: PSA: 0.61 ng/mL (ref 0.10–4.00)

## 2023-11-05 LAB — HEMOGLOBIN A1C: Hgb A1c MFr Bld: 6.4 % (ref 4.6–6.5)

## 2023-11-05 MED ORDER — TADALAFIL 20 MG PO TABS: 10.0000 mg | ORAL_TABLET | ORAL | 11 refills | Status: AC | PRN

## 2023-11-05 MED ORDER — LOVASTATIN 20 MG PO TABS: 20.0000 mg | ORAL_TABLET | Freq: Every day | ORAL | 3 refills | Status: AC

## 2023-11-05 MED ORDER — NIFEDIPINE ER OSMOTIC RELEASE 60 MG PO TB24: 60.0000 mg | ORAL_TABLET | Freq: Every day | ORAL | 3 refills | Status: AC

## 2023-11-05 MED ORDER — ENALAPRIL MALEATE 10 MG PO TABS: 10.0000 mg | ORAL_TABLET | Freq: Every day | ORAL | 3 refills | Status: AC

## 2023-11-05 MED ORDER — FREESTYLE PRECISION NEO TEST VI STRP: ORAL_STRIP | 4 refills | Status: AC

## 2023-11-05 MED ORDER — LABETALOL HCL 100 MG PO TABS: 100.0000 mg | ORAL_TABLET | Freq: Two times a day (BID) | ORAL | 3 refills | Status: AC

## 2023-11-05 MED ORDER — FUROSEMIDE 20 MG PO TABS: 20.0000 mg | ORAL_TABLET | Freq: Every day | ORAL | 3 refills | Status: AC

## 2023-11-05 MED ORDER — LEVOTHYROXINE SODIUM 75 MCG PO TABS: 75.0000 ug | ORAL_TABLET | Freq: Every day | ORAL | 3 refills | Status: DC

## 2023-11-05 NOTE — Patient Instructions (Signed)

## 2023-11-05 NOTE — Progress Notes (Signed)
 Subjective:    Patient ID: Trevor Cooke, male    DOB: 1940/09/05, 83 y.o.   MRN: [id number]  Chief Complaint  Patient presents with   Hyperlipidemia   Hypothyroidism   Diabetes   Hypertension    HPI Patient is in today for f/u chol , bp , dm and thyroid .   Discussed the use of AI scribe software for clinical note transcription with the patient, who gave verbal consent to proceed.  History of Present Illness Trevor Cooke is an 83 year old male who presents for a routine follow-up visit.  He feels generally well and has been busy taking care of his grandchildren over the summer. He has gained approximately ten pounds, which he attributes to eating out at his grandchildren's favorite places.  He expresses concern about his kidney function, although he does not specify any symptoms related to this.  He underwent cataract surgery last fall and is due for a follow-up in October.  He is currently taking several medications including enalapril , furosemide , labetalol , levothyroxine , lovastatin , and Cialis . Cialis  initially caused headaches, but he has not experienced this side effect recently. He takes Cialis  every other day or as needed.  His blood sugar levels are usually around 100 to 105, with a previous reading of 121.    Past Medical History:  Diagnosis Date   Arthritis    in neck   CVA (cerebral infarction) 1996   GERD (gastroesophageal reflux disease)    History of prediabetes    Hyperlipidemia    Hypertension    MDS (myelodysplastic syndrome), low grade (HCC) 04/26/2016   Neutropenia (HCC)    Psoriasis    Thyroid  disease     Past Surgical History:  Procedure Laterality Date   CATARACT EXTRACTION Left 10/26/2022   [facility]   CLEFT PALATE REPAIR  1940's   left     Family History  Problem Relation Age of Onset   Hypertension Mother    Hypertension Father    Stroke Father        died from it    Diabetes Sister    Pancreatic cancer Sister     Cancer Maternal Uncle        all 4 had cancer. One was brain cancer   Diabetes Maternal Uncle    Colon cancer Neg Hx    Thyroid  disease Neg Hx     Social History   Socioeconomic History   Marital status: Married    Spouse name: Not on file   Number of children: 3   Years of education: Not on file   Highest education level: Master's degree (e.g., MA, MS, MEng, MEd, MSW, MBA)  Occupational History   Not on file  Tobacco Use   Smoking status: Former   Smokeless tobacco: Never   Tobacco comments:    quit 1970  Vaping Use   Vaping status: Never Used  Substance and Sexual Activity   Alcohol use: Yes    Alcohol/week: 0.0 standard drinks of alcohol    Comment: occasional wine   Drug use: No   Sexual activity: Never  Other Topics Concern   Not on file  Social History Narrative   Not on file   Social Drivers of Health   Financial Resource Strain: Low Risk  (10/30/2023)   Overall Financial Resource Strain (CARDIA)    Difficulty of Paying Living Expenses: Not hard at all  Food Insecurity: No Food Insecurity (10/30/2023)   Hunger Vital Sign    Worried  About Running Out of Food in the Last Year: Never true    Ran Out of Food in the Last Year: Never true  Transportation Needs: No Transportation Needs (10/30/2023)   PRAPARE - Administrator, Civil Service (Medical): No    Lack of Transportation (Non-Medical): No  Physical Activity: Unknown (10/30/2023)   Exercise Vital Sign    Days of Exercise per Week: 3 days    Minutes of Exercise per Session: Not on file  Stress: No Stress Concern Present (10/30/2023)   [employer] of Occupational Health - Occupational Stress Questionnaire    Feeling of Stress: Not at all  Social Connections: Socially Integrated (10/30/2023)   Social Connection and Isolation Panel    Frequency of Communication with Friends and Family: Three times a week    Frequency of Social Gatherings with Friends and Family: Once a week    Attends Religious  Services: More than 4 times per year    Active Member of [employer] or Organizations: Yes    Attends Engineer, structural: More than 4 times per year    Marital Status: Married  Catering manager Violence: Not on file    Outpatient Medications Prior to Visit  Medication Sig Dispense Refill   B Complex-Biotin-FA (B-50 COMPLEX PO) Take 1 tablet by mouth daily.     Calcipotriene  0.005 % solution Apply to aa's psoriasis on the scalp QD-BID PRN. 60 mL 11   Calcitriol 3 MCG/GM cream Apply topically.     Calcium Carb-Cholecalciferol (CALCIUM 500+D) 500-10 MG-MCG TABS Take 1 tablet by mouth daily.     Calcium Carbonate POWD Take by mouth.     Cholecalciferol (VITAMIN D ) 2000 units CAPS Take 2,000 Units by mouth daily.     desonide  (DESOWEN ) 0.05 % cream Apply topically as directed. Qam 5 days a week to aa psoriasis on body until clear, then prn flares 60 g 6   Emollient (EUCERIN) lotion Apply topically as needed for dry skin.     mometasone  (ELOCON ) 0.1 % lotion Apply topically as directed. Apply to itchy scalp 3-5 nights a week prn itch for psoriasis 60 mL 6   Multiple Vitamins-Minerals (MULTIVITAMIN WITH MINERALS) tablet Take 1 tablet by mouth daily. CitraVit     omeprazole (PRILOSEC) 20 MG capsule Take 1 capsule by mouth daily.     Testosterone  Enanthate (XYOSTED) 75 MG/0.5ML SOAJ Inject into the skin.     Zinc 50 MG CAPS Take 1 capsule by mouth daily.     enalapril  (VASOTEC ) 10 MG tablet Take 1 tablet (10 mg total) by mouth daily. 90 tablet 3   furosemide  (LASIX ) 20 MG tablet Take 1 tablet (20 mg total) by mouth daily. 90 tablet 1   glucose blood (FREESTYLE PRECISION NEO TEST) test strip Check glucose 1 time daily dx:E11.9 100 each 4   labetalol  (NORMODYNE ) 100 MG tablet Take 1 tablet (100 mg total) by mouth 2 (two) times daily. 180 tablet 3   levothyroxine  (SYNTHROID ) 75 MCG tablet Take 1 tablet (75 mcg total) by mouth daily. 90 tablet 3   lovastatin  (MEVACOR ) 20 MG tablet Take 1 tablet  (20 mg total) by mouth at bedtime. 90 tablet 3   NIFEdipine  (PROCARDIA  XL/NIFEDICAL XL) 60 MG 24 hr tablet Take 1 tablet (60 mg total) by mouth daily. 90 tablet 3   tadalafil  (CIALIS ) 20 MG tablet Take 0.5-1 tablets (10-20 mg total) by mouth every other day as needed for erectile dysfunction. 10 tablet 11   calcipotriene -betamethasone (  TACLONEX SCALP) external suspension Apply topically daily. For psoriasis at scalp (Patient not taking: Reported on 11/05/2023) 60 g 11   No facility-administered medications prior to visit.    No Known Allergies  Review of Systems  Constitutional:  Negative for fever and malaise/fatigue.  HENT:  Negative for congestion.   Eyes:  Negative for blurred vision.  Respiratory:  Negative for cough and shortness of breath.   Cardiovascular:  Negative for chest pain, palpitations and leg swelling.  Gastrointestinal:  Negative for abdominal pain, blood in stool, nausea and vomiting.  Genitourinary:  Negative for dysuria and frequency.  Musculoskeletal:  Negative for back pain and falls.  Skin:  Negative for rash.  Neurological:  Negative for dizziness, loss of consciousness and headaches.  Endo/Heme/Allergies:  Negative for environmental allergies.  Psychiatric/Behavioral:  Negative for depression. The patient is not nervous/anxious.        Objective:    Physical Exam Vitals and nursing note reviewed.  Constitutional:      General: He is not in acute distress.    Appearance: Normal appearance. He is well-developed.  HENT:     Head: Normocephalic and atraumatic.  Eyes:     General: No scleral icterus.       Right eye: No discharge.        Left eye: No discharge.  Cardiovascular:     Rate and Rhythm: Normal rate and regular rhythm.     Heart sounds: No murmur heard. Pulmonary:     Effort: Pulmonary effort is normal. No respiratory distress.     Breath sounds: Normal breath sounds.  Musculoskeletal:        General: Normal range of motion.     Cervical  back: Normal range of motion and neck supple.     Right lower leg: No edema.     Left lower leg: No edema.  Skin:    General: Skin is warm and dry.  Neurological:     Mental Status: He is alert and oriented to person, place, and time.  Psychiatric:        Mood and Affect: Mood normal.        Behavior: Behavior normal.        Thought Content: Thought content normal.        Judgment: Judgment normal.     BP 132/80 (BP Location: Left Arm, Patient Position: Sitting, Cuff Size: Large)   Pulse (!) 52   Temp 97.7 F (36.5 C) (Oral)   Resp 18   Ht 5' 8 (1.727 m)   Wt 192 lb (87.1 kg)   SpO2 95%   BMI 29.19 kg/m  Wt Readings from Last 3 Encounters:  11/05/23 192 lb (87.1 kg)  08/06/23 193 lb (87.5 kg)  05/08/23 189 lb 12.8 oz (86.1 kg)    Diabetic Foot Exam - Simple   No data filed    Lab Results  Component Value Date   WBC 2.7 (L) 08/06/2023   HGB 14.3 08/06/2023   HCT 41.7 08/06/2023   PLT 143 (L) 08/06/2023   GLUCOSE 140 (H) 08/06/2023   CHOL 100 05/08/2023   TRIG 57.0 05/08/2023   HDL 36.80 (L) 05/08/2023   LDLCALC 52 05/08/2023   ALT 19 08/06/2023   AST 23 08/06/2023   NA 138 08/06/2023   K 4.1 08/06/2023   CL 100 08/06/2023   CREATININE 1.29 (H) 08/06/2023   BUN 14 08/06/2023   CO2 32 08/06/2023   TSH 3.83 05/08/2023   PSA 0.40 03/09/2020  HGBA1C 6.0 05/08/2023   MICROALBUR 0.7 07/30/2023    Lab Results  Component Value Date   TSH 3.83 05/08/2023   Lab Results  Component Value Date   WBC 2.7 (L) 08/06/2023   HGB 14.3 08/06/2023   HCT 41.7 08/06/2023   MCV 99.8 08/06/2023   PLT 143 (L) 08/06/2023   Lab Results  Component Value Date   NA 138 08/06/2023   K 4.1 08/06/2023   CO2 32 08/06/2023   GLUCOSE 140 (H) 08/06/2023   BUN 14 08/06/2023   CREATININE 1.29 (H) 08/06/2023   BILITOT 0.7 08/06/2023   ALKPHOS 72 08/06/2023   AST 23 08/06/2023   ALT 19 08/06/2023   PROT 7.8 08/06/2023   ALBUMIN 4.7 08/06/2023   CALCIUM 9.3 08/06/2023    ANIONGAP 6 08/06/2023   GFR 63.17 05/08/2023   Lab Results  Component Value Date   CHOL 100 05/08/2023   Lab Results  Component Value Date   HDL 36.80 (L) 05/08/2023   Lab Results  Component Value Date   LDLCALC 52 05/08/2023   Lab Results  Component Value Date   TRIG 57.0 05/08/2023   Lab Results  Component Value Date   CHOLHDL 3 05/08/2023   Lab Results  Component Value Date   HGBA1C 6.0 05/08/2023       Assessment & Plan:  Primary hypertension Assessment & Plan: .Well controlled, no changes to meds. Encouraged heart healthy diet such as the DASH diet and exercise as tolerated.     Essential hypertension -     Enalapril  Maleate; Take 1 tablet (10 mg total) by mouth daily.  Dispense: 90 tablet; Refill: 3 -     Furosemide ; Take 1 tablet (20 mg total) by mouth daily.  Dispense: 90 tablet; Refill: 3 -     Labetalol  HCl; Take 1 tablet (100 mg total) by mouth 2 (two) times daily.  Dispense: 180 tablet; Refill: 3 -     NIFEdipine  ER Osmotic Release; Take 1 tablet (60 mg total) by mouth daily.  Dispense: 90 tablet; Refill: 3 -     CBC with Differential/Platelet -     Comprehensive metabolic panel with GFR -     Lipid panel  Diet-controlled diabetes mellitus (HCC) -     FreeStyle Precision Neo Test; Check glucose 1 time daily dx:E11.9  Dispense: 100 each; Refill: 4 -     Comprehensive metabolic panel with GFR -     Hemoglobin A1c  Hypothyroidism, unspecified type Assessment & Plan: Con't synthroid   Orders: -     Levothyroxine  Sodium; Take 1 tablet (75 mcg total) by mouth daily.  Dispense: 90 tablet; Refill: 3 -     TSH  Hyperlipidemia, unspecified hyperlipidemia type Assessment & Plan: Encourage heart healthy diet such as MIND or DASH diet, increase exercise, avoid trans fats, simple carbohydrates and processed foods, consider a krill or fish or flaxseed oil cap daily.    Orders: -     Lovastatin ; Take 1 tablet (20 mg total) by mouth at bedtime.  Dispense: 90  tablet; Refill: 3 -     Comprehensive metabolic panel with GFR -     Lipid panel  Erectile dysfunction, unspecified erectile dysfunction type -     Tadalafil ; Take 0.5-1 tablets (10-20 mg total) by mouth every other day as needed for erectile dysfunction.  Dispense: 10 tablet; Refill: 11 -     PSA  Frequency of micturition -     PSA   Assessment and Plan Assessment &  Plan Chronic kidney disease   Chronic kidney disease is a concern. He expressed worry about his kidney function. Check kidney function today.  Type 2 diabetes mellitus   Blood sugar levels last recorded at 121 mg/dL, usually around 899-894 mg/dL.      3 Trevor JONELLE Antonio Cyndee, DO

## 2023-11-05 NOTE — Assessment & Plan Note (Signed)
 Well controlled, no changes to meds. Encouraged heart healthy diet such as the DASH diet and exercise as tolerated.

## 2023-11-05 NOTE — Assessment & Plan Note (Signed)
 Cont synthroid

## 2023-11-05 NOTE — Assessment & Plan Note (Signed)
 Encourage heart healthy diet such as MIND or DASH diet, increase exercise, avoid trans fats, simple carbohydrates and processed foods, consider a krill or fish or flaxseed oil cap daily.

## 2023-11-06 ENCOUNTER — Other Ambulatory Visit: Payer: PRIVATE HEALTH INSURANCE

## 2023-11-06 DIAGNOSIS — Z006 Encounter for examination for normal comparison and control in clinical research program: Secondary | ICD-10-CM

## 2023-11-16 LAB — GENECONNECT MOLECULAR SCREEN: Genetic Analysis Overall Interpretation: NEGATIVE

## 2023-12-13 ENCOUNTER — Telehealth: Payer: Self-pay | Admitting: Family Medicine

## 2023-12-13 NOTE — Telephone Encounter (Signed)
 Copied from CRM #[id number]. Topic: Medicare AWV >> Dec 13, 2023 10:44 AM Nathanel DEL wrote: Reason for CRM: Called LVM 12/13/2023 to schedule AWV. Please schedule Virtual or Telehealth visits ONLY.   Nathanel Paschal; Care Guide Ambulatory Clinical Support [facility] Direct Dial: [phone]

## 2023-12-14 NOTE — Progress Notes (Signed)
 [name] came for a follow up. He is 83 years old. His wife is 76 y old. They live in [place]. I have seen him on 07/16/23 which the evaluation and plan were:   He is doing well on TRT injections.  His last injection was on Thursday.  Occasionally he takes Cialis  for sexual activity which is working.  We reviewed and discussed his previous lab results.  After a detailed conversation he agreed to the following plan.   1) CBC, PSA 2) Continue PRN Cialis  (10 mg) under PCP 3) Continue SQ TRT via [facility] every Thursday 4) RPV in 5 months on a Monday   Latest Reference Range & Units 07/16/23 08:35  Prostate Specific Antigen ng/mL 0.59  WBC 4.40 - 11.00 10*3/uL 2.50 (L)  RBC 4.50 - 5.90 10*6/uL 4.01 (L)  Hemoglobin 14.0 - 17.5 g/dL 86.0 (L)  Hematocrit 58.4 - 50.4 % 40.5 (L)  Mean Corpuscular Volume (MCV) 80.0 - 96.0 fL 101.0 (H)  MCH 27.5 - 33.2 pg 34.7 (H)  Mean Corpuscular Hemoglobin Conc (MCHC) 33.0 - 37.0 g/dL 65.5  Red Cell Distribution Width (RDW-CV) 12.3 - 17.0 % 14.8  Platelet Count (Plt) 150 - 450 10*3/uL 166  Mean Platelet Volume (MPV) 6.8 - 10.2 fL 8.8  (L): Data is abnormally low (H): Data is abnormally high    Testosterone , Total  Date Value Ref Range Status  08/07/2022 462 150 - 684 ng/dL Final   Estradiol   Date Value Ref Range Status  02/12/2023 51 (H) <40 pg/mL Final   Hemoglobin  Date Value Ref Range Status  07/16/2023 13.9 (L) 14.0 - 17.5 g/dL Final   Hematocrit  Date Value Ref Range Status  07/16/2023 40.5 (L) 41.5 - 50.4 % Final   PSA, Total  Date Value Ref Range Status  07/16/2023 0.59 ng/mL Final    Comment:    Results are obtained using a [employer] Coulter's chemiluminescent immunoassay.  Patient results determined by assays using a different manufacturer and/or method may not be comparable.      Latest Reference Range & Units 11/24/19 13:39 01/05/20 13:47 01/10/21 11:17 08/07/22 13:38  Testosterone , Total 150 - 684 ng/dL    537  HX FREE  TESTOSTERONE , MAILOUT 2.88 - 10.5 ng/dL 9.72 (L) 89.3 (H)    HX TESTOSTERONE  150 - 684 NG/DL 34 (L) 728 596   HX TESTOSTERONE  TOTAL, MAILOUT 240 - 950 ng/dL 17 (L) 697    (L): Data is abnormally low (H): Data is abnormally high   Latest Reference Range & Units 11/24/19 13:39 01/05/20 13:47 01/10/21 11:17 08/07/22 13:38 02/12/23 10:28  Estradiol  Level <40 pg/mL    63 (H) 51 (H)  Estradiol , Enhanced <39.8 PG/ML <15.0 (L) 30.1 49.4 (H)    (H): Data is abnormally high (L): Data is abnormally low   Latest Reference Range & Units 11/24/19 13:39 01/05/20 13:47  Luteinizing Hormone (LH) 0.9 - 10.6 MIU/ML 17.3 (H) 4.9  (H): Data is abnormally high   Latest Reference Range & Units 11/24/19 13:39 01/05/20 13:47 07/05/20 11:45 01/10/21 11:17 01/23/22 09:56 02/12/23 10:28 07/16/23 08:35  WBC 4.40 - 11.00 10*3/uL 2.3 (L) 2.3 (L) 2.3 (L) 2.1 (L) 1.9 (L) 2.60 (L) 2.50 (L)  RBC 4.50 - 5.90 10*6/uL 3.41 (L) 3.61 (L) 4.21 (L) 4.19 (L) 3.88 (L) 4.16 (L) 4.01 (L)  Hemoglobin 14.0 - 17.5 g/dL 88.7 (L) 87.7 (L) 85.8 14.4 13.3 (L) 14.1 13.9 (L)  Hematocrit 41.5 - 50.4 % 32.9 (L) 34.9 (L) 40.6 (L) 40.8 (  L) 38.1 (L) 41.1 (L) 40.5 (L)  (L): Data is abnormally low   Latest Reference Range & Units 11/24/19 13:39 07/05/20 11:45 07/11/21 09:51 08/07/22 13:38 07/16/23 08:35  Prostate Specific Antigen ng/mL    0.44 0.59  PROSTATE SPECIFIC AG, SERUM 0.00 - 6.50 NG/ML 0.04 0.4 0.43        Chief Complaints which were addressed today: Low libido, fatigue and erectile dysfunction   Updated Evaluation (Low libido, fatigue and ED due to hypogonadism and HTN. Un-explained bilateral small testes, new DEXA showed Osteopenia but improvement on bone density, Initial T17/LH 17.3,  wife 35 old): History of Present Illness The patient is an 83 year old male who presents for low libido, fatigue, and erectile dysfunction.  He reports a general sense of well-being and believes his current medication regimen is effective. He  received his last testosterone  injection on the previous Thursday, which he feels has been beneficial. He also notes that the testosterone  treatment has improved his energy levels. He is on thyroid  medication.  He does not frequently use Cialis  (tadalafil ) as he does not experience any stiffness. He mentions that his wife would like to engage in sexual activity occasionally, but the Cialis  does not seem to be effective. He has not tried Viagra before and is open to trying it to see if it provides better results. He recalls that when he first started taking Cialis , it caused him headaches. He also reports that his urinary function is normal, although slightly slower than before.  His last testosterone  level was checked over a year ago and was 462. His PSA was checked in 06/2022 and was 0.5.   Assessment & Plan 1. Erectile dysfunction. Blood pressure is well-regulated at 123/72. The last testosterone  level checked over a year ago was 462, and the most recent PSA level from 06/2022 was 0.5, indicating no concerns. A prescription for sildenafil 50 mg has been provided, with instructions to take it 30 minutes prior to sexual activity. If this does not yield satisfactory results, the dosage may be increased to 100 mg, which is the maximum recommended dose. However, if headaches occur at this dosage, it is advised to revert to the 50 mg dose. Combining 50 mg of sildenafil with 20 mg of tadalafil  can be tried if needed. A prescription for tadalafil  20 mg has also been provided. The current Cialis  regimen should be continued. A refill for testosterone  sufficient for the next 6 months has been sent to the pharmacy. Both prescriptions for sildenafil and tadalafil  have been sent to the pharmacy.  2. Low libido. The patient reports that the testosterone  injections are helping with his energy levels. The last testosterone  level checked over a year ago was 462. A refill for testosterone  sufficient for the next 6 months  has been sent to the pharmacy.  3. Fatigue. The patient reports that the testosterone  injections are helping with his energy levels. The last testosterone  level checked over a year ago was 462. A refill for testosterone  sufficient for the next 6 months has been sent to the pharmacy.     Updated Plan: 1) T, E2, 17HP, PSA, CBC 2) Try Sildenafil alone and in combination with Tadalafil  toi [facility] 3) Continue SQ TRT via [facility] every Thursday 4) RPV in 3 months on a Monday   I have personally spent 45 minutes involved in face-to-face and non-face-to-face activities for this patient on the day of the visit.

## 2023-12-17 DIAGNOSIS — E291 Testicular hypofunction: Secondary | ICD-10-CM | POA: Diagnosis not present

## 2023-12-17 DIAGNOSIS — N5203 Combined arterial insufficiency and corporo-venous occlusive erectile dysfunction: Secondary | ICD-10-CM | POA: Diagnosis not present

## 2023-12-19 ENCOUNTER — Ambulatory Visit: Payer: Self-pay

## 2023-12-19 NOTE — Telephone Encounter (Signed)
 FYI Only or Action Required?: FYI only for provider. Scheduled to see PCP on 12/24/2023 at 9:40 AM  Patient was last seen in primary care on 11/05/2023 by Antonio Meth, [name], DO.  Called Nurse Triage reporting Facial Swelling (Swelling to the nose).  Symptoms began about a month ago.  Interventions attempted: Rest, hydration, or home remedies.  Symptoms are: unchanged.  Triage Disposition: See PCP Within 2 Weeks  Patient/caregiver understands and will follow disposition?: Yes  Copied from CRM [id number]. Topic: Clinical - Red Word Triage >> Dec 19, 2023  9:47 AM Trevor Cooke wrote: Red Word that prompted transfer to Nurse Triage: Patient stated his nose is swelling Reason for Disposition  [1] Face swelling is a chronic problem (recurrent or ongoing AND present > 4 weeks) AND [2] cause unknown  Answer Assessment - Initial Assessment Questions 1. ONSET: When did the swelling start? (e.g., minutes, hours, days)     Started a month ago 2. LOCATION: What part of the face is swollen? (e.g., cheek, entire face, jaw joint area, under jaw)     nose 3. SEVERITY: How swollen is it?     Localized to nose 4. ITCHING: Is there any itching? If Yes, ask: How much?   (Scale 1-10; mild, moderate or severe)     no 5. PAIN: Is the swelling painful to touch? If Yes, ask: How painful is it?   (Scale 0-10; mild, moderate or severe)     mild 6. FEVER: Do you have a fever? If Yes, ask: What is it, how was it measured, and when did it start?      no 7. CAUSE: What do you think is causing the face swelling?     unsure 8. NEW MEDICINES: Have there been any new medicines started recently?     no 9. RECURRENT SYMPTOM: Have you had face swelling before? If Yes, ask: When was the last time? What happened that time?     no 10. OTHER SYMPTOMS: Do you have any other symptoms? (e.g., leg swelling, toothache)       no  Protocols used: Face Swelling-A-AH

## 2023-12-24 ENCOUNTER — Ambulatory Visit: Payer: Self-pay | Admitting: Family Medicine

## 2023-12-24 ENCOUNTER — Ambulatory Visit: Admitting: Family Medicine

## 2023-12-24 ENCOUNTER — Encounter: Payer: Self-pay | Admitting: Family Medicine

## 2023-12-24 ENCOUNTER — Telehealth: Payer: Self-pay

## 2023-12-24 VITALS — BP 136/82 | HR 52 | Temp 97.8°F | Resp 18 | Ht 68.0 in | Wt 191.0 lb

## 2023-12-24 DIAGNOSIS — K219 Gastro-esophageal reflux disease without esophagitis: Secondary | ICD-10-CM

## 2023-12-24 DIAGNOSIS — I10 Essential (primary) hypertension: Secondary | ICD-10-CM

## 2023-12-24 DIAGNOSIS — E119 Type 2 diabetes mellitus without complications: Secondary | ICD-10-CM | POA: Diagnosis not present

## 2023-12-24 DIAGNOSIS — E039 Hypothyroidism, unspecified: Secondary | ICD-10-CM | POA: Diagnosis not present

## 2023-12-24 DIAGNOSIS — J3489 Other specified disorders of nose and nasal sinuses: Secondary | ICD-10-CM | POA: Diagnosis not present

## 2023-12-24 DIAGNOSIS — E785 Hyperlipidemia, unspecified: Secondary | ICD-10-CM | POA: Diagnosis not present

## 2023-12-24 LAB — COMPREHENSIVE METABOLIC PANEL WITH GFR
ALT: 20 U/L (ref 0–53)
AST: 24 U/L (ref 0–37)
Albumin: 4.3 g/dL (ref 3.5–5.2)
Alkaline Phosphatase: 64 U/L (ref 39–117)
BUN: 14 mg/dL (ref 6–23)
CO2: 31 meq/L (ref 19–32)
Calcium: 9 mg/dL (ref 8.4–10.5)
Chloride: 101 meq/L (ref 96–112)
Creatinine, Ser: 1.18 mg/dL (ref 0.40–1.50)
GFR: 56.55 mL/min — ABNORMAL LOW (ref >60.00)
Glucose, Bld: 103 mg/dL — ABNORMAL HIGH (ref 70–99)
Potassium: 4.6 meq/L (ref 3.5–5.1)
Sodium: 136 meq/L (ref 135–145)
Total Bilirubin: 0.8 mg/dL (ref 0.2–1.2)
Total Protein: 7.1 g/dL (ref 6.0–8.3)

## 2023-12-24 LAB — LIPID PANEL
Cholesterol: 84 mg/dL (ref 0–200)
HDL: 25.3 mg/dL — ABNORMAL LOW (ref >39.00)
LDL Cholesterol: 50 mg/dL (ref 0–99)
NonHDL: 58.23
Total CHOL/HDL Ratio: 3
Triglycerides: 39 mg/dL (ref 0.0–149.0)
VLDL: 7.8 mg/dL (ref 0.0–40.0)

## 2023-12-24 LAB — CBC WITH DIFFERENTIAL/PLATELET
Basophils Absolute: 0 K/uL (ref 0.0–0.1)
Basophils Relative: 0.4 % (ref 0.0–3.0)
Eosinophils Absolute: 0 K/uL (ref 0.0–0.7)
Eosinophils Relative: 2.3 % (ref 0.0–5.0)
HCT: 38.9 % — ABNORMAL LOW (ref 39.0–52.0)
Hemoglobin: 13.3 g/dL (ref 13.0–17.0)
Lymphocytes Relative: 43.3 % (ref 12.0–46.0)
Lymphs Abs: 0.9 K/uL (ref 0.7–4.0)
MCHC: 34.2 g/dL (ref 30.0–36.0)
MCV: 101.9 fl — ABNORMAL HIGH (ref 78.0–100.0)
Monocytes Absolute: 0.1 K/uL (ref 0.1–1.0)
Monocytes Relative: 6.1 % (ref 3.0–12.0)
Neutro Abs: 1 K/uL — ABNORMAL LOW (ref 1.4–7.7)
Neutrophils Relative %: 47.9 % (ref 43.0–77.0)
Platelets: 179 K/uL (ref 150.0–400.0)
RBC: 3.82 Mil/uL — ABNORMAL LOW (ref 4.22–5.81)
RDW: 14.4 % (ref 11.5–15.5)
WBC: 2 K/uL — CL (ref 4.0–10.5)

## 2023-12-24 LAB — MICROALBUMIN / CREATININE URINE RATIO
Creatinine,U: 51.5 mg/dL
Microalb Creat Ratio: 14.3 mg/g (ref 0.0–30.0)
Microalb, Ur: 0.7 mg/dL (ref 0.0–1.9)

## 2023-12-24 LAB — HEMOGLOBIN A1C: Hgb A1c MFr Bld: 6.5 % (ref 4.6–6.5)

## 2023-12-24 MED ORDER — MUPIROCIN 2 % EX OINT: 1.0000 | TOPICAL_OINTMENT | Freq: Two times a day (BID) | CUTANEOUS | 0 refills | Status: AC

## 2023-12-24 MED ORDER — OMEPRAZOLE 20 MG PO CPDR: 20.0000 mg | DELAYED_RELEASE_CAPSULE | Freq: Two times a day (BID) | ORAL | 3 refills | Status: AC

## 2023-12-24 NOTE — Progress Notes (Signed)
 Subjective:    Patient ID: Trevor Cooke, male    DOB: 1940/09/14, 83 y.o.   MRN: [id number]  Chief Complaint  Patient presents with   Facial Swelling    Pt states having nasal swelling and sores. Pt states going on for longer than a year     HPI Patient is in today for nasal swelling.   Discussed the use of AI scribe software for clinical note transcription with the patient, who gave verbal consent to proceed.  History of Present Illness Trevor Cooke is an 83 year old male who presents with nasal sores and a persistent cough.  He has sores inside his nose that bleed regularly, especially when using a Q-tip. Last week, the sores were infected, causing redness and swelling. He has been applying white Vaseline to the area but has not used any other treatments. He reports concern about the possibility of a serious condition because a family member had a similar issue that was cancerous.  He has a persistent dry cough that has been ongoing since last year, worsening at night and when lying down. The cough is loud and originates from deep in his chest, but it does not cause pain or result in coughing up phlegm. He recalls having a severe cold last Christmas and has experienced multiple illnesses over the past year. No sore throat or sour taste in his mouth, but he mentions a sour breath smell. He currently takes Prilosec (omeprazole) 20 mg in the morning.  He mentions a history of low blood counts, with stable low neutrophils for the past fifteen years. His hemoglobin was recently 13.4, and his platelet count was 169. He is concerned about the potential impact of his nasal bleeding on his blood counts.  He recently saw a doctor who noted high estrogen levels and prescribed anastrozole. He is unsure about the implications of this treatment, as he associates the medication with breast growth in women.   Past Medical History:  Diagnosis Date   Arthritis    in neck   CVA (cerebral  infarction) 1996   GERD (gastroesophageal reflux disease)    History of prediabetes    Hyperlipidemia    Hypertension    MDS (myelodysplastic syndrome), low grade (HCC) 04/26/2016   Neutropenia    Psoriasis    Thyroid  disease     Past Surgical History:  Procedure Laterality Date   CATARACT EXTRACTION Left 10/26/2022   [facility]   CLEFT PALATE REPAIR  1940's   left     Family History  Problem Relation Age of Onset   Hypertension Mother    Hypertension Father    Stroke Father        died from it    Diabetes Sister    Pancreatic cancer Sister    Cancer Maternal Uncle        all 4 had cancer. One was brain cancer   Diabetes Maternal Uncle    Colon cancer Neg Hx    Thyroid  disease Neg Hx     Social History   Socioeconomic History   Marital status: Married    Spouse name: Not on file   Number of children: 3   Years of education: Not on file   Highest education level: Master's degree (e.g., MA, MS, MEng, MEd, MSW, MBA)  Occupational History   Not on file  Tobacco Use   Smoking status: Former   Smokeless tobacco: Never   Tobacco comments:    quit 1970  Vaping Use   Vaping status: Never Used  Substance and Sexual Activity   Alcohol use: Yes    Alcohol/week: 0.0 standard drinks of alcohol    Comment: occasional wine   Drug use: No   Sexual activity: Never  Other Topics Concern   Not on file  Social History Narrative   Not on file   Social Drivers of Health   Financial Resource Strain: Low Risk  (10/30/2023)   Overall Financial Resource Strain (CARDIA)    Difficulty of Paying Living Expenses: Not hard at all  Food Insecurity: No Food Insecurity (10/30/2023)   Hunger Vital Sign    Worried About Running Out of Food in the Last Year: Never true    Ran Out of Food in the Last Year: Never true  Transportation Needs: No Transportation Needs (10/30/2023)   PRAPARE - Administrator, Civil Service (Medical): No    Lack of Transportation (Non-Medical): No   Physical Activity: Unknown (10/30/2023)   Exercise Vital Sign    Days of Exercise per Week: 3 days    Minutes of Exercise per Session: Not on file  Stress: No Stress Concern Present (10/30/2023)   [employer] of Occupational Health - Occupational Stress Questionnaire    Feeling of Stress: Not at all  Social Connections: Socially Integrated (10/30/2023)   Social Connection and Isolation Panel    Frequency of Communication with Friends and Family: Three times a week    Frequency of Social Gatherings with Friends and Family: Once a week    Attends Religious Services: More than 4 times per year    Active Member of [employer] or Organizations: Yes    Attends Engineer, structural: More than 4 times per year    Marital Status: Married  Catering manager Violence: Not on file    Outpatient Medications Prior to Visit  Medication Sig Dispense Refill   B Complex-Biotin-FA (B-50 COMPLEX PO) Take 1 tablet by mouth daily.     Calcipotriene  0.005 % solution Apply to aa's psoriasis on the scalp QD-BID PRN. 60 mL 11   Calcitriol 3 MCG/GM cream Apply topically.     Calcium Carb-Cholecalciferol (CALCIUM 500+D) 500-10 MG-MCG TABS Take 1 tablet by mouth daily.     Calcium Carbonate POWD Take by mouth.     Cholecalciferol (VITAMIN D ) 2000 units CAPS Take 2,000 Units by mouth daily.     desonide  (DESOWEN ) 0.05 % cream Apply topically as directed. Qam 5 days a week to aa psoriasis on body until clear, then prn flares 60 g 6   Emollient (EUCERIN) lotion Apply topically as needed for dry skin.     enalapril  (VASOTEC ) 10 MG tablet Take 1 tablet (10 mg total) by mouth daily. 90 tablet 3   furosemide  (LASIX ) 20 MG tablet Take 1 tablet (20 mg total) by mouth daily. 90 tablet 3   glucose blood (FREESTYLE PRECISION NEO TEST) test strip Check glucose 1 time daily dx:E11.9 100 each 4   labetalol  (NORMODYNE ) 100 MG tablet Take 1 tablet (100 mg total) by mouth 2 (two) times daily. 180 tablet 3   levothyroxine   (SYNTHROID ) 75 MCG tablet Take 1 tablet (75 mcg total) by mouth daily. 90 tablet 3   lovastatin  (MEVACOR ) 20 MG tablet Take 1 tablet (20 mg total) by mouth at bedtime. 90 tablet 3   mometasone  (ELOCON ) 0.1 % lotion Apply topically as directed. Apply to itchy scalp 3-5 nights a week prn itch for psoriasis 60 mL 6  Multiple Vitamins-Minerals (MULTIVITAMIN WITH MINERALS) tablet Take 1 tablet by mouth daily. CitraVit     NIFEdipine  (PROCARDIA  XL/NIFEDICAL XL) 60 MG 24 hr tablet Take 1 tablet (60 mg total) by mouth daily. 90 tablet 3   omeprazole (PRILOSEC) 20 MG capsule Take 1 capsule by mouth daily.     tadalafil  (CIALIS ) 20 MG tablet Take 0.5-1 tablets (10-20 mg total) by mouth every other day as needed for erectile dysfunction. 10 tablet 11   Testosterone  Enanthate (XYOSTED) 75 MG/0.5ML SOAJ Inject into the skin.     Zinc 50 MG CAPS Take 1 capsule by mouth daily.     calcipotriene -betamethasone (TACLONEX SCALP) external suspension Apply topically daily. For psoriasis at scalp (Patient not taking: Reported on 12/24/2023) 60 g 11   No facility-administered medications prior to visit.    No Known Allergies  Review of Systems  Constitutional:  Negative for fever and malaise/fatigue.  HENT:  Negative for congestion.   Eyes:  Negative for blurred vision.  Respiratory:  Positive for cough. Negative for sputum production, shortness of breath and wheezing.   Cardiovascular:  Negative for chest pain, palpitations and leg swelling.  Gastrointestinal:  Negative for vomiting.  Musculoskeletal:  Negative for back pain.  Skin:  Negative for rash.  Neurological:  Negative for loss of consciousness and headaches.       Objective:    Physical Exam Vitals and nursing note reviewed.  Constitutional:      General: He is not in acute distress.    Appearance: Normal appearance. He is well-developed.  HENT:     Head: Normocephalic and atraumatic.     Nose: Nasal tenderness present.     Comments: +  sores and scabs in both nostrils  Eyes:     General: No scleral icterus.       Right eye: No discharge.        Left eye: No discharge.  Cardiovascular:     Rate and Rhythm: Normal rate and regular rhythm.     Heart sounds: No murmur heard. Pulmonary:     Effort: Pulmonary effort is normal. No respiratory distress.     Breath sounds: Normal breath sounds.  Musculoskeletal:        General: Normal range of motion.     Cervical back: Normal range of motion and neck supple.     Right lower leg: No edema.     Left lower leg: No edema.  Skin:    General: Skin is warm and dry.  Neurological:     Mental Status: He is alert and oriented to person, place, and time.  Psychiatric:        Mood and Affect: Mood normal.        Behavior: Behavior normal.        Thought Content: Thought content normal.        Judgment: Judgment normal.     BP 136/82 (BP Location: Right Arm, Patient Position: Sitting, Cuff Size: Normal)   Pulse (!) 52   Temp 97.8 F (36.6 C) (Oral)   Resp 18   Ht 5' 8 (1.727 m)   Wt 191 lb (86.6 kg)   SpO2 97%   BMI 29.04 kg/m  Wt Readings from Last 3 Encounters:  12/24/23 191 lb (86.6 kg)  11/05/23 192 lb (87.1 kg)  08/06/23 193 lb (87.5 kg)    Diabetic Foot Exam - Simple   No data filed    Lab Results  Component Value Date   WBC 2.2 (  L) 11/05/2023   HGB 13.5 11/05/2023   HCT 39.5 11/05/2023   PLT 178.0 11/05/2023   GLUCOSE 102 (H) 11/05/2023   CHOL 98 11/05/2023   TRIG 68.0 11/05/2023   HDL 28.60 (L) 11/05/2023   LDLCALC 55 11/05/2023   ALT 17 11/05/2023   AST 21 11/05/2023   NA 137 11/05/2023   K 4.3 11/05/2023   CL 99 11/05/2023   CREATININE 1.22 11/05/2023   BUN 14 11/05/2023   CO2 32 11/05/2023   TSH 4.02 11/05/2023   PSA 0.61 11/05/2023   HGBA1C 6.4 11/05/2023   MICROALBUR 0.7 07/30/2023    Lab Results  Component Value Date   TSH 4.02 11/05/2023   Lab Results  Component Value Date   WBC 2.2 (L) 11/05/2023   HGB 13.5 11/05/2023    HCT 39.5 11/05/2023   MCV 100.3 (H) 11/05/2023   PLT 178.0 11/05/2023   Lab Results  Component Value Date   NA 137 11/05/2023   K 4.3 11/05/2023   CO2 32 11/05/2023   GLUCOSE 102 (H) 11/05/2023   BUN 14 11/05/2023   CREATININE 1.22 11/05/2023   BILITOT 0.9 11/05/2023   ALKPHOS 72 11/05/2023   AST 21 11/05/2023   ALT 17 11/05/2023   PROT 7.2 11/05/2023   ALBUMIN 4.3 11/05/2023   CALCIUM 8.7 11/05/2023   ANIONGAP 6 08/06/2023   GFR 54.38 (L) 11/05/2023   Lab Results  Component Value Date   CHOL 98 11/05/2023   Lab Results  Component Value Date   HDL 28.60 (L) 11/05/2023   Lab Results  Component Value Date   LDLCALC 55 11/05/2023   Lab Results  Component Value Date   TRIG 68.0 11/05/2023   Lab Results  Component Value Date   CHOLHDL 3 11/05/2023   Lab Results  Component Value Date   HGBA1C 6.4 11/05/2023       Assessment & Plan:  Essential hypertension -     Lipid panel -     CBC with Differential/Platelet -     Comprehensive metabolic panel with GFR  Diet-controlled diabetes mellitus (HCC) -     Microalbumin / creatinine urine ratio -     Hemoglobin A1c  Nasal sore -     Mupirocin; Apply 1 Application topically 2 (two) times daily.  Dispense: 30 g; Refill: 0  Hypothyroidism, unspecified type -     Hemoglobin A1c  Hyperlipidemia, unspecified hyperlipidemia type -     Lipid panel -     Comprehensive metabolic panel with GFR  Gastroesophageal reflux disease, unspecified whether esophagitis present -     Omeprazole; Take 1 capsule (20 mg total) by mouth 2 (two) times daily before a meal.  Dispense: 180 capsule; Refill: 3   Assessment and Plan Assessment & Plan Chronic nasal sores with recurrent bleeding   Chronic nasal sores are crusted and scabbed, bleeding upon manipulation. Differential diagnosis includes infection or irritation, with no immediate signs of malignancy. Prescribe topical ointment to be applied with a Q-tip to the nasal sores twice  daily for one week. Consider referral to an ENT specialist if symptoms do not improve.  Chronic dry cough   Chronic dry cough has persisted since last winter, worsening at night, possibly related to GERD. There is no associated sore throat or productive cough. Acid reflux may contribute, indicated by sour breath and potential acid presence in the throat. Advise taking omeprazole (Prilosec) twice daily, morning and night, to manage potential acid reflux. Send prescription for omeprazole to  the pharmacy. Monitor symptoms and follow up if the cough persists.  Gastroesophageal reflux disease (GERD)   GERD presents with sour breath and potential acid reflux contributing to chronic cough. Current management with over-the-counter omeprazole is suboptimal. Increase omeprazole dosage to twice daily, morning and night. Send prescription for omeprazole to the pharmacy for insurance coverage.  Chronic neutropenia   Chronic neutropenia with a low white blood cell count has persisted over the past 15 years. Hemoglobin is slightly below normal, but platelet count is within normal range, reducing concern for bleeding risk. Continue monitoring blood counts as per current management plan.   Arminda Foglio R Lowne Chase, DO

## 2023-12-24 NOTE — Telephone Encounter (Signed)
 CRITICAL VALUE STICKER  CRITICAL VALUE: WBC -2.0

## 2023-12-27 DIAGNOSIS — Z961 Presence of intraocular lens: Secondary | ICD-10-CM | POA: Diagnosis not present

## 2024-01-29 ENCOUNTER — Other Ambulatory Visit: Payer: Self-pay

## 2024-01-29 DIAGNOSIS — E039 Hypothyroidism, unspecified: Secondary | ICD-10-CM

## 2024-01-29 MED ORDER — LEVOTHYROXINE SODIUM 75 MCG PO TABS: 75.0000 ug | ORAL_TABLET | Freq: Every day | ORAL | 1 refills | Status: AC

## 2024-02-04 ENCOUNTER — Inpatient Hospital Stay (HOSPITAL_BASED_OUTPATIENT_CLINIC_OR_DEPARTMENT_OTHER): Payer: PRIVATE HEALTH INSURANCE | Admitting: Hematology & Oncology

## 2024-02-04 ENCOUNTER — Encounter: Payer: Self-pay | Admitting: Hematology & Oncology

## 2024-02-04 ENCOUNTER — Inpatient Hospital Stay: Payer: PRIVATE HEALTH INSURANCE | Attending: Hematology & Oncology

## 2024-02-04 VITALS — BP 172/69 | HR 51 | Temp 97.5°F | Resp 20 | Ht 66.0 in | Wt 189.1 lb

## 2024-02-04 DIAGNOSIS — D46Z Other myelodysplastic syndromes: Secondary | ICD-10-CM | POA: Diagnosis not present

## 2024-02-04 DIAGNOSIS — D72819 Decreased white blood cell count, unspecified: Secondary | ICD-10-CM | POA: Diagnosis not present

## 2024-02-04 DIAGNOSIS — D4620 Refractory anemia with excess of blasts, unspecified: Secondary | ICD-10-CM | POA: Diagnosis not present

## 2024-02-04 LAB — CMP (CANCER CENTER ONLY)
ALT: 29 U/L (ref 0–44)
AST: 32 U/L (ref 15–41)
Albumin: 4.6 g/dL (ref 3.5–5.0)
Alkaline Phosphatase: 72 U/L (ref 38–126)
Anion gap: 11 (ref 5–15)
BUN: 20 mg/dL (ref 8–23)
CO2: 29 mmol/L (ref 22–32)
Calcium: 9.3 mg/dL (ref 8.9–10.3)
Chloride: 101 mmol/L (ref 98–111)
Creatinine: 1.28 mg/dL — ABNORMAL HIGH (ref 0.61–1.24)
GFR, Estimated: 55 mL/min — ABNORMAL LOW (ref >60)
Glucose, Bld: 160 mg/dL — ABNORMAL HIGH (ref 70–99)
Potassium: 4 mmol/L (ref 3.5–5.1)
Sodium: 140 mmol/L (ref 135–145)
Total Bilirubin: 0.7 mg/dL (ref 0.0–1.2)
Total Protein: 7.7 g/dL (ref 6.5–8.1)

## 2024-02-04 LAB — CBC WITH DIFFERENTIAL (CANCER CENTER ONLY)
Abs Immature Granulocytes: 0.04 K/uL (ref 0.00–0.07)
Basophils Absolute: 0 K/uL (ref 0.0–0.1)
Basophils Relative: 0 %
Eosinophils Absolute: 0 K/uL (ref 0.0–0.5)
Eosinophils Relative: 2 %
HCT: 38.1 % — ABNORMAL LOW (ref 39.0–52.0)
Hemoglobin: 13.3 g/dL (ref 13.0–17.0)
Immature Granulocytes: 2 %
Lymphocytes Relative: 37 %
Lymphs Abs: 0.9 K/uL (ref 0.7–4.0)
MCH: 34.7 pg — ABNORMAL HIGH (ref 26.0–34.0)
MCHC: 34.9 g/dL (ref 30.0–36.0)
MCV: 99.5 fL (ref 80.0–100.0)
Monocytes Absolute: 0.2 K/uL (ref 0.1–1.0)
Monocytes Relative: 6 %
Neutro Abs: 1.3 K/uL — ABNORMAL LOW (ref 1.7–7.7)
Neutrophils Relative %: 53 %
Platelet Count: 148 K/uL — ABNORMAL LOW (ref 150–400)
RBC: 3.83 MIL/uL — ABNORMAL LOW (ref 4.22–5.81)
RDW: 13.3 % (ref 11.5–15.5)
WBC Count: 2.5 K/uL — ABNORMAL LOW (ref 4.0–10.5)
nRBC: 0 % (ref 0.0–0.2)

## 2024-02-04 LAB — RETICULOCYTES
Immature Retic Fract: 11.4 % (ref 2.3–15.9)
RBC.: 3.83 MIL/uL — ABNORMAL LOW (ref 4.22–5.81)
Retic Count, Absolute: 69.7 K/uL (ref 19.0–186.0)
Retic Ct Pct: 1.8 % (ref 0.4–3.1)

## 2024-02-04 LAB — LACTATE DEHYDROGENASE: LDH: 295 U/L — ABNORMAL HIGH (ref 98–192)

## 2024-02-04 LAB — SAVE SMEAR(SSMR), FOR PROVIDER SLIDE REVIEW

## 2024-02-04 NOTE — Progress Notes (Signed)
 BP remains elevated, 172/69. Did not take BP meds this morning. Will monitor at home and if it remains over 140/90, will contact PCP.

## 2024-02-04 NOTE — Progress Notes (Signed)
 Hematology and Oncology Follow Up Visit  Trevor Cooke [id number] Jan 05, 1941 83 y.o. 02/04/2024   Principle Diagnosis:  Myelodysplastic syndrome-low-grade (IPSS-R score = 1) - tp53+  Current Therapy:   Observation     Interim History:  Trevor Cooke is back for follow-up.  We see him every 6 months.  He had no problems over the Summer or Fall.  He is not sure was good to be doing over the [place] season.  He has had no issues with nausea or vomiting.  He has had no bleeding or bruising.  He has had no fever.  He has had no cough.  He has had no rashes.  He has had no obvious change in bowel or bladder habits.  He has had no weight loss or weight gain.  He actually looks quite good.  I know that he is trying to stay active.  Overall, I would have to say that his performance status is by ECOG 1.   Medications:  Current Outpatient Medications:    anastrozole (ARIMIDEX) 1 MG tablet, Take 1 mg by mouth., Disp: , Rfl:    B Complex-Biotin-FA (B-50 COMPLEX PO), Take 1 tablet by mouth daily., Disp: , Rfl:    calcipotriene -betamethasone (TACLONEX SCALP) external suspension, Apply topically daily. For psoriasis at scalp, Disp: 60 g, Rfl: 11   Calcium Carb-Cholecalciferol (CALCIUM 500+D) 500-10 MG-MCG TABS, Take 1 tablet by mouth daily., Disp: , Rfl:    Cholecalciferol (VITAMIN D ) 2000 units CAPS, Take 2,000 Units by mouth daily., Disp: , Rfl:    desonide  (DESOWEN ) 0.05 % cream, Apply topically as directed. Qam 5 days a week to aa psoriasis on body until clear, then prn flares, Disp: 60 g, Rfl: 6   Emollient (EUCERIN) lotion, Apply topically as needed for dry skin., Disp: , Rfl:    enalapril  (VASOTEC ) 10 MG tablet, Take 1 tablet (10 mg total) by mouth daily., Disp: 90 tablet, Rfl: 3   furosemide  (LASIX ) 20 MG tablet, Take 1 tablet (20 mg total) by mouth daily., Disp: 90 tablet, Rfl: 3   glucose blood (FREESTYLE PRECISION NEO TEST) test strip, Check glucose 1 time daily dx:E11.9, Disp: 100  each, Rfl: 4   labetalol  (NORMODYNE ) 100 MG tablet, Take 1 tablet (100 mg total) by mouth 2 (two) times daily., Disp: 180 tablet, Rfl: 3   levothyroxine  (SYNTHROID ) 75 MCG tablet, Take 1 tablet (75 mcg total) by mouth daily before breakfast., Disp: 90 tablet, Rfl: 1   lovastatin  (MEVACOR ) 20 MG tablet, Take 1 tablet (20 mg total) by mouth at bedtime., Disp: 90 tablet, Rfl: 3   Multiple Vitamins-Minerals (MULTIVITAMIN WITH MINERALS) tablet, Take 1 tablet by mouth daily. CitraVit, Disp: , Rfl:    mupirocin ointment (BACTROBAN) 2 %, Apply 1 Application topically 2 (two) times daily., Disp: 30 g, Rfl: 0   NIFEdipine  (PROCARDIA  XL/NIFEDICAL XL) 60 MG 24 hr tablet, Take 1 tablet (60 mg total) by mouth daily., Disp: 90 tablet, Rfl: 3   omeprazole (PRILOSEC) 20 MG capsule, Take 1 capsule (20 mg total) by mouth 2 (two) times daily before a meal., Disp: 180 capsule, Rfl: 3   Testosterone  Enanthate (XYOSTED) 75 MG/0.5ML SOAJ, Inject into the skin. (Patient taking differently: Inject into the skin once a week.), Disp: , Rfl:    Zinc 50 MG CAPS, Take 1 capsule by mouth daily., Disp: , Rfl:    Calcipotriene  0.005 % solution, Apply to aa's psoriasis on the scalp QD-BID PRN. (Patient not taking: Reported on 02/04/2024), Disp: 60  mL, Rfl: 11   tadalafil  (CIALIS ) 20 MG tablet, Take 0.5-1 tablets (10-20 mg total) by mouth every other day as needed for erectile dysfunction. (Patient not taking: Reported on 02/04/2024), Disp: 10 tablet, Rfl: 11  Allergies: No Known Allergies  Past Medical History, Surgical history, Social history, and Family History were reviewed and updated.  Review of Systems: Review of Systems  Constitutional: Negative.   HENT: Negative.    Eyes: Negative.   Respiratory: Negative.    Cardiovascular: Negative.   Gastrointestinal: Negative.   Genitourinary: Negative.   Musculoskeletal: Negative.   Skin: Negative.   Neurological: Negative.   Endo/Heme/Allergies: Negative.    Psychiatric/Behavioral: Negative.       Physical Exam: Vital signs are temperature 97.5.  Pulse 51.  Blood pressure 172/69.  Weight is 189 pounds.    Wt Readings from Last 3 Encounters:  02/04/24 189 lb 1.9 oz (85.8 kg)  12/24/23 191 lb (86.6 kg)  11/05/23 192 lb (87.1 kg)     Physical Exam Vitals reviewed.  HENT:     Head: Normocephalic and atraumatic.  Eyes:     Pupils: Pupils are equal, round, and reactive to light.  Cardiovascular:     Rate and Rhythm: Normal rate and regular rhythm.     Heart sounds: Normal heart sounds.  Pulmonary:     Effort: Pulmonary effort is normal.     Breath sounds: Normal breath sounds.  Abdominal:     General: Bowel sounds are normal.     Palpations: Abdomen is soft.  Musculoskeletal:        General: No tenderness or deformity. Normal range of motion.     Cervical back: Normal range of motion.  Lymphadenopathy:     Cervical: No cervical adenopathy.  Skin:    General: Skin is warm and dry.     Findings: No erythema or rash.  Neurological:     Mental Status: He is alert and oriented to person, place, and time.  Psychiatric:        Behavior: Behavior normal.        Thought Content: Thought content normal.        Judgment: Judgment normal.      Lab Results  Component Value Date   WBC 2.5 (L) 02/04/2024   HGB 13.3 02/04/2024   HCT 38.1 (L) 02/04/2024   MCV 99.5 02/04/2024   PLT 148 (L) 02/04/2024     Chemistry      Component Value Date/Time   NA 140 02/04/2024 0850   NA 144 04/26/2016 1117   K 4.0 02/04/2024 0850   K 3.9 04/26/2016 1117   CL 101 02/04/2024 0850   CL 98 04/26/2016 1117   CO2 29 02/04/2024 0850   CO2 18 04/26/2016 1117   BUN 20 02/04/2024 0850   BUN 13 04/26/2016 1117   CREATININE 1.28 (H) 02/04/2024 0850   CREATININE 1.2 04/26/2016 1117      Component Value Date/Time   CALCIUM 9.3 02/04/2024 0850   CALCIUM 9.1 04/26/2016 1117   ALKPHOS 72 02/04/2024 0850   ALKPHOS 99 (H) 04/26/2016 1117   AST 32  02/04/2024 0850   ALT 29 02/04/2024 0850   ALT 22 04/26/2016 1117   BILITOT 0.7 02/04/2024 0850      Impression and Plan: Trevor Cooke is a 83 year old white male. He has myelodysplasia. This is manifested as leukopenia.   I looked at his blood smear under the microscope.  I did not see any immature myeloid cells.  He has no nucleated red blood cells.  His platelets look fine and are well granulated.  He had no blasts.  Everything really is quite stable for him.    We will still continue him on surveillance.  I will see him back in 6 months.   Maude JONELLE Crease, MD 11/10/20259:47 AM

## 2024-02-06 ENCOUNTER — Other Ambulatory Visit (HOSPITAL_BASED_OUTPATIENT_CLINIC_OR_DEPARTMENT_OTHER): Payer: Self-pay

## 2024-02-06 DIAGNOSIS — Z23 Encounter for immunization: Secondary | ICD-10-CM | POA: Diagnosis not present

## 2024-02-06 MED ORDER — FLUZONE HIGH-DOSE 0.5 ML IM SUSY
0.5000 mL | PREFILLED_SYRINGE | Freq: Once | INTRAMUSCULAR | 0 refills | Status: AC
  Filled 2024-02-06: qty 0.5, 1d supply, fill #0

## 2024-02-08 DIAGNOSIS — Z961 Presence of intraocular lens: Secondary | ICD-10-CM | POA: Diagnosis not present

## 2024-02-08 DIAGNOSIS — E119 Type 2 diabetes mellitus without complications: Secondary | ICD-10-CM | POA: Diagnosis not present

## 2024-04-16 ENCOUNTER — Encounter: Payer: Self-pay | Admitting: Dermatology

## 2024-04-16 ENCOUNTER — Ambulatory Visit: Payer: PRIVATE HEALTH INSURANCE | Admitting: Dermatology

## 2024-04-16 DIAGNOSIS — L719 Rosacea, unspecified: Secondary | ICD-10-CM

## 2024-04-16 DIAGNOSIS — L718 Other rosacea: Secondary | ICD-10-CM

## 2024-04-16 DIAGNOSIS — W908XXA Exposure to other nonionizing radiation, initial encounter: Secondary | ICD-10-CM | POA: Diagnosis not present

## 2024-04-16 DIAGNOSIS — L578 Other skin changes due to chronic exposure to nonionizing radiation: Secondary | ICD-10-CM

## 2024-04-16 DIAGNOSIS — L814 Other melanin hyperpigmentation: Secondary | ICD-10-CM

## 2024-04-16 DIAGNOSIS — D229 Melanocytic nevi, unspecified: Secondary | ICD-10-CM

## 2024-04-16 DIAGNOSIS — D1801 Hemangioma of skin and subcutaneous tissue: Secondary | ICD-10-CM

## 2024-04-16 DIAGNOSIS — L821 Other seborrheic keratosis: Secondary | ICD-10-CM | POA: Diagnosis not present

## 2024-04-16 DIAGNOSIS — L409 Psoriasis, unspecified: Secondary | ICD-10-CM

## 2024-04-16 DIAGNOSIS — Z7189 Other specified counseling: Secondary | ICD-10-CM

## 2024-04-16 DIAGNOSIS — Z1283 Encounter for screening for malignant neoplasm of skin: Secondary | ICD-10-CM | POA: Diagnosis not present

## 2024-04-16 DIAGNOSIS — L570 Actinic keratosis: Secondary | ICD-10-CM

## 2024-04-16 DIAGNOSIS — Z79899 Other long term (current) drug therapy: Secondary | ICD-10-CM

## 2024-04-16 MED ORDER — CALCIPOTRIENE 0.005 % EX SOLN: CUTANEOUS | 11 refills | Status: DC

## 2024-04-16 MED ORDER — CALCIPOTRIENE 0.005 % EX SOLN: CUTANEOUS | 11 refills | Status: AC

## 2024-04-16 MED ORDER — DESONIDE 0.05 % EX CREA: TOPICAL_CREAM | CUTANEOUS | 6 refills | Status: DC

## 2024-04-16 MED ORDER — DESONIDE 0.05 % EX CREA: TOPICAL_CREAM | CUTANEOUS | 6 refills | Status: AC

## 2024-04-16 NOTE — Progress Notes (Signed)
 "  Follow-Up Visit   Subjective  Trevor Cooke is a 84 y.o. male who presents for the following: Skin Cancer Screening and Full Body Skin Exam Hx of aks hx of psoriasis on scalp and body using calcipotriene , mometasone  lotion and desonide  cream  Hx of isks  The patient presents for Total-Body Skin Exam (TBSE) for skin cancer screening and mole check. The patient has spots, moles and lesions to be evaluated, some may be new or changing and the patient may have concern these could be cancer.  The following portions of the chart were reviewed this encounter and updated as appropriate: medications, allergies, medical history  Review of Systems:  No other skin or systemic complaints except as noted in HPI or Assessment and Plan.  Objective  Well appearing patient in no apparent distress; mood and affect are within normal limits.  A full examination was performed including scalp, head, eyes, ears, nose, lips, neck, chest, axillae, abdomen, back, buttocks, bilateral upper extremities, bilateral lower extremities, hands, feet, fingers, toes, fingernails, and toenails. All findings within normal limits unless otherwise noted below.   Relevant physical exam findings are noted in the Assessment and Plan.  right ear and face x 12 (12) Erythematous thin papules/macules with gritty scale.   Assessment & Plan   ROSACEA with ocular rosacea  Exam Mid face erythema with telangiectasias and redness at eyes Chronic and persistent condition with duration or expected duration over one year. Condition is bothersome/symptomatic for patient. Currently flared. Rosacea is a chronic progressive skin condition usually affecting the face of adults, causing redness and/or acne bumps. It is treatable but not curable. It sometimes affects the eyes (ocular rosacea) as well. It may respond to topical and/or systemic medication and can flare with stress, sun exposure, alcohol, exercise, topical steroids (including  hydrocortisone/cortisone 10) and some foods.  Daily application of broad spectrum spf 30+ sunscreen to face is recommended to reduce flares.  Patient denies grittiness of the eyes  Treatment Plan Discussed treatment options But to discussed with eye doctor  Patient declines any treatment today     PSORIASIS Scalp, body Exam: plaques on elbows, b/l knees and right thigh    7% BSA. Chronic and persistent condition with duration or expected duration over one year. Condition is symptomatic/ bothersome to patient. Not currently at goal. patient denies joint pain Psoriasis is a chronic non-curable, but treatable genetic/hereditary disease that may have other systemic features affecting other organ systems such as joints (Psoriatic Arthritis). It is associated with an increased risk of inflammatory bowel disease, heart disease, non-alcoholic fatty liver disease, and depression.  Treatments include light and laser treatments; topical medications; and systemic medications including oral and injectables. Treatment Plan: Cont Calcipotriene  sol to scalp qam can also use at plaque at right thigh and knees and then rub desonide  over top  Cont  Mometasone  lotion 3-5x/wk to aa scalp prn itch, avoid f/g/a Cont Desonide  cr qam 5d/wk to aa psoriasis on body   Topical steroids (such as triamcinolone , fluocinolone, fluocinonide , mometasone , clobetasol , halobetasol, betamethasone, hydrocortisone) can cause thinning and lightening of the skin if they are used for too long in the same area. Your physician has selected the right strength medicine for your problem and area affected on the body. Please use your medication only as directed by your physician to prevent side effects.     SKIN CANCER SCREENING PERFORMED TODAY.  ACTINIC DAMAGE - Chronic condition, secondary to cumulative UV/sun exposure - diffuse scaly erythematous macules with  underlying dyspigmentation - Recommend daily broad spectrum sunscreen  SPF 30+ to sun-exposed areas, reapply every 2 hours as needed.  - Staying in the shade or wearing long sleeves, sun glasses (UVA+UVB protection) and wide brim hats (4-inch brim around the entire circumference of the hat) are also recommended for sun protection.  - Call for new or changing lesions.  LENTIGINES, SEBORRHEIC KERATOSES, HEMANGIOMAS - Benign normal skin lesions - Benign-appearing - Call for any changes  MELANOCYTIC NEVI - Tan-brown and/or pink-flesh-colored symmetric macules and papules - Benign appearing on exam today - Observation - Call clinic for new or changing moles - Recommend daily use of broad spectrum spf 30+ sunscreen to sun-exposed areas.    ACTINIC KERATOSIS (12) right ear and face x 12 (12) Actinic keratoses are precancerous spots that appear secondary to cumulative UV radiation exposure/sun exposure over time. They are chronic with expected duration over 1 year. A portion of actinic keratoses will progress to squamous cell carcinoma of the skin. It is not possible to reliably predict which spots will progress to skin cancer and so treatment is recommended to prevent development of skin cancer.  Recommend daily broad spectrum sunscreen SPF 30+ to sun-exposed areas, reapply every 2 hours as needed.  Recommend staying in the shade or wearing long sleeves, sun glasses (UVA+UVB protection) and wide brim hats (4-inch brim around the entire circumference of the hat). Call for new or changing lesions. - Destruction of lesion - right ear and face x 12 (12) Complexity: simple   Destruction method: cryotherapy   Informed consent: discussed and consent obtained   Timeout:  patient name, date of birth, surgical site, and procedure verified Lesion destroyed using liquid nitrogen: Yes   Region frozen until ice ball extended beyond lesion: Yes   Outcome: patient tolerated procedure well with no complications   Post-procedure details: wound care instructions given     PSORIASIS   This Visit - Calcipotriene  0.005 % solution - Apply to aa's psoriasis on the scalp, knees, elbows and legs  QD-BID 7 days a week as needed for psoriasis . - desonide  (DESOWEN ) 0.05 % cream - Apply topically as directed. Apply topically Qam 5 days a week to aa psoriasis on body until clear, then prn flares Return in about 1 year (around 04/16/2025) for TBSE.  IEleanor Blush, CMA, am acting as scribe for Alm Rhyme, MD.   Documentation: I have reviewed the above documentation for accuracy and completeness, and I agree with the above.  Alm Rhyme, MD    "

## 2024-04-16 NOTE — Patient Instructions (Addendum)
 Actinic keratoses are precancerous spots that appear secondary to cumulative UV radiation exposure/sun exposure over time. They are chronic with expected duration over 1 year. A portion of actinic keratoses will progress to squamous cell carcinoma of the skin. It is not possible to reliably predict which spots will progress to skin cancer and so treatment is recommended to prevent development of skin cancer.  Recommend daily broad spectrum sunscreen SPF 30+ to sun-exposed areas, reapply every 2 hours as needed.  Recommend staying in the shade or wearing long sleeves, sun glasses (UVA+UVB protection) and wide brim hats (4-inch brim around the entire circumference of the hat). Call for new or changing lesions.     Cryotherapy Aftercare  Wash gently with soap and water everyday.   Apply Vaseline and Band-Aid daily until healed.    Seborrheic Keratosis  What causes seborrheic keratoses? Seborrheic keratoses are harmless, common skin growths that first appear during adult life.  As time goes by, more growths appear.  Some people may develop a large number of them.  Seborrheic keratoses appear on both covered and uncovered body parts.  They are not caused by sunlight.  The tendency to develop seborrheic keratoses can be inherited.  They vary in color from skin-colored to gray, brown, or even black.  They can be either smooth or have a rough, warty surface.   Seborrheic keratoses are superficial and look as if they were stuck on the skin.  Under the microscope this type of keratosis looks like layers upon layers of skin.  That is why at times the top layer may seem to fall off, but the rest of the growth remains and re-grows.    Treatment Seborrheic keratoses do not need to be treated, but can easily be removed in the office.  Seborrheic keratoses often cause symptoms when they rub on clothing or jewelry.  Lesions can be in the way of shaving.  If they become inflamed, they can cause itching, soreness,  or burning.  Removal of a seborrheic keratosis can be accomplished by freezing, burning, or surgery. If any spot bleeds, scabs, or grows rapidly, please return to have it checked, as these can be an indication of a skin cancer.   Melanoma ABCDEs  Melanoma is the most dangerous type of skin cancer, and is the leading cause of death from skin disease.  You are more likely to develop melanoma if you: Have light-colored skin, light-colored eyes, or red or blond hair Spend a lot of time in the sun Tan regularly, either outdoors or in a tanning bed Have had blistering sunburns, especially during childhood Have a close family member who has had a melanoma Have atypical moles or large birthmarks  Early detection of melanoma is key since treatment is typically straightforward and cure rates are extremely high if we catch it early.   The first sign of melanoma is often a change in a mole or a new dark spot.  The ABCDE system is a way of remembering the signs of melanoma.  A for asymmetry:  The two halves do not match. B for border:  The edges of the growth are irregular. C for color:  A mixture of colors are present instead of an even brown color. D for diameter:  Melanomas are usually (but not always) greater than 6mm - the size of a pencil eraser. E for evolution:  The spot keeps changing in size, shape, and color.  Please check your skin once per month between visits. You can  use a small mirror in front and a large mirror behind you to keep an eye on the back side or your body.   If you see any new or changing lesions before your next follow-up, please call to schedule a visit.  Please continue daily skin protection including broad spectrum sunscreen SPF 30+ to sun-exposed areas, reapplying every 2 hours as needed when you're outdoors.   Staying in the shade or wearing long sleeves, sun glasses (UVA+UVB protection) and wide brim hats (4-inch brim around the entire circumference of the hat) are  also recommended for sun protection.    Due to recent changes in healthcare laws, you may see results of your pathology and/or laboratory studies on MyChart before the doctors have had a chance to review them. We understand that in some cases there may be results that are confusing or concerning to you. Please understand that not all results are received at the same time and often the doctors may need to interpret multiple results in order to provide you with the best plan of care or course of treatment. Therefore, we ask that you please give us  2 business days to thoroughly review all your results before contacting the office for clarification. Should we see a critical lab result, you will be contacted sooner.   If You Need Anything After Your Visit  If you have any questions or concerns for your doctor, please call our main line at [phone] and press option 4 to reach your doctor's medical assistant. If no one answers, please leave a voicemail as directed and we will return your call as soon as possible. Messages left after 4 pm will be answered the following business day.   You may also send us  a message via MyChart. We typically respond to MyChart messages within 1-2 business days.  For prescription refills, please ask your pharmacy to contact our office. Our fax number is [phone].  If you have an urgent issue when the clinic is closed that cannot wait until the next business day, you can page your doctor at the number below.    Please note that while we do our best to be available for urgent issues outside of office hours, we are not available 24/7.   If you have an urgent issue and are unable to reach us , you may choose to seek medical care at your doctor's office, retail clinic, urgent care center, or emergency room.  If you have a medical emergency, please immediately call 911 or go to the emergency department.  Pager Numbers  - Dr. Hester: [phone]  - Dr. Jackquline:  [phone]  - Dr. Claudene: [phone]   - Dr. Raymund: [phone]  In the event of inclement weather, please call our main line at [phone] for an update on the status of any delays or closures.  Dermatology Medication Tips: Please keep the boxes that topical medications come in in order to help keep track of the instructions about where and how to use these. Pharmacies typically print the medication instructions only on the boxes and not directly on the medication tubes.   If your medication is too expensive, please contact our office at [phone] option 4 or send us  a message through MyChart.   We are unable to tell what your co-pay for medications will be in advance as this is different depending on your insurance coverage. However, we may be able to find a substitute medication at lower cost or fill out paperwork to get insurance to cover  a needed medication.   If a prior authorization is required to get your medication covered by your insurance company, please allow us  1-2 business days to complete this process.  Drug prices often vary depending on where the prescription is filled and some pharmacies may offer cheaper prices.  The website www.goodrx.com contains coupons for medications through different pharmacies. The prices here do not account for what the cost may be with help from insurance (it may be cheaper with your insurance), but the website can give you the price if you did not use any insurance.  - You can print the associated coupon and take it with your prescription to the pharmacy.  - You may also stop by our office during regular business hours and pick up a GoodRx coupon card.  - If you need your prescription sent electronically to a different pharmacy, notify our office through [facility] or by phone at [phone] option 4.     Si Usted Necesita Algo Despus de Su Visita  Tambin puede enviarnos un mensaje a travs de Clinical cytogeneticist. Por lo general  respondemos a los mensajes de MyChart en el transcurso de 1 a 2 das hbiles.  Para renovar recetas, por favor pida a su farmacia que se ponga en contacto con nuestra oficina. Randi lakes de fax es [place] [phone].  Si tiene un asunto urgente cuando la clnica est cerrada y que no puede esperar hasta el siguiente da hbil, puede llamar/localizar a su doctor(a) al nmero que aparece a continuacin.   Por favor, tenga en cuenta que aunque hacemos todo lo posible para estar disponibles para asuntos urgentes fuera del horario de [place], no estamos disponibles las 24 horas del da, los 7 [address]  [address] de la [place].   Si tiene un problema urgente y no puede comunicarse con nosotros, puede optar por buscar atencin mdica  en el consultorio de su doctor(a), en una clnica privada, en un centro de atencin urgente o en una sala de emergencias.  Si tiene Engineer, drilling, por favor llame inmediatamente al 911 o vaya a la sala de emergencias.  Nmeros de bper  - Dr. Hester: [phone]  - Dra. Jackquline: [phone]  - Dr. Claudene: [phone]  - Dra. Kitts: [phone]  En caso de inclemencias del [place], por favor llame a nuestra lnea principal al [phone] para una actualizacin sobre el estado de cualquier retraso o cierre.  Consejos para la medicacin en dermatologa: Por favor, guarde las cajas en las que vienen los medicamentos de uso tpico para ayudarle a seguir las instrucciones sobre dnde y cmo usarlos. Las farmacias generalmente imprimen las instrucciones del medicamento slo en las cajas y no directamente en los tubos del [place].   Si su medicamento es muy caro, por favor, pngase en contacto con landry rieger llamando al [phone] y presione la opcin 4 o envenos un mensaje a travs de Clinical cytogeneticist.   No podemos decirle cul ser su copago por los medicamentos por adelantado ya que esto es diferente dependiendo de la cobertura de su seguro. Sin embargo, es posible  que podamos encontrar un medicamento sustituto a Audiological scientist un formulario para que el seguro cubra el medicamento que se considera necesario.   Si se requiere una autorizacin previa para que su compaa de seguros malta su medicamento, por favor permtanos de 1 a 2 das hbiles para completar este proceso.  Los precios de los medicamentos varan con frecuencia dependiendo del Environmental consultant de dnde se surte la receta y iraq  pueden ofrecer precios ms baratos.  El sitio web www.goodrx.com tiene cupones para medicamentos de Health and safety inspector. Los precios aqu no tienen en cuenta lo que podra costar con la ayuda del seguro (puede ser ms barato con su seguro), pero el sitio web puede darle el precio si no utiliz Tourist information centre manager.  - Puede imprimir el cupn correspondiente y llevarlo con su receta a la farmacia.  - Tambin puede pasar por nuestra oficina durante el horario de atencin regular y Education officer, museum una tarjeta de cupones de GoodRx.  - Si necesita que su receta se enve electrnicamente a una farmacia diferente, informe a nuestra oficina a travs de MyChart de [employer] o por telfono llamando al [phone] y presione la opcin 4.

## 2024-05-05 ENCOUNTER — Ambulatory Visit

## 2024-05-08 ENCOUNTER — Ambulatory Visit: Payer: PRIVATE HEALTH INSURANCE | Admitting: Family Medicine

## 2024-05-12 ENCOUNTER — Ambulatory Visit: Payer: PRIVATE HEALTH INSURANCE | Admitting: Family Medicine

## 2024-05-19 ENCOUNTER — Ambulatory Visit: Payer: PRIVATE HEALTH INSURANCE | Admitting: Family Medicine

## 2024-08-04 ENCOUNTER — Inpatient Hospital Stay: Payer: PRIVATE HEALTH INSURANCE | Admitting: Hematology & Oncology

## 2024-08-04 ENCOUNTER — Inpatient Hospital Stay

## 2025-04-16 ENCOUNTER — Ambulatory Visit: Payer: PRIVATE HEALTH INSURANCE | Admitting: Dermatology
